# Patient Record
Sex: Male | Born: 1950 | Race: White | Hispanic: No | Smoking: Current every day smoker
Health system: Southern US, Community
[De-identification: ages and names within clinical notes are randomized; demographics above are authoritative.]

## PROBLEM LIST (undated history)

## (undated) DIAGNOSIS — Z87440 Personal history of urinary (tract) infections: Secondary | ICD-10-CM

## (undated) DIAGNOSIS — I10 Essential (primary) hypertension: Secondary | ICD-10-CM

## (undated) DIAGNOSIS — R011 Cardiac murmur, unspecified: Secondary | ICD-10-CM

## (undated) DIAGNOSIS — J189 Pneumonia, unspecified organism: Secondary | ICD-10-CM

## (undated) DIAGNOSIS — G43909 Migraine, unspecified, not intractable, without status migrainosus: Secondary | ICD-10-CM

## (undated) DIAGNOSIS — R351 Nocturia: Secondary | ICD-10-CM

## (undated) DIAGNOSIS — F1010 Alcohol abuse, uncomplicated: Secondary | ICD-10-CM

## (undated) DIAGNOSIS — J449 Chronic obstructive pulmonary disease, unspecified: Secondary | ICD-10-CM

## (undated) DIAGNOSIS — K219 Gastro-esophageal reflux disease without esophagitis: Secondary | ICD-10-CM

## (undated) DIAGNOSIS — K760 Fatty (change of) liver, not elsewhere classified: Secondary | ICD-10-CM

## (undated) DIAGNOSIS — K5792 Diverticulitis of intestine, part unspecified, without perforation or abscess without bleeding: Secondary | ICD-10-CM

## (undated) DIAGNOSIS — I4891 Unspecified atrial fibrillation: Secondary | ICD-10-CM

## (undated) DIAGNOSIS — K921 Melena: Secondary | ICD-10-CM

## (undated) DIAGNOSIS — K589 Irritable bowel syndrome without diarrhea: Secondary | ICD-10-CM

## (undated) DIAGNOSIS — Z720 Tobacco use: Secondary | ICD-10-CM

## (undated) DIAGNOSIS — I509 Heart failure, unspecified: Secondary | ICD-10-CM

## (undated) DIAGNOSIS — J302 Other seasonal allergic rhinitis: Secondary | ICD-10-CM

## (undated) DIAGNOSIS — E222 Syndrome of inappropriate secretion of antidiuretic hormone: Secondary | ICD-10-CM

## (undated) HISTORY — DX: Irritable bowel syndrome, unspecified: K58.9

## (undated) HISTORY — PX: TONSILLECTOMY: SUR1361

## (undated) HISTORY — DX: Melena: K92.1

## (undated) HISTORY — DX: Diverticulitis of intestine, part unspecified, without perforation or abscess without bleeding: K57.92

## (undated) HISTORY — DX: Essential (primary) hypertension: I10

## (undated) HISTORY — PX: COLONOSCOPY: SHX174

---

## 1996-10-28 LAB — HM COLONOSCOPY: HM Colonoscopy: NORMAL

## 2013-09-17 ENCOUNTER — Encounter: Payer: Self-pay | Admitting: Family Medicine

## 2013-09-17 ENCOUNTER — Ambulatory Visit (INDEPENDENT_AMBULATORY_CARE_PROVIDER_SITE_OTHER): Payer: 59 | Admitting: Family Medicine

## 2013-09-17 VITALS — BP 146/80 | HR 107 | Temp 98.2°F | Resp 16 | Ht 69.0 in | Wt 191.0 lb

## 2013-09-17 DIAGNOSIS — K625 Hemorrhage of anus and rectum: Secondary | ICD-10-CM

## 2013-09-17 DIAGNOSIS — R1032 Left lower quadrant pain: Secondary | ICD-10-CM

## 2013-09-17 LAB — BASIC METABOLIC PANEL
BUN: 9 mg/dL (ref 6–23)
CO2: 22 mEq/L (ref 19–32)
Calcium: 9 mg/dL (ref 8.4–10.5)
Chloride: 99 mEq/L (ref 96–112)
Creatinine, Ser: 0.8 mg/dL (ref 0.4–1.5)
Glucose, Bld: 80 mg/dL (ref 70–99)
Potassium: 3.8 mEq/L (ref 3.5–5.1)

## 2013-09-17 LAB — HEPATIC FUNCTION PANEL
ALT: 83 U/L — ABNORMAL HIGH (ref 0–53)
AST: 65 U/L — ABNORMAL HIGH (ref 0–37)
Albumin: 4.5 g/dL (ref 3.5–5.2)
Alkaline Phosphatase: 54 U/L (ref 39–117)
Bilirubin, Direct: 0 mg/dL (ref 0.0–0.3)
Total Bilirubin: 0.9 mg/dL (ref 0.3–1.2)
Total Protein: 7.8 g/dL (ref 6.0–8.3)

## 2013-09-17 LAB — CBC WITH DIFFERENTIAL/PLATELET
Basophils Absolute: 0 10*3/uL (ref 0.0–0.1)
Basophils Relative: 0.4 % (ref 0.0–3.0)
Eosinophils Absolute: 0.2 10*3/uL (ref 0.0–0.7)
HCT: 47.9 % (ref 39.0–52.0)
Hemoglobin: 16.6 g/dL (ref 13.0–17.0)
Lymphs Abs: 1.7 10*3/uL (ref 0.7–4.0)
MCHC: 34.6 g/dL (ref 30.0–36.0)
Monocytes Relative: 6.6 % (ref 3.0–12.0)
Neutro Abs: 9.6 10*3/uL — ABNORMAL HIGH (ref 1.4–7.7)
RBC: 5.13 Mil/uL (ref 4.22–5.81)
RDW: 13.6 % (ref 11.5–14.6)

## 2013-09-17 NOTE — Patient Instructions (Signed)
Schedule your complete physical in 3-6 months We'll notify you of your lab results and make any changes if needed We'll call you with your GI appt Make sure you are eating/drinking regularly Call with any questions or concerns Hang in there!!

## 2013-09-17 NOTE — Progress Notes (Signed)
  Subjective:    Patient ID: Alvin Benton, male    DOB: 11-01-1950, 62 y.o.   MRN: [id number]  HPI Pre visit review using our clinic review tool, if applicable. No additional management support is needed unless otherwise documented below in the visit note.  New to establish.  No previous MD.  IBS- pt last saw a doctor ~3 yrs ago when he had episode of IBS.  Early this week 'felt symptoms of a flare'.  Pt reports he started his 'routine'- laxative cleanse and liquid diet.  Pt noted trace blood in stool on Wednesday and yesterday BRB in bowl.  Had BM this morning w/ no blood.  No abdominal pain.  No hx of diverticulitis, Crohn's, UC.  Pt reports in 1995 he had 'bad episode but kept eating and developed massive abdominal infxn'.  No fevers.  No N/V.  + anorexia.     Review of Systems For ROS see HPI     Objective:   Physical Exam  Vitals reviewed. Constitutional: He appears well-developed and well-nourished. No distress.  HENT:  Head: Normocephalic and atraumatic.  Neck: Normal range of motion. Neck supple.  Cardiovascular: Normal rate, regular rhythm, normal heart sounds and intact distal pulses.   Pulmonary/Chest: Effort normal and breath sounds normal. No respiratory distress. He has no wheezes. He has no rales.  Abdominal: Soft. Bowel sounds are normal. He exhibits no distension. There is no tenderness. There is no rebound and no guarding.  Genitourinary: Rectal exam shows no external hemorrhoid, no internal hemorrhoid, no fissure, no mass and no tenderness. Guaiac positive stool.  Lymphadenopathy:    He has no cervical adenopathy.          Assessment & Plan:

## 2013-09-19 NOTE — Assessment & Plan Note (Signed)
New.  Heme + stool.  No evidence of hemorrhoid or fissure on PE.  Suspect diverticular bleed given pt's description of painless bleed that filled the bowl.  Refer to GI.  Reviewed supportive care and red flags that should prompt return.  Pt expressed understanding and is in agreement w/ plan.

## 2013-09-19 NOTE — Assessment & Plan Note (Signed)
New to provider, ongoing for pt.  No current sxs but did have pain earlier in the week.  Given pt's description of episodic pain, hx of requiring abx w/ improvement of pain- suspect diverticulitis.  Pt denies this but w/out records of previous GI work up.  Will get labs to assess for current infxn.  Refer to GI.  Reviewed supportive care and red flags that should prompt return.  Pt expressed understanding and is in agreement w/ plan.

## 2013-09-20 ENCOUNTER — Other Ambulatory Visit: Payer: Self-pay | Admitting: Family Medicine

## 2013-09-20 DIAGNOSIS — R945 Abnormal results of liver function studies: Secondary | ICD-10-CM

## 2013-09-28 ENCOUNTER — Encounter: Payer: Self-pay | Admitting: Internal Medicine

## 2013-09-28 ENCOUNTER — Ambulatory Visit (INDEPENDENT_AMBULATORY_CARE_PROVIDER_SITE_OTHER): Payer: 59 | Admitting: Internal Medicine

## 2013-09-28 VITALS — BP 160/100 | HR 92 | Ht 69.0 in | Wt 190.0 lb

## 2013-09-28 DIAGNOSIS — R1032 Left lower quadrant pain: Secondary | ICD-10-CM

## 2013-09-28 DIAGNOSIS — K625 Hemorrhage of anus and rectum: Secondary | ICD-10-CM

## 2013-09-28 MED ORDER — NA SULFATE-K SULFATE-MG SULF 17.5-3.13-1.6 GM/177ML PO SOLN: ORAL | Status: DC

## 2013-09-28 NOTE — Assessment & Plan Note (Addendum)
?   IBS, diverticular, abdominal migraine Unusual monthly episodes that spontaneously resolve  Await colonoscopy Chronicity speaks to benign nature though he says more frequent and with bleeding neoplasm possible Does have a history of migraines this is atypical for an abdominal migraine

## 2013-09-28 NOTE — Patient Instructions (Signed)

## 2013-09-28 NOTE — Assessment & Plan Note (Signed)
Needs colonoscopy evaluation The risks and benefits as well as alternatives of endoscopic procedure(s) have been discussed and reviewed. All questions answered. The patient agrees to proceed.

## 2013-09-28 NOTE — Progress Notes (Signed)
Referred by: Sheliah Hatch, MD   Subjective:    Patient ID: Alvin Benton, male    DOB: 01/13/1951, 62 y.o.   MRN: [id number]  HPI The patient is a very nice middle-aged white man with a long history of intermittent left lower quadrant pain. It starts is and she says he develops a feels like a blockage and he has pain and he goes on liquid diet and uses laxatives for a few days and then gets better. Sig had an episode but he passed bright red blood per rectum which is new. He says that he has tried to treat this with different laxatives etc. but is really never been successful for the most part he feels well except for these episodes. He had a colonoscopy 20 years ago in [place] as well as multiple other workups with imaging it sounds like that was unrevealing. He says her brother has a similar problem. He has been told with IBS. He's also been told it might be diverticulitis. He thinks his frequency of attacks may be increasing. He cannot identify obvious triggers. No Known Allergies No outpatient prescriptions prior to visit.   No facility-administered medications prior to visit.   Past Medical History  Diagnosis Date  . Blood in stool   . Hx of migraines   . IBS (irritable bowel syndrome)   . Diverticulitis    Past Surgical History  Procedure Laterality Date  . Colonoscopy     History   Social History  . Marital Status: Married    Spouse Name: N/A    Number of Children: 1  . Years of Education: N/A   Occupational History  . Inventory     Social History Main Topics  . Smoking status: Current Every Day Smoker -- 1.00 packs/day for 40 years    Types: Cigarettes  . Smokeless tobacco: Never Used  . Alcohol Use: No  . Drug Use: No  . Sexual Activity: No   Other Topics Concern  . None   Social History Narrative   Area and one daughter   Regulatory affairs officer at [employer]   One caffeinated beverage a day   Family History  Problem Relation Age of Onset    . Stroke Mother    Review of Systems As per history of present illness all other view of systems negative    Objective:   Physical Exam General:  Well-developed, well-nourished and in no acute distress Eyes:  anicteric. ENT:   Mouth and posterior pharynx free of lesions.  Neck:   supple w/o thyromegaly or mass.  Lungs: Clear to auscultation bilaterally. Heart:  S1S2, no rubs, murmurs, gallops. Abdomen:  soft, non-tender, no hepatosplenomegaly, hernia, or mass and BS+.  Rectal: deferred Lymph:  no cervical or supraclavicular adenopathy. Extremities:   no edema Skin   no rash. Neuro:  A&O x 3.  Psych:  appropriate mood and  Affect.   Data Reviewed: Primary care note- had heme positive stool, a 09/17/2013 Lab Results  Component Value Date   WBC 12.3* 09/17/2013   HGB 16.6 09/17/2013   HCT 47.9 09/17/2013   MCV 93.4 09/17/2013   PLT 426.0* 09/17/2013     Chemistry      Component Value Date/Time   NA 134* 09/17/2013 1334   K 3.8 09/17/2013 1334   CL 99 09/17/2013 1334   CO2 22 09/17/2013 1334   BUN 9 09/17/2013 1334   CREATININE 0.8 09/17/2013 1334      Component Value Date/Time  CALCIUM 9.0 09/17/2013 1334   ALKPHOS 54 09/17/2013 1334   AST 65* 09/17/2013 1334   ALT 83* 09/17/2013 1334   BILITOT 0.9 09/17/2013 1334        Assessment & Plan:   1. BRBPR (bright red blood per rectum)   2. LLQ pain-chronic, intermittent and episodic    I appreciate the opportunity to care for this patient. CC: Neena Rhymes, MD

## 2013-10-04 ENCOUNTER — Other Ambulatory Visit (INDEPENDENT_AMBULATORY_CARE_PROVIDER_SITE_OTHER): Payer: 59

## 2013-10-04 DIAGNOSIS — R7989 Other specified abnormal findings of blood chemistry: Secondary | ICD-10-CM

## 2013-10-04 DIAGNOSIS — R945 Abnormal results of liver function studies: Secondary | ICD-10-CM

## 2013-10-04 LAB — HEPATIC FUNCTION PANEL
Albumin: 4.2 g/dL (ref 3.5–5.2)
Alkaline Phosphatase: 43 U/L (ref 39–117)
Bilirubin, Direct: 0 mg/dL (ref 0.0–0.3)
Total Bilirubin: 0.3 mg/dL (ref 0.3–1.2)

## 2013-10-05 ENCOUNTER — Encounter: Payer: Self-pay | Admitting: *Deleted

## 2013-10-05 ENCOUNTER — Telehealth: Payer: Self-pay

## 2013-10-05 NOTE — Telephone Encounter (Signed)
Message copied by Wende Mott on Tue Oct 05, 2013  8:22 AM ------      Message from: Sheliah Hatch      Created: Mon Oct 04, 2013  4:48 PM       LFTs are improving- this is good news! ------

## 2013-10-05 NOTE — Telephone Encounter (Signed)
Opened in error

## 2013-11-09 ENCOUNTER — Ambulatory Visit: Payer: 59 | Admitting: Internal Medicine

## 2013-11-15 ENCOUNTER — Ambulatory Visit (AMBULATORY_SURGERY_CENTER): Payer: 59 | Admitting: Internal Medicine

## 2013-11-15 ENCOUNTER — Encounter: Payer: Self-pay | Admitting: Internal Medicine

## 2013-11-15 VITALS — BP 156/84 | HR 76 | Temp 98.8°F | Resp 18 | Ht 69.0 in | Wt 190.0 lb

## 2013-11-15 DIAGNOSIS — K625 Hemorrhage of anus and rectum: Secondary | ICD-10-CM

## 2013-11-15 DIAGNOSIS — K648 Other hemorrhoids: Secondary | ICD-10-CM

## 2013-11-15 DIAGNOSIS — R1032 Left lower quadrant pain: Secondary | ICD-10-CM

## 2013-11-15 DIAGNOSIS — K5730 Diverticulosis of large intestine without perforation or abscess without bleeding: Secondary | ICD-10-CM

## 2013-11-15 MED ORDER — HYOSCYAMINE SULFATE 0.125 MG SL SUBL: 0.1250 mg | SUBLINGUAL_TABLET | SUBLINGUAL | Status: DC | PRN

## 2013-11-15 MED ORDER — SODIUM CHLORIDE 0.9 % IV SOLN: 500.0000 mL | INTRAVENOUS | Status: DC

## 2013-11-15 NOTE — Progress Notes (Signed)
BP elevated.  Pt evaluated per Azucena KubaJ Nulty CRNA and ok for procedure

## 2013-11-15 NOTE — Progress Notes (Signed)
No complaints noted in the recovery room. Maw   

## 2013-11-15 NOTE — Patient Instructions (Addendum)
I found diverticulosis and small internal hemorrhoids. Otherwise ok.  Please try the hyoscyamine I have prescribed if/when you get any more pain attacks (relaxes colon spasms).  If you have new or persistent problems see me again.  Please follow-up with Dr. Beverely Low re: blood pressure.  Next routine colonoscopy in 10 years - 2025  I appreciate the opportunity to care for you. Iva Boop, MD, FACG     YOU HAD AN ENDOSCOPIC PROCEDURE TODAY AT THE [facility]: Refer to the procedure report that was given to you for any specific questions about what was found during the examination.  If the procedure report does not answer your questions, please call your gastroenterologist to clarify.  If you requested that your care partner not be given the details of your procedure findings, then the procedure report has been included in a sealed envelope for you to review at your convenience later.  YOU SHOULD EXPECT: Some feelings of bloating in the abdomen. Passage of more gas than usual.  Walking can help get rid of the air that was put into your GI tract during the procedure and reduce the bloating. If you had a lower endoscopy (such as a colonoscopy or flexible sigmoidoscopy) you may notice spotting of blood in your stool or on the toilet paper. If you underwent a bowel prep for your procedure, then you may not have a normal bowel movement for a few days.  DIET: Your first meal following the procedure should be a light meal and then it is ok to progress to your normal diet.  A half-sandwich or bowl of soup is an example of a good first meal.  Heavy or fried foods are harder to digest and may make you feel nauseous or bloated.  Likewise meals heavy in dairy and vegetables can cause extra gas to form and this can also increase the bloating.  Drink plenty of fluids but you should avoid alcoholic beverages for 24 hours.  ACTIVITY: Your care partner should take you home directly after the  procedure.  You should plan to take it easy, moving slowly for the rest of the day.  You can resume normal activity the day after the procedure however you should NOT DRIVE or use heavy machinery for 24 hours (because of the sedation medicines used during the test).    SYMPTOMS TO REPORT IMMEDIATELY: A gastroenterologist can be reached at any hour.  During normal business hours, 8:30 AM to 5:00 PM Monday through Friday, call [phone].  After hours and on weekends, please call the GI answering service at [phone] who will take a message and have the physician on call contact you.   Following lower endoscopy (colonoscopy or flexible sigmoidoscopy):  Excessive amounts of blood in the stool  Significant tenderness or worsening of abdominal pains  Swelling of the abdomen that is new, acute  Fever of 100F or higher    FOLLOW UP: If any biopsies were taken you will be contacted by phone or by letter within the next 1-3 weeks.  Call your gastroenterologist if you have not heard about the biopsies in 3 weeks.  Our staff will call the home number listed on your records the next business day following your procedure to check on you and address any questions or concerns that you may have at that time regarding the information given to you following your procedure. This is a courtesy call and so if there is no answer at the home number and  we have not heard from you through the emergency physician on call, we will assume that you have returned to your regular daily activities without incident.  SIGNATURES/CONFIDENTIALITY: You and/or your care partner have signed paperwork which will be entered into your electronic medical record.  These signatures attest to the fact that that the information above on your After Visit Summary has been reviewed and is understood.  Full responsibility of the confidentiality of this discharge information lies with you and/or your care-partner.    Handouts were  given to your care partner on diverticulosis and hemorrhoids. You may resume your current medications today. Please call if any questions or concerns.

## 2013-11-15 NOTE — Progress Notes (Signed)
A/ox3 pleased with MAC, report to Annette RN 

## 2013-11-15 NOTE — Progress Notes (Signed)
I gave the pt a printed list of vital signs to show his primary care md, Dr. Beverely Lowabori. maw

## 2013-11-15 NOTE — Op Note (Signed)
[facility] [address] [place], [id number]   COLONOSCOPY PROCEDURE REPORT  PATIENT: Alvin Benton, Mutasim  MR#: [id number] BIRTHDATE: 1951/09/02 , 62  yrs. old GENDER: Male ENDOSCOPIST: Iva Booparl E Gessner, MD, [facility] REFERRED XB:JYNWGNFAOBY:Katherine Beverely Lowabori, M.D. PROCEDURE DATE:  11/15/2013 PROCEDURE:   Colonoscopy, diagnostic First Screening Colonoscopy - Avg.  risk and is 50 yrs.  old or older - No.  Prior Negative Screening - Now for repeat screening. N/A  History of Adenoma - Now for follow-up colonoscopy & has been > or = to 3 yrs.  N/A  Polyps Removed Today? No.  Recommend repeat exam, <10 yrs? No. ASA CLASS:   Class II INDICATIONS:Rectal Bleeding. MEDICATIONS: Propofol (Diprivan) 370 mg IV, MAC sedation, administered by CRNA, and These medications were titrated to patient response per physician's verbal order  DESCRIPTION OF PROCEDURE:   After the risks benefits and alternatives of the procedure were thoroughly explained, informed consent was obtained.  A digital rectal exam revealed no abnormalities of the rectum.   The LB [id number]  endoscope was introduced through the anus and advanced to the terminal ileum which was intubated for a short distance. No adverse events experienced.   The quality of the prep was Suprep adequate  The instrument was then slowly withdrawn as the colon was fully examined.  COLON FINDINGS: Moderate diverticulosis was noted in the sigmoid colon.   The colon mucosa was otherwise normal.   The mucosa appeared normal in the terminal ileum.  Retroflexed views revealed internal hemorrhoids. The time to cecum=1 minutes 59 seconds. Withdrawal time=9 minutes 35 seconds.  The scope was withdrawn and the procedure completed. COMPLICATIONS: There were no complications.  ENDOSCOPIC IMPRESSION: 1.   Moderate diverticulosis was noted in the sigmoid colon 2.   The colon mucosa was otherwise normal 3.   Normal mucosa in the terminal ileum 4.   Small  internal hemorrhoids - suspected source of bleeding RECOMMENDATIONS: 1.  Repeat colonoscopy 10 years. 2.   hyoscyamine 0.125 ng as needed for LLQ pain Rx sent =- see Dr. Raul DelGessnera agin if recurrent/persistent bleedin/pain problems 3.   f/u PCP re: elevated BP today (? pre-procedure anxiety vs. underlyig  hypertension)   eSigned:  Iva Booparl E Gessner, MD, [facility] 11/15/2013 1:54 PM  cc: Neena Rhymesabori, Katherine MD and The Patient

## 2013-11-16 ENCOUNTER — Telehealth: Payer: Self-pay | Admitting: *Deleted

## 2013-11-16 NOTE — Telephone Encounter (Signed)
  Follow up Call-  Call back number 11/15/2013 11/15/2013  Post procedure Call Back phone  # [phone]  Permission to leave phone message Yes Yes     Patient questions:  Do you have a fever, pain , or abdominal swelling? no Pain Score  0 *  Have you tolerated food without any problems? yes  Have you been able to return to your normal activities? yes  Do you have any questions about your discharge instructions: Diet   no Medications  no Follow up visit  no  Do you have questions or concerns about your Care? no  Actions: * If pain score is 4 or above: No action needed, pain <4.

## 2013-11-25 ENCOUNTER — Ambulatory Visit: Payer: Self-pay | Admitting: Family Medicine

## 2014-01-11 ENCOUNTER — Telehealth: Payer: Self-pay

## 2014-01-11 NOTE — Telephone Encounter (Signed)
Medication List and allergies:  Updated and Reviewed  90 day supply/mail order: n/a Local prescriptions:  [id number] [place], [place] - [address]  Immunization due:  Influenza-declined, Td-DUE, Zoster-DUE  A/P: No changes to personal, family or PSH Flu-declined Tdap- greater than 10 years ago Shingles- DUE [facility]CS- 11/15/13-diverticulosis, internal hemorrhoids; repeat in 10 years 10/2023 PSA- not on file   To discuss with provider: Zoster vaccine

## 2014-01-12 ENCOUNTER — Ambulatory Visit (INDEPENDENT_AMBULATORY_CARE_PROVIDER_SITE_OTHER): Payer: 59 | Admitting: Family Medicine

## 2014-01-12 ENCOUNTER — Encounter: Payer: Self-pay | Admitting: Family Medicine

## 2014-01-12 VITALS — BP 152/100 | HR 77 | Temp 98.2°F | Resp 16 | Ht 69.0 in | Wt 197.0 lb

## 2014-01-12 DIAGNOSIS — M79642 Pain in left hand: Secondary | ICD-10-CM | POA: Insufficient documentation

## 2014-01-12 DIAGNOSIS — I10 Essential (primary) hypertension: Secondary | ICD-10-CM

## 2014-01-12 DIAGNOSIS — M79609 Pain in unspecified limb: Secondary | ICD-10-CM

## 2014-01-12 MED ORDER — VALSARTAN 80 MG PO TABS: 80.0000 mg | ORAL_TABLET | Freq: Every day | ORAL | Status: DC

## 2014-01-12 MED ORDER — MELOXICAM 15 MG PO TABS: 15.0000 mg | ORAL_TABLET | Freq: Every day | ORAL | Status: DC

## 2014-01-12 NOTE — Patient Instructions (Signed)
Follow up in 3-5 weeks to recheck BP Start the Valsartan daily for blood pressure We'll call you with your hand specialist appt We'll notify you of your lab results and make any changes if needed Start the Mobic once daily for the pain- don't add any additional ibuprofen (you can take tylenol as needed) Call with any questions or concerns Hang in there!

## 2014-01-12 NOTE — Assessment & Plan Note (Signed)
New.  Concern that pt tore his L 4th finger flexor tendon.  Refer to hand specialist.  Start Mobic daily.  Will follow.

## 2014-01-12 NOTE — Progress Notes (Signed)
   Subjective:    Patient ID: Alvin Benton, male    DOB: 04-19-1951, 63 y.o.   MRN: [id number]  HPI HTN-  Pt's BP was elevated at 1st OV and again at colonoscopy.  Pt reports he was previously on Toprol and was unable to afford med b/c it was not generic at the time.  They almost refused the colonoscopy due to elevated BP.  + family hx of HTN (mom).  + tobacco use.  Pt has limited his beer intake.  L hand pain- [place] airport [place] and broke fall w/ L hand and now has 4th finger pain and index MCP joint pain.  Unable to fully flex 4th finger and having constant aching in the knuckle of index finger.  Taking ibuprofen and tylenol w/o relief.  Icing w/ temporary improvement.   Review of Systems For ROS see HPI     Objective:   Physical Exam  Vitals reviewed. Constitutional: He is oriented to person, place, and time. He appears well-developed and well-nourished. No distress.  HENT:  Head: Normocephalic and atraumatic.  Eyes: Conjunctivae and EOM are normal. Pupils are equal, round, and reactive to light.  Neck: Normal range of motion. Neck supple. No thyromegaly present.  Cardiovascular: Normal rate, regular rhythm, normal heart sounds and intact distal pulses.   No murmur heard. Pulmonary/Chest: Effort normal and breath sounds normal. No respiratory distress.  Abdominal: Soft. Bowel sounds are normal. He exhibits no distension.  Musculoskeletal: He exhibits no edema.  Decreased flexion of L 4th finger w/ palpable soft tissue mass just inferior to PIP joint  Lymphadenopathy:    He has no cervical adenopathy.  Neurological: He is alert and oriented to person, place, and time. No cranial nerve deficit.  Tremor of L hand  Skin: Skin is warm and dry.  Psychiatric: He has a normal mood and affect. His behavior is normal.          Assessment & Plan:

## 2014-01-12 NOTE — Progress Notes (Signed)
Pre visit review using our clinic review tool, if applicable. No additional management support is needed unless otherwise documented below in the visit note. 

## 2014-01-12 NOTE — Assessment & Plan Note (Signed)
New.  Pt's BP has been elevated on multiple occasions and discovered that he has previously been on medication.  Start ARB.  Get baseline labs and follow closely.  Reviewed the importance of smoking cessation, limiting alcohol, and regular exercise.

## 2014-01-13 ENCOUNTER — Encounter: Payer: Self-pay | Admitting: General Practice

## 2014-01-13 ENCOUNTER — Telehealth: Payer: Self-pay | Admitting: Family Medicine

## 2014-01-13 LAB — BASIC METABOLIC PANEL
BUN: 10 mg/dL (ref 6–23)
CALCIUM: 9.4 mg/dL (ref 8.4–10.5)
CO2: 25 mEq/L (ref 19–32)
Chloride: 101 mEq/L (ref 96–112)
Creatinine, Ser: 1 mg/dL (ref 0.4–1.5)
GFR: 84.66 mL/min (ref >60.00)
Glucose, Bld: 73 mg/dL (ref 70–99)
Potassium: 4.2 mEq/L (ref 3.5–5.1)
Sodium: 138 mEq/L (ref 135–145)

## 2014-01-13 LAB — CBC WITH DIFFERENTIAL/PLATELET
BASOS ABS: 0 10*3/uL (ref 0.0–0.1)
Basophils Relative: 0.3 % (ref 0.0–3.0)
EOS PCT: 2.1 % (ref 0.0–5.0)
Eosinophils Absolute: 0.2 10*3/uL (ref 0.0–0.7)
HCT: 49.2 % (ref 39.0–52.0)
HEMOGLOBIN: 16.3 g/dL (ref 13.0–17.0)
LYMPHS PCT: 19.1 % (ref 12.0–46.0)
Lymphs Abs: 1.7 10*3/uL (ref 0.7–4.0)
MCHC: 33.1 g/dL (ref 30.0–36.0)
MCV: 95.9 fl (ref 78.0–100.0)
Monocytes Absolute: 0.5 10*3/uL (ref 0.1–1.0)
Monocytes Relative: 5.5 % (ref 3.0–12.0)
Neutro Abs: 6.3 10*3/uL (ref 1.4–7.7)
Neutrophils Relative %: 73 % (ref 43.0–77.0)
Platelets: 391 10*3/uL (ref 150.0–400.0)
RBC: 5.13 Mil/uL (ref 4.22–5.81)
RDW: 13.5 % (ref 11.5–14.6)
WBC: 8.7 10*3/uL (ref 4.5–10.5)

## 2014-01-13 LAB — HEPATIC FUNCTION PANEL
ALBUMIN: 4.3 g/dL (ref 3.5–5.2)
ALK PHOS: 41 U/L (ref 39–117)
ALT: 47 U/L (ref 0–53)
AST: 38 U/L — ABNORMAL HIGH (ref 0–37)
Bilirubin, Direct: 0 mg/dL (ref 0.0–0.3)
TOTAL PROTEIN: 7.1 g/dL (ref 6.0–8.3)
Total Bilirubin: 0.5 mg/dL (ref 0.3–1.2)

## 2014-01-13 LAB — TSH: TSH: 2.09 u[IU]/mL (ref 0.35–5.50)

## 2014-01-13 NOTE — Telephone Encounter (Signed)
Relevant patient education assigned to patient using Emmi. ° °

## 2014-02-09 ENCOUNTER — Ambulatory Visit (INDEPENDENT_AMBULATORY_CARE_PROVIDER_SITE_OTHER): Payer: 59 | Admitting: Family Medicine

## 2014-02-09 ENCOUNTER — Encounter: Payer: Self-pay | Admitting: Family Medicine

## 2014-02-09 VITALS — BP 160/90 | HR 71 | Temp 98.2°F | Resp 16 | Wt 199.0 lb

## 2014-02-09 DIAGNOSIS — I10 Essential (primary) hypertension: Secondary | ICD-10-CM

## 2014-02-09 LAB — BASIC METABOLIC PANEL
BUN: 10 mg/dL (ref 6–23)
CHLORIDE: 100 meq/L (ref 96–112)
CO2: 27 meq/L (ref 19–32)
CREATININE: 0.9 mg/dL (ref 0.4–1.5)
Calcium: 9.5 mg/dL (ref 8.4–10.5)
GFR: 86.72 mL/min (ref >60.00)
Glucose, Bld: 69 mg/dL — ABNORMAL LOW (ref 70–99)
Potassium: 3.6 mEq/L (ref 3.5–5.1)
Sodium: 136 mEq/L (ref 135–145)

## 2014-02-09 MED ORDER — VALSARTAN 160 MG PO TABS: 160.0000 mg | ORAL_TABLET | Freq: Every day | ORAL | Status: DC

## 2014-02-09 NOTE — Progress Notes (Signed)
   Subjective:    Patient ID: Roselee NovaScott Marcantel, male    DOB: [birth year], 63 y.o.   MRN: [id number]  HPI HTN- chronic problem.  Started Diovan at last visit.  DBP has improved but SBP is higher than previous.  Denies increased stress prior to visit.  No CP, SOB, HAs, visual changes, edema.   Review of Systems For ROS see HPI     Objective:   Physical Exam  Constitutional: He is oriented to person, place, and time. He appears well-developed and well-nourished. No distress.  HENT:  Head: Normocephalic and atraumatic.  Eyes: Conjunctivae and EOM are normal. Pupils are equal, round, and reactive to light.  Neck: Normal range of motion. Neck supple. No thyromegaly present.  Cardiovascular: Normal rate, regular rhythm and intact distal pulses.   Murmur (I/VI SEM heard best at RUSB) heard. Pulmonary/Chest: Effort normal and breath sounds normal. No respiratory distress.  Abdominal: Soft. Bowel sounds are normal. He exhibits no distension.  Musculoskeletal: He exhibits no edema.  Lymphadenopathy:    He has no cervical adenopathy.  Neurological: He is alert and oriented to person, place, and time. No cranial nerve deficit.  Skin: Skin is warm and dry.  Psychiatric: He has a normal mood and affect. His behavior is normal.          Assessment & Plan:

## 2014-02-09 NOTE — Assessment & Plan Note (Signed)
Unchanged.  Increase dose of Diovan. Check BMP.  Reviewed supportive care and red flags that should prompt return.  Pt expressed understanding and is in agreement w/ plan.

## 2014-02-09 NOTE — Progress Notes (Signed)
Pre visit review using our clinic review tool, if applicable. No additional management support is needed unless otherwise documented below in the visit note. 

## 2014-02-09 NOTE — Patient Instructions (Signed)
Recheck BP in 6 weeks We'll notify you of your lab results Increase to 160mg  daily- 2 of what you have at home and 1 of the new prescription Call with any questions or concerns Happy Early Birthday!!

## 2014-02-10 ENCOUNTER — Encounter: Payer: Self-pay | Admitting: General Practice

## 2014-03-23 ENCOUNTER — Encounter: Payer: Self-pay | Admitting: Family Medicine

## 2014-03-23 ENCOUNTER — Ambulatory Visit (INDEPENDENT_AMBULATORY_CARE_PROVIDER_SITE_OTHER): Payer: 59 | Admitting: Family Medicine

## 2014-03-23 ENCOUNTER — Encounter: Payer: Self-pay | Admitting: General Practice

## 2014-03-23 VITALS — BP 140/90 | HR 78 | Temp 98.0°F | Resp 16 | Wt 197.4 lb

## 2014-03-23 DIAGNOSIS — I10 Essential (primary) hypertension: Secondary | ICD-10-CM

## 2014-03-23 LAB — BASIC METABOLIC PANEL
BUN: 12 mg/dL (ref 6–23)
CO2: 25 mEq/L (ref 19–32)
Calcium: 9.6 mg/dL (ref 8.4–10.5)
Chloride: 103 mEq/L (ref 96–112)
Creatinine, Ser: 1 mg/dL (ref 0.4–1.5)
GFR: 82.62 mL/min (ref >60.00)
GLUCOSE: 112 mg/dL — AB (ref 70–99)
Potassium: 4.4 mEq/L (ref 3.5–5.1)
Sodium: 136 mEq/L (ref 135–145)

## 2014-03-23 NOTE — Assessment & Plan Note (Signed)
Improved since last visit w/ increased dose of Diovan.  Continue current dose w/ close monitoring.  Again encouraged pt to stop smoking.  Check BMP.  Will follow.

## 2014-03-23 NOTE — Progress Notes (Signed)
Pre visit review using our clinic review tool, if applicable. No additional management support is needed unless otherwise documented below in the visit note. 

## 2014-03-23 NOTE — Progress Notes (Signed)
   Subjective:    Patient ID: Alvin Benton, male    DOB: Sep 18, 1951, 63 y.o.   MRN: [id number]  HPI HTN- chronic problem, now on 160mg  of Diovan.  No CP, SOB, HAs, visual changes, edema.   Review of Systems For ROS see HPI     Objective:   Physical Exam  Constitutional: He is oriented to person, place, and time. He appears well-developed and well-nourished. No distress.  HENT:  Head: Normocephalic and atraumatic.  Eyes: Conjunctivae and EOM are normal. Pupils are equal, round, and reactive to light.  Neck: Normal range of motion. Neck supple. No thyromegaly present.  Cardiovascular: Normal rate, regular rhythm, normal heart sounds and intact distal pulses.   No murmur heard. Pulmonary/Chest: Effort normal and breath sounds normal. No respiratory distress.  Abdominal: Soft. Bowel sounds are normal. He exhibits no distension.  Musculoskeletal: He exhibits no edema.  Lymphadenopathy:    He has no cervical adenopathy.  Neurological: He is alert and oriented to person, place, and time. No cranial nerve deficit.  Skin: Skin is warm and dry.  Psychiatric: He has a normal mood and affect. His behavior is normal.          Assessment & Plan:

## 2014-03-23 NOTE — Patient Instructions (Signed)
Please schedule your complete physical in 6 months Continue the Diovan 160mg  daily We'll notify you of your lab results and make any changes if needed Call with any questions or concerns Have a great summer!!!

## 2014-06-16 ENCOUNTER — Other Ambulatory Visit: Payer: Self-pay | Admitting: Family Medicine

## 2014-06-16 NOTE — Telephone Encounter (Signed)
Med filled.  

## 2014-06-21 ENCOUNTER — Other Ambulatory Visit: Payer: Self-pay | Admitting: Family Medicine

## 2014-06-21 NOTE — Telephone Encounter (Signed)
Med filled CPE in November.  

## 2014-07-27 ENCOUNTER — Other Ambulatory Visit: Payer: Self-pay | Admitting: General Practice

## 2014-07-27 MED ORDER — VALSARTAN 160 MG PO TABS: ORAL_TABLET | ORAL | Status: DC

## 2014-08-01 ENCOUNTER — Other Ambulatory Visit: Payer: Self-pay | Admitting: General Practice

## 2014-08-01 MED ORDER — VALSARTAN 160 MG PO TABS: ORAL_TABLET | ORAL | Status: DC

## 2014-09-26 ENCOUNTER — Encounter: Payer: 59 | Admitting: Family Medicine

## 2014-11-04 ENCOUNTER — Telehealth: Payer: Self-pay | Admitting: Family Medicine

## 2014-11-04 MED ORDER — VALSARTAN 160 MG PO TABS: ORAL_TABLET | ORAL | Status: DC

## 2014-11-04 NOTE — Telephone Encounter (Signed)
Caller name: Roselee NovaSarver, Tayshaun Relation to pt: self  Call back number: [phone] [facility]  [id number] [place] - [address] [phone] (Phone)   Reason for call:   Pt is requesting a refill for valsartan (DIOVAN) 160 MG tablet. Insurance has changed [employer] Member ID # [id number]  Group# [id number]

## 2014-11-04 NOTE — Telephone Encounter (Signed)
Med filled. Letter mailed to pt to schedule a CPE.  

## 2014-11-17 ENCOUNTER — Other Ambulatory Visit: Payer: Self-pay | Admitting: General Practice

## 2014-11-17 MED ORDER — VALSARTAN 160 MG PO TABS: ORAL_TABLET | ORAL | Status: DC

## 2015-02-02 ENCOUNTER — Telehealth: Payer: Self-pay | Admitting: Family Medicine

## 2015-02-02 NOTE — Telephone Encounter (Signed)
Pre Visit letter sent  °

## 2015-02-22 ENCOUNTER — Telehealth: Payer: Self-pay

## 2015-02-22 NOTE — Telephone Encounter (Signed)
Attempted pre visit call. Patient had to cancel due to being [place] of [place] on business

## 2015-02-23 ENCOUNTER — Encounter: Payer: Self-pay | Admitting: Family Medicine

## 2015-04-11 ENCOUNTER — Other Ambulatory Visit: Payer: Self-pay | Admitting: Family Medicine

## 2015-04-11 NOTE — Telephone Encounter (Signed)
Med filled for only #30. Pt needs a BP follow up.  

## 2015-05-09 ENCOUNTER — Other Ambulatory Visit: Payer: Self-pay | Admitting: Family Medicine

## 2015-05-09 NOTE — Telephone Encounter (Signed)
Last OV 03-13-14 Med denied, pt needs BP follow up appt.

## 2015-05-16 ENCOUNTER — Other Ambulatory Visit: Payer: Self-pay | Admitting: Family Medicine

## 2015-05-16 NOTE — Telephone Encounter (Signed)
Med denied, pt needs a BP follow up.

## 2015-05-20 ENCOUNTER — Other Ambulatory Visit: Payer: Self-pay | Admitting: Family Medicine

## 2015-05-22 NOTE — Telephone Encounter (Signed)
30 day supply of valsartan e-scribed to [facility]. Pt needs OV for any future refills. JG//CMA

## 2015-06-15 ENCOUNTER — Other Ambulatory Visit: Payer: Self-pay | Admitting: Family Medicine

## 2015-06-15 NOTE — Telephone Encounter (Signed)
Last OV 03/23/14 BP meds last filled 05/22/15 #30 with 0  Note left on prescription to make BP follow up, and letter mailed on 6/24/6 to make BP appt. NO UPCOMING APPTS   Please advise?

## 2015-06-19 ENCOUNTER — Other Ambulatory Visit: Payer: Self-pay | Admitting: Family Medicine

## 2015-06-20 NOTE — Telephone Encounter (Signed)
Med denied, pt needs a BP follow up appt before refills can be given.

## 2015-07-06 ENCOUNTER — Telehealth: Payer: Self-pay | Admitting: Family Medicine

## 2015-07-06 MED ORDER — VALSARTAN 160 MG PO TABS: 160.0000 mg | ORAL_TABLET | Freq: Every day | ORAL | Status: DC

## 2015-07-06 NOTE — Telephone Encounter (Signed)
Medication filled to pharmacy as requested.   

## 2015-07-06 NOTE — Telephone Encounter (Signed)
[facility] [address]  Pt has been out of valsartan for 3 days. He has appt scheduled 07/17/15. Please call in for him til appt.

## 2015-07-17 ENCOUNTER — Encounter: Payer: Self-pay | Admitting: Family Medicine

## 2015-07-17 ENCOUNTER — Ambulatory Visit (INDEPENDENT_AMBULATORY_CARE_PROVIDER_SITE_OTHER): Payer: BLUE CROSS/BLUE SHIELD | Admitting: Family Medicine

## 2015-07-17 VITALS — BP 138/88 | HR 98 | Temp 98.4°F | Resp 17 | Ht 69.0 in | Wt 195.0 lb

## 2015-07-17 DIAGNOSIS — I10 Essential (primary) hypertension: Secondary | ICD-10-CM | POA: Diagnosis not present

## 2015-07-17 DIAGNOSIS — J302 Other seasonal allergic rhinitis: Secondary | ICD-10-CM

## 2015-07-17 DIAGNOSIS — E663 Overweight: Secondary | ICD-10-CM

## 2015-07-17 DIAGNOSIS — Z720 Tobacco use: Secondary | ICD-10-CM | POA: Diagnosis not present

## 2015-07-17 DIAGNOSIS — J4520 Mild intermittent asthma, uncomplicated: Secondary | ICD-10-CM | POA: Diagnosis not present

## 2015-07-17 DIAGNOSIS — J45909 Unspecified asthma, uncomplicated: Secondary | ICD-10-CM | POA: Insufficient documentation

## 2015-07-17 DIAGNOSIS — F17200 Nicotine dependence, unspecified, uncomplicated: Secondary | ICD-10-CM | POA: Insufficient documentation

## 2015-07-17 DIAGNOSIS — J309 Allergic rhinitis, unspecified: Secondary | ICD-10-CM | POA: Insufficient documentation

## 2015-07-17 LAB — CBC WITH DIFFERENTIAL/PLATELET
BASOS PCT: 0.5 % (ref 0.0–3.0)
Basophils Absolute: 0.1 10*3/uL (ref 0.0–0.1)
EOS ABS: 0.1 10*3/uL (ref 0.0–0.7)
Eosinophils Relative: 1 % (ref 0.0–5.0)
HCT: 47.4 % (ref 39.0–52.0)
Hemoglobin: 16.1 g/dL (ref 13.0–17.0)
Lymphocytes Relative: 13.8 % (ref 12.0–46.0)
Lymphs Abs: 1.4 10*3/uL (ref 0.7–4.0)
MCHC: 33.9 g/dL (ref 30.0–36.0)
MCV: 98.6 fl (ref 78.0–100.0)
MONO ABS: 1 10*3/uL (ref 0.1–1.0)
Monocytes Relative: 9.7 % (ref 3.0–12.0)
NEUTROS ABS: 7.8 10*3/uL — AB (ref 1.4–7.7)
Neutrophils Relative %: 75 % (ref 43.0–77.0)
PLATELETS: 337 10*3/uL (ref 150.0–400.0)
RBC: 4.81 Mil/uL (ref 4.22–5.81)
RDW: 14.3 % (ref 11.5–15.5)
WBC: 10.4 10*3/uL (ref 4.0–10.5)

## 2015-07-17 LAB — HEPATIC FUNCTION PANEL
ALK PHOS: 46 U/L (ref 39–117)
ALT: 65 U/L — AB (ref 0–53)
AST: 54 U/L — ABNORMAL HIGH (ref 0–37)
Albumin: 4.4 g/dL (ref 3.5–5.2)
BILIRUBIN DIRECT: 0.1 mg/dL (ref 0.0–0.3)
Total Bilirubin: 0.4 mg/dL (ref 0.2–1.2)
Total Protein: 7.8 g/dL (ref 6.0–8.3)

## 2015-07-17 LAB — LIPID PANEL
Cholesterol: 213 mg/dL — ABNORMAL HIGH (ref 0–200)
HDL: 90.4 mg/dL (ref >39.00)
LDL Cholesterol: 105 mg/dL — ABNORMAL HIGH (ref 0–99)
NONHDL: 122.53
Total CHOL/HDL Ratio: 2
Triglycerides: 88 mg/dL (ref 0.0–149.0)
VLDL: 17.6 mg/dL (ref 0.0–40.0)

## 2015-07-17 LAB — BASIC METABOLIC PANEL
BUN: 11 mg/dL (ref 6–23)
CHLORIDE: 101 meq/L (ref 96–112)
CO2: 25 mEq/L (ref 19–32)
Calcium: 9.3 mg/dL (ref 8.4–10.5)
Creatinine, Ser: 0.79 mg/dL (ref 0.40–1.50)
GFR: 105.49 mL/min (ref >60.00)
Glucose, Bld: 87 mg/dL (ref 70–99)
Potassium: 4 mEq/L (ref 3.5–5.1)
SODIUM: 139 meq/L (ref 135–145)

## 2015-07-17 LAB — TSH: TSH: 1.59 u[IU]/mL (ref 0.35–4.50)

## 2015-07-17 MED ORDER — MOMETASONE FUROATE 50 MCG/ACT NA SUSP: 2.0000 | Freq: Every day | NASAL | Status: DC

## 2015-07-17 MED ORDER — ALBUTEROL SULFATE HFA 108 (90 BASE) MCG/ACT IN AERS: 2.0000 | INHALATION_SPRAY | Freq: Four times a day (QID) | RESPIRATORY_TRACT | Status: DC | PRN

## 2015-07-17 NOTE — Assessment & Plan Note (Signed)
New.  Suspect this is a combination of tobacco abuse and untreated seasonal allergies.  Encouraged use of daily allergy meds.  Pt to use albuterol as needed for shortness of breath and/or wheezing but if he finds himself using the inhaler more than once daily, he is to call so we can adjust his tx plan.  Will follow as pt may require COPD work up.

## 2015-07-17 NOTE — Progress Notes (Signed)
Pre visit review using our clinic review tool, if applicable. No additional management support is needed unless otherwise documented below in the visit note. 

## 2015-07-17 NOTE — Assessment & Plan Note (Signed)
Chronic problem.  Adequate but not good control on current meds.  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  Asymptomatic.  Check labs.  No anticipated med changes.  Will follow.

## 2015-07-17 NOTE — Assessment & Plan Note (Signed)
New.  Pt continues to slowly gain weight.  Not exercising or following particular diet.  Stressed need for regular activity and low carb diet.  Check labs to risk stratify.  Will follow.

## 2015-07-17 NOTE — Patient Instructions (Signed)
Schedule your complete physical in 3-4 months We'll notify you of your lab results and make any changes if needed Start daily Zyrtec to decrease your congestion and post-nasal drip Use the nasal spray- 2 sprays each nostril daily Drink plenty of fluids STOP SMOKING! Use the albuterol inhaler- 2 puffs every 6 hrs as needed for shortness of breath or wheezing.  If using more than once daily, please call so we can adjust your treatment plan Continue the Diovan daily Call with any questions or concerns If you want to join [place] at the new [place] office, any scheduled appointments will automatically transfer and we will see you at [address] [place] [address] [place], [place], [place] [address]  Hang in there!!!

## 2015-07-17 NOTE — Progress Notes (Signed)
   Subjective:    Patient ID: Alvin Benton, male    DOB: 06/04/1951, 64 y.o.   MRN: [id number]  HPI HTN- chronic problem.  On Diovan daily.  Has not followed up as directed.  'i always freak out when I come to the doctor'.  Pt reports he can feel elevated BP in times of increased stress.  No CP, SOB, HAs, visual changes, edema.  Overweight- pt's BMI is approaching 29.  Not exercising regularly.    Tobacco abuse- pt continues to smoke.  No interest in quitting at this time.  Nasal congestion/ear fullness- sxs started in Feb in association w/ abscessed tooth.  Has since noted near constant nasal congestion, bilateral ear fullness w/ decreased hearing.  No improvement w/ OTC decongestants.  + wheezing.  Review of Systems For ROS see HPI     Objective:   Physical Exam  Constitutional: He is oriented to person, place, and time. He appears well-developed and well-nourished. No distress.  HENT:  Head: Normocephalic and atraumatic.  No TTP over sinuses + turbinate edema + PND TMs normal bilaterally  Eyes: Conjunctivae and EOM are normal. Pupils are equal, round, and reactive to light.  Neck: Normal range of motion. Neck supple.  Cardiovascular: Normal rate, regular rhythm, normal heart sounds and intact distal pulses.   Pulmonary/Chest: Effort normal. No respiratory distress. He has wheezes (diffuse bilateral wheezing).  Abdominal: Soft. Bowel sounds are normal. He exhibits no distension. There is no tenderness. There is no rebound.  Musculoskeletal: He exhibits no edema.  Lymphadenopathy:    He has no cervical adenopathy.  Neurological: He is alert and oriented to person, place, and time.  Skin: Skin is warm and dry.  Vitals reviewed.         Assessment & Plan:

## 2015-07-17 NOTE — Assessment & Plan Note (Signed)
New to provider, chronic problem for pt.  He used to get allergy shots prior to establishing care here.  Not currently on meds- start daily Zyrtec and nasal steroid.  Suspect that this will improve cough, ear fullness, and nasal drainage.  Reviewed supportive care and red flags that should prompt return.  Pt expressed understanding and is in agreement w/ plan.

## 2015-07-17 NOTE — Assessment & Plan Note (Signed)
Chronic problem for pt.  Again encouraged to quit smoking. He's not interested at this time. Will follow.

## 2015-07-20 ENCOUNTER — Other Ambulatory Visit: Payer: Self-pay | Admitting: Family Medicine

## 2015-07-20 DIAGNOSIS — R7989 Other specified abnormal findings of blood chemistry: Secondary | ICD-10-CM

## 2015-07-20 DIAGNOSIS — R945 Abnormal results of liver function studies: Secondary | ICD-10-CM

## 2015-07-24 ENCOUNTER — Telehealth: Payer: Self-pay | Admitting: Family Medicine

## 2015-07-24 NOTE — Telephone Encounter (Signed)
Medication filled to pharmacy as requested.   

## 2015-07-27 MED ORDER — VALSARTAN 160 MG PO TABS: ORAL_TABLET | ORAL | Status: DC

## 2015-07-27 NOTE — Telephone Encounter (Signed)
[facility] and [facility] deny having [facility] for valsartan. Pt will be out. Please resend to [facility]

## 2015-07-27 NOTE — Addendum Note (Signed)
Addended by: Geannie Risen on: 07/27/2015 04:57 PM   Modules accepted: Orders

## 2015-07-27 NOTE — Telephone Encounter (Signed)
Med refilled.

## 2015-08-07 ENCOUNTER — Other Ambulatory Visit (INDEPENDENT_AMBULATORY_CARE_PROVIDER_SITE_OTHER): Payer: BLUE CROSS/BLUE SHIELD

## 2015-08-07 ENCOUNTER — Encounter: Payer: Self-pay | Admitting: *Deleted

## 2015-08-07 DIAGNOSIS — R7989 Other specified abnormal findings of blood chemistry: Secondary | ICD-10-CM

## 2015-08-07 DIAGNOSIS — R945 Abnormal results of liver function studies: Secondary | ICD-10-CM

## 2015-08-07 LAB — HEPATIC FUNCTION PANEL
ALBUMIN: 4.2 g/dL (ref 3.5–5.2)
ALT: 56 U/L — ABNORMAL HIGH (ref 0–53)
AST: 34 U/L (ref 0–37)
Alkaline Phosphatase: 54 U/L (ref 39–117)
Bilirubin, Direct: 0.2 mg/dL (ref 0.0–0.3)
Total Bilirubin: 0.7 mg/dL (ref 0.2–1.2)
Total Protein: 8 g/dL (ref 6.0–8.3)

## 2015-10-27 ENCOUNTER — Other Ambulatory Visit: Payer: Self-pay | Admitting: General Practice

## 2015-10-27 MED ORDER — VALSARTAN 160 MG PO TABS: ORAL_TABLET | ORAL | Status: DC

## 2016-01-31 DIAGNOSIS — K5792 Diverticulitis of intestine, part unspecified, without perforation or abscess without bleeding: Secondary | ICD-10-CM | POA: Insufficient documentation

## 2016-02-15 ENCOUNTER — Ambulatory Visit (INDEPENDENT_AMBULATORY_CARE_PROVIDER_SITE_OTHER): Payer: Managed Care, Other (non HMO) | Admitting: Family Medicine

## 2016-02-15 ENCOUNTER — Encounter: Payer: Self-pay | Admitting: Family Medicine

## 2016-02-15 VITALS — BP 142/84 | HR 71 | Temp 98.1°F | Resp 16 | Ht 69.0 in | Wt 173.0 lb

## 2016-02-15 DIAGNOSIS — F1010 Alcohol abuse, uncomplicated: Secondary | ICD-10-CM | POA: Diagnosis not present

## 2016-02-15 DIAGNOSIS — K5780 Diverticulitis of intestine, part unspecified, with perforation and abscess without bleeding: Secondary | ICD-10-CM | POA: Diagnosis not present

## 2016-02-15 DIAGNOSIS — I10 Essential (primary) hypertension: Secondary | ICD-10-CM

## 2016-02-15 NOTE — Progress Notes (Signed)
   Subjective:    Patient ID: Alvin Benton, male    DOB: [birth year], 65 y.o.   MRN: [id number]  HPI Hospital f/u- [facility] for diverticulitis w/ small perforation as seen on CT scan.  Pt had LLQ 'twinge' the 3rd week of March and sxs progressively worsened. He was d/c'd on Cipro/Flagyl to complete a 14 day course of medication.  D/c summary indicated he was to have outpt f/u w/ surgery to discuss additional tx for his diverticulitis.  Also had E Coli UTI at time of admission that is being tx'd w/ Cipro.  He was d/c'd on a full liquid diet and is to advance as tolerated- is tolerating steamed veggies and rice.  Pt reports diverticulitis pain is 'gone' but will periodically feel 'like someone is beating my insides w/ a baseball bat'.  Episodes are 1-2 hrs and he feels they are related to gas.  Pt has f/u w/ surgery scheduled for this week but he is going to move that to next week- plan is for surgery but pt is not sure what type.  No longer having nausea or vomiting.  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  No pain w/ drinking.  Pt admits to struggling with intermittent depression- 'i'm 65 today, my cat died, and all this happened to me' which is one of the reasons why he says he is drinking.  Not interested in meds at this time.  Denies CP, SOB, N/V, dizziness, blacking out   Review of Systems For ROS see HPI     Objective:   Physical Exam  Constitutional: He is oriented to person, place, and time. He appears well-developed and well-nourished. No distress.  Smells of alcohol  HENT:  Head: Normocephalic and atraumatic.  Eyes: Conjunctivae and EOM are normal. Pupils are equal, round, and reactive to light.  Neck: Normal range of motion. Neck supple. No thyromegaly present.  Cardiovascular: Normal rate, regular rhythm, normal heart sounds and intact distal pulses.   No murmur heard. Pulmonary/Chest: Effort normal and breath  sounds normal. No respiratory distress.  Abdominal: Soft. Bowel sounds are normal. He exhibits no distension. There is no tenderness. There is no rebound and no guarding.  Musculoskeletal: He exhibits no edema.  Lymphadenopathy:    He has no cervical adenopathy.  Neurological: He is alert and oriented to person, place, and time. No cranial nerve deficit.  Skin: Skin is warm and dry.  Psychiatric: He has a normal mood and affect. His behavior is normal.  Vitals reviewed.         Assessment & Plan:

## 2016-02-15 NOTE — Progress Notes (Signed)
Pre visit review using our clinic review tool, if applicable. No additional management support is needed unless otherwise documented below in the visit note. 

## 2016-02-15 NOTE — Assessment & Plan Note (Signed)
Chronic problem.  Mildly elevated today but better than when he was d/c'd from hospital.  Discussed need to abstain from ETOH.  Will have him return in 1 month to recheck BP and increase Valsartan if needed.  Pt expressed understanding and is in agreement w/ plan.

## 2016-02-15 NOTE — Patient Instructions (Signed)
Follow up in 4-6 weeks to recheck mood and diverticulitis Continue to eat as you are able Make sure you [facility] Finish the Cipro/Flagyl as directed Try and avoid alcohol while your gut is healing We'll continue to monitor your blood pressure and adjust meds as needed Call with any questions or concerns Hang in there!!! Happy Birthday!!!

## 2016-02-15 NOTE — Assessment & Plan Note (Signed)
New.  Pt reports he has not been able to abstain from drinking- which is concerning since he has a colonic perforation and was advised to stop drinking.  Today he smells of ETOH.  Stressed need for him to abstain and discussed him getting help if needed- he is not interested.  He admits to depression- which is one of the reasons why he drinks.  He is not interested in depression meds at this time.  Will have him return in 4-6 weeks to recheck mood.  Pt expressed understanding and is in agreement w/ plan.

## 2016-02-15 NOTE — Assessment & Plan Note (Signed)
Pt had perforation of colon w/ abscess and 4 day hospitalization.  Finishing course of cipro/flagyl.  Pt is to have surgery f/u next week- encouraged him to schedule this.  No longer having pain.  Now tolerating veggies and rice.  Again encouraged him to abstain from ETOH.  Will follow.

## 2016-03-07 ENCOUNTER — Telehealth: Payer: Self-pay | Admitting: Family Medicine

## 2016-03-07 NOTE — Telephone Encounter (Signed)
Pt asking if Dr. Beverely Lowabori could call in a Rx for linzess, [facility] [address]

## 2016-03-07 NOTE — Telephone Encounter (Signed)
Pt was seen 4/20 and told to schedule a f/u in 4-6 weeks to recheck mood.  This has not been scheduled.  I would be happy to discuss starting Linzess at an upcoming visit but this is not a medication we start over the phone.

## 2016-03-07 NOTE — Telephone Encounter (Signed)
Pt was scheduled for appt tomorrow morning.

## 2016-03-07 NOTE — Telephone Encounter (Signed)
Please advise, i do not see this on his active medication list, and it is not on the historical list either.

## 2016-03-07 NOTE — Telephone Encounter (Signed)
Patient notified of PCP recommendations and is agreement and expresses an understanding.  

## 2016-03-08 ENCOUNTER — Encounter: Payer: Self-pay | Admitting: Family Medicine

## 2016-03-08 ENCOUNTER — Other Ambulatory Visit: Payer: Self-pay | Admitting: General Practice

## 2016-03-08 ENCOUNTER — Ambulatory Visit (INDEPENDENT_AMBULATORY_CARE_PROVIDER_SITE_OTHER): Payer: Managed Care, Other (non HMO) | Admitting: Family Medicine

## 2016-03-08 VITALS — BP 136/84 | HR 66 | Temp 98.1°F | Resp 16 | Ht 69.0 in | Wt 169.1 lb

## 2016-03-08 DIAGNOSIS — K5900 Constipation, unspecified: Secondary | ICD-10-CM

## 2016-03-08 DIAGNOSIS — I10 Essential (primary) hypertension: Secondary | ICD-10-CM | POA: Diagnosis not present

## 2016-03-08 DIAGNOSIS — K5720 Diverticulitis of large intestine with perforation and abscess without bleeding: Secondary | ICD-10-CM

## 2016-03-08 DIAGNOSIS — K5909 Other constipation: Secondary | ICD-10-CM

## 2016-03-08 MED ORDER — FOLIC ACID 1 MG PO TABS: 1.0000 mg | ORAL_TABLET | Freq: Every day | ORAL | Status: DC

## 2016-03-08 MED ORDER — LINACLOTIDE 145 MCG PO CAPS: 145.0000 ug | ORAL_CAPSULE | Freq: Every day | ORAL | Status: DC

## 2016-03-08 NOTE — Assessment & Plan Note (Signed)
New to provider, ongoing for pt.  He reports he has tried multiple OTC meds w/o relief.  Asking to start Linzess.  Prescription and coupon provided.  Will follow.

## 2016-03-08 NOTE — Progress Notes (Signed)
   Subjective:    Patient ID: Alvin Benton, male    DOB: [birth year], 65 y.o.   MRN: [id number]  HPI HTN- chronic problem, improved today.  Denies CP, SOB, HAs, visual changes, edema.  Constipation- ongoing issue for pt, 'comes out of nowhere, throws me in a whammy'.  Pt will take '1/2 a bottle of MoM and a gallon of water' and only has minimal relief.  Gas pain is 'atrocious'.  Pt saw commercial for Linzess and is interested in starting.  Pt hasn't eaten in 2 days due to fear of constipation.  Had some BM this AM but this requires Metamucil and MoM.  Just started Miralax.  Pt reports constipation has been a struggle for 'years and years'.    Diverticulitis- pt was recently hospitalized for diverticulitis w/ perf.  He was [facility] to [facility]/ [facility] but he cancelled this appt.   Review of Systems For ROS see HPI     Objective:   Physical Exam  Constitutional: He is oriented to person, place, and time. He appears well-developed and well-nourished. No distress.  HENT:  Head: Normocephalic and atraumatic.  Eyes: Conjunctivae and EOM are normal. Pupils are equal, round, and reactive to light.  Cardiovascular: Normal rate, regular rhythm and normal heart sounds.   Pulmonary/Chest: Effort normal and breath sounds normal. No respiratory distress. He has no wheezes. He has no rales.  Abdominal: Soft. Bowel sounds are normal. He exhibits no distension. There is no tenderness. There is no rebound and no guarding.  Musculoskeletal: He exhibits no edema.  Neurological: He is alert and oriented to person, place, and time.  Skin: Skin is warm and dry.  Psychiatric: He has a normal mood and affect. His behavior is normal. Thought content normal.  Vitals reviewed.         Assessment & Plan:

## 2016-03-08 NOTE — Assessment & Plan Note (Signed)
Chronic problem.  Pt never went to his surgical f/u [facility] as directed for discussion on colectomy.  Will refer locally as this is pt's preference.

## 2016-03-08 NOTE — Assessment & Plan Note (Signed)
Chronic problem.  Better BP control.  Asymptomatic.  No med changes at this time.  Will follow.

## 2016-03-08 NOTE — Patient Instructions (Signed)
Schedule your complete physical in 3-4 months Start the Linzess daily and monitor for improvement- please let me know Drink plenty of fluids We'll call you with your surgery appt Call with any questions or concerns Hang in there!!!

## 2016-03-08 NOTE — Progress Notes (Signed)
Pre visit review using our clinic review tool, if applicable. No additional management support is needed unless otherwise documented below in the visit note. 

## 2016-03-14 ENCOUNTER — Emergency Department (HOSPITAL_COMMUNITY): Payer: Managed Care, Other (non HMO)

## 2016-03-14 ENCOUNTER — Encounter (HOSPITAL_COMMUNITY): Payer: Self-pay

## 2016-03-14 ENCOUNTER — Inpatient Hospital Stay (HOSPITAL_COMMUNITY)
Admission: EM | Admit: 2016-03-14 | Discharge: 2016-03-18 | DRG: 392 | Disposition: A | Discharge status: Home Health | Payer: Managed Care, Other (non HMO) | Attending: Surgery | Admitting: Surgery

## 2016-03-14 DIAGNOSIS — K5720 Diverticulitis of large intestine with perforation and abscess without bleeding: Principal | ICD-10-CM | POA: Diagnosis present

## 2016-03-14 DIAGNOSIS — G43909 Migraine, unspecified, not intractable, without status migrainosus: Secondary | ICD-10-CM | POA: Diagnosis present

## 2016-03-14 DIAGNOSIS — Z823 Family history of stroke: Secondary | ICD-10-CM

## 2016-03-14 DIAGNOSIS — N321 Vesicointestinal fistula: Secondary | ICD-10-CM | POA: Diagnosis present

## 2016-03-14 DIAGNOSIS — R300 Dysuria: Secondary | ICD-10-CM | POA: Diagnosis present

## 2016-03-14 DIAGNOSIS — I10 Essential (primary) hypertension: Secondary | ICD-10-CM | POA: Diagnosis present

## 2016-03-14 DIAGNOSIS — F17210 Nicotine dependence, cigarettes, uncomplicated: Secondary | ICD-10-CM | POA: Diagnosis present

## 2016-03-14 DIAGNOSIS — R109 Unspecified abdominal pain: Secondary | ICD-10-CM | POA: Diagnosis present

## 2016-03-14 DIAGNOSIS — K589 Irritable bowel syndrome without diarrhea: Secondary | ICD-10-CM | POA: Diagnosis present

## 2016-03-14 DIAGNOSIS — K5701 Diverticulitis of small intestine with perforation and abscess with bleeding: Secondary | ICD-10-CM

## 2016-03-14 DIAGNOSIS — Z79899 Other long term (current) drug therapy: Secondary | ICD-10-CM

## 2016-03-14 HISTORY — DX: Migraine, unspecified, not intractable, without status migrainosus: G43.909

## 2016-03-14 LAB — COMPREHENSIVE METABOLIC PANEL
ALK PHOS: 78 U/L (ref 38–126)
ALT: 18 U/L (ref 17–63)
AST: 28 U/L (ref 15–41)
Albumin: 2.5 g/dL — ABNORMAL LOW (ref 3.5–5.0)
Anion gap: 18 — ABNORMAL HIGH (ref 5–15)
BILIRUBIN TOTAL: 0.5 mg/dL (ref 0.3–1.2)
BUN: 6 mg/dL (ref 6–20)
CALCIUM: 8.4 mg/dL — AB (ref 8.9–10.3)
CHLORIDE: 100 mmol/L — AB (ref 101–111)
CO2: 20 mmol/L — ABNORMAL LOW (ref 22–32)
CREATININE: 0.63 mg/dL (ref 0.61–1.24)
GFR calc Af Amer: >60 mL/min (ref >60)
Glucose, Bld: 92 mg/dL (ref 65–99)
Potassium: 3.5 mmol/L (ref 3.5–5.1)
Sodium: 138 mmol/L (ref 135–145)
TOTAL PROTEIN: 7.3 g/dL (ref 6.5–8.1)

## 2016-03-14 LAB — CBC
HCT: 38.8 % — ABNORMAL LOW (ref 39.0–52.0)
HEMOGLOBIN: 13.2 g/dL (ref 13.0–17.0)
MCH: 32.2 pg (ref 26.0–34.0)
MCHC: 34 g/dL (ref 30.0–36.0)
MCV: 94.6 fL (ref 78.0–100.0)
PLATELETS: 409 10*3/uL — AB (ref 150–400)
RBC: 4.1 MIL/uL — AB (ref 4.22–5.81)
RDW: 14.6 % (ref 11.5–15.5)
WBC: 10.8 10*3/uL — ABNORMAL HIGH (ref 4.0–10.5)

## 2016-03-14 LAB — URINE MICROSCOPIC-ADD ON

## 2016-03-14 LAB — URINALYSIS, ROUTINE W REFLEX MICROSCOPIC
BILIRUBIN URINE: NEGATIVE
Glucose, UA: NEGATIVE mg/dL
Ketones, ur: 15 mg/dL — AB
NITRITE: NEGATIVE
PROTEIN: 100 mg/dL — AB
SPECIFIC GRAVITY, URINE: 1.021 (ref 1.005–1.030)
pH: 6 (ref 5.0–8.0)

## 2016-03-14 LAB — LIPASE, BLOOD: Lipase: 18 U/L (ref 11–51)

## 2016-03-14 MED ORDER — KCL IN DEXTROSE-NACL 20-5-0.45 MEQ/L-%-% IV SOLN
INTRAVENOUS | Status: DC
Start: 2016-03-14 — End: 2016-03-18
  Administered 2016-03-14 – 2016-03-17 (×7): via INTRAVENOUS
  Administered 2016-03-17: 125 mL/h via INTRAVENOUS
  Administered 2016-03-18: 1000 mL via INTRAVENOUS
  Filled 2016-03-14 (×9): qty 1000

## 2016-03-14 MED ORDER — ALBUTEROL SULFATE HFA 108 (90 BASE) MCG/ACT IN AERS: 2.0000 | INHALATION_SPRAY | Freq: Four times a day (QID) | RESPIRATORY_TRACT | Status: DC | PRN

## 2016-03-14 MED ORDER — HYDRALAZINE HCL 20 MG/ML IJ SOLN: 10.0000 mg | INTRAMUSCULAR | Status: DC | PRN

## 2016-03-14 MED ORDER — ONDANSETRON HCL 4 MG/2ML IJ SOLN: 4.0000 mg | Freq: Four times a day (QID) | INTRAMUSCULAR | Status: DC | PRN

## 2016-03-14 MED ORDER — IOPAMIDOL (ISOVUE-300) INJECTION 61%
INTRAVENOUS | Status: AC
  Administered 2016-03-14: 100 mL
  Filled 2016-03-14: qty 100

## 2016-03-14 MED ORDER — ONDANSETRON 4 MG PO TBDP: 4.0000 mg | ORAL_TABLET | Freq: Four times a day (QID) | ORAL | Status: DC | PRN

## 2016-03-14 MED ORDER — PANTOPRAZOLE SODIUM 40 MG IV SOLR
40.0000 mg | Freq: Every day | INTRAVENOUS | Status: DC
  Administered 2016-03-14 – 2016-03-16 (×3): 40 mg via INTRAVENOUS
  Filled 2016-03-14 (×3): qty 40

## 2016-03-14 MED ORDER — IRBESARTAN 150 MG PO TABS
150.0000 mg | ORAL_TABLET | Freq: Every day | ORAL | Status: DC
  Administered 2016-03-14 – 2016-03-18 (×5): 150 mg via ORAL
  Filled 2016-03-14 (×5): qty 1

## 2016-03-14 MED ORDER — PIPERACILLIN-TAZOBACTAM 3.375 G IVPB
3.3750 g | Freq: Three times a day (TID) | INTRAVENOUS | Status: DC
  Administered 2016-03-14 – 2016-03-17 (×8): 3.375 g via INTRAVENOUS
  Filled 2016-03-14 (×10): qty 50

## 2016-03-14 MED ORDER — NICOTINE 14 MG/24HR TD PT24
14.0000 mg | MEDICATED_PATCH | Freq: Every day | TRANSDERMAL | Status: DC
  Administered 2016-03-14 – 2016-03-17 (×5): 14 mg via TRANSDERMAL
  Filled 2016-03-14 (×5): qty 1

## 2016-03-14 MED ORDER — HYDROMORPHONE HCL 1 MG/ML IJ SOLN
0.5000 mg | INTRAMUSCULAR | Status: DC | PRN
  Administered 2016-03-14 – 2016-03-18 (×19): 1 mg via INTRAVENOUS
  Filled 2016-03-14 (×20): qty 1

## 2016-03-14 MED ORDER — PIPERACILLIN-TAZOBACTAM 3.375 G IVPB 30 MIN
3.3750 g | Freq: Once | INTRAVENOUS | Status: AC
  Administered 2016-03-14: 3.375 g via INTRAVENOUS
  Filled 2016-03-14: qty 50

## 2016-03-14 NOTE — ED Notes (Signed)
Attempted Report 

## 2016-03-14 NOTE — H&P (Signed)
Alvin Benton is an 65 y.o. male.   Chief Complaint: Lower abdominal pressure with recent hospitalization for diverticulitis HPI: Alvin Benton has a history of diverticulitis over the years since the 1990s. He had a severe episode last month and was admitted to [facility]. He improved on bowel rest and IV antibiotics. He was discharge and was supposed to follow-up with Dr. Phineas Douglas, a surgeon who saw him there. He began to feel more lower abdominal pressure. He has not had the severe pain that he was experiencing when he was admitted to [facility]. He has, however, developed pneumaturia so he came to the emergency department at [facility]. 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 We are asked to see him for admission and management.  Past Medical History  Diagnosis Date  . Blood in stool   . Hx of migraines   . IBS (irritable bowel syndrome)   . Diverticulitis   . Hypertension     Past Surgical History  Procedure Laterality Date  . Colonoscopy      Family History  Problem Relation Age of Onset  . Stroke Mother   . Colon cancer Neg Hx   . Esophageal cancer Neg Hx   . Rectal cancer Neg Hx   . Stomach cancer Neg Hx    Social History:  reports that he has been smoking Cigarettes.  He has a 40 pack-year smoking history. He has never used smokeless tobacco. He reports that he drinks alcohol. He reports that he does not use illicit drugs.  Allergies: No Known Allergies   (Not in a hospital admission)  Results for orders placed or performed during the hospital encounter of 03/14/16 (from the past 48 hour(s))  Lipase, blood     Status: None   Collection Time: 03/14/16 11:05 AM  Result Value Ref Range   Lipase 18 11 - 51 U/L  Comprehensive metabolic panel     Status: Abnormal   Collection Time: 03/14/16 11:05 AM  Result Value Ref Range   Sodium 138 135 - 145 mmol/L   Potassium 3.5 3.5 - 5.1 mmol/L    Chloride 100 (L) 101 - 111 mmol/L   CO2 20 (L) 22 - 32 mmol/L   Glucose, Bld 92 65 - 99 mg/dL   BUN 6 6 - 20 mg/dL   Creatinine, Ser 0.63 0.61 - 1.24 mg/dL   Calcium 8.4 (L) 8.9 - 10.3 mg/dL   Total Protein 7.3 6.5 - 8.1 g/dL   Albumin 2.5 (L) 3.5 - 5.0 g/dL   AST 28 15 - 41 U/L   ALT 18 17 - 63 U/L   Alkaline Phosphatase 78 38 - 126 U/L   Total Bilirubin 0.5 0.3 - 1.2 mg/dL   GFR calc non Af Amer >60 >60 mL/min   GFR calc Af Amer >60 >60 mL/min    Comment: (NOTE) The eGFR has been calculated using the CKD EPI equation. This calculation has not been validated in all clinical situations. eGFR's persistently <60 mL/min signify possible Chronic Kidney Disease.    Anion gap 18 (H) 5 - 15  CBC     Status: Abnormal   Collection Time: 03/14/16 11:05 AM  Result Value Ref Range   WBC 10.8 (H) 4.0 - 10.5 K/uL   RBC 4.10 (L) 4.22 - 5.81 MIL/uL   Hemoglobin 13.2 13.0 - 17.0 g/dL   HCT 38.8 (L) 39.0 - 52.0 %   MCV 94.6 78.0 - 100.0 fL  MCH 32.2 26.0 - 34.0 pg   MCHC 34.0 30.0 - 36.0 g/dL   RDW 14.6 11.5 - 15.5 %   Platelets 409 (H) 150 - 400 K/uL  Urinalysis, Routine w reflex microscopic     Status: Abnormal   Collection Time: 03/14/16  3:35 PM  Result Value Ref Range   Color, Urine AMBER (A) YELLOW    Comment: BIOCHEMICALS MAY BE AFFECTED BY COLOR   APPearance TURBID (A) CLEAR   Specific Gravity, Urine 1.021 1.005 - 1.030   pH 6.0 5.0 - 8.0   Glucose, UA NEGATIVE NEGATIVE mg/dL   Hgb urine dipstick LARGE (A) NEGATIVE   Bilirubin Urine NEGATIVE NEGATIVE   Ketones, ur 15 (A) NEGATIVE mg/dL   Protein, ur 100 (A) NEGATIVE mg/dL   Nitrite NEGATIVE NEGATIVE   Leukocytes, UA LARGE (A) NEGATIVE  Urine microscopic-add on     Status: Abnormal   Collection Time: 03/14/16  3:35 PM  Result Value Ref Range   Squamous Epithelial / LPF 0-5 (A) NONE SEEN   WBC, UA TOO NUMEROUS TO COUNT 0 - 5 WBC/hpf   RBC / HPF 6-30 0 - 5 RBC/hpf   Bacteria, UA MANY (A) NONE SEEN   Ct Abdomen Pelvis W  Contrast  03/14/2016  CLINICAL DATA:  Lower abdominal pain with constipation for 3 days. Recent history of diverticulitis. EXAM: CT ABDOMEN AND PELVIS WITH CONTRAST TECHNIQUE: Multidetector CT imaging of the abdomen and pelvis was performed using the standard protocol following bolus administration of intravenous contrast. CONTRAST:  165m ISOVUE-300 IOPAMIDOL (ISOVUE-300) INJECTION 61% COMPARISON:  None. FINDINGS: Lower chest:  No acute findings. Hepatobiliary: Liver is diffusely low in density indicating fatty infiltration. Gallbladder is unremarkable. No bile duct dilatation. Pancreas: No mass, inflammatory changes, or other significant abnormality. Spleen: Within normal limits in size and appearance. Adrenals/Urinary Tract: Adrenal glands appear normal. Kidneys are unremarkable without stone or hydronephrosis. No ureteral or bladder calculi identified. Stomach/Bowel: There is marked thickening of the walls of the mid/ upper sigmoid colon, with patulous configuration, most likely sequela of an acute diverticulitis as there is fairly prominent diverticulosis within the adjacent upper sigmoid colon. There is a complex collection of fluid and air posterior to the sigmoid colon, with associated air-fluid levels, measuring 3.9 x 2.3 x 3.7 cm, consistent with abscess. I suspect additional smaller abscess collections above the sigmoid colon. I also suspect small fistulous connections above the sigmoid colon, between the sigmoid colon and surrounding mesentery and/or adjacent small bowel. There is certainly evidence of a fistula between the sigmoid colon and the underlying bladder (prominent bladder wall thickening and air within the bladder). No evidence of bowel obstruction. No free intraperitoneal air within the upper abdomen. Vascular/Lymphatic: Scattered atherosclerotic changes of the normal- caliber abdominal aorta. No acute - appearing vascular abnormality seen. Reproductive: Prostate gland is mildly enlarged  causing slight mass effect on the bladder base. Otherwise unremarkable. Other: None. Musculoskeletal: Degenerative changes within the thoracolumbar spine, the fairly mild in degree. No acute or suspicious osseous lesion identified. IMPRESSION: 1. Marked thickening of the walls of the mid/upper sigmoid colon, involving approximately 14 cm segment of the sigmoid colon, most likely related to an acute diverticulitis as there is diverticulosis seen within the adjacent upper sigmoid colon. 2. Abscess collection posterior to the sigmoid colon measuring 3.9 x 2.3 x 3.7 cm. 3. Probable additional smaller abscess collections above the sigmoid colon. 4. Findings highly suspicious for a fistula between the sigmoid colon and the underlying bladder. There may  be additional fistula formation within the soft tissues above the sigmoid colon (cannot exclude developing fistulous connections to overlying small bowel). Recommend immediate surgical consultation. These results were called by telephone at the time of interpretation on 03/14/2016 at 4:55 pm to Dr. Pearlie Oyster , who verbally acknowledged these results. Electronically Signed   By: Franki Cabot M.D.   On: 03/14/2016 16:59    Review of Systems  Constitutional: Positive for malaise/fatigue.  HENT: Negative.   Eyes: Negative for blurred vision and double vision.  Respiratory: Negative for cough.   Cardiovascular: Negative for chest pain.  Gastrointestinal: Positive for abdominal pain. Negative for heartburn, nausea and vomiting.  Genitourinary:       Pneumaturia  Musculoskeletal: Negative.   Skin: Negative.   Neurological: Negative.   Endo/Heme/Allergies: Negative.   Psychiatric/Behavioral: Negative.     Blood pressure 153/81, pulse 87, temperature 98.2 F (36.8 C), temperature source Oral, resp. rate 16, height _0  (1.753 m), weight 76.658 kg (169 lb), SpO2 96 %. Physical Exam  Constitutional: He is oriented to person, place, and time. He appears  well-developed and well-nourished. No distress.  HENT:  Head: Normocephalic and atraumatic.  Right Ear: External ear normal.  Left Ear: External ear normal.  Nose: Nose normal.  Mouth/Throat: Oropharynx is clear and moist.  Eyes: EOM are normal. Pupils are equal, round, and reactive to light. Right eye exhibits no discharge. Left eye exhibits no discharge.  Neck: Normal range of motion. Neck supple. No tracheal deviation present.  Cardiovascular: Normal rate.   2/6 holosystolic murmur  Respiratory: Effort normal. No stridor. No respiratory distress. He has wheezes. He has no rales. He exhibits no tenderness.  GI: Soft. He exhibits no distension. There is tenderness. There is no rebound and no guarding.  Mild tenderness and a fullness palpable in the left lower quadrant and suprapubic region, no guarding, no peritoneal signs, I'll sounds are present  Musculoskeletal: Normal range of motion. He exhibits no edema.  Neurological: He is alert and oriented to person, place, and time. He exhibits normal muscle tone.  Skin: Skin is warm.  Psychiatric: He has a normal mood and affect.     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 We'll check urine culture. We will try and cool this down with intravenous antibiotics and bowel rest. If he worsens, I did advise him he would need a colectomy with temporary colostomy. He expresses understanding and I answered his questions.  Zenovia Jarred, MD 03/14/2016, 6:45 PM

## 2016-03-14 NOTE — ED Notes (Signed)
Patient here with [facility]. Now for the past 4 days has been experiencing abdominal pain with constipation. Reports decreased appetite and no intake x 3 days.

## 2016-03-14 NOTE — ED Provider Notes (Signed)
CSN: [id number]     Arrival date & time 03/14/16  1049 History   First MD Initiated Contact with Patient 03/14/16 1431     Chief Complaint  Patient presents with  . Abdominal Pain    (Consider location/radiation/quality/duration/timing/severity/associated sxs/prior Treatment) Patient is a 65 y.o. male presenting with abdominal pain. The history is provided by the patient and medical records. No language interpreter was used.  Abdominal Pain Associated symptoms: constipation, dysuria and nausea   Associated symptoms: no chills, no cough, no fever, no shortness of breath and no vomiting    Alvin Benton is a 65 y.o. male  with a PMH of HTN, IBS-C on linzess, and diverticulitis on 01/31/16 who presents to the Emergency Department complaining of lower abdominal pain x 4 days. Patient was seen at outside facility on April 5 and diagnosed with diverticulitis. He was admitted and started on antibiotics. Surgery was consulted, but no surgery performed. He was supposed to have an outpatient follow-up appointment which he canceled and did not attend. Patient states his primary physician is working on setting up a follow-up appointment with a different surgeon at his request. Completed home cipro/flagyl regimen as directed. Over the last four days, he has been experiencing this lower abdominal pain which feels different from his previous pain and is not focal. He describes it as a pressure and "gas pain". Pain is present at all times, but worse at night and will intermittently become worse throughout the day. Admits to associated constipation. He states this is a chronic issue, however he has not had a bowel movement and 4 days despite his usual flushing techniques. He has taken "quarts of milk of magnesia" along with linzess with no relief. + nausea. + dysuria. No vomiting, fever, or back pain. No scrotal/penile pain or swelling.   Past Medical History  Diagnosis Date  . Blood in stool   . Hx of migraines     . IBS (irritable bowel syndrome)   . Diverticulitis   . Hypertension    Past Surgical History  Procedure Laterality Date  . Colonoscopy     Family History  Problem Relation Age of Onset  . Stroke Mother   . Colon cancer Neg Hx   . Esophageal cancer Neg Hx   . Rectal cancer Neg Hx   . Stomach cancer Neg Hx    Social History  Substance Use Topics  . Smoking status: Current Every Day Smoker -- 1.00 packs/day for 40 years    Types: Cigarettes  . Smokeless tobacco: Never Used  . Alcohol Use: Yes     Comment: occasional use    Review of Systems  Constitutional: Negative for fever and chills.  HENT: Negative for congestion.   Eyes: Negative for visual disturbance.  Respiratory: Negative for cough and shortness of breath.   Cardiovascular: Negative.   Gastrointestinal: Positive for nausea, abdominal pain and constipation. Negative for vomiting.  Genitourinary: Positive for dysuria. Negative for discharge, penile swelling, scrotal swelling, penile pain and testicular pain.  Musculoskeletal: Negative for myalgias.  Skin: Negative for rash.  Neurological: Negative for dizziness and headaches.      Allergies  Review of patient's allergies indicates no known allergies.  Home Medications   Prior to Admission medications   Medication Sig Start Date End Date Taking? Authorizing Provider  folic acid (FOLVITE) 1 MG tablet Take 1 tablet (1 mg total) by mouth daily. 03/08/16 03/08/17 Yes Sheliah Hatch, MD  linaclotide Karlene Einstein) 145 MCG CAPS capsule Take 1  capsule (145 mcg total) by mouth daily before breakfast. 03/08/16  Yes Sheliah HatchKatherine E Tabori, MD  valsartan (DIOVAN) 160 MG tablet TAKE 1 TABLET (160 MG TOTAL) BY MOUTH DAILY. 10/27/15  Yes Sheliah HatchKatherine E Tabori, MD  albuterol (PROVENTIL HFA;VENTOLIN HFA) 108 (90 BASE) MCG/ACT inhaler Inhale 2 puffs into the lungs every 6 (six) hours as needed for wheezing or shortness of breath. 07/17/15   Sheliah HatchKatherine E Tabori, MD  meloxicam (MOBIC) 15 MG  tablet Take 1 tablet (15 mg total) by mouth daily. Patient not taking: Reported on 03/14/2016 01/12/14   Sheliah HatchKatherine E Tabori, MD  mometasone (NASONEX) 50 MCG/ACT nasal spray Place 2 sprays into the nose daily. Patient taking differently: Place 2 sprays into the nose daily as needed. For allergies 07/17/15   Sheliah HatchKatherine E Tabori, MD  thiamine 100 MG tablet Take 100 mg by mouth. 02/04/16 02/03/17  Historical Provider, MD   BP 153/81 mmHg  Pulse 87  Temp(Src) 98.2 F (36.8 C) (Oral)  Resp 16  Ht 5\' 9"  (1.753 m)  Wt 76.658 kg  BMI 24.95 kg/m2  SpO2 96% Physical Exam  Constitutional: He is oriented to person, place, and time. He appears well-developed and well-nourished.  Alert and in no acute distress  HENT:  Head: Normocephalic and atraumatic.  Cardiovascular: Normal rate, regular rhythm, normal heart sounds and intact distal pulses.  Exam reveals no gallop and no friction rub.   No murmur heard. Pulmonary/Chest: Effort normal and breath sounds normal. No respiratory distress. He has no wheezes. He has no rales. He exhibits no tenderness.  Abdominal: Soft. He exhibits no mass. There is tenderness. There is no rebound and no guarding.    BS hyperactive. Mild suprapubic distention.   Genitourinary:  Chaperone present for exam. Rectal exam with no impaction or tenderness. No signs of lesion or erythema on the penis or testicles. The penis and testicles are nontender. No testicular masses or swelling. No signs of any inguinal hernias. No signs of any discharge from the penis. Cremaster reflex present bilaterally.   Musculoskeletal: He exhibits no edema.  Neurological: He is alert and oriented to person, place, and time.  Skin: Skin is warm and dry.  Nursing note and vitals reviewed.   ED Course  Procedures (including critical care time) Labs Review Labs Reviewed  COMPREHENSIVE METABOLIC PANEL - Abnormal; Notable for the following:    Chloride 100 (*)    CO2 20 (*)    Calcium 8.4 (*)     Albumin 2.5 (*)    Anion gap 18 (*)    All other components within normal limits  CBC - Abnormal; Notable for the following:    WBC 10.8 (*)    RBC 4.10 (*)    HCT 38.8 (*)    Platelets 409 (*)    All other components within normal limits  URINALYSIS, ROUTINE W REFLEX MICROSCOPIC (NOT AT [facility]) - Abnormal; Notable for the following:    Color, Urine AMBER (*)    APPearance TURBID (*)    Hgb urine dipstick LARGE (*)    Ketones, ur 15 (*)    Protein, ur 100 (*)    Leukocytes, UA LARGE (*)    All other components within normal limits  URINE MICROSCOPIC-ADD ON - Abnormal; Notable for the following:    Squamous Epithelial / LPF 0-5 (*)    Bacteria, UA MANY (*)    All other components within normal limits  URINE CULTURE  LIPASE, BLOOD    Imaging Review Ct Abdomen  Pelvis W Contrast  03/14/2016  CLINICAL DATA:  Lower abdominal pain with constipation for 3 days. Recent history of diverticulitis. EXAM: CT ABDOMEN AND PELVIS WITH CONTRAST TECHNIQUE: Multidetector CT imaging of the abdomen and pelvis was performed using the standard protocol following bolus administration of intravenous contrast. CONTRAST:  ISOVUE-300 IOPAMIDOL (ISOVUE-300) INJECTION 61% COMPARISON:  None. FINDINGS: Lower chest:  No acute findings. Hepatobiliary: Liver is diffusely low in density indicating fatty infiltration. Gallbladder is unremarkable. No bile duct dilatation. Pancreas: No mass, inflammatory changes, or other significant abnormality. Spleen: Within normal limits in size and appearance. Adrenals/Urinary Tract: Adrenal glands appear normal. Kidneys are unremarkable without stone or hydronephrosis. No ureteral or bladder calculi identified. Stomach/Bowel: There is marked thickening of the walls of the mid/ upper sigmoid colon, with patulous configuration, most likely sequela of an acute diverticulitis as there is fairly prominent diverticulosis within the adjacent upper sigmoid colon. There is a complex collection  of fluid and air posterior to the sigmoid colon, with associated air-fluid levels, measuring 3.9 x 2.3 x 3.7 cm, consistent with abscess. I suspect additional smaller abscess collections above the sigmoid colon. I also suspect small fistulous connections above the sigmoid colon, between the sigmoid colon and surrounding mesentery and/or adjacent small bowel. There is certainly evidence of a fistula between the sigmoid colon and the underlying bladder (prominent bladder wall thickening and air within the bladder). No evidence of bowel obstruction. No free intraperitoneal air within the upper abdomen. Vascular/Lymphatic: Scattered atherosclerotic changes of the normal- caliber abdominal aorta. No acute - appearing vascular abnormality seen. Reproductive: Prostate gland is mildly enlarged causing slight mass effect on the bladder base. Otherwise unremarkable. Other: None. Musculoskeletal: Degenerative changes within the thoracolumbar spine, the fairly mild in degree. No acute or suspicious osseous lesion identified. IMPRESSION: 1. Marked thickening of the walls of the mid/upper sigmoid colon, involving approximately 14 cm segment of the sigmoid colon, most likely related to an acute diverticulitis as there is diverticulosis seen within the adjacent upper sigmoid colon. 2. Abscess collection posterior to the sigmoid colon measuring 3.9 x 2.3 x 3.7 cm. 3. Probable additional smaller abscess collections above the sigmoid colon. 4. Findings highly suspicious for a fistula between the sigmoid colon and the underlying bladder. There may be additional fistula formation within the soft tissues above the sigmoid colon (cannot exclude developing fistulous connections to overlying small bowel). Recommend immediate surgical consultation. These results were called by telephone at the time of interpretation on 03/14/2016 at 4:55 pm to Dr. Elizabeth Sauer , who verbally acknowledged these results. Electronically Signed   By: Bary Richard  M.D.   On: 03/14/2016 16:59   I have personally reviewed and evaluated these images and lab results as part of my medical decision-making.   EKG Interpretation None      MDM   Final diagnoses:  Abdominal pain   Alvin Benton presents with lower abdominal pain and dysuria x 4 days. Hx of diverticulitis one month ago, but states this pain feels different.   Labs: CBC with white count of 10.8; UA with large amount of leuks, TNTC WB's, and many WBC's. CMP with cl 100, albumin 2.5, co2 20, anion gap 18. Lipase wdl.  Imaging: CT abdomen with marked thickening of sigmoid colon most likely 2/2 acute diverticulitis. Abscess collection posterior to the sigmoid colon and probable additional smaller abscess above sigmoid colon. Highly suspicious for a fistula between sigmoid and bladder which does correlate with history and UA findings.   IV  zosyn started.   Consults: Surgery, Dr. Janee Morn will admit.   Patient discussed with Dr. Radford Pax who agrees with treatment plan.   [facility] Kena Limon, PA-C 03/14/16 1959  Nelva Nay, MD 03/16/16 [id number]

## 2016-03-15 LAB — BASIC METABOLIC PANEL
Anion gap: 12 (ref 5–15)
BUN: 9 mg/dL (ref 6–20)
CALCIUM: 8.5 mg/dL — AB (ref 8.9–10.3)
CO2: 28 mmol/L (ref 22–32)
CREATININE: 0.67 mg/dL (ref 0.61–1.24)
Chloride: 97 mmol/L — ABNORMAL LOW (ref 101–111)
GFR calc Af Amer: >60 mL/min (ref >60)
GLUCOSE: 107 mg/dL — AB (ref 65–99)
Potassium: 3.8 mmol/L (ref 3.5–5.1)
SODIUM: 137 mmol/L (ref 135–145)

## 2016-03-15 LAB — URINE CULTURE: CULTURE: NO GROWTH

## 2016-03-15 LAB — CBC
HCT: 37.9 % — ABNORMAL LOW (ref 39.0–52.0)
HEMOGLOBIN: 12.4 g/dL — AB (ref 13.0–17.0)
MCH: 31.1 pg (ref 26.0–34.0)
MCHC: 32.7 g/dL (ref 30.0–36.0)
MCV: 95 fL (ref 78.0–100.0)
PLATELETS: 405 10*3/uL — AB (ref 150–400)
RBC: 3.99 MIL/uL — AB (ref 4.22–5.81)
RDW: 14.4 % (ref 11.5–15.5)
WBC: 9.1 10*3/uL (ref 4.0–10.5)

## 2016-03-15 LAB — PROTIME-INR
INR: 1.18 (ref 0.00–1.49)
PROTHROMBIN TIME: 15.1 s (ref 11.6–15.2)

## 2016-03-15 MED ORDER — VITAMIN B-1 100 MG PO TABS
100.0000 mg | ORAL_TABLET | Freq: Every day | ORAL | Status: DC
  Administered 2016-03-15 – 2016-03-18 (×4): 100 mg via ORAL
  Filled 2016-03-15 (×4): qty 1

## 2016-03-15 MED ORDER — THIAMINE HCL 100 MG/ML IJ SOLN
100.0000 mg | Freq: Every day | INTRAMUSCULAR | Status: DC
  Filled 2016-03-15: qty 2

## 2016-03-15 MED ORDER — LORAZEPAM 2 MG/ML IJ SOLN
1.0000 mg | Freq: Four times a day (QID) | INTRAMUSCULAR | Status: AC | PRN
  Administered 2016-03-16 – 2016-03-17 (×2): 1 mg via INTRAVENOUS
  Filled 2016-03-15 (×2): qty 1

## 2016-03-15 MED ORDER — BOOST / RESOURCE BREEZE PO LIQD
1.0000 | Freq: Three times a day (TID) | ORAL | Status: DC
  Administered 2016-03-15 – 2016-03-17 (×6): 1 via ORAL

## 2016-03-15 MED ORDER — FOLIC ACID 1 MG PO TABS
1.0000 mg | ORAL_TABLET | Freq: Every day | ORAL | Status: DC
  Administered 2016-03-15 – 2016-03-18 (×4): 1 mg via ORAL
  Filled 2016-03-15 (×4): qty 1

## 2016-03-15 MED ORDER — ADULT MULTIVITAMIN W/MINERALS CH
1.0000 | ORAL_TABLET | Freq: Every day | ORAL | Status: DC
  Administered 2016-03-15 – 2016-03-18 (×4): 1 via ORAL
  Filled 2016-03-15 (×4): qty 1

## 2016-03-15 MED ORDER — LORAZEPAM 1 MG PO TABS
1.0000 mg | ORAL_TABLET | Freq: Four times a day (QID) | ORAL | Status: AC | PRN
  Administered 2016-03-16 – 2016-03-18 (×5): 1 mg via ORAL
  Filled 2016-03-15 (×6): qty 1

## 2016-03-15 MED ORDER — CETYLPYRIDINIUM CHLORIDE 0.05 % MT LIQD
7.0000 mL | Freq: Two times a day (BID) | OROMUCOSAL | Status: DC
  Administered 2016-03-15 – 2016-03-18 (×5): 7 mL via OROMUCOSAL

## 2016-03-15 NOTE — Progress Notes (Signed)
Initial Nutrition Assessment  DOCUMENTATION CODES:   Severe malnutrition in context of chronic illness  INTERVENTION:   Boost Breeze po TID, each supplement provides 250 kcal and 9 grams of protein  NUTRITION DIAGNOSIS:   Malnutrition related to chronic illness as evidenced by energy intake < or equal to 75% for > or equal to 1 month, 13 percent weight loss x 3 months.  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   Pt reports that he has been dealing with this since February. Since this time pt has only been able to tolerate soup, cheerios, and small amounts of rice and steamed veggies. Pt was weighing 195 lb prior to this.  Pt on IV abx and clear liquids. Unable to place drain. Pt tolerated some clear liquids for lunch.   Medications reviewed and include: folic acid, MVI, thiamine   Diet Order:  Diet clear liquid Room service appropriate?: Yes; Fluid consistency:: Thin  Skin:  Reviewed, no issues  Last BM:  5/18  Height:   Ht Readings from Last 1 Encounters:  03/14/16 5\' 9"  (1.753 m)    Weight:   Wt Readings from Last 1 Encounters:  03/14/16 169 lb (76.658 kg)    Ideal Body Weight:  72.7 kg  BMI:  Body mass index is 24.95 kg/(m^2).  Estimated Nutritional Needs:   Kcal:  2000-2200  Protein:  105-120 grams  Fluid:  > 2 L/day  EDUCATION NEEDS:   No education needs identified at this time  Kendell BaneHeather Alfrieda Tarry RD, LDN, CNSC [phone] Pager [phone] After Hours Pager

## 2016-03-15 NOTE — Care Management Note (Signed)
Case Management Note  Patient Details  Name: Alvin Benton MRN: [name] Date of Birth: 25-Sep-1951  Subjective/Objective:                    Action/Plan:  Possible colectomy with temporary colostomy. Will follow for discharge needs  Expected Discharge Date:                  Expected Discharge Plan:  Home w Home Health Services  In-House Referral:     Discharge planning Services     Post Acute Care Choice:    Choice offered to:     DME Arranged:    DME Agency:     HH Arranged:    HH Agency:     Status of Service:  In process, will continue to follow  Medicare Important Message Given:    Date Medicare IM Given:    Medicare IM give by:    Date Additional Medicare IM Given:    Additional Medicare Important Message give by:     If discussed at Long Length of Stay Meetings, dates discussed:    Additional Comments:  Alvin Benton, Alvin Diebold Marie, RN 03/15/2016, 10:11 AM

## 2016-03-15 NOTE — Progress Notes (Signed)
Patient ID: Alvin Benton, male   DOB: 06-23-1951, 65 y.o.   MRN: [id number]     [facility]      [address] [place], [place] [address]    Phone: [phone] FAX: [phone]     Subjective: Reports air when urinating.  Afebrile.  WBC normalized.  Pain improved, characterizes as dull.   Objective:  Vital signs:  Filed Vitals:   03/14/16 2010 03/14/16 2013 03/14/16 2157 03/15/16 0506  BP: 180/89  139/68 177/75  Pulse: 88  93 81  Temp: 98.5 F (36.9 C)  98.4 F (36.9 C) 97.7 F (36.5 C)  TempSrc: Oral  Oral Oral  Resp: _0 Height:  5' 9" (1.753 m)    Weight:  76.658 kg (169 lb)    SpO2: 100%  96% 98%    Last BM Date: 03/14/16  Intake/Output   Yesterday:  05/18 0701 - 05/19 0700 In: 1091.7 [I.V.:991.7; IV Piggyback:100] Out: 200 [Urine:200] This shift: I/O last 3 completed shifts: In: 1091.7 [I.V.:991.7; IV Piggyback:100] Out: 200 [Urine:200]    Physical Exam: General: Pt awake/alert/oriented x4 in no acute distress  Abdomen: Soft.  Nondistended.  Non tender.  No evidence of peritonitis.  No incarcerated hernias.    Problem List:   Active Problems:   Diverticulitis of large intestine with perforation and abscess    Results:   Labs: Results for orders placed or performed during the hospital encounter of 03/14/16 (from the past 48 hour(s))  Lipase, blood     Status: None   Collection Time: 03/14/16 11:05 AM  Result Value Ref Range   Lipase 18 11 - 51 U/L  Comprehensive metabolic panel     Status: Abnormal   Collection Time: 03/14/16 11:05 AM  Result Value Ref Range   Sodium 138 135 - 145 mmol/L   Potassium 3.5 3.5 - 5.1 mmol/L   Chloride 100 (L) 101 - 111 mmol/L   CO2 20 (L) 22 - 32 mmol/L   Glucose, Bld 92 65 - 99 mg/dL   BUN 6 6 - 20 mg/dL   Creatinine, Ser 0.63 0.61 - 1.24 mg/dL   Calcium 8.4 (L) 8.9 - 10.3 mg/dL   Total Protein 7.3 6.5 - 8.1 g/dL   Albumin 2.5 (L) 3.5 - 5.0 g/dL   AST  28 15 - 41 U/L   ALT 18 17 - 63 U/L   Alkaline Phosphatase 78 38 - 126 U/L   Total Bilirubin 0.5 0.3 - 1.2 mg/dL   GFR calc non Af Amer >60 >60 mL/min   GFR calc Af Amer >60 >60 mL/min    Comment: (NOTE) The eGFR has been calculated using the CKD EPI equation. This calculation has not been validated in all clinical situations. eGFR's persistently <60 mL/min signify possible Chronic Kidney Disease.    Anion gap 18 (H) 5 - 15  CBC     Status: Abnormal   Collection Time: 03/14/16 11:05 AM  Result Value Ref Range   WBC 10.8 (H) 4.0 - 10.5 K/uL   RBC 4.10 (L) 4.22 - 5.81 MIL/uL   Hemoglobin 13.2 13.0 - 17.0 g/dL   HCT 38.8 (L) 39.0 - 52.0 %   MCV 94.6 78.0 - 100.0 fL   MCH 32.2 26.0 - 34.0 pg   MCHC 34.0 30.0 - 36.0 g/dL   RDW 14.6 11.5 - 15.5 %   Platelets 409 (H) 150 - 400 K/uL  Urinalysis, Routine w reflex microscopic  Status: Abnormal   Collection Time: 03/14/16  3:35 PM  Result Value Ref Range   Color, Urine AMBER (A) YELLOW    Comment: BIOCHEMICALS MAY BE AFFECTED BY COLOR   APPearance TURBID (A) CLEAR   Specific Gravity, Urine 1.021 1.005 - 1.030   pH 6.0 5.0 - 8.0   Glucose, UA NEGATIVE NEGATIVE mg/dL   Hgb urine dipstick LARGE (A) NEGATIVE   Bilirubin Urine NEGATIVE NEGATIVE   Ketones, ur 15 (A) NEGATIVE mg/dL   Protein, ur 100 (A) NEGATIVE mg/dL   Nitrite NEGATIVE NEGATIVE   Leukocytes, UA LARGE (A) NEGATIVE  Urine microscopic-add on     Status: Abnormal   Collection Time: 03/14/16  3:35 PM  Result Value Ref Range   Squamous Epithelial / LPF 0-5 (A) NONE SEEN   WBC, UA TOO NUMEROUS TO COUNT 0 - 5 WBC/hpf   RBC / HPF 6-30 0 - 5 RBC/hpf   Bacteria, UA MANY (A) NONE SEEN  CBC     Status: Abnormal   Collection Time: 03/15/16  3:58 AM  Result Value Ref Range   WBC 9.1 4.0 - 10.5 K/uL   RBC 3.99 (L) 4.22 - 5.81 MIL/uL   Hemoglobin 12.4 (L) 13.0 - 17.0 g/dL   HCT 37.9 (L) 39.0 - 52.0 %   MCV 95.0 78.0 - 100.0 fL   MCH 31.1 26.0 - 34.0 pg   MCHC 32.7 30.0 -  36.0 g/dL   RDW 14.4 11.5 - 15.5 %   Platelets 405 (H) 150 - 400 K/uL  Basic metabolic panel     Status: Abnormal   Collection Time: 03/15/16  3:58 AM  Result Value Ref Range   Sodium 137 135 - 145 mmol/L   Potassium 3.8 3.5 - 5.1 mmol/L   Chloride 97 (L) 101 - 111 mmol/L   CO2 28 22 - 32 mmol/L   Glucose, Bld 107 (H) 65 - 99 mg/dL   BUN 9 6 - 20 mg/dL   Creatinine, Ser 0.67 0.61 - 1.24 mg/dL   Calcium 8.5 (L) 8.9 - 10.3 mg/dL   GFR calc non Af Amer >60 >60 mL/min   GFR calc Af Amer >60 >60 mL/min    Comment: (NOTE) The eGFR has been calculated using the CKD EPI equation. This calculation has not been validated in all clinical situations. eGFR's persistently <60 mL/min signify possible Chronic Kidney Disease.    Anion gap 12 5 - 15  Protime-INR     Status: None   Collection Time: 03/15/16  8:34 AM  Result Value Ref Range   Prothrombin Time 15.1 11.6 - 15.2 seconds   INR 1.18 0.00 - 1.49    Imaging / Studies: Ct Abdomen Pelvis W Contrast  03/14/2016  CLINICAL DATA:  Lower abdominal pain with constipation for 3 days. Recent history of diverticulitis. EXAM: CT ABDOMEN AND PELVIS WITH CONTRAST TECHNIQUE: Multidetector CT imaging of the abdomen and pelvis was performed using the standard protocol following bolus administration of intravenous contrast. CONTRAST:  166m ISOVUE-300 IOPAMIDOL (ISOVUE-300) INJECTION 61% COMPARISON:  None. FINDINGS: Lower chest:  No acute findings. Hepatobiliary: Liver is diffusely low in density indicating fatty infiltration. Gallbladder is unremarkable. No bile duct dilatation. Pancreas: No mass, inflammatory changes, or other significant abnormality. Spleen: Within normal limits in size and appearance. Adrenals/Urinary Tract: Adrenal glands appear normal. Kidneys are unremarkable without stone or hydronephrosis. No ureteral or bladder calculi identified. Stomach/Bowel: There is marked thickening of the walls of the mid/ upper sigmoid colon, with patulous  configuration, most likely sequela of an acute diverticulitis as there is fairly prominent diverticulosis within the adjacent upper sigmoid colon. There is a complex collection of fluid and air posterior to the sigmoid colon, with associated air-fluid levels, measuring 3.9 x 2.3 x 3.7 cm, consistent with abscess. I suspect additional smaller abscess collections above the sigmoid colon. I also suspect small fistulous connections above the sigmoid colon, between the sigmoid colon and surrounding mesentery and/or adjacent small bowel. There is certainly evidence of a fistula between the sigmoid colon and the underlying bladder (prominent bladder wall thickening and air within the bladder). No evidence of bowel obstruction. No free intraperitoneal air within the upper abdomen. Vascular/Lymphatic: Scattered atherosclerotic changes of the normal- caliber abdominal aorta. No acute - appearing vascular abnormality seen. Reproductive: Prostate gland is mildly enlarged causing slight mass effect on the bladder base. Otherwise unremarkable. Other: None. Musculoskeletal: Degenerative changes within the thoracolumbar spine, the fairly mild in degree. No acute or suspicious osseous lesion identified. IMPRESSION: 1. Marked thickening of the walls of the mid/upper sigmoid colon, involving approximately 14 cm segment of the sigmoid colon, most likely related to an acute diverticulitis as there is diverticulosis seen within the adjacent upper sigmoid colon. 2. Abscess collection posterior to the sigmoid colon measuring 3.9 x 2.3 x 3.7 cm. 3. Probable additional smaller abscess collections above the sigmoid colon. 4. Findings highly suspicious for a fistula between the sigmoid colon and the underlying bladder. There may be additional fistula formation within the soft tissues above the sigmoid colon (cannot exclude developing fistulous connections to overlying small bowel). Recommend immediate surgical consultation. These results were  called by telephone at the time of interpretation on 03/14/2016 at 4:55 pm to Dr. Pearlie Oyster , who verbally acknowledged these results. Electronically Signed   By: Franki Cabot M.D.   On: 03/14/2016 16:59    Medications / Allergies:  Scheduled Meds: . antiseptic oral rinse  7 mL Mouth Rinse BID  . folic acid  1 mg Oral Daily  . irbesartan  150 mg Oral Daily  . multivitamin with minerals  1 tablet Oral Daily  . nicotine  14 mg Transdermal QHS  . pantoprazole (PROTONIX) IV  40 mg Intravenous QHS  . piperacillin-tazobactam (ZOSYN)  IV  3.375 g Intravenous Q8H  . thiamine  100 mg Oral Daily   Or  . thiamine  100 mg Intravenous Daily   Continuous Infusions: . dextrose 5 % and 0.45 % NaCl with KCl 20 mEq/L 125 mL/hr at 03/15/16 0442   PRN Meds:.hydrALAZINE, HYDROmorphone (DILAUDID) injection, LORazepam **OR** LORazepam, ondansetron **OR** ondansetron (ZOFRAN) IV  Antibiotics: Anti-infectives    Start     Dose/Rate Route Frequency Ordered Stop   03/14/16 2030  piperacillin-tazobactam (ZOSYN) IVPB 3.375 g     3.375 g 12.5 mL/hr over 240 Minutes Intravenous Every 8 hours 03/14/16 2018     03/14/16 1715  piperacillin-tazobactam (ZOSYN) IVPB 3.375 g     3.375 g 100 mL/hr over 30 Minutes Intravenous  Once 03/14/16 1709 03/14/16 1804        Assessment/Plan 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  Will continue with antibiotics. He has improved, WBC normalized and remains afebrile. Colonoscopy 1/15 FEN-clears, IVF VTE prophylaxis-scd, add lovenox [id number]  Erby Pian, ANP-BC [facility] Pager [phone]) For consults and floor pages call [phone]([id number])  03/15/2016 10:25 AM

## 2016-03-16 NOTE — Progress Notes (Signed)
Subjective: No complaints. Feels better. Passing flatus  Objective: Vital signs in last 24 hours: Temp:  [97.9 F (36.6 C)-98.3 F (36.8 C)] 98.2 F (36.8 C) (05/20 0531) Pulse Rate:  [81-86] 82 (05/20 0531) Resp:  [16-18] 16 (05/20 0531) BP: (129-140)/(71-76) 129/71 mmHg (05/20 0531) SpO2:  [99 %-100 %] 99 % (05/20 0531) Last BM Date: 03/15/16  Intake/Output from previous day: 05/19 0701 - 05/20 0700 In: 3054.2 [P.O.:800; I.V.:2204.2; IV Piggyback:50] Out: 1800 [Urine:1800] Intake/Output this shift: Total I/O In: 416 [P.O.:416] Out: 400 [Urine:400]  Resp: clear to auscultation bilaterally Cardio: regular rate and rhythm GI: soft, only mild lower abdominal tenderness  Lab Results:   Recent Labs  03/14/16 1105 03/15/16 0358  WBC 10.8* 9.1  HGB 13.2 12.4*  HCT 38.8* 37.9*  PLT 409* 405*   BMET  Recent Labs  03/14/16 1105 03/15/16 0358  NA 138 137  K 3.5 3.8  CL 100* 97*  CO2 20* 28  GLUCOSE 92 107*  BUN 6 9  CREATININE 0.63 0.67  CALCIUM 8.4* 8.5*   PT/INR  Recent Labs  03/15/16 0834  LABPROT 15.1  INR 1.18   ABG No results for input(s): PHART, HCO3 in the last 72 hours.  Invalid input(s): PCO2, PO2  Studies/Results: Ct Abdomen Pelvis W Contrast  03/14/2016  CLINICAL DATA:  Lower abdominal pain with constipation for 3 days. Recent history of diverticulitis. EXAM: CT ABDOMEN AND PELVIS WITH CONTRAST TECHNIQUE: Multidetector CT imaging of the abdomen and pelvis was performed using the standard protocol following bolus administration of intravenous contrast. CONTRAST:  100mL ISOVUE-300 IOPAMIDOL (ISOVUE-300) INJECTION 61% COMPARISON:  None. FINDINGS: Lower chest:  No acute findings. Hepatobiliary: Liver is diffusely low in density indicating fatty infiltration. Gallbladder is unremarkable. No bile duct dilatation. Pancreas: No mass, inflammatory changes, or other significant abnormality. Spleen: Within normal limits in size and appearance.  Adrenals/Urinary Tract: Adrenal glands appear normal. Kidneys are unremarkable without stone or hydronephrosis. No ureteral or bladder calculi identified. Stomach/Bowel: There is marked thickening of the walls of the mid/ upper sigmoid colon, with patulous configuration, most likely sequela of an acute diverticulitis as there is fairly prominent diverticulosis within the adjacent upper sigmoid colon. There is a complex collection of fluid and air posterior to the sigmoid colon, with associated air-fluid levels, measuring 3.9 x 2.3 x 3.7 cm, consistent with abscess. I suspect additional smaller abscess collections above the sigmoid colon. I also suspect small fistulous connections above the sigmoid colon, between the sigmoid colon and surrounding mesentery and/or adjacent small bowel. There is certainly evidence of a fistula between the sigmoid colon and the underlying bladder (prominent bladder wall thickening and air within the bladder). No evidence of bowel obstruction. No free intraperitoneal air within the upper abdomen. Vascular/Lymphatic: Scattered atherosclerotic changes of the normal- caliber abdominal aorta. No acute - appearing vascular abnormality seen. Reproductive: Prostate gland is mildly enlarged causing slight mass effect on the bladder base. Otherwise unremarkable. Other: None. Musculoskeletal: Degenerative changes within the thoracolumbar spine, the fairly mild in degree. No acute or suspicious osseous lesion identified. IMPRESSION: 1. Marked thickening of the walls of the mid/upper sigmoid colon, involving approximately 14 cm segment of the sigmoid colon, most likely related to an acute diverticulitis as there is diverticulosis seen within the adjacent upper sigmoid colon. 2. Abscess collection posterior to the sigmoid colon measuring 3.9 x 2.3 x 3.7 cm. 3. Probable additional smaller abscess collections above the sigmoid colon. 4. Findings highly suspicious for a fistula between the sigmoid  colon  and the underlying bladder. There may be additional fistula formation within the soft tissues above the sigmoid colon (cannot exclude developing fistulous connections to overlying small bowel). Recommend immediate surgical consultation. These results were called by telephone at the time of interpretation on 03/14/2016 at 4:55 pm to Dr. Elizabeth Sauer , who verbally acknowledged these results. Electronically Signed   By: Bary Richard M.D.   On: 03/14/2016 16:59    Anti-infectives: Anti-infectives    Start     Dose/Rate Route Frequency Ordered Stop   03/14/16 2030  piperacillin-tazobactam (ZOSYN) IVPB 3.375 g     3.375 g 12.5 mL/hr over 240 Minutes Intravenous Every 8 hours 03/14/16 2018     03/14/16 1715  piperacillin-tazobactam (ZOSYN) IVPB 3.375 g     3.375 g 100 mL/hr over 30 Minutes Intravenous  Once 03/14/16 1709 03/14/16 1804      Assessment/Plan: s/p * No surgery found * would stay with clears another day  Continue IV zosyn ambulate  LOS: 2 days    TOTH III,Clover Feehan S 03/16/2016

## 2016-03-17 MED ORDER — PANTOPRAZOLE SODIUM 40 MG PO TBEC
40.0000 mg | DELAYED_RELEASE_TABLET | Freq: Every day | ORAL | Status: DC
  Administered 2016-03-17 – 2016-03-18 (×2): 40 mg via ORAL
  Filled 2016-03-17 (×2): qty 1

## 2016-03-17 MED ORDER — AMOXICILLIN-POT CLAVULANATE 875-125 MG PO TABS
1.0000 | ORAL_TABLET | Freq: Two times a day (BID) | ORAL | Status: DC
  Administered 2016-03-17 – 2016-03-18 (×3): 1 via ORAL
  Filled 2016-03-17 (×3): qty 1

## 2016-03-17 NOTE — Progress Notes (Signed)
  Subjective: Feeling better. Denies abdominal pain  Objective: Vital signs in last 24 hours: Temp:  [98 F (36.7 C)-98.2 F (36.8 C)] 98.1 F (36.7 C) (05/21 0521) Pulse Rate:  [70-89] 79 (05/21 0521) Resp:  [18-21] 19 (05/21 0521) BP: (126-164)/(69-90) 146/90 mmHg (05/21 0521) SpO2:  [98 %-100 %] 99 % (05/21 0521) Last BM Date: 03/15/16  Intake/Output from previous day: 05/20 0701 - 05/21 0700 In: 5014.8 [P.O.:896; I.V.:3918.8; IV Piggyback:200] Out: 2500 [Urine:2500] Intake/Output this shift: Total I/O In: -  Out: 610 [Urine:610]  Resp: clear to auscultation bilaterally Cardio: regular rate and rhythm GI: soft, nontender. there is some fullness in suprapubic area palpated  Lab Results:   Recent Labs  03/14/16 1105 03/15/16 0358  WBC 10.8* 9.1  HGB 13.2 12.4*  HCT 38.8* 37.9*  PLT 409* 405*   BMET  Recent Labs  03/14/16 1105 03/15/16 0358  NA 138 137  K 3.5 3.8  CL 100* 97*  CO2 20* 28  GLUCOSE 92 107*  BUN 6 9  CREATININE 0.63 0.67  CALCIUM 8.4* 8.5*   PT/INR  Recent Labs  03/15/16 0834  LABPROT 15.1  INR 1.18   ABG No results for input(s): PHART, HCO3 in the last 72 hours.  Invalid input(s): PCO2, PO2  Studies/Results: No results found.  Anti-infectives: Anti-infectives    Start     Dose/Rate Route Frequency Ordered Stop   03/17/16 1000  amoxicillin-clavulanate (AUGMENTIN) 875-125 MG per tablet 1 tablet     1 tablet Oral Every 12 hours 03/17/16 0935     03/14/16 2030  piperacillin-tazobactam (ZOSYN) IVPB 3.375 g  Status:  Discontinued     3.375 g 12.5 mL/hr over 240 Minutes Intravenous Every 8 hours 03/14/16 2018 03/17/16 0935   03/14/16 1715  piperacillin-tazobactam (ZOSYN) IVPB 3.375 g     3.375 g 100 mL/hr over 30 Minutes Intravenous  Once 03/14/16 1709 03/14/16 1804      Assessment/Plan: s/p * No surgery found * Advance diet. Start fulls today Start augmentin and stop IV zosyn ambulate  LOS: 3 days    TOTH III,Ediel Unangst  S 03/17/2016

## 2016-03-18 MED ORDER — SACCHAROMYCES BOULARDII 250 MG PO CAPS: 250.0000 mg | ORAL_CAPSULE | Freq: Two times a day (BID) | ORAL | Status: DC

## 2016-03-18 MED ORDER — HYDROCODONE-ACETAMINOPHEN 5-325 MG PO TABS: 1.0000 | ORAL_TABLET | Freq: Three times a day (TID) | ORAL | Status: DC | PRN

## 2016-03-18 MED ORDER — AMOXICILLIN-POT CLAVULANATE 875-125 MG PO TABS: 1.0000 | ORAL_TABLET | Freq: Two times a day (BID) | ORAL | Status: DC

## 2016-03-18 NOTE — Progress Notes (Signed)
Pt discharged home in stable condition. Discharge instructions provided pre discharge. Pt going home in a taxi

## 2016-03-18 NOTE — Discharge Summary (Signed)
Physician Discharge Summary  Alvin NovaScott Meddaugh UEA:[id number] DOB: [birth year] DOA: 03/14/2016  PCP: Neena RhymesKatherine Tabori, MD  Consultation: none  Admit date: 03/14/2016 Discharge date: 03/18/2016  Recommendations for Outpatient Follow-up:   Follow-up Information    Follow up with Liz MaladyHOMPSON,BURKE E, MD On 04/03/2016.   Specialty:  General Surgery   Why:  arrive by 10:30AM for a 10:50AM check up with the surgeon for your diverticulitis    Contact information:   [address] [place] [address] [phone]      Discharge Diagnoses:  1. Sigmoid diverticulitis with abscess x2 2. Colovesical fistula   Surgical Procedure: none  Discharge Condition: stable Disposition: home  Diet recommendation: fulls, advance to bland diet  Filed Weights   03/14/16 1057 03/14/16 2013  Weight: 76.658 kg (169 lb) 76.658 kg (169 lb)    Filed Vitals:   03/17/16 2042 03/18/16 0438  BP: 170/76 153/70  Pulse: 70 76  Temp: 97.6 F (36.4 C) 98.2 F (36.8 C)  Resp: 18 19      Hospital Course:  Alvin Benton is a 65 year old male with a history of diverticulitis since the 1990s.  He recently had several bouts of diverticulitis and was [facility] at [facility] and subsequently discharged.  He presented with worsening lower abdominal pain and pneumaturia.  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 He was admitted for IV antibiotics.  IR did not feel the abscesses were amendable to drainage.  He was placed on bowel rest and Zosyn.  Symptoms improved, diet was advanced and he was transitioned to Augmentin.  I placed him on Florastor.  On HD#4 he was felt stable for discharge home.  Follow up was arranged with Dr. Janee Mornhompson and the patient will have a CT scan done prior to follow up.  We discussed warning signs that warrant further evaluation.    Discharge Instructions     Medication List    STOP taking these  medications        linaclotide 145 MCG Caps capsule  Commonly known as:  LINZESS      TAKE these medications        albuterol 108 (90 Base) MCG/ACT inhaler  Commonly known as:  PROVENTIL HFA;VENTOLIN HFA  Inhale 2 puffs into the lungs every 6 (six) hours as needed for wheezing or shortness of breath.     amoxicillin-clavulanate 875-125 MG tablet  Commonly known as:  AUGMENTIN  Take 1 tablet by mouth every 12 (twelve) hours.     folic acid 1 MG tablet  Commonly known as:  FOLVITE  Take 1 tablet (1 mg total) by mouth daily.     HYDROcodone-acetaminophen 5-325 MG tablet  Commonly known as:  NORCO  Take 1 tablet by mouth 3 (three) times daily as needed for moderate pain or severe pain.     meloxicam 15 MG tablet  Commonly known as:  MOBIC  Take 1 tablet (15 mg total) by mouth daily.     mometasone 50 MCG/ACT nasal spray  Commonly known as:  NASONEX  Place 2 sprays into the nose daily.     saccharomyces boulardii 250 MG capsule  Commonly known as:  FLORASTOR  Take 1 capsule (250 mg total) by mouth 2 (two) times daily.     thiamine 100 MG tablet  Take 100 mg by mouth.     valsartan 160 MG tablet  Commonly known as:  DIOVAN  TAKE 1 TABLET (160  MG TOTAL) BY MOUTH DAILY.           Follow-up Information    Follow up with Liz Malady, MD On 04/03/2016.   Specialty:  General Surgery   Why:  arrive by 10:30AM for a 10:50AM check up with the surgeon for your diverticulitis    Contact information:   [address] [place] [address] [phone]        The results of significant diagnostics from this hospitalization (including imaging, microbiology, ancillary and laboratory) are listed below for reference.    Significant Diagnostic Studies: Ct Abdomen Pelvis W Contrast  03/14/2016  CLINICAL DATA:  Lower abdominal pain with constipation for 3 days. Recent history of diverticulitis. EXAM: CT ABDOMEN AND PELVIS WITH CONTRAST TECHNIQUE: Multidetector CT  imaging of the abdomen and pelvis was performed using the standard protocol following bolus administration of intravenous contrast. CONTRAST:  ISOVUE-300 IOPAMIDOL (ISOVUE-300) INJECTION 61% COMPARISON:  None. FINDINGS: Lower chest:  No acute findings. Hepatobiliary: Liver is diffusely low in density indicating fatty infiltration. Gallbladder is unremarkable. No bile duct dilatation. Pancreas: No mass, inflammatory changes, or other significant abnormality. Spleen: Within normal limits in size and appearance. Adrenals/Urinary Tract: Adrenal glands appear normal. Kidneys are unremarkable without stone or hydronephrosis. No ureteral or bladder calculi identified. Stomach/Bowel: There is marked thickening of the walls of the mid/ upper sigmoid colon, with patulous configuration, most likely sequela of an acute diverticulitis as there is fairly prominent diverticulosis within the adjacent upper sigmoid colon. There is a complex collection of fluid and air posterior to the sigmoid colon, with associated air-fluid levels, measuring 3.9 x 2.3 x 3.7 cm, consistent with abscess. I suspect additional smaller abscess collections above the sigmoid colon. I also suspect small fistulous connections above the sigmoid colon, between the sigmoid colon and surrounding mesentery and/or adjacent small bowel. There is certainly evidence of a fistula between the sigmoid colon and the underlying bladder (prominent bladder wall thickening and air within the bladder). No evidence of bowel obstruction. No free intraperitoneal air within the upper abdomen. Vascular/Lymphatic: Scattered atherosclerotic changes of the normal- caliber abdominal aorta. No acute - appearing vascular abnormality seen. Reproductive: Prostate gland is mildly enlarged causing slight mass effect on the bladder base. Otherwise unremarkable. Other: None. Musculoskeletal: Degenerative changes within the thoracolumbar spine, the fairly mild in degree. No acute or  suspicious osseous lesion identified. IMPRESSION: 1. Marked thickening of the walls of the mid/upper sigmoid colon, involving approximately 14 cm segment of the sigmoid colon, most likely related to an acute diverticulitis as there is diverticulosis seen within the adjacent upper sigmoid colon. 2. Abscess collection posterior to the sigmoid colon measuring 3.9 x 2.3 x 3.7 cm. 3. Probable additional smaller abscess collections above the sigmoid colon. 4. Findings highly suspicious for a fistula between the sigmoid colon and the underlying bladder. There may be additional fistula formation within the soft tissues above the sigmoid colon (cannot exclude developing fistulous connections to overlying small bowel). Recommend immediate surgical consultation. These results were called by telephone at the time of interpretation on 03/14/2016 at 4:55 pm to Dr. Elizabeth Sauer , who verbally acknowledged these results. Electronically Signed   By: Bary Richard M.D.   On: 03/14/2016 16:59    Microbiology: Recent Results (from the past 240 hour(s))  Urine culture     Status: None   Collection Time: 03/14/16  5:20 PM  Result Value Ref Range Status   Specimen Description URINE, RANDOM  Final  Special Requests NONE  Final   Culture NO GROWTH  Final   Report Status 03/15/2016 FINAL  Final     Labs: Basic Metabolic Panel:  Recent Labs Lab 03/14/16 1105 03/15/16 0358  NA 138 137  K 3.5 3.8  CL 100* 97*  CO2 20* 28  GLUCOSE 92 107*  BUN 6 9  CREATININE 0.63 0.67  CALCIUM 8.4* 8.5*   Liver Function Tests:  Recent Labs Lab 03/14/16 1105  AST 28  ALT 18  ALKPHOS 78  BILITOT 0.5  PROT 7.3  ALBUMIN 2.5*    Recent Labs Lab 03/14/16 1105  LIPASE 18   No results for input(s): AMMONIA in the last 168 hours. CBC:  Recent Labs Lab 03/14/16 1105 03/15/16 0358  WBC 10.8* 9.1  HGB 13.2 12.4*  HCT 38.8* 37.9*  MCV 94.6 95.0  PLT 409* 405*   Cardiac Enzymes: No results for input(s): CKTOTAL,  CKMB, CKMBINDEX, TROPONINI in the last 168 hours. BNP: BNP (last 3 results) No results for input(s): BNP in the last 8760 hours.  ProBNP (last 3 results) No results for input(s): PROBNP in the last 8760 hours.  CBG: No results for input(s): GLUCAP in the last 168 hours.  Active Problems:   Diverticulitis of large intestine with perforation and abscess   Time coordinating discharge: <30 mins   Signed:  Raymona Benton, ANP-BC

## 2016-03-19 ENCOUNTER — Telehealth: Payer: Self-pay | Admitting: *Deleted

## 2016-03-19 ENCOUNTER — Other Ambulatory Visit: Payer: Self-pay | Admitting: General Surgery

## 2016-03-19 DIAGNOSIS — K5792 Diverticulitis of intestine, part unspecified, without perforation or abscess without bleeding: Secondary | ICD-10-CM

## 2016-03-19 NOTE — Telephone Encounter (Signed)
Transition Care Management Follow-up Telephone Call Alvin Benton [place]ubo Alvin Benton DOB: May 10, 1951 DOA: 03/14/2016  PCP: Alvin RhymesKatherine Tabori, Alvin Benton  Consultation: none  Admit date: 03/14/2016 Discharge date: 03/18/2016  Recommendations for Outpatient Follow-up:   Follow-up Information    Follow up with Alvin Benton,Alvin Benton, Alvin Benton On 04/03/2016.   Specialty: General Surgery   Why: arrive by 10:30AM for a 10:50AM check up with the surgeon for your diverticulitis    Contact information:   [address] [place] [address] [phone]      Discharge Diagnoses:  1. Sigmoid diverticulitis with abscess x2 2. Colovesical fistula   Surgical Procedure: none  Discharge Condition: stable Disposition: home  Diet recommendation: fulls, advance to bland diet         How have you been since you were released from the hospital? "A little tired, a little run down. A little bit of pain, but t hey gave me some hydrocodone so I'm working with that."   Do you understand why you were in the hospital? yes   Do you understand the discharge instructions? yes   Where were you discharged to? Home   Items Reviewed:  Medications reviewed: yes  Allergies reviewed: yes  Dietary changes reviewed: yes, still on full liquid diet, cannot tolerate advancing diet  Referrals reviewed: yes, general surgery (Dr. Violeta Gelinashompson Alvin)   Functional Questionnaire:   Activities of Daily Living (ADLs):   He states they are independent in the following: ambulation, bathing and hygiene, feeding, continence, grooming, toileting and dressing States they require assistance with the following: none   Any transportation issues/concerns?: no   Any patient concerns? Yes, pt did not receive rx for Florastor on discharge. [facility] if appropriate.   Confirmed importance and date/time of follow-up visits scheduled yes  Provider Appointment booked with Dr. Neena RhymesKatherine  Benton 03/26/16 @ 11:00am  Confirmed with patient if condition begins to worsen call PCP or go to the ER.  Patient was given the office number and encouraged to call back with question or concerns.  : yes

## 2016-03-26 ENCOUNTER — Ambulatory Visit: Payer: Managed Care, Other (non HMO) | Admitting: Family Medicine

## 2016-04-01 ENCOUNTER — Ambulatory Visit
Admission: RE | Admit: 2016-04-01 | Discharge: 2016-04-01 | Disposition: A | Discharge status: Home / Self Care | Payer: Managed Care, Other (non HMO) | Source: Ambulatory Visit | Attending: General Surgery | Admitting: General Surgery

## 2016-04-01 DIAGNOSIS — K5792 Diverticulitis of intestine, part unspecified, without perforation or abscess without bleeding: Secondary | ICD-10-CM

## 2016-04-01 MED ORDER — IOPAMIDOL (ISOVUE-300) INJECTION 61%
100.0000 mL | Freq: Once | INTRAVENOUS | Status: AC | PRN
  Administered 2016-04-01: 100 mL via INTRAVENOUS

## 2016-04-04 ENCOUNTER — Emergency Department (HOSPITAL_COMMUNITY): Payer: Managed Care, Other (non HMO)

## 2016-04-04 ENCOUNTER — Inpatient Hospital Stay (HOSPITAL_COMMUNITY)
Admission: EM | Admit: 2016-04-04 | Discharge: 2016-04-11 | DRG: 391 | Disposition: A | Discharge status: Home / Self Care | Payer: Managed Care, Other (non HMO) | Attending: Internal Medicine | Admitting: Internal Medicine

## 2016-04-04 ENCOUNTER — Encounter (HOSPITAL_COMMUNITY): Payer: Self-pay | Admitting: Family Medicine

## 2016-04-04 DIAGNOSIS — F1010 Alcohol abuse, uncomplicated: Secondary | ICD-10-CM | POA: Diagnosis not present

## 2016-04-04 DIAGNOSIS — I4891 Unspecified atrial fibrillation: Secondary | ICD-10-CM | POA: Diagnosis not present

## 2016-04-04 DIAGNOSIS — F10239 Alcohol dependence with withdrawal, unspecified: Secondary | ICD-10-CM | POA: Diagnosis present

## 2016-04-04 DIAGNOSIS — N321 Vesicointestinal fistula: Secondary | ICD-10-CM | POA: Diagnosis present

## 2016-04-04 DIAGNOSIS — I10 Essential (primary) hypertension: Secondary | ICD-10-CM | POA: Diagnosis present

## 2016-04-04 DIAGNOSIS — K589 Irritable bowel syndrome without diarrhea: Secondary | ICD-10-CM | POA: Diagnosis present

## 2016-04-04 DIAGNOSIS — R109 Unspecified abdominal pain: Secondary | ICD-10-CM | POA: Diagnosis not present

## 2016-04-04 DIAGNOSIS — E860 Dehydration: Secondary | ICD-10-CM | POA: Diagnosis present

## 2016-04-04 DIAGNOSIS — J9601 Acute respiratory failure with hypoxia: Secondary | ICD-10-CM | POA: Diagnosis present

## 2016-04-04 DIAGNOSIS — K5792 Diverticulitis of intestine, part unspecified, without perforation or abscess without bleeding: Principal | ICD-10-CM | POA: Diagnosis present

## 2016-04-04 DIAGNOSIS — R0902 Hypoxemia: Secondary | ICD-10-CM | POA: Diagnosis present

## 2016-04-04 DIAGNOSIS — F419 Anxiety disorder, unspecified: Secondary | ICD-10-CM | POA: Diagnosis present

## 2016-04-04 DIAGNOSIS — K5720 Diverticulitis of large intestine with perforation and abscess without bleeding: Secondary | ICD-10-CM | POA: Diagnosis not present

## 2016-04-04 DIAGNOSIS — R0682 Tachypnea, not elsewhere classified: Secondary | ICD-10-CM | POA: Diagnosis present

## 2016-04-04 DIAGNOSIS — F17210 Nicotine dependence, cigarettes, uncomplicated: Secondary | ICD-10-CM | POA: Diagnosis present

## 2016-04-04 DIAGNOSIS — N390 Urinary tract infection, site not specified: Secondary | ICD-10-CM | POA: Diagnosis present

## 2016-04-04 LAB — COMPREHENSIVE METABOLIC PANEL
ALBUMIN: 3.2 g/dL — AB (ref 3.5–5.0)
ALT: 36 U/L (ref 17–63)
ANION GAP: 18 — AB (ref 5–15)
AST: 63 U/L — AB (ref 15–41)
Alkaline Phosphatase: 95 U/L (ref 38–126)
BILIRUBIN TOTAL: 1.5 mg/dL — AB (ref 0.3–1.2)
CO2: 19 mmol/L — AB (ref 22–32)
Calcium: 9.2 mg/dL (ref 8.9–10.3)
Chloride: 103 mmol/L (ref 101–111)
Creatinine, Ser: 1.15 mg/dL (ref 0.61–1.24)
GFR calc Af Amer: >60 mL/min (ref >60)
GFR calc non Af Amer: >60 mL/min (ref >60)
GLUCOSE: 142 mg/dL — AB (ref 65–99)
POTASSIUM: 4.4 mmol/L (ref 3.5–5.1)
SODIUM: 140 mmol/L (ref 135–145)
TOTAL PROTEIN: 7.6 g/dL (ref 6.5–8.1)

## 2016-04-04 LAB — I-STAT CG4 LACTIC ACID, ED
Lactic Acid, Venous: 3.37 mmol/L (ref 0.5–2.0)
Lactic Acid, Venous: 1.08 mmol/L (ref 0.5–2.0)

## 2016-04-04 LAB — URINE MICROSCOPIC-ADD ON

## 2016-04-04 LAB — URINALYSIS, ROUTINE W REFLEX MICROSCOPIC
GLUCOSE, UA: NEGATIVE mg/dL
NITRITE: NEGATIVE
PH: 7 (ref 5.0–8.0)
Protein, ur: 100 mg/dL — AB
SPECIFIC GRAVITY, URINE: 1.024 (ref 1.005–1.030)

## 2016-04-04 LAB — CBC
HEMATOCRIT: 43.6 % (ref 39.0–52.0)
HEMOGLOBIN: 14.2 g/dL (ref 13.0–17.0)
MCH: 32.2 pg (ref 26.0–34.0)
MCHC: 32.6 g/dL (ref 30.0–36.0)
MCV: 98.9 fL (ref 78.0–100.0)
Platelets: 323 10*3/uL (ref 150–400)
RBC: 4.41 MIL/uL (ref 4.22–5.81)
RDW: 16.2 % — AB (ref 11.5–15.5)
WBC: 15 10*3/uL — ABNORMAL HIGH (ref 4.0–10.5)

## 2016-04-04 LAB — LIPASE, BLOOD: Lipase: 41 U/L (ref 11–51)

## 2016-04-04 MED ORDER — DILTIAZEM HCL 25 MG/5ML IV SOLN
10.0000 mg | Freq: Once | INTRAVENOUS | Status: AC
  Administered 2016-04-04: 10 mg via INTRAVENOUS
  Filled 2016-04-04: qty 5

## 2016-04-04 MED ORDER — SODIUM CHLORIDE 0.9 % IV SOLN
INTRAVENOUS | Status: DC
  Administered 2016-04-04 – 2016-04-05 (×3): via INTRAVENOUS
  Administered 2016-04-06: 1000 mL via INTRAVENOUS
  Administered 2016-04-06: 04:00:00 via INTRAVENOUS

## 2016-04-04 MED ORDER — ONDANSETRON HCL 4 MG/2ML IJ SOLN: 4.0000 mg | Freq: Once | INTRAMUSCULAR | Status: DC | PRN

## 2016-04-04 MED ORDER — IOPAMIDOL (ISOVUE-300) INJECTION 61%
INTRAVENOUS | Status: AC
  Administered 2016-04-04: 100 mL
  Filled 2016-04-04: qty 100

## 2016-04-04 MED ORDER — SODIUM CHLORIDE 0.9 % IV BOLUS (SEPSIS)
1000.0000 mL | Freq: Once | INTRAVENOUS | Status: AC
  Administered 2016-04-04: 1000 mL via INTRAVENOUS

## 2016-04-04 MED ORDER — PIPERACILLIN-TAZOBACTAM 3.375 G IVPB 30 MIN
3.3750 g | Freq: Once | INTRAVENOUS | Status: AC
  Administered 2016-04-04: 3.375 g via INTRAVENOUS
  Filled 2016-04-04: qty 50

## 2016-04-04 MED ORDER — HYDROMORPHONE HCL 1 MG/ML IJ SOLN
1.0000 mg | Freq: Once | INTRAMUSCULAR | Status: AC
  Administered 2016-04-04: 1 mg via INTRAVENOUS
  Filled 2016-04-04: qty 1

## 2016-04-04 MED ORDER — ONDANSETRON HCL 4 MG/2ML IJ SOLN
4.0000 mg | Freq: Once | INTRAMUSCULAR | Status: AC
  Administered 2016-04-04: 4 mg via INTRAVENOUS
  Filled 2016-04-04: qty 2

## 2016-04-04 MED ORDER — LORAZEPAM 2 MG/ML IJ SOLN
0.5000 mg | Freq: Once | INTRAMUSCULAR | Status: AC
  Administered 2016-04-04: 0.5 mg via INTRAVENOUS
  Filled 2016-04-04: qty 1

## 2016-04-04 MED ORDER — DIATRIZOATE MEGLUMINE & SODIUM 66-10 % PO SOLN
ORAL | Status: AC
  Filled 2016-04-04: qty 30

## 2016-04-04 MED ORDER — SODIUM CHLORIDE 0.9 % IV BOLUS (SEPSIS)
2000.0000 mL | Freq: Once | INTRAVENOUS | Status: AC
  Administered 2016-04-04: 2000 mL via INTRAVENOUS

## 2016-04-04 NOTE — H&P (Addendum)
History and Physical    Alvin Benton ZOX:[id number] DOB: April 03, 1951 DOA: 04/04/2016  Referring MD/NP/PA: Dr. Freida Busman PCP: Neena Rhymes, MD  Patient coming from: Home  Chief Complaint: Abdominal pain, nausea, and vomiting   HPI: Alvin Benton is a 65 y.o. male with medical history significant of IBS, diverticulitis, HTN; who presents with complaints of abdominal pain, nausea, and vomiting. Patient states that he had been having some lower abdominal pain progressively worsened up until today. Pain was described as sharp and he was unable to keep any food or liquids down. He reports vomiting anywhere from 10-15 times of just gastric contents. Denies having any hematemesis, fever, shortness of breath, or blood in stools.. Associated symptoms include chills, dizziness, and constipation. He reports being chronically constipated for which he takes Metamucil and has only really been having like 1-2 bowel movements per day. He expresses that he has been dealing with intermittent bouts of diverticulitis for 20 years. Notes that with the recent episode he's had very hospitalization since 11/2015. 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 Patient reports that since he had been discharged she had been taking Augmentin as advised. He admits to smoking 1 pack of cigarettes per day on average as well as alcohol use. Last drink was possibly a few days ago. He states that he said been more anxious and tremulous over the last few months.  ED Course: Upon admission to the emergency department patient was seen to be afebrile, heart rates up to 153, respirations up to 30, blood pressures maintained, oxygen saturation as low as 84%. Lab work revealed WBC 15, CO2 19, BUN <5, and a lactic acid 3.37. Chest x-ray was unremarkable. CT of the abdomen revealed continued diverticulitis with suspicion for colovesical fistula. Sepsis  protocol was initiated initially started on antibiotics of Zosyn. Patient significantly tremulous and somewhat agitated in the ED which gave concern for possible withdrawal from possibly alcohol as UDS was positive only for opiates.  Review of Systems: As per HPI otherwise 10 point review of systems negative.   Past Medical History  Diagnosis Date  . Blood in stool   . IBS (irritable bowel syndrome)   . Diverticulitis   . Hypertension   . Migraine     "none in a long time" (03/14/2016)    Past Surgical History  Procedure Laterality Date  . Colonoscopy    . Tonsillectomy       reports that he has been smoking Cigarettes.  He has a 40 pack-year smoking history. He has never used smokeless tobacco. He reports that he drinks about 4.8 oz of alcohol per week. He reports that he does not use illicit drugs.  No Known Allergies  Family History  Problem Relation Age of Onset  . Stroke Mother   . Colon cancer Neg Hx   . Esophageal cancer Neg Hx   . Rectal cancer Neg Hx   . Stomach cancer Neg Hx     Prior to Admission medications   Medication Sig Start Date End Date Taking? Authorizing Provider  albuterol (PROVENTIL HFA;VENTOLIN HFA) 108 (90 BASE) MCG/ACT inhaler Inhale 2 puffs into the lungs every 6 (six) hours as needed for wheezing or shortness of breath. 07/17/15  Yes Sheliah Hatch, MD  amoxicillin-clavulanate (AUGMENTIN) 875-125 MG tablet Take 1 tablet by mouth every 12 (twelve) hours. 03/18/16  Yes Emina Riebock, NP  folic acid (FOLVITE) 1 MG tablet Take 1  tablet (1 mg total) by mouth daily. 03/08/16 03/08/17 Yes Sheliah Hatch, MD  HYDROcodone-acetaminophen (NORCO) 5-325 MG tablet Take 1 tablet by mouth 3 (three) times daily as needed for moderate pain or severe pain. 03/18/16  Yes Emina Riebock, NP  mometasone (NASONEX) 50 MCG/ACT nasal spray Place 2 sprays into the nose daily. Patient taking differently: Place 2 sprays into the nose daily as needed. For allergies 07/17/15  Yes  Sheliah Hatch, MD  Multiple Vitamin (MULTIVITAMIN WITH MINERALS) TABS tablet Take 1 tablet by mouth once a week.   Yes Historical Provider, MD  valsartan (DIOVAN) 160 MG tablet TAKE 1 TABLET (160 MG TOTAL) BY MOUTH DAILY. 10/27/15  Yes Sheliah Hatch, MD    Physical Exam:  Constitutional: Older male appears to be an art distress unable to get comfortable  Filed Vitals:   04/04/16 2300 04/04/16 2315 04/04/16 2340 04/04/16 2342  BP: 122/88 140/88 118/84   Pulse: 128 132 134 114  Temp:      TempSrc:      Resp: 25 23 23 27   SpO2: 93% 92% 90% 95%   Eyes: PERRL, lids and conjunctivae normal ENMT: Mucous membranes are dry. Posterior pharynx clear of any exudate or lesions.Normal dentition.  Neck: normal, supple, no masses, no thyromegaly Respiratory: clear to auscultation bilaterally, no wheezing, no crackles. Normal respiratory effort. No accessory muscle use.  Cardiovascular: Irregularly irregular.  Trace lower extremity edema. 2+ pedal pulses. No carotid bruits.  Abdomen:Moderate tenderness to palpation of the abdomen most notably in the lower quadrants. no masses palpated. No hepatosplenomegaly. Bowel sounds positive.  Musculoskeletal: no clubbing / cyanosis. No joint deformity upper and lower extremities. Good ROM, no contractures. Normal muscle tone.  Skin: no rashes, lesions, ulcers. No induration Neurologic: CN 2-12 grossly intact. Sensation intact, DTR normal. Strength 5/5 in all 4. Patient Tremulous unable to sit still.  Psychiatric: Normal judgment and insight. Alert and oriented x 3. Anxious mood.     Labs on Admission: I have personally reviewed following labs and imaging studies  CBC:  Recent Labs Lab 04/04/16 1809  WBC 15.0*  HGB 14.2  HCT 43.6  MCV 98.9  PLT 323   Basic Metabolic Panel:  Recent Labs Lab 04/04/16 1809  NA 140  K 4.4  CL 103  CO2 19*  GLUCOSE 142*  BUN <5*  CREATININE 1.15  CALCIUM 9.2   GFR: CrCl cannot be calculated (Unknown  ideal weight.). Liver Function Tests:  Recent Labs Lab 04/04/16 1809  AST 63*  ALT 36  ALKPHOS 95  BILITOT 1.5*  PROT 7.6  ALBUMIN 3.2*    Recent Labs Lab 04/04/16 1809  LIPASE 41   No results for input(s): AMMONIA in the last 168 hours. Coagulation Profile: No results for input(s): INR, PROTIME in the last 168 hours. Cardiac Enzymes: No results for input(s): CKTOTAL, CKMB, CKMBINDEX, TROPONINI in the last 168 hours. BNP (last 3 results) No results for input(s): PROBNP in the last 8760 hours. HbA1C: No results for input(s): HGBA1C in the last 72 hours. CBG: No results for input(s): GLUCAP in the last 168 hours. Lipid Profile: No results for input(s): CHOL, HDL, LDLCALC, TRIG, CHOLHDL, LDLDIRECT in the last 72 hours. Thyroid Function Tests: No results for input(s): TSH, T4TOTAL, FREET4, T3FREE, THYROIDAB in the last 72 hours. Anemia Panel: No results for input(s): VITAMINB12, FOLATE, FERRITIN, TIBC, IRON, RETICCTPCT in the last 72 hours. Urine analysis:    Component Value Date/Time   COLORURINE AMBER* 04/04/2016 2129  APPEARANCEUR TURBID* 04/04/2016 2129   LABSPEC 1.024 04/04/2016 2129   PHURINE 7.0 04/04/2016 2129   GLUCOSEU NEGATIVE 04/04/2016 2129   HGBUR MODERATE* 04/04/2016 2129   BILIRUBINUR SMALL* 04/04/2016 2129   KETONESUR >80* 04/04/2016 2129   PROTEINUR 100* 04/04/2016 2129   NITRITE NEGATIVE 04/04/2016 2129   LEUKOCYTESUR LARGE* 04/04/2016 2129   Sepsis Labs: No results found for this or any previous visit (from the past 240 hour(s)).   Radiological Exams on Admission: Ct Abdomen Pelvis W Contrast  04/04/2016  CLINICAL DATA:  Diverticulitis with persistent nausea and vomiting EXAM: CT ABDOMEN AND PELVIS WITH CONTRAST TECHNIQUE: Multidetector CT imaging of the abdomen and pelvis was performed using the standard protocol following bolus administration of intravenous contrast. CONTRAST:  ISOVUE-300 IOPAMIDOL (ISOVUE-300) INJECTION 61% COMPARISON:   04/01/2016 FINDINGS: The lung bases are free of acute infiltrate or sizable effusion. The liver demonstrates fatty infiltration. The spleen, adrenal glands, pancreas and gallbladder are all normal in their CT appearance. Kidneys again demonstrate a normal enhancement pattern. Stomach is well distended with contrast material. The visualized small bowel is within normal limits. The appendix is within normal limits. No inflammatory changes are seen. There remain diverticular change of the colon as well as significant length of sigmoid colon with circumferential wall thickening. No obstructive changes are seen. A tiny air-fluid collection is again identified adjacent to colon best seen on image number 78 of series 2. This measures 16 mm in greatest dimension and is again decreased from the previous exam. The bladder is partially distended and again demonstrates some air within. The possibility of a colovesical fistula would deserve consideration. Inflammatory changes are also noted extending towards adjacent small bowel loops similar to that seen on the prior exam. No free pelvic fluid is noted.  No acute bony abnormality is seen. IMPRESSION: Persistent changes of diverticulitis/ colitis in the sigmoid colon. Interval slight decrease in the air-fluid collection adjacent to colon is noted. Additionally some inflammatory change is again identified extending towards adjacent loops of small bowel similar to that noted on the prior exam. There is some thickening of the bladder wall with air within suspicious of colovesical fistula. Electronically Signed   By: Alcide Clever M.D.   On: 04/04/2016 22:57   Dg Chest Port 1 View  04/04/2016  CLINICAL DATA:  Shortness of breath and hypoxia EXAM: PORTABLE CHEST 1 VIEW COMPARISON:  None. FINDINGS: Cardiac shadow is within normal limits. The lungs are clear bilaterally. No acute bony abnormality is seen. IMPRESSION: No acute abnormality noted. Electronically Signed   By: Alcide Clever  M.D.   On: 04/04/2016 23:23    EKG: Independently reviewed. 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 Patient with complaints of abdominal pain acutely worsened with nausea and vomiting. CT scan continues to show diverticulitis with continued colovesical fistula. - Admit to stepdown - NPO to allow for bowel rest - Follow-up blood and urine cultures - Continue antibiotics of Zosyn -  Normal saline IV fluids at 125  - Will need to consult general surgery in am.  Atrial fibrillation: Acute. Suspect secondary to underlying infection. Diltiazem 10 mg given in ED from an apartment to less than 110. chadvasc score 1 - Check TSH - Diltiazem prn HR 110< - Echocardiogram  Hypoxia. Patient denies feeling short of breath O2 sats 84% on room air. Etiology unclear at this time. Patient continues to deny any shortness of breath. Will try and  have nursing staff change out the pulse oximetry. - Continuous pulse oximetry with nasal cannula oxygen to keep O2 sats greater than 92%. - Check repeat chest x-ray in a.m.   Urinary tract infection secondary to colovesical fistula: UA positive on admission for rare bacteria with large leukocytes, 6-30 RBCs, 0-5 squamous cells, and too numerous to count WBCs. - Follow-up urine culture  Essential hypertension - continue to  monitor  Nausea and vomiting - Zofran prn  Alcohol abuse: Question the patient was actively undergoing acute alcohol withdrawals as he has not been able to drink much lately due to abdominal pain -  CWIAA  Tobacco abuse - Nicotine patch offered   DVT prophylaxis: SCDs Code Status: Full Family Communication: none Disposition Plan:  Consults called: None  Admission status: Inpatient Stepdown  Clydie Braunondell A Aiyana Stegmann MD [facility] Pager [phone]- [phone]  If 7PM-7AM, please contact night-coverage [email or web address] Password [email or web address]  04/04/2016, 11:58 PM

## 2016-04-04 NOTE — ED Notes (Signed)
Rounded on patient. Pt alert and oriented states he still feels dizzy. Charge RN aware. Pt will have next available room.

## 2016-04-04 NOTE — ED Provider Notes (Signed)
CSN: [id number]     Arrival date & time 04/04/16  1758 History   First MD Initiated Contact with Patient 04/04/16 1939     Chief Complaint  Patient presents with  . Abdominal Pain  . Nausea  . Vomiting     (Consider location/radiation/quality/duration/timing/severity/associated sxs/prior Treatment) HPI Comments: Patient here worsening left lower quadrant abdominal pain times several days. Has a history of sigmoid diverticulitis with abscess formation and was discharged from the hospital several weeks ago for this. Endorses nonbilious emesis without black or bloody stools. Abdominal pain is persistent and worse with movement. Denies any urinary symptoms at this time. Has had subjective fever and chills at home.  Patient is a 65 y.o. male presenting with abdominal pain. The history is provided by the patient.  Abdominal Pain   Past Medical History  Diagnosis Date  . Blood in stool   . IBS (irritable bowel syndrome)   . Diverticulitis   . Hypertension   . Migraine     "none in a long time" (03/14/2016)   Past Surgical History  Procedure Laterality Date  . Colonoscopy    . Tonsillectomy     Family History  Problem Relation Age of Onset  . Stroke Mother   . Colon cancer Neg Hx   . Esophageal cancer Neg Hx   . Rectal cancer Neg Hx   . Stomach cancer Neg Hx    Social History  Substance Use Topics  . Smoking status: Current Every Day Smoker -- 1.00 packs/day for 40 years    Types: Cigarettes  . Smokeless tobacco: Never Used  . Alcohol Use: 4.8 oz/week    8 Cans of beer per week    Review of Systems  Gastrointestinal: Positive for abdominal pain.  All other systems reviewed and are negative.     Allergies  Review of patient's allergies indicates no known allergies.  Home Medications   Prior to Admission medications   Medication Sig Start Date End Date Taking? Authorizing Provider  albuterol (PROVENTIL HFA;VENTOLIN HFA) 108 (90 BASE) MCG/ACT inhaler Inhale 2 puffs  into the lungs every 6 (six) hours as needed for wheezing or shortness of breath. 07/17/15   Sheliah HatchKatherine E Tabori, MD  amoxicillin-clavulanate (AUGMENTIN) 875-125 MG tablet Take 1 tablet by mouth every 12 (twelve) hours. 03/18/16   Ashok NorrisEmina Riebock, NP  folic acid (FOLVITE) 1 MG tablet Take 1 tablet (1 mg total) by mouth daily. 03/08/16 03/08/17  Sheliah HatchKatherine E Tabori, MD  HYDROcodone-acetaminophen (NORCO) 5-325 MG tablet Take 1 tablet by mouth 3 (three) times daily as needed for moderate pain or severe pain. 03/18/16   Emina Riebock, NP  mometasone (NASONEX) 50 MCG/ACT nasal spray Place 2 sprays into the nose daily. Patient taking differently: Place 2 sprays into the nose daily as needed. For allergies 07/17/15   Sheliah HatchKatherine E Tabori, MD  saccharomyces boulardii (FLORASTOR) 250 MG capsule Take 1 capsule (250 mg total) by mouth 2 (two) times daily. 03/18/16   Emina Riebock, NP  thiamine 100 MG tablet Take 100 mg by mouth. 02/04/16 02/03/17  Historical Provider, MD  valsartan (DIOVAN) 160 MG tablet TAKE 1 TABLET (160 MG TOTAL) BY MOUTH DAILY. 10/27/15   Sheliah HatchKatherine E Tabori, MD   BP 103/28 mmHg  Pulse 55  Temp(Src) 97.9 F (36.6 C) (Oral)  Resp 28  SpO2 100% Physical Exam  Constitutional: He is oriented to person, place, and time. He appears well-developed and well-nourished.  Non-toxic appearance. No distress.  HENT:  Head: Normocephalic and atraumatic.  Eyes: Conjunctivae, EOM and lids are normal. Pupils are equal, round, and reactive to light.  Neck: Normal range of motion. Neck supple. No tracheal deviation present. No thyroid mass present.  Cardiovascular: Normal heart sounds.  An irregularly irregular rhythm present. Tachycardia present.  Exam reveals no gallop.   No murmur heard. Pulmonary/Chest: Effort normal and breath sounds normal. No stridor. No respiratory distress. He has no decreased breath sounds. He has no wheezes. He has no rhonchi. He has no rales.  Abdominal: Soft. Normal appearance and bowel  sounds are normal. He exhibits no distension. There is tenderness in the left lower quadrant. There is guarding. There is no rigidity, no rebound and no CVA tenderness.    Musculoskeletal: Normal range of motion. He exhibits no edema or tenderness.  Neurological: He is alert and oriented to person, place, and time. He displays tremor. No cranial nerve deficit or sensory deficit. GCS eye subscore is 4. GCS verbal subscore is 5. GCS motor subscore is 6.  Skin: Skin is warm and dry. No abrasion and no rash noted.  Psychiatric: He has a normal mood and affect. His speech is normal and behavior is normal.  Nursing note and vitals reviewed.   ED Course  Procedures (including critical care time) Labs Review Labs Reviewed  COMPREHENSIVE METABOLIC PANEL - Abnormal; Notable for the following:    CO2 19 (*)    Glucose, Bld 142 (*)    BUN <5 (*)    Albumin 3.2 (*)    AST 63 (*)    Total Bilirubin 1.5 (*)    Anion gap 18 (*)    All other components within normal limits  CBC - Abnormal; Notable for the following:    WBC 15.0 (*)    RDW 16.2 (*)    All other components within normal limits  CULTURE, BLOOD (ROUTINE X 2)  CULTURE, BLOOD (ROUTINE X 2)  LIPASE, BLOOD  URINALYSIS, ROUTINE W REFLEX MICROSCOPIC (NOT AT [facility])  I-STAT CG4 LACTIC ACID, ED    Imaging Review No results found. I have personally reviewed and evaluated these images and lab results as part of my medical decision-making.   EKG Interpretation   Date/Time:  Thursday April 04 2016 19:48:08 EDT Ventricular Rate:  157 PR Interval:    QRS Duration: 85 QT Interval:  304 QTC Calculation: 491 R Axis:   38 Text Interpretation:  Atrial fibrillation Borderline prolonged QT interval  Confirmed by Freida Busman  MD, Terrel Manalo ([id number]) on 04/04/2016 8:51:16 PM      MDM   Final diagnoses:  None    Patient given IV fluids as well as antibiotics for suspected diverticulitis. Blood cultures and lactate were ordered and the lactic acid  3.37. EKG shows new onset age of fibrillation. There is rapid ventricular response. This was treated with Cardizem 10 mg IV push. Patient denies being short of breath at this time. He has had no cough or congestion. Patient does have pyuria but he also has a history of a colovesicular fistula. Repeat lactate acid has been ordered. Patient's abdominal CT does not show any increase in size of his intra-abdominal abscess. Urinalysis also does show dehydration. Will further bolused with fluid. Will admit to medicine  CRITICAL CARE Performed by: Toy Baker Total critical care time: 50 minutes Critical care time was exclusive of separately billable procedures and treating other patients. Critical care was necessary to treat or prevent imminent or life-threatening deterioration. Critical care was time spent personally by me on the following  activities: development of treatment plan with patient and/or surrogate as well as nursing, discussions with consultants, evaluation of patient's response to treatment, examination of patient, obtaining history from patient or surrogate, ordering and performing treatments and interventions, ordering and review of laboratory studies, ordering and review of radiographic studies, pulse oximetry and re-evaluation of patient's condition.     Lorre Nick, MD 04/04/16 [phone]

## 2016-04-04 NOTE — ED Notes (Signed)
Pt here for abd pain, N,V,D since yesterday and worsening. sts diverticulitis flare up.

## 2016-04-04 NOTE — ED Notes (Signed)
Pt was given H2O.

## 2016-04-04 NOTE — ED Notes (Signed)
O2 sats still 88%, O2 inc to 5L [place], pt in room sleeping

## 2016-04-04 NOTE — ED Notes (Signed)
Pt placed on 3L [place], O2 sats 84-86% RA while pt was sleeping

## 2016-04-04 NOTE — ED Notes (Signed)
EDP at bedside  

## 2016-04-05 ENCOUNTER — Inpatient Hospital Stay (HOSPITAL_COMMUNITY): Payer: Managed Care, Other (non HMO)

## 2016-04-05 ENCOUNTER — Encounter (HOSPITAL_COMMUNITY): Payer: Self-pay | Admitting: General Practice

## 2016-04-05 DIAGNOSIS — R109 Unspecified abdominal pain: Secondary | ICD-10-CM | POA: Diagnosis present

## 2016-04-05 DIAGNOSIS — F1010 Alcohol abuse, uncomplicated: Secondary | ICD-10-CM | POA: Diagnosis not present

## 2016-04-05 DIAGNOSIS — I4891 Unspecified atrial fibrillation: Secondary | ICD-10-CM | POA: Diagnosis present

## 2016-04-05 DIAGNOSIS — I480 Paroxysmal atrial fibrillation: Secondary | ICD-10-CM | POA: Diagnosis not present

## 2016-04-05 DIAGNOSIS — K5700 Diverticulitis of small intestine with perforation and abscess without bleeding: Secondary | ICD-10-CM | POA: Diagnosis not present

## 2016-04-05 DIAGNOSIS — K5720 Diverticulitis of large intestine with perforation and abscess without bleeding: Secondary | ICD-10-CM | POA: Diagnosis not present

## 2016-04-05 DIAGNOSIS — F17210 Nicotine dependence, cigarettes, uncomplicated: Secondary | ICD-10-CM | POA: Diagnosis present

## 2016-04-05 DIAGNOSIS — F419 Anxiety disorder, unspecified: Secondary | ICD-10-CM | POA: Diagnosis present

## 2016-04-05 DIAGNOSIS — K589 Irritable bowel syndrome without diarrhea: Secondary | ICD-10-CM | POA: Diagnosis present

## 2016-04-05 DIAGNOSIS — R0682 Tachypnea, not elsewhere classified: Secondary | ICD-10-CM | POA: Diagnosis present

## 2016-04-05 DIAGNOSIS — J9601 Acute respiratory failure with hypoxia: Secondary | ICD-10-CM | POA: Diagnosis present

## 2016-04-05 DIAGNOSIS — N321 Vesicointestinal fistula: Secondary | ICD-10-CM | POA: Diagnosis present

## 2016-04-05 DIAGNOSIS — K5780 Diverticulitis of intestine, part unspecified, with perforation and abscess without bleeding: Secondary | ICD-10-CM | POA: Diagnosis not present

## 2016-04-05 DIAGNOSIS — K5792 Diverticulitis of intestine, part unspecified, without perforation or abscess without bleeding: Secondary | ICD-10-CM | POA: Diagnosis present

## 2016-04-05 DIAGNOSIS — N390 Urinary tract infection, site not specified: Secondary | ICD-10-CM | POA: Diagnosis present

## 2016-04-05 DIAGNOSIS — F10239 Alcohol dependence with withdrawal, unspecified: Secondary | ICD-10-CM | POA: Diagnosis present

## 2016-04-05 DIAGNOSIS — R0902 Hypoxemia: Secondary | ICD-10-CM | POA: Diagnosis present

## 2016-04-05 DIAGNOSIS — I10 Essential (primary) hypertension: Secondary | ICD-10-CM | POA: Diagnosis present

## 2016-04-05 DIAGNOSIS — E860 Dehydration: Secondary | ICD-10-CM | POA: Diagnosis present

## 2016-04-05 DIAGNOSIS — K5732 Diverticulitis of large intestine without perforation or abscess without bleeding: Secondary | ICD-10-CM | POA: Diagnosis not present

## 2016-04-05 LAB — COMPREHENSIVE METABOLIC PANEL
ALBUMIN: 2.5 g/dL — AB (ref 3.5–5.0)
ALK PHOS: 73 U/L (ref 38–126)
ALT: 24 U/L (ref 17–63)
ANION GAP: 9 (ref 5–15)
AST: 39 U/L (ref 15–41)
BUN: 6 mg/dL (ref 6–20)
CHLORIDE: 105 mmol/L (ref 101–111)
CO2: 20 mmol/L — AB (ref 22–32)
Calcium: 8.1 mg/dL — ABNORMAL LOW (ref 8.9–10.3)
Creatinine, Ser: 0.82 mg/dL (ref 0.61–1.24)
GFR calc Af Amer: >60 mL/min (ref >60)
GFR calc non Af Amer: >60 mL/min (ref >60)
GLUCOSE: 85 mg/dL (ref 65–99)
POTASSIUM: 3.7 mmol/L (ref 3.5–5.1)
SODIUM: 134 mmol/L — AB (ref 135–145)
Total Bilirubin: 1 mg/dL (ref 0.3–1.2)
Total Protein: 5.8 g/dL — ABNORMAL LOW (ref 6.5–8.1)

## 2016-04-05 LAB — RAPID URINE DRUG SCREEN, HOSP PERFORMED
AMPHETAMINES: NOT DETECTED
BARBITURATES: NOT DETECTED
BENZODIAZEPINES: NOT DETECTED
Cocaine: NOT DETECTED
Opiates: POSITIVE — AB
TETRAHYDROCANNABINOL: NOT DETECTED

## 2016-04-05 LAB — CBC
HCT: 38.3 % — ABNORMAL LOW (ref 39.0–52.0)
HEMOGLOBIN: 12.1 g/dL — AB (ref 13.0–17.0)
MCH: 31.1 pg (ref 26.0–34.0)
MCHC: 31.6 g/dL (ref 30.0–36.0)
MCV: 98.5 fL (ref 78.0–100.0)
PLATELETS: 246 10*3/uL (ref 150–400)
RBC: 3.89 MIL/uL — ABNORMAL LOW (ref 4.22–5.81)
RDW: 16.7 % — ABNORMAL HIGH (ref 11.5–15.5)
WBC: 13 10*3/uL — ABNORMAL HIGH (ref 4.0–10.5)

## 2016-04-05 LAB — I-STAT CG4 LACTIC ACID, ED: Lactic Acid, Venous: 1.09 mmol/L (ref 0.5–2.0)

## 2016-04-05 LAB — PREALBUMIN: Prealbumin: 22.1 mg/dL (ref 18–38)

## 2016-04-05 LAB — APTT: APTT: 31 s (ref 24–37)

## 2016-04-05 LAB — PROTIME-INR
INR: 1.17 (ref 0.00–1.49)
Prothrombin Time: 15.1 seconds (ref 11.6–15.2)

## 2016-04-05 MED ORDER — HYDROMORPHONE HCL 1 MG/ML IJ SOLN
1.0000 mg | INTRAMUSCULAR | Status: DC | PRN
  Administered 2016-04-05 – 2016-04-07 (×11): 1 mg via INTRAVENOUS
  Filled 2016-04-05 (×11): qty 1

## 2016-04-05 MED ORDER — FOLIC ACID 1 MG PO TABS
1.0000 mg | ORAL_TABLET | Freq: Every day | ORAL | Status: DC
  Administered 2016-04-05 – 2016-04-11 (×7): 1 mg via ORAL
  Filled 2016-04-05 (×7): qty 1

## 2016-04-05 MED ORDER — HEPARIN SODIUM (PORCINE) 5000 UNIT/ML IJ SOLN: 5000.0000 [IU] | Freq: Three times a day (TID) | INTRAMUSCULAR | Status: DC

## 2016-04-05 MED ORDER — LORAZEPAM 2 MG/ML IJ SOLN
0.0000 mg | Freq: Four times a day (QID) | INTRAMUSCULAR | Status: DC
  Administered 2016-04-05: 1 mg via INTRAVENOUS
  Administered 2016-04-05: 2 mg via INTRAVENOUS
  Administered 2016-04-06: 1 mg via INTRAVENOUS
  Filled 2016-04-05 (×2): qty 1

## 2016-04-05 MED ORDER — PIPERACILLIN-TAZOBACTAM 3.375 G IVPB
3.3750 g | Freq: Three times a day (TID) | INTRAVENOUS | Status: DC
  Administered 2016-04-05 – 2016-04-10 (×15): 3.375 g via INTRAVENOUS
  Filled 2016-04-05 (×18): qty 50

## 2016-04-05 MED ORDER — ADULT MULTIVITAMIN W/MINERALS CH
1.0000 | ORAL_TABLET | Freq: Every day | ORAL | Status: DC
  Administered 2016-04-05 – 2016-04-11 (×7): 1 via ORAL
  Filled 2016-04-05 (×7): qty 1

## 2016-04-05 MED ORDER — ONDANSETRON HCL 4 MG PO TABS
4.0000 mg | ORAL_TABLET | Freq: Four times a day (QID) | ORAL | Status: DC | PRN
  Administered 2016-04-08: 4 mg via ORAL
  Filled 2016-04-05: qty 1

## 2016-04-05 MED ORDER — LORAZEPAM 2 MG/ML IJ SOLN: 0.5000 mg | INTRAMUSCULAR | Status: DC | PRN

## 2016-04-05 MED ORDER — VITAMIN B-1 100 MG PO TABS
100.0000 mg | ORAL_TABLET | Freq: Every day | ORAL | Status: DC
  Administered 2016-04-05 – 2016-04-11 (×7): 100 mg via ORAL
  Filled 2016-04-05 (×7): qty 1

## 2016-04-05 MED ORDER — ALBUTEROL SULFATE (2.5 MG/3ML) 0.083% IN NEBU: 2.5000 mg | INHALATION_SOLUTION | RESPIRATORY_TRACT | Status: DC | PRN

## 2016-04-05 MED ORDER — LORAZEPAM 2 MG/ML IJ SOLN: 1.0000 mg | INTRAMUSCULAR | Status: DC | PRN

## 2016-04-05 MED ORDER — CLONIDINE HCL 0.1 MG PO TABS
0.1000 mg | ORAL_TABLET | Freq: Two times a day (BID) | ORAL | Status: DC
  Administered 2016-04-05 – 2016-04-06 (×3): 0.1 mg via ORAL
  Filled 2016-04-05 (×3): qty 1

## 2016-04-05 MED ORDER — LORAZEPAM 1 MG PO TABS
1.0000 mg | ORAL_TABLET | Freq: Four times a day (QID) | ORAL | Status: DC | PRN
  Administered 2016-04-05 – 2016-04-06 (×3): 1 mg via ORAL
  Filled 2016-04-05 (×3): qty 1

## 2016-04-05 MED ORDER — THIAMINE HCL 100 MG/ML IJ SOLN: 100.0000 mg | Freq: Every day | INTRAMUSCULAR | Status: DC

## 2016-04-05 MED ORDER — ONDANSETRON HCL 4 MG/2ML IJ SOLN
4.0000 mg | Freq: Four times a day (QID) | INTRAMUSCULAR | Status: DC | PRN
  Administered 2016-04-06: 4 mg via INTRAVENOUS
  Filled 2016-04-05: qty 2

## 2016-04-05 MED ORDER — ENOXAPARIN SODIUM 40 MG/0.4ML ~~LOC~~ SOLN
40.0000 mg | Freq: Every day | SUBCUTANEOUS | Status: DC
  Administered 2016-04-08 – 2016-04-11 (×4): 40 mg via SUBCUTANEOUS
  Filled 2016-04-05 (×5): qty 0.4

## 2016-04-05 MED ORDER — IPRATROPIUM BROMIDE 0.02 % IN SOLN: 0.5000 mg | RESPIRATORY_TRACT | Status: DC | PRN

## 2016-04-05 MED ORDER — DILTIAZEM HCL 25 MG/5ML IV SOLN
10.0000 mg | INTRAVENOUS | Status: DC | PRN
  Filled 2016-04-05: qty 5

## 2016-04-05 MED ORDER — BOOST / RESOURCE BREEZE PO LIQD
1.0000 | Freq: Three times a day (TID) | ORAL | Status: DC
  Administered 2016-04-05 – 2016-04-08 (×5): 1 via ORAL

## 2016-04-05 MED ORDER — FLUTICASONE PROPIONATE 50 MCG/ACT NA SUSP: 1.0000 | Freq: Every day | NASAL | Status: DC | PRN

## 2016-04-05 MED ORDER — LORAZEPAM 2 MG/ML IJ SOLN: 0.0000 mg | Freq: Two times a day (BID) | INTRAMUSCULAR | Status: DC

## 2016-04-05 MED ORDER — NICOTINE 21 MG/24HR TD PT24
21.0000 mg | MEDICATED_PATCH | Freq: Every day | TRANSDERMAL | Status: DC
  Administered 2016-04-05 – 2016-04-11 (×6): 21 mg via TRANSDERMAL
  Filled 2016-04-05 (×7): qty 1

## 2016-04-05 MED ORDER — IRBESARTAN 300 MG PO TABS
150.0000 mg | ORAL_TABLET | Freq: Every day | ORAL | Status: DC
  Administered 2016-04-05 – 2016-04-07 (×3): 150 mg via ORAL
  Filled 2016-04-05 (×3): qty 1

## 2016-04-05 MED ORDER — LORAZEPAM 2 MG/ML IJ SOLN
1.0000 mg | Freq: Four times a day (QID) | INTRAMUSCULAR | Status: DC | PRN
  Administered 2016-04-05: 1 mg via INTRAVENOUS
  Filled 2016-04-05: qty 1

## 2016-04-05 NOTE — Progress Notes (Signed)
Pharmacy Antibiotic Note  Alvin Benton is a 65 y.o. male admitted on 04/04/2016 with abdominal pain.  Pharmacy has been consulted for Zosyn dosing. WBC is elevated, renal function ok. CT with diverticulitis/colitis.   Plan: -Zosyn 3.375G IV q8h to be infused over 4 hours -Trend WBC, temp, renal function  -F/U infectious work-up  Temp (24hrs), Avg:97.7 F (36.5 C), Min:97.4 F (36.3 C), Max:97.9 F (36.6 C)   Recent Labs Lab 04/04/16 1809 04/04/16 2018 04/04/16 2310  WBC 15.0*  --   --   CREATININE 1.15  --   --   LATICACIDVEN  --  3.37* 1.08    CrCl cannot be calculated (Unknown ideal weight.).    No Known Allergies   Alvin DukeLedford, Laysa Kimmey 04/05/2016 12:39 AM

## 2016-04-05 NOTE — ED Notes (Signed)
Admitting MD J. McClung paged to discuss changing patients level of care.

## 2016-04-05 NOTE — ED Notes (Addendum)
MD at bedside. 

## 2016-04-05 NOTE — Progress Notes (Signed)
CSW consult acknowledged re: "Patient is complaints of hospital bills and finding ways to pay for multiple hospitalizations". Patient has [employer] private insurance and will need to contact his insurance company re: assistance for hospital coverage. Please contact if new need(s) arise.      Lance MussAshley Gardner,MSW, LCSW [facility] ED/18M Clinical Social Worker [phone]

## 2016-04-05 NOTE — ED Notes (Addendum)
Pt ambulated to the bathroom w/ standby assist.  He had an occurrence of diarrhea and needed to be cleaned up. Pt was brought back to the room, appeared to be panicky.  Last mg of ativan given.  Pt calmed down, breathing and O2 level's returned to 92% on 3L.  Dr. Katrinka BlazingSmith notified.

## 2016-04-05 NOTE — ED Notes (Signed)
Patient states IV came out while trying to get back in bed he rolled over on it.

## 2016-04-05 NOTE — ED Notes (Signed)
Notified Dr. Katrinka BlazingSmith that CIWA was a 12, requested Ativan - CIWA dosage.  MD ordered and requested that RN start Ativan now. Also notified MD of O2 levels.

## 2016-04-05 NOTE — ED Notes (Signed)
Water given  

## 2016-04-05 NOTE — Progress Notes (Signed)
Alvin Benton is a 65 y.o. male patient admitted from ED awake, alert - oriented  X 4 - no acute distress noted.  VSS - Blood pressure 178/102, pulse 90, temperature 98.9 F [name], temperature source Oral, resp. rate 19, height 5\' 9"  (1.753 m), weight 80.287 kg (177 lb), SpO2 98 %.    IV in place, occlusive dsg intact without redness.  MD paged about patient's BP and that he states he normally takes valsartan daily.   Orientation to room, and floor completed with information packet given to patient/family.  Patient declined safety video at this time.  Admission INP armband ID verified with patient/family, and in place.   SR up x 2, fall assessment complete, with patient and family able to verbalize understanding of risk associated with falls, and verbalized understanding to call nsg before up out of bed.  Call light within reach, patient able to voice, and demonstrate understanding.  Skin, clean-dry- intact without evidence of bruising, or skin tears.   No evidence of skin break down noted on exam.     Will cont to eval and treat per MD orders.  L'ESPERANCE, Alvin Casebeer C, RN 04/05/2016 1:14 PM

## 2016-04-05 NOTE — ED Notes (Signed)
Attempted to give report states patient will not be going to unit.

## 2016-04-05 NOTE — ED Notes (Signed)
Pt tells staff that he has to go to batroom to have BM.  Pt is very anxious and fidgety and has "shakes"  Pt helped to the bathroom where he had diarrhea soiling his pants (pt threw them out) and came out of the bathroom with only his hospital gown on and his belt over the gown, stool soiling his legs and socks.  Pt has increased "shakes" at this time and is visibly sweaty.  Pt helped into bed, changed bed and gown and cleaned pt up.  Pt was then given 1mg  IV ativan based on CIWA of 19.  Pt placed on 2L [place] for spo2 87%.  Pt appears to calm after the ativan, no further sweating, his HR slows and he is calm and cooperative.  Repeat CIWA after 1mg  IV ativan was 6.  Will continue to monitor

## 2016-04-05 NOTE — Consult Note (Signed)
[facility] Surgery Consult Note  Alvin Benton 1951-10-09  [id number].    Requesting MD: Lonia Blood, MD  Chief Complaint/Reason for Consult: diverticulitis  HPI:  65 y/o male with PMH of IBS, diverticulitis, and HTN who presented to [facility] with abdominal pain, nausea, and vomiting. 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 He was discharged home on Augmentin and followed-up with Dr. Janee Morn who arranged for him to see Dr. Maisie Fus later this month to discuss surgical correction of the colovesical fistula. Pt states that his pain never truly went away after discharge, however it got significantly worse 3 days ago when he woke up with sharp,non-radiating LLQ pain and nausea/vomiting. Hydrocodone helped the pain temporarily but then the pain "came back with a vengeance". He has been passing gas, urine, and loose stools per rectum. Denies hematemesis or hematochezia. Reports that he was drinking alcohol to deal with his pain but has decreased his drinking since he has been taking the hydrocodone. Pt has had decreased appetite for the past two weeks and is "afraid to eat normally". States he has not had a real meal since Tuesday, and he usually eats tomato soup.  ROS: Denies fever/chills, vision changes, weakness/dizziness/syncope, SOB, hematuria    Found to have a.fib in ED, now controlled after IVF, pt admitted by medicine.  Lactic acid - 3.37 Leukocytosis -15  Pt abuses alcohol and tobacco Not on any blood thinning medications NKDA ROS: All systems reviewed and otherwise negative except for as above  Family History  Problem Relation Age of Onset  . Stroke Mother   . Colon cancer Neg Hx   . Esophageal cancer Neg Hx   . Rectal cancer Neg Hx   . Stomach cancer Neg Hx     Past Medical History  Diagnosis Date  . Blood in stool   . IBS (irritable bowel syndrome)   . Diverticulitis   . Hypertension   . Migraine    "none in a long time" (03/14/2016)    Past Surgical History  Procedure Laterality Date  . Colonoscopy    . Tonsillectomy      Social History:  reports that he has been smoking Cigarettes.  He has a 40 pack-year smoking history. He has never used smokeless tobacco. He reports that he drinks about 4.8 oz of alcohol per week. He reports that he does not use illicit drugs.  Allergies: No Known Allergies   (Not in a hospital admission)  Blood pressure 165/82, pulse 85, temperature 98.2 F (36.8 C), temperature source Oral, resp. rate 21, SpO2 93 %.  Physical Exam: General: pleasant, overweight white male sitting up in bed, mild shakiness/tremors likely secondary to alcohol withdrawal HEENT: head is normocephalic, atraumatic.  Sclera are noninjected.  Mouth is pink and moist Heart: regular, rate, and rhythm.  No obvious murmurs, gallops, or rubs noted.  Palpable pedal pulses bilaterally Lungs: CTAB, no wheezes, rhonchi, or rales noted.  Respiratory effort nonlabored Abd: soft, TTP of LLW, ND, hyperactive BS; no peritoneal signs  MS: all 4 extremities are symmetrical with no cyanosis, clubbing, or edema Skin: warm and dry with no masses, lesions, or rashes Psych: A&Ox3 with an appropriate affect. Neuro: normal speech  Results for orders placed or performed during the hospital encounter of 04/04/16 (from the past 48 hour(s))  Lipase, blood     Status: None   Collection Time: 04/04/16  6:09 PM  Result Value Ref Range   Lipase 41 11 - 51  U/L  Comprehensive metabolic panel     Status: Abnormal   Collection Time: 04/04/16  6:09 PM  Result Value Ref Range   Sodium 140 135 - 145 mmol/L   Potassium 4.4 3.5 - 5.1 mmol/L   Chloride 103 101 - 111 mmol/L   CO2 19 (L) 22 - 32 mmol/L   Glucose, Bld 142 (H) 65 - 99 mg/dL   BUN <5 (L) 6 - 20 mg/dL   Creatinine, Ser 1.15 0.61 - 1.24 mg/dL   Calcium 9.2 8.9 - 10.3 mg/dL   Total Protein 7.6 6.5 - 8.1 g/dL   Albumin 3.2 (L) 3.5 - 5.0 g/dL   AST  63 (H) 15 - 41 U/L   ALT 36 17 - 63 U/L    Comment: RESULTS CONFIRMED BY MANUAL DILUTION   Alkaline Phosphatase 95 38 - 126 U/L   Total Bilirubin 1.5 (H) 0.3 - 1.2 mg/dL   GFR calc non Af Amer >60 >60 mL/min   GFR calc Af Amer >60 >60 mL/min    Comment: (NOTE) The eGFR has been calculated using the CKD EPI equation. This calculation has not been validated in all clinical situations. eGFR's persistently <60 mL/min signify possible Chronic Kidney Disease.    Anion gap 18 (H) 5 - 15  CBC     Status: Abnormal   Collection Time: 04/04/16  6:09 PM  Result Value Ref Range   WBC 15.0 (H) 4.0 - 10.5 K/uL   RBC 4.41 4.22 - 5.81 MIL/uL   Hemoglobin 14.2 13.0 - 17.0 g/dL   HCT 43.6 39.0 - 52.0 %   MCV 98.9 78.0 - 100.0 fL   MCH 32.2 26.0 - 34.0 pg   MCHC 32.6 30.0 - 36.0 g/dL   RDW 16.2 (H) 11.5 - 15.5 %   Platelets 323 150 - 400 K/uL  I-Stat CG4 Lactic Acid, ED     Status: Abnormal   Collection Time: 04/04/16  8:18 PM  Result Value Ref Range   Lactic Acid, Venous 3.37 (HH) 0.5 - 2.0 mmol/L   Comment NOTIFIED PHYSICIAN   Urinalysis, Routine w reflex microscopic     Status: Abnormal   Collection Time: 04/04/16  9:29 PM  Result Value Ref Range   Color, Urine AMBER (A) YELLOW    Comment: BIOCHEMICALS MAY BE AFFECTED BY COLOR   APPearance TURBID (A) CLEAR   Specific Gravity, Urine 1.024 1.005 - 1.030   pH 7.0 5.0 - 8.0   Glucose, UA NEGATIVE NEGATIVE mg/dL   Hgb urine dipstick MODERATE (A) NEGATIVE   Bilirubin Urine SMALL (A) NEGATIVE   Ketones, ur >80 (A) NEGATIVE mg/dL   Protein, ur 100 (A) NEGATIVE mg/dL   Nitrite NEGATIVE NEGATIVE   Leukocytes, UA LARGE (A) NEGATIVE  Urine microscopic-add on     Status: Abnormal   Collection Time: 04/04/16  9:29 PM  Result Value Ref Range   Squamous Epithelial / LPF 0-5 (A) NONE SEEN   WBC, UA TOO NUMEROUS TO COUNT 0 - 5 WBC/hpf   RBC / HPF 6-30 0 - 5 RBC/hpf   Bacteria, UA RARE (A) NONE SEEN   Casts HYALINE CASTS (A) NEGATIVE  I-Stat CG4  Lactic Acid, ED     Status: None   Collection Time: 04/04/16 11:10 PM  Result Value Ref Range   Lactic Acid, Venous 1.08 0.5 - 2.0 mmol/L  Protime-INR     Status: None   Collection Time: 04/05/16  1:35 AM  Result Value Ref Range   Prothrombin  Time 15.1 11.6 - 15.2 seconds   INR 1.17 0.00 - 1.49  APTT     Status: None   Collection Time: 04/05/16  1:35 AM  Result Value Ref Range   aPTT 31 24 - 37 seconds  I-Stat CG4 Lactic Acid, ED     Status: None   Collection Time: 04/05/16  1:43 AM  Result Value Ref Range   Lactic Acid, Venous 1.09 0.5 - 2.0 mmol/L  Urine rapid drug screen (hosp performed)     Status: Abnormal   Collection Time: 04/05/16  3:13 AM  Result Value Ref Range   Opiates POSITIVE (A) NONE DETECTED   Cocaine NONE DETECTED NONE DETECTED   Benzodiazepines NONE DETECTED NONE DETECTED   Amphetamines NONE DETECTED NONE DETECTED   Tetrahydrocannabinol NONE DETECTED NONE DETECTED   Barbiturates NONE DETECTED NONE DETECTED    Comment:        DRUG SCREEN FOR MEDICAL PURPOSES ONLY.  IF CONFIRMATION IS NEEDED FOR ANY PURPOSE, NOTIFY LAB WITHIN 5 DAYS.        LOWEST DETECTABLE LIMITS FOR URINE DRUG SCREEN Drug Class       Cutoff (ng/mL) Amphetamine      1000 Barbiturate      200 Benzodiazepine   989 Tricyclics       211 Opiates          300 Cocaine          300 THC              50   CBC     Status: Abnormal   Collection Time: 04/05/16  5:33 AM  Result Value Ref Range   WBC 13.0 (H) 4.0 - 10.5 K/uL   RBC 3.89 (L) 4.22 - 5.81 MIL/uL   Hemoglobin 12.1 (L) 13.0 - 17.0 g/dL   HCT 38.3 (L) 39.0 - 52.0 %   MCV 98.5 78.0 - 100.0 fL   MCH 31.1 26.0 - 34.0 pg   MCHC 31.6 30.0 - 36.0 g/dL   RDW 16.7 (H) 11.5 - 15.5 %   Platelets 246 150 - 400 K/uL  Comprehensive metabolic panel     Status: Abnormal   Collection Time: 04/05/16  5:33 AM  Result Value Ref Range   Sodium 134 (L) 135 - 145 mmol/L   Potassium 3.7 3.5 - 5.1 mmol/L   Chloride 105 101 - 111 mmol/L   CO2 20 (L)  22 - 32 mmol/L   Glucose, Bld 85 65 - 99 mg/dL   BUN 6 6 - 20 mg/dL   Creatinine, Ser 0.82 0.61 - 1.24 mg/dL   Calcium 8.1 (L) 8.9 - 10.3 mg/dL   Total Protein 5.8 (L) 6.5 - 8.1 g/dL   Albumin 2.5 (L) 3.5 - 5.0 g/dL   AST 39 15 - 41 U/L   ALT 24 17 - 63 U/L   Alkaline Phosphatase 73 38 - 126 U/L   Total Bilirubin 1.0 0.3 - 1.2 mg/dL   GFR calc non Af Amer >60 >60 mL/min   GFR calc Af Amer >60 >60 mL/min    Comment: (NOTE) The eGFR has been calculated using the CKD EPI equation. This calculation has not been validated in all clinical situations. eGFR's persistently <60 mL/min signify possible Chronic Kidney Disease.    Anion gap 9 5 - 15   Dg Chest 2 View  04/05/2016  CLINICAL DATA:  Cough and tachycardia EXAM: CHEST  2 VIEW COMPARISON:  April 04, 2016 FINDINGS: There is interstitial pulmonary edema  with borderline cardiomegaly and pulmonary venous hypertension. There is mild airspace opacity in the medial right base region. There is no demonstrable adenopathy. No bone lesions. IMPRESSION: Evidence of a degree of congestive heart failure. Probable alveolar edema in the medial right base, although a small focus of pneumonia cannot be excluded radiographically in this region. Electronically Signed   By: Lowella Grip III M.D.   On: 04/05/2016 07:30   Ct Abdomen Pelvis W Contrast  04/04/2016  CLINICAL DATA:  Diverticulitis with persistent nausea and vomiting EXAM: CT ABDOMEN AND PELVIS WITH CONTRAST TECHNIQUE: Multidetector CT imaging of the abdomen and pelvis was performed using the standard protocol following bolus administration of intravenous contrast. CONTRAST:  117m ISOVUE-300 IOPAMIDOL (ISOVUE-300) INJECTION 61% COMPARISON:  04/01/2016 FINDINGS: The lung bases are free of acute infiltrate or sizable effusion. The liver demonstrates fatty infiltration. The spleen, adrenal glands, pancreas and gallbladder are all normal in their CT appearance. Kidneys again demonstrate a normal enhancement  pattern. Stomach is well distended with contrast material. The visualized small bowel is within normal limits. The appendix is within normal limits. No inflammatory changes are seen. There remain diverticular change of the colon as well as significant length of sigmoid colon with circumferential wall thickening. No obstructive changes are seen. A tiny air-fluid collection is again identified adjacent to colon best seen on image number 78 of series 2. This measures 16 mm in greatest dimension and is again decreased from the previous exam. The bladder is partially distended and again demonstrates some air within. The possibility of a colovesical fistula would deserve consideration. Inflammatory changes are also noted extending towards adjacent small bowel loops similar to that seen on the prior exam. No free pelvic fluid is noted.  No acute bony abnormality is seen. IMPRESSION: Persistent changes of diverticulitis/ colitis in the sigmoid colon. Interval slight decrease in the air-fluid collection adjacent to colon is noted. Additionally some inflammatory change is again identified extending towards adjacent loops of small bowel similar to that noted on the prior exam. There is some thickening of the bladder wall with air within suspicious of colovesical fistula. Electronically Signed   By: MInez CatalinaM.D.   On: 04/04/2016 22:57   Dg Chest Port 1 View  04/04/2016  CLINICAL DATA:  Shortness of breath and hypoxia EXAM: PORTABLE CHEST 1 VIEW COMPARISON:  None. FINDINGS: Cardiac shadow is within normal limits. The lungs are clear bilaterally. No acute bony abnormality is seen. IMPRESSION: No acute abnormality noted. Electronically Signed   By: MInez CatalinaM.D.   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 CBrantley Stage- Admit on IV abx, bowel rest, IVF, analgesic, antiemetics - concern for possible malnutrition/need for TPN in  near future, prealbumin ordered. - likely colectomy/colostomy surgery this weekend/next week after a few days of IV abx and bowel rest.  ID: Zosyn Day #1 FEN: clear liquids  DVT: Lovenox, SCD's Dispo: Floor, ambulate as tolerated, IS   Atrial fibrillation: Acute CHA2DS2 VASc score is 1 afib has rapidly resolved w/ volume - follow on tele  No indication for long-term anticoagulation at this time - TSH ordered  Acute Hypoxic Respiratory failure  sats in med 80s on RA Urinary tract infection secondary to colovesical fistula Essential hypertension  Alcohol abuse w/ acute withdrawal - thiamine, folic acid, ativan Tobacco abuse  EJill Alexanders PChildren[facility] 04/05/2016, 9:51 AM Pager: [phone]-Fri 7:00 am-4:30 pm  Sat-Sun 7:00 am-11:30 am

## 2016-04-05 NOTE — ED Notes (Addendum)
RN text paged MD advised: Patient order for NPO expect ice chips should RN hold PO medication.

## 2016-04-05 NOTE — Progress Notes (Addendum)
[facility] TEAM 1 - Stepdown/ICU TEAM  [name] Sura  ZOX:[id number] DOB: 12/26/1950 DOA: 04/04/2016 PCP: Neena RhymesKatherine Tabori, MD    Brief Narrative:  65 y.o. male with history of IBS, diverticulitis, and HTN who presented with complaints of abdominal pain, nausea, and vomiting. He [facility] - 5/22 with sigmoid diverticulitis with abscesses and a colovesicular fistula. Since that discharge he had been taking Augmentin.  In the ED he had heart rates up to 153, respirations up to 30.  Lab work revealed WBC 15 and a lactic acid of 3.37. Chest x-ray was unremarkable. CT abdomen revealed continued diverticulitis with suspicion for colovesical fistula.   Subjective: The pt is resting comfortably in bed.  He states he feels better in general s/p volume resuscitation.  He denies cp, sob, vomiting.  He c/o moderate abdom pain, and some nausea.    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 Cont empiric abx coverage - Gen Surg consulted to see in f/u   Atrial fibrillation: Acute CHA2DS2 VASc score is 1 - afib has rapidly resolved w/ volume - follow on tele - unless recurs there will be no indication for long term anticoag - check TSH - TTE not necessary presently   Acute Hypoxic Respiratory failure  sats in med 80s on RA at presentation - ?splinting - now 98% or RA - no evidence of clear lung pathology - follow   Abnormal UA secondary to colovesical fistula Is on empiric abx   Essential hypertension BP trending up s/p volume resuscitation - resume home ARB and follow  Alcohol abuse w/ acute withdrawal  Appears to be reasonably controlled on low dose ativan CIWA - follow   Tobacco abuse Counseled on need to stop smoking entirely  DVT prophylaxis: lovenox Code Status: FULL CODE Family Communication: no family present at time of exam  Disposition Plan: tele bed - empiric abx - bowel rest - await surgical opinion    Consultants:  none  Procedures: none  Antimicrobials:  Zosyn 6/8 >   Objective: Blood pressure 158/78, pulse 97, temperature 98.2 F (36.8 C), temperature source Oral, resp. rate 23, SpO2 94 %.  Intake/Output Summary (Last 24 hours) at 04/05/16 [id number] Last data filed at 04/04/16 2121  Gross per 24 hour  Intake   2000 ml  Output      0 ml  Net   2000 ml   Examination: General: No acute respiratory distress Lungs: Clear to auscultation bilaterally without wheezes or crackles Cardiovascular: Regular rate and rhythm without murmur gallop or rub normal S1 and S2 Abdomen: Mildly tender to deep palpation in L abdom, nondistended, soft, bowel sounds positive, no rebound, no ascites, no appreciable mass Extremities: No significant cyanosis, clubbing, or edema bilateral lower extremities  CBC:  Recent Labs Lab 04/04/16 1809 04/05/16 0533  WBC 15.0* 13.0*  HGB 14.2 12.1*  HCT 43.6 38.3*  MCV 98.9 98.5  PLT 323 246   Basic Metabolic Panel:  Recent Labs Lab 04/04/16 1809 04/05/16 0533  NA 140 134*  K 4.4 3.7  CL 103 105  CO2 19* 20*  GLUCOSE 142* 85  BUN <5* 6  CREATININE 1.15 0.82  CALCIUM 9.2 8.1*   GFR: CrCl cannot be calculated (Unknown ideal weight.).  Liver Function Tests:  Recent Labs Lab 04/04/16 1809 04/05/16 0533  AST 63* 39  ALT 36 24  ALKPHOS 95 73  BILITOT 1.5* 1.0  PROT 7.6 5.8*  ALBUMIN 3.2*  2.5*    Recent Labs Lab 04/04/16 1809  LIPASE 41    Coagulation Profile:  Recent Labs Lab 04/05/16 0135  INR 1.17    Scheduled Meds: . folic acid  1 mg Oral Daily  . LORazepam  0-4 mg Intravenous Q6H   Followed by  . [START ON 04/07/2016] LORazepam  0-4 mg Intravenous Q12H  . multivitamin with minerals  1 tablet Oral Daily  . nicotine  21 mg Transdermal Daily  . thiamine  100 mg Oral Daily   Or  . thiamine  100 mg Intravenous Daily   Continuous Infusions: . sodium chloride Stopped (04/05/16 0258)  . piperacillin-tazobactam  (ZOSYN)  IV Stopped (04/05/16 0745)     LOS: 0 days    Lonia Blood, MD [facility] Office  [phone] Pager - Text Page per Amion as per below:  On-Call/Text Page:      [email or web address]      password [email or web address]  If 7PM-7AM, please contact night-coverage [email or web address] Password [email or web address] 04/05/2016, 8:22 AM

## 2016-04-05 NOTE — ED Notes (Signed)
Placed pt on 15 L non-rebreather due to O2 level remaining in the mid-80's.  O2 level returned to 96-99%.

## 2016-04-05 NOTE — Progress Notes (Signed)
Report received from Courtney,RN for admission to [id number]

## 2016-04-06 DIAGNOSIS — F10232 Alcohol dependence with withdrawal with perceptual disturbance: Secondary | ICD-10-CM

## 2016-04-06 LAB — COMPREHENSIVE METABOLIC PANEL
ALBUMIN: 2.3 g/dL — AB (ref 3.5–5.0)
ALK PHOS: 66 U/L (ref 38–126)
ALT: 22 U/L (ref 17–63)
AST: 34 U/L (ref 15–41)
Anion gap: 7 (ref 5–15)
BILIRUBIN TOTAL: 0.9 mg/dL (ref 0.3–1.2)
CO2: 23 mmol/L (ref 22–32)
Calcium: 8.2 mg/dL — ABNORMAL LOW (ref 8.9–10.3)
Chloride: 103 mmol/L (ref 101–111)
Creatinine, Ser: 0.73 mg/dL (ref 0.61–1.24)
GFR calc Af Amer: >60 mL/min (ref >60)
GFR calc non Af Amer: >60 mL/min (ref >60)
GLUCOSE: 104 mg/dL — AB (ref 65–99)
POTASSIUM: 3.7 mmol/L (ref 3.5–5.1)
SODIUM: 133 mmol/L — AB (ref 135–145)
TOTAL PROTEIN: 5.7 g/dL — AB (ref 6.5–8.1)

## 2016-04-06 LAB — CBC
HEMATOCRIT: 35.2 % — AB (ref 39.0–52.0)
HEMOGLOBIN: 11.4 g/dL — AB (ref 13.0–17.0)
MCH: 31.6 pg (ref 26.0–34.0)
MCHC: 32.4 g/dL (ref 30.0–36.0)
MCV: 97.5 fL (ref 78.0–100.0)
Platelets: 215 10*3/uL (ref 150–400)
RBC: 3.61 MIL/uL — ABNORMAL LOW (ref 4.22–5.81)
RDW: 15.9 % — AB (ref 11.5–15.5)
WBC: 9.1 10*3/uL (ref 4.0–10.5)

## 2016-04-06 LAB — URINE CULTURE: Culture: <10000 — AB

## 2016-04-06 LAB — C DIFFICILE QUICK SCREEN W PCR REFLEX
C DIFFICILE (CDIFF) INTERP: NEGATIVE
C Diff antigen: NEGATIVE
C Diff toxin: NEGATIVE

## 2016-04-06 MED ORDER — ACETAMINOPHEN 325 MG PO TABS
650.0000 mg | ORAL_TABLET | ORAL | Status: DC | PRN
  Administered 2016-04-06 – 2016-04-09 (×2): 650 mg via ORAL
  Filled 2016-04-06 (×2): qty 2

## 2016-04-06 MED ORDER — LOPERAMIDE HCL 2 MG PO CAPS
2.0000 mg | ORAL_CAPSULE | Freq: Once | ORAL | Status: AC
  Administered 2016-04-06: 2 mg via ORAL
  Filled 2016-04-06: qty 1

## 2016-04-06 MED ORDER — CLONIDINE HCL 0.1 MG PO TABS
0.1000 mg | ORAL_TABLET | Freq: Three times a day (TID) | ORAL | Status: DC
  Administered 2016-04-06 – 2016-04-07 (×3): 0.1 mg via ORAL
  Filled 2016-04-06 (×3): qty 1

## 2016-04-06 MED ORDER — LORAZEPAM 2 MG/ML IJ SOLN
0.0000 mg | INTRAMUSCULAR | Status: DC | PRN
  Administered 2016-04-06: 1 mg via INTRAVENOUS
  Administered 2016-04-06: 2 mg via INTRAVENOUS
  Administered 2016-04-07 (×2): 1 mg via INTRAVENOUS
  Filled 2016-04-06 (×4): qty 1

## 2016-04-06 NOTE — Progress Notes (Signed)
Initial Nutrition Assessment  DOCUMENTATION CODES:  Not applicable  INTERVENTION:  Boost Breeze po TID, each supplement provides 250 kcal and 9 grams of protein  Meal preferences  NUTRITION DIAGNOSIS:  Inadequate oral intake related to altered GI function (colovesical fistula) as evidenced by a reported energy intake that met < or equal to 75% of estimated needs for > or equal to 3 months, as well as currently being restricted to CL   GOAL:  Patient will meet greater than or equal to 90% of their needs  MONITOR:  Labs, Diet advancement, I & O's, Supplement acceptance  REASON FOR ASSESSMENT:  Malnutrition Screening Tool    ASSESSMENT:  65 y/o male PMHx IBS, HTN, diverticulitis, chronic constipation who presented with abdominal pain, nausea, vomiting. He reports being unable to keep foods/liquids down w/ vomiting 10-15x. Worked up for colovesical fistula and related sepsis.   Pt reports that he "has not been able to eat normally for 3 months" . He has had severe n/v. He recently started having diarrhea when usually he is chronically constipated. He has more or less been on a full liquid diet. He says he ate a lot of soup (tomato) and sometimes steamed vegetables as his main source of intake. He says he mainly drinks metamucil and if that doesn't help his constipation he will take MOM. He also drank ~1-2 boost/ensure type supplements daily. At baseline, he didn't follow any type of diet. He did take a general mvi for men > 50.   Pt says his weight 3 months ago was 196, there is no documentation to confirm this. EMR shows he is heavier than he was 2 months ago. He disputes his current weight measurement and believes he is actually <170 lbs.   He reports currently having early satiety as he says he has been getting full from minimal amounts of clear liquids. RD took meal preferences. Continue Boost breeze TID as this will be his main source of protein while on the CL diet. He did not sound  to be interested in trying the prostat.   Per current MD notes, current plans are for Colostomy after a few days abx tx/bowel rest  NFPE: Appears WDL. He does not think he has lost any muscle. Feels he looks about the same, but also said his clothes are looser.  Labs reviewed: Albumin: 2.3,  Prealbumin: WDL.    Recent Labs Lab 04/04/16 1809 04/05/16 0533 04/06/16 0543  NA 140 134* 133*  K 4.4 3.7 3.7  CL 103 105 103  CO2 19* 20* 23  BUN <5* 6 <5*  CREATININE 1.15 0.82 0.73  CALCIUM 9.2 8.1* 8.2*  GLUCOSE 142* 85 104*   Diet Order:  Diet clear liquid Room service appropriate?: Yes; Fluid consistency:: Thin  Skin:  Reviewed, no issues  Last BM:  6/10  Height:  Ht Readings from Last 1 Encounters:  04/05/16 '5\' 9"'$  (1.753 m)   Weight:  Wt Readings from Last 1 Encounters:  04/05/16 177 lb (80.287 kg)   Wt Readings from Last 10 Encounters:  04/05/16 177 lb (80.287 kg)  03/14/16 169 lb (76.658 kg)  03/08/16 169 lb 2 oz (76.715 kg)  02/15/16 173 lb (78.472 kg)  07/17/15 195 lb (88.451 kg)  03/23/14 197 lb 6 oz (89.529 kg)  02/09/14 199 lb (90.266 kg)  01/12/14 197 lb (89.359 kg)  11/15/13 190 lb (86.183 kg)  09/28/13 190 lb (86.183 kg)   Ideal Body Weight:  72.73 kg  BMI:  Body  mass index is 26.13 kg/(m^2).  Estimated Nutritional Needs:  Kcal:  2000-2200 (25-27 kcal/kg bw) Protein:  95-109 g pro (1.3-1.5 g/kg ibw) Fluid:  >2 liters  EDUCATION NEEDS:  No education needs identified at this time  Burtis Junes RD, LDN, [facility] Pager: [phone] 04/06/2016 2:35 PM

## 2016-04-06 NOTE — Progress Notes (Signed)
[place] TEAM 1 - Stepdown/ICU TEAM  Alvin Benton  ZOX:[id number] DOB: 16-Jul-1951 DOA: 04/04/2016 PCP: Alvin Rhymes, MD    Brief Narrative:  66 y.o. male with history of IBS, diverticulitis, and HTN who presented with complaints of abdominal pain, nausea, and vomiting. He was last hospitalized at [facility] from 5/18 - 5/22 with sigmoid diverticulitis with abscesses and a colovesicular fistula. Since that discharge he had been taking Augmentin.  In the ED he had heart rates up to 153, respirations up to 30.  Lab work revealed WBC 15 and a lactic acid of 3.37. Chest x-ray was unremarkable. CT abdomen revealed continued diverticulitis with suspicion for colovesical fistula.   Subjective: The pt is tremulous this morning and "anxious."  His EtOH withdrawal appears to be somewhat worse.  He has developed diarrhea, which is not surprising given his active diverticulitis.  He c/o ongoing nausea and abdom discomfort, but no vomiting.  No cp or sob.  He is alert but mildly confused.    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 Cont empiric abx coverage - Gen Surg following   Alcohol abuse w/ acute withdrawal  Increase frequency of CIWA as sx not presently fully controlled    Atrial fibrillation: Acute CHA2DS2 VASc score is 1 - afib rapidly resolved w/ volume - follow on tele - unless recurs there will be no indication for long term anticoag - check TSH - TTE not necessary presently - remains in NSR this morning   Acute Hypoxic Respiratory failure - resolved sats in med 80s on RA at presentation - ?splinting - now 98% or RA - no evidence of clear lung pathology - follow   Abnormal UA secondary to colovesical fistula Is on empiric abx   Essential hypertension BP erratic - cont temporary clonidine in setting of withdrawal - follow   Tobacco abuse Counseled on need to stop smoking entirely  DVT prophylaxis: lovenox Code Status: FULL CODE Family  Communication: no family present at time of exam  Disposition Plan: tele bed - empiric abx - bowel rest    Consultants:  Gen Surgery   Procedures: none  Antimicrobials:  Zosyn 6/8 >   Objective: Blood pressure 162/80, pulse 81, temperature 99 F (37.2 C), temperature source Oral, resp. rate 17, height  (1.753 m), weight 80.287 kg (177 lb), SpO2 95 %.  Intake/Output Summary (Last 24 hours) at 04/06/16 0902 Last data filed at 04/05/16 2315  Gross per 24 hour  Intake 1266.67 ml  Output    300 ml  Net 966.67 ml   Examination: General: No acute respiratory distress - alert but mildly confused Lungs: Clear to auscultation bilaterally without wheeze Cardiovascular: Regular rate and rhythm without murmur gallop or rub  Abdomen: Mildly tender to deep palpation in L upper abdom, nondistended, soft, bowel sounds positive, no rebound Extremities: No significant cyanosis, clubbing, or edema bilateral lower extremities  CBC:  Recent Labs Lab 04/04/16 1809 04/05/16 0533 04/06/16 0543  WBC 15.0* 13.0* 9.1  HGB 14.2 12.1* 11.4*  HCT 43.6 38.3* 35.2*  MCV 98.9 98.5 97.5  PLT 323 246 215   Basic Metabolic Panel:  Recent Labs Lab 04/04/16 1809 04/05/16 0533 04/06/16 0543  NA 140 134* 133*  K 4.4 3.7 3.7  CL 103 105 103  CO2 19* 20* 23  GLUCOSE 142* 85 104*  BUN <5* 6 <5*  CREATININE 1.15 0.82 0.73  CALCIUM 9.2 8.1* 8.2*   GFR: Estimated Creatinine Clearance: 94.5  mL/min (by C-G formula based on Cr of 0.73).  Liver Function Tests:  Recent Labs Lab 04/04/16 1809 04/05/16 0533 04/06/16 0543  AST 63* 39 34  ALT 36 24 22  ALKPHOS 95 73 66  BILITOT 1.5* 1.0 0.9  PROT 7.6 5.8* 5.7*  ALBUMIN 3.2* 2.5* 2.3*    Recent Labs Lab 04/04/16 1809  LIPASE 41    Coagulation Profile:  Recent Labs Lab 04/05/16 0135  INR 1.17    Scheduled Meds: . cloNIDine  0.1 mg Oral BID  . enoxaparin (LOVENOX) injection  40 mg Subcutaneous Daily  . feeding supplement  1  Container Oral TID BM  . folic acid  1 mg Oral Daily  . irbesartan  150 mg Oral Daily  . LORazepam  0-4 mg Intravenous Q6H   Followed by  . [START ON 04/07/2016] LORazepam  0-4 mg Intravenous Q12H  . multivitamin with minerals  1 tablet Oral Daily  . nicotine  21 mg Transdermal Daily  . piperacillin-tazobactam (ZOSYN)  IV  3.375 g Intravenous Q8H  . thiamine  100 mg Oral Daily   Continuous Infusions: . sodium chloride 50 mL/hr at 04/06/16 0412     LOS: 1 day    [name], MD [facility] Office  [phone] Pager - Text Page per Loretha StaplerAmion as per below:  On-Call/Text Page:      [email or web address]      password [email or web address]  If 7PM-7AM, please contact night-coverage [email or web address] Password [email or web address] 04/06/2016, 9:02 AM

## 2016-04-06 NOTE — Progress Notes (Signed)
Subjective: Stool and air out penis, no real pain  Objective: Vital signs in last 24 hours: Temp:  [98.6 F (37 C)-99 F (37.2 C)] 99 F (37.2 C) (06/10 0545) Pulse Rate:  [75-94] 81 (06/10 0545) Resp:  [16-30] 17 (06/10 0545) BP: (132-178)/(72-102) 162/80 mmHg (06/10 0545) SpO2:  [89 %-98 %] 95 % (06/10 0545) Weight:  [80.287 kg (177 lb)] 80.287 kg (177 lb) (06/09 1255) Last BM Date: 04/05/16  Intake/Output from previous day: 06/09 0701 - 06/10 0700 In: 1266.7 [P.O.:820; I.V.:396.7; IV Piggyback:50] Out: 300 [Urine:300] Intake/Output this shift: Total I/O In: 480 [P.O.:480] Out: -   GI: soft nt/nd  Lab Results:   Recent Labs  04/05/16 0533 04/06/16 0543  WBC 13.0* 9.1  HGB 12.1* 11.4*  HCT 38.3* 35.2*  PLT 246 215   BMET  Recent Labs  04/05/16 0533 04/06/16 0543  NA 134* 133*  K 3.7 3.7  CL 105 103  CO2 20* 23  GLUCOSE 85 104*  BUN 6 <5*  CREATININE 0.82 0.73  CALCIUM 8.1* 8.2*   PT/INR  Recent Labs  04/05/16 0135  LABPROT 15.1  INR 1.17   ABG No results for input(s): PHART, HCO3 in the last 72 hours.  Invalid input(s): PCO2, PO2  Studies/Results: Dg Chest 2 View  04/05/2016  CLINICAL DATA:  Cough and tachycardia EXAM: CHEST  2 VIEW COMPARISON:  April 04, 2016 FINDINGS: There is interstitial pulmonary edema with borderline cardiomegaly and pulmonary venous hypertension. There is mild airspace opacity in the medial right base region. There is no demonstrable adenopathy. No bone lesions. IMPRESSION: Evidence of a degree of congestive heart failure. Probable alveolar edema in the medial right base, although a small focus of pneumonia cannot be excluded radiographically in this region. Electronically Signed   By: Bretta BangWilliam  Woodruff III M.D.   On: 04/05/2016 07:30   Ct Abdomen Pelvis W Contrast  04/04/2016  CLINICAL DATA:  Diverticulitis with persistent nausea and vomiting EXAM: CT ABDOMEN AND PELVIS WITH CONTRAST TECHNIQUE: Multidetector CT imaging  of the abdomen and pelvis was performed using the standard protocol following bolus administration of intravenous contrast. CONTRAST:  100mL ISOVUE-300 IOPAMIDOL (ISOVUE-300) INJECTION 61% COMPARISON:  04/01/2016 FINDINGS: The lung bases are free of acute infiltrate or sizable effusion. The liver demonstrates fatty infiltration. The spleen, adrenal glands, pancreas and gallbladder are all normal in their CT appearance. Kidneys again demonstrate a normal enhancement pattern. Stomach is well distended with contrast material. The visualized small bowel is within normal limits. The appendix is within normal limits. No inflammatory changes are seen. There remain diverticular change of the colon as well as significant length of sigmoid colon with circumferential wall thickening. No obstructive changes are seen. A tiny air-fluid collection is again identified adjacent to colon best seen on image number 78 of series 2. This measures 16 mm in greatest dimension and is again decreased from the previous exam. The bladder is partially distended and again demonstrates some air within. The possibility of a colovesical fistula would deserve consideration. Inflammatory changes are also noted extending towards adjacent small bowel loops similar to that seen on the prior exam. No free pelvic fluid is noted.  No acute bony abnormality is seen. IMPRESSION: Persistent changes of diverticulitis/ colitis in the sigmoid colon. Interval slight decrease in the air-fluid collection adjacent to colon is noted. Additionally some inflammatory change is again identified extending towards adjacent loops of small bowel similar to that noted on the prior exam. There is some thickening of the  bladder wall with air within suspicious of colovesical fistula. Electronically Signed   By: Alcide Clever M.D.   On: 04/04/2016 22:57   Dg Chest Port 1 View  04/04/2016  CLINICAL DATA:  Shortness of breath and hypoxia EXAM: PORTABLE CHEST 1 VIEW COMPARISON:   None. FINDINGS: Cardiac shadow is within normal limits. The lungs are clear bilaterally. No acute bony abnormality is seen. IMPRESSION: No acute abnormality noted. Electronically Signed   By: Alcide Clever M.D.   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 -consider discussing foley with urology as this might help with drainage ID: Zosyn Day #2 FEN: clear liquids  DVT: Lovenox, SCD's   [facility] 04/06/2016

## 2016-04-07 DIAGNOSIS — F1010 Alcohol abuse, uncomplicated: Secondary | ICD-10-CM

## 2016-04-07 DIAGNOSIS — K5732 Diverticulitis of large intestine without perforation or abscess without bleeding: Secondary | ICD-10-CM

## 2016-04-07 DIAGNOSIS — N321 Vesicointestinal fistula: Secondary | ICD-10-CM

## 2016-04-07 LAB — CBC
HCT: 35 % — ABNORMAL LOW (ref 39.0–52.0)
HEMOGLOBIN: 11.3 g/dL — AB (ref 13.0–17.0)
MCH: 31.9 pg (ref 26.0–34.0)
MCHC: 32.3 g/dL (ref 30.0–36.0)
MCV: 98.9 fL (ref 78.0–100.0)
Platelets: 219 10*3/uL (ref 150–400)
RBC: 3.54 MIL/uL — AB (ref 4.22–5.81)
RDW: 15.7 % — ABNORMAL HIGH (ref 11.5–15.5)
WBC: 6.9 10*3/uL (ref 4.0–10.5)

## 2016-04-07 LAB — COMPREHENSIVE METABOLIC PANEL
ALBUMIN: 2.2 g/dL — AB (ref 3.5–5.0)
ALK PHOS: 66 U/L (ref 38–126)
ALT: 24 U/L (ref 17–63)
ANION GAP: 8 (ref 5–15)
AST: 38 U/L (ref 15–41)
BUN: <5 mg/dL — ABNORMAL LOW (ref 6–20)
CALCIUM: 8.4 mg/dL — AB (ref 8.9–10.3)
CHLORIDE: 105 mmol/L (ref 101–111)
CO2: 23 mmol/L (ref 22–32)
CREATININE: 0.76 mg/dL (ref 0.61–1.24)
GFR calc Af Amer: >60 mL/min (ref >60)
GFR calc non Af Amer: >60 mL/min (ref >60)
GLUCOSE: 94 mg/dL (ref 65–99)
Potassium: 3.3 mmol/L — ABNORMAL LOW (ref 3.5–5.1)
SODIUM: 136 mmol/L (ref 135–145)
Total Bilirubin: 0.8 mg/dL (ref 0.3–1.2)
Total Protein: 5.6 g/dL — ABNORMAL LOW (ref 6.5–8.1)

## 2016-04-07 LAB — PHOSPHORUS: Phosphorus: 2.7 mg/dL (ref 2.5–4.6)

## 2016-04-07 LAB — TSH: TSH: 3.525 u[IU]/mL (ref 0.350–4.500)

## 2016-04-07 LAB — MAGNESIUM: MAGNESIUM: 1.7 mg/dL (ref 1.7–2.4)

## 2016-04-07 MED ORDER — LORAZEPAM 1 MG PO TABS
1.0000 mg | ORAL_TABLET | Freq: Four times a day (QID) | ORAL | Status: DC | PRN
  Administered 2016-04-07 – 2016-04-09 (×6): 1 mg via ORAL
  Filled 2016-04-07 (×6): qty 1

## 2016-04-07 MED ORDER — HYDRALAZINE HCL 20 MG/ML IJ SOLN
10.0000 mg | INTRAMUSCULAR | Status: DC | PRN
  Administered 2016-04-07 – 2016-04-09 (×3): 10 mg via INTRAVENOUS
  Filled 2016-04-07 (×3): qty 1

## 2016-04-07 MED ORDER — HYDRALAZINE HCL 20 MG/ML IJ SOLN
5.0000 mg | Freq: Once | INTRAMUSCULAR | Status: AC
  Administered 2016-04-07: 5 mg via INTRAVENOUS
  Filled 2016-04-07: qty 1

## 2016-04-07 MED ORDER — LORAZEPAM 2 MG/ML IJ SOLN: 1.0000 mg | Freq: Four times a day (QID) | INTRAMUSCULAR | Status: DC | PRN

## 2016-04-07 MED ORDER — MORPHINE SULFATE (PF) 2 MG/ML IV SOLN
1.0000 mg | INTRAVENOUS | Status: DC | PRN
  Administered 2016-04-07 – 2016-04-10 (×14): 1 mg via INTRAVENOUS
  Filled 2016-04-07 (×14): qty 1

## 2016-04-07 MED ORDER — KCL IN DEXTROSE-NACL 20-5-0.45 MEQ/L-%-% IV SOLN
INTRAVENOUS | Status: DC
  Administered 2016-04-07: 1000 mL via INTRAVENOUS
  Administered 2016-04-08 – 2016-04-09 (×4): via INTRAVENOUS
  Administered 2016-04-10: 75 mL/h via INTRAVENOUS
  Filled 2016-04-07 (×7): qty 1000

## 2016-04-07 NOTE — Progress Notes (Signed)
Patient ID: Alvin Benton, male   DOB: [birth year], 65 y.o.   MRN: [id number]     [place]      [address] [place], [place] [address]    Phone: [phone] FAX: [phone]     Subjective: No n/v. No pain today.  Urine with feculent output.  WBC normal.  Afebrile.   Objective:  Vital signs:  Filed Vitals:   04/06/16 1432 04/06/16 1600 04/06/16 2133 04/07/16 0534  BP: 186/85 161/80 174/83 185/90  Pulse: 73 79 70 114  Temp: 98.3 F (36.8 C)  97.7 F (36.5 C) 97.9 F (36.6 C)  TempSrc: Oral  Tympanic   Resp: '18  18 17  '$ Height:      Weight:      SpO2: 98%  93% 86%    Last BM Date: 04/06/16  Intake/Output   Yesterday:  06/10 0701 - 06/11 0700 In: 1523.7 [P.O.:1110; I.V.:413.7] Out: 1730 [Urine:1730] This shift:  Total I/O In: -  Out: 100 [Urine:100] n/a  Physical Exam: General: Pt awake/alert/oriented x4 in no acute distress Abdomen: Soft.  Nondistended.  Non tender.  No evidence of peritonitis.  No incarcerated hernias.   Problem List:   Principal Problem:   Diverticulitis Active Problems:   HTN (hypertension)   Alcohol abuse   Colovesical fistula   Hypoxia   Atrial fibrillation (HCC)    Results:   Labs: Results for orders placed or performed during the hospital encounter of 04/04/16 (from the past 48 hour(s))  Prealbumin     Status: None   Collection Time: 04/05/16 11:29 AM  Result Value Ref Range   Prealbumin 22.1 18 - 38 mg/dL  CBC     Status: Abnormal   Collection Time: 04/06/16  5:43 AM  Result Value Ref Range   WBC 9.1 4.0 - 10.5 K/uL   RBC 3.61 (L) 4.22 - 5.81 MIL/uL   Hemoglobin 11.4 (L) 13.0 - 17.0 g/dL   HCT 35.2 (L) 39.0 - 52.0 %   MCV 97.5 78.0 - 100.0 fL   MCH 31.6 26.0 - 34.0 pg   MCHC 32.4 30.0 - 36.0 g/dL   RDW 15.9 (H) 11.5 - 15.5 %   Platelets 215 150 - 400 K/uL  Comprehensive metabolic panel     Status: Abnormal   Collection Time: 04/06/16  5:43 AM  Result Value Ref  Range   Sodium 133 (L) 135 - 145 mmol/L   Potassium 3.7 3.5 - 5.1 mmol/L   Chloride 103 101 - 111 mmol/L   CO2 23 22 - 32 mmol/L   Glucose, Bld 104 (H) 65 - 99 mg/dL   BUN <5 (L) 6 - 20 mg/dL   Creatinine, Ser 0.73 0.61 - 1.24 mg/dL   Calcium 8.2 (L) 8.9 - 10.3 mg/dL   Total Protein 5.7 (L) 6.5 - 8.1 g/dL   Albumin 2.3 (L) 3.5 - 5.0 g/dL   AST 34 15 - 41 U/L   ALT 22 17 - 63 U/L   Alkaline Phosphatase 66 38 - 126 U/L   Total Bilirubin 0.9 0.3 - 1.2 mg/dL   GFR calc non Af Amer >60 >60 mL/min   GFR calc Af Amer >60 >60 mL/min    Comment: (NOTE) The eGFR has been calculated using the CKD EPI equation. This calculation has not been validated in all clinical situations. eGFR's persistently <60 mL/min signify possible Chronic Kidney Disease.    Anion gap 7 5 - 15  C difficile  quick scan w PCR reflex     Status: None   Collection Time: 04/06/16  9:53 AM  Result Value Ref Range   C Diff antigen NEGATIVE NEGATIVE   C Diff toxin NEGATIVE NEGATIVE   C Diff interpretation Negative for toxigenic C. difficile   TSH     Status: None   Collection Time: 04/07/16  5:42 AM  Result Value Ref Range   TSH 3.525 0.350 - 4.500 uIU/mL  Comprehensive metabolic panel     Status: Abnormal   Collection Time: 04/07/16  5:42 AM  Result Value Ref Range   Sodium 136 135 - 145 mmol/L   Potassium 3.3 (L) 3.5 - 5.1 mmol/L   Chloride 105 101 - 111 mmol/L   CO2 23 22 - 32 mmol/L   Glucose, Bld 94 65 - 99 mg/dL   BUN <5 (L) 6 - 20 mg/dL   Creatinine, Ser 0.76 0.61 - 1.24 mg/dL   Calcium 8.4 (L) 8.9 - 10.3 mg/dL   Total Protein 5.6 (L) 6.5 - 8.1 g/dL   Albumin 2.2 (L) 3.5 - 5.0 g/dL   AST 38 15 - 41 U/L   ALT 24 17 - 63 U/L   Alkaline Phosphatase 66 38 - 126 U/L   Total Bilirubin 0.8 0.3 - 1.2 mg/dL   GFR calc non Af Amer >60 >60 mL/min   GFR calc Af Amer >60 >60 mL/min    Comment: (NOTE) The eGFR has been calculated using the CKD EPI equation. This calculation has not been validated in all clinical  situations. eGFR's persistently <60 mL/min signify possible Chronic Kidney Disease.    Anion gap 8 5 - 15  CBC     Status: Abnormal   Collection Time: 04/07/16  5:42 AM  Result Value Ref Range   WBC 6.9 4.0 - 10.5 K/uL   RBC 3.54 (L) 4.22 - 5.81 MIL/uL   Hemoglobin 11.3 (L) 13.0 - 17.0 g/dL   HCT 35.0 (L) 39.0 - 52.0 %   MCV 98.9 78.0 - 100.0 fL   MCH 31.9 26.0 - 34.0 pg   MCHC 32.3 30.0 - 36.0 g/dL   RDW 15.7 (H) 11.5 - 15.5 %   Platelets 219 150 - 400 K/uL  Magnesium     Status: None   Collection Time: 04/07/16  5:42 AM  Result Value Ref Range   Magnesium 1.7 1.7 - 2.4 mg/dL  Phosphorus     Status: None   Collection Time: 04/07/16  5:42 AM  Result Value Ref Range   Phosphorus 2.7 2.5 - 4.6 mg/dL    Imaging / Studies: No results found.  Medications / Allergies:  Scheduled Meds: . cloNIDine  0.1 mg Oral TID  . enoxaparin (LOVENOX) injection  40 mg Subcutaneous Daily  . feeding supplement  1 Container Oral TID BM  . folic acid  1 mg Oral Daily  . irbesartan  150 mg Oral Daily  . multivitamin with minerals  1 tablet Oral Daily  . nicotine  21 mg Transdermal Daily  . piperacillin-tazobactam (ZOSYN)  IV  3.375 g Intravenous Q8H  . thiamine  100 mg Oral Daily   Continuous Infusions: . sodium chloride 1,000 mL (04/06/16 1841)   PRN Meds:.acetaminophen, albuterol, fluticasone, HYDROmorphone (DILAUDID) injection, LORazepam **FOLLOWED BY** [DISCONTINUED] LORazepam, ondansetron **OR** ondansetron (ZOFRAN) IV  Antibiotics: Anti-infectives    Start     Dose/Rate Route Frequency Ordered Stop   04/05/16 0400  piperacillin-tazobactam (ZOSYN) IVPB 3.375 g     3.375 g  12.5 mL/hr over 240 Minutes Intravenous Every 8 hours 04/05/16 0041     04/04/16 2000  piperacillin-tazobactam (ZOSYN) IVPB 3.375 g     3.375 g 100 mL/hr over 30 Minutes Intravenous  Once 04/04/16 1951 04/04/16 2037         Assessment/Plan: 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 - likely colectomy/colostomy surgery after a few days of IV ID: Zosyn Day #3 FEN: full liquids  DVT: Lovenox, SCD's Dispo-not surgically stable.  OP follow up with Dr. Leighton Ruff   Erby Pian, ANP-BC [facility]   04/07/2016 9:13 AM

## 2016-04-07 NOTE — Progress Notes (Signed)
PROGRESS NOTE    Alvin Benton  ZOX:[id number] DOB: 05-Jan-1951 DOA: 04/04/2016 PCP: Neena Rhymes, MD (Confirm with patient/family/NH records and if not entered, this HAS to be entered at [facility] point of entry. "No PCP" if truly none.)   Brief Narrative: (Start on day 1 of progress note - keep it brief and live) Diverticulitis with colovesical fistula, 65 yo male with IBS, etho abuse, atrial fibrillation. Started on supportive care and IV antibiotics, follow on surgery recommendations.   Assessment & Plan:   Principal Problem:   Diverticulitis Active Problems:   HTN (hypertension)   Alcohol abuse   Colovesical fistula   Hypoxia   Atrial fibrillation (HCC)   1. Cardiovascular. Will continue hydration with isotonic saline, will continue on zosyn for antibiotic therapy, cell count at 6.9,. Blood pressure 158 systolic with heart rate at 62, holding valsartan to avoid hypotension.  2. Pulmonary. No signs of volume overload or aspiration, will continue to monitor oxymetry and give supplemental 0-2 per [place] to target 02 sat above 92%.  3. Nephrology. Renal function with cr at 0.76 with Na at 136 and K at 3,3 will change fluids to d51/2 and 20 kcl due to gi losses and low po intake. Follow renal panel in am, avoid hypotension and nephrotoxic medications,.  4, GI. Will continue supportive care with IV fluids and antibiotics,. Bowel rest and follow on surgery recommendations.   5. Neurology. Hx of etho abuse will continue with benzodiazepines as needed per protocol. Neuro checks per protocol.  6. dvt px  Patient is at moderate risk for worsening colitis.  DVT prophylaxis: (Lovenox/Heparin/SCD's/anticoagulated/None (if comfort care) Code Status: (Full/Partial - specify details) Family Communication: (Specify name, relationship & date discussed. NO "discussed with patient") Disposition Plan: (specify when and where you expect patient to be discharged). Include barriers to DC in this  tab.   Consultants:   surgery  Procedures: (Don't include imaging studies which can be auto populated. Include things that cannot be auto populated i.e. Echo, Carotid and venous dopplers, Foley, Bipap, HD, tubes/drains, wound vac, central lines etc)    Antimicrobials: (specify start and planned stop date. Auto populated tables are space occupying and do not give end dates)  zosyn    Subjective: Patient with abdominal pain after insertion of foley catheter, no nausea or vomiting. No fever or chills. Positive for anxiety, moderate in intensity and persistent, no improving or worsening factors and no associated symptoms.   Objective: Filed Vitals:   04/06/16 1432 04/06/16 1600 04/06/16 2133 04/07/16 0534  BP: 186/85 161/80 174/83 185/90  Pulse: 73 79 70 114  Temp: 98.3 F (36.8 C)  97.7 F (36.5 C) 97.9 F (36.6 C)  TempSrc: Oral  Tympanic   Resp: Height:      Weight:      SpO2: 98%  93% 86%    Intake/Output Summary (Last 24 hours) at 04/07/16 1135 Last data filed at 04/07/16 1124  Gross per 24 hour  Intake 1523.67 ml  Output   2030 ml  Net -506.33 ml   Filed Weights   04/05/16 1255  Weight: 80.287 kg (177 lb)    Examination:  General exam: deconditioned and anxious E ENT: oral mucosa moist, no conjunctival pallor. Respiratory system: Clear to auscultation. Respiratory effort normal. Mild decreased breath sounds at bases, no wheezing, rales or rhonchi. Cardiovascular system: S1 & S2 heard, RRR. No JVD, murmurs, rubs, gallops or clicks. No pedal edema. Gastrointestinal system: Abdomen is  Mild  distended, soft. Mild tender to deep palpation at the lower quadrants, no rebound or peritoneal signs. No organomegaly or masses felt. Normal bowel sounds heard. Central nervous system: Alert and oriented. No focal neurological deficits. Extremities: Symmetric 5 x 5 power. Skin: No rashes, lesions or ulcers     Data Reviewed: I have personally reviewed  following labs and imaging studies  CBC:  Recent Labs Lab 04/04/16 1809 04/05/16 0533 04/06/16 0543 04/07/16 0542  WBC 15.0* 13.0* 9.1 6.9  HGB 14.2 12.1* 11.4* 11.3*  HCT 43.6 38.3* 35.2* 35.0*  MCV 98.9 98.5 97.5 98.9  PLT 323 246 215 219   Basic Metabolic Panel:  Recent Labs Lab 04/04/16 1809 04/05/16 0533 04/06/16 0543 04/07/16 0542  NA 140 134* 133* 136  K 4.4 3.7 3.7 3.3*  CL 103 105 103 105  CO2 19* 20* 23 23  GLUCOSE 142* 85 104* 94  BUN <5* 6 <5* <5*  CREATININE 1.15 0.82 0.73 0.76  CALCIUM 9.2 8.1* 8.2* 8.4*  MG  --   --   --  1.7  PHOS  --   --   --  2.7   GFR: Estimated Creatinine Clearance: 94.5 mL/min (by C-G formula based on Cr of 0.76). Liver Function Tests:  Recent Labs Lab 04/04/16 1809 04/05/16 0533 04/06/16 0543 04/07/16 0542  AST 63* 39 34 38  ALT 36 24 22 24   ALKPHOS 95 73 66 66  BILITOT 1.5* 1.0 0.9 0.8  PROT 7.6 5.8* 5.7* 5.6*  ALBUMIN 3.2* 2.5* 2.3* 2.2*    Recent Labs Lab 04/04/16 1809  LIPASE 41   No results for input(s): AMMONIA in the last 168 hours. Coagulation Profile:  Recent Labs Lab 04/05/16 0135  INR 1.17   Cardiac Enzymes: No results for input(s): CKTOTAL, CKMB, CKMBINDEX, TROPONINI in the last 168 hours. BNP (last 3 results) No results for input(s): PROBNP in the last 8760 hours. HbA1C: No results for input(s): HGBA1C in the last 72 hours. CBG: No results for input(s): GLUCAP in the last 168 hours. Lipid Profile: No results for input(s): CHOL, HDL, LDLCALC, TRIG, CHOLHDL, LDLDIRECT in the last 72 hours. Thyroid Function Tests:  Recent Labs  04/07/16 0542  TSH 3.525   Anemia Panel: No results for input(s): VITAMINB12, FOLATE, FERRITIN, TIBC, IRON, RETICCTPCT in the last 72 hours. Sepsis Labs:  Recent Labs Lab 04/04/16 2018 04/04/16 2310 04/05/16 0143  LATICACIDVEN 3.37* 1.08 1.09    Recent Results (from the past 240 hour(s))  Culture, blood (Routine X 2) w Reflex to ID Panel     Status:  None (Preliminary result)   Collection Time: 04/04/16  8:02 PM  Result Value Ref Range Status   Specimen Description BLOOD LEFT HAND  Final   Special Requests BOTTLES DRAWN AEROBIC AND ANAEROBIC 5CC  Final   Culture NO GROWTH 3 DAYS  Final   Report Status PENDING  Incomplete  Culture, blood (Routine X 2) w Reflex to ID Panel     Status: None (Preliminary result)   Collection Time: 04/04/16  8:03 PM  Result Value Ref Range Status   Specimen Description BLOOD RIGHT HAND  Final   Special Requests BOTTLES DRAWN AEROBIC ONLY 4CC  Final   Culture NO GROWTH 3 DAYS  Final   Report Status PENDING  Incomplete  Urine culture     Status: Abnormal   Collection Time: 04/04/16  9:29 PM  Result Value Ref Range Status   Specimen Description URINE, RANDOM  Final   Special Requests  NONE  Final   Culture <10,000 COLONIES/mL INSIGNIFICANT GROWTH (A)  Final   Report Status 04/06/2016 FINAL  Final  C difficile quick scan w PCR reflex     Status: None   Collection Time: 04/06/16  9:53 AM  Result Value Ref Range Status   C Diff antigen NEGATIVE NEGATIVE Final   C Diff toxin NEGATIVE NEGATIVE Final   C Diff interpretation Negative for toxigenic C. difficile  Final         Radiology Studies: No results found.      Scheduled Meds: . cloNIDine  0.1 mg Oral TID  . enoxaparin (LOVENOX) injection  40 mg Subcutaneous Daily  . feeding supplement  1 Container Oral TID BM  . folic acid  1 mg Oral Daily  . irbesartan  150 mg Oral Daily  . multivitamin with minerals  1 tablet Oral Daily  . nicotine  21 mg Transdermal Daily  . piperacillin-tazobactam (ZOSYN)  IV  3.375 g Intravenous Q8H  . thiamine  100 mg Oral Daily   Continuous Infusions: . sodium chloride 1,000 mL (04/06/16 1841)     LOS: 2 days        Laquinton Bihm Annett Gulaaniel Carolann Brazell, MD [facility] Pager [phone]  If 7PM-7AM, please contact night-coverage [email or web address] Password [email or web address] 04/07/2016, 11:35 AM

## 2016-04-08 DIAGNOSIS — K5720 Diverticulitis of large intestine with perforation and abscess without bleeding: Secondary | ICD-10-CM

## 2016-04-08 LAB — CBC WITH DIFFERENTIAL/PLATELET
BASOS PCT: 0 %
Basophils Absolute: 0 10*3/uL (ref 0.0–0.1)
EOS ABS: 0.2 10*3/uL (ref 0.0–0.7)
EOS PCT: 2 %
HCT: 37.6 % — ABNORMAL LOW (ref 39.0–52.0)
Hemoglobin: 12.3 g/dL — ABNORMAL LOW (ref 13.0–17.0)
LYMPHS ABS: 1 10*3/uL (ref 0.7–4.0)
Lymphocytes Relative: 11 %
MCH: 32.3 pg (ref 26.0–34.0)
MCHC: 32.7 g/dL (ref 30.0–36.0)
MCV: 98.7 fL (ref 78.0–100.0)
MONOS PCT: 12 %
Monocytes Absolute: 1 10*3/uL (ref 0.1–1.0)
NEUTROS PCT: 75 %
Neutro Abs: 6.3 10*3/uL (ref 1.7–7.7)
Platelets: 221 10*3/uL (ref 150–400)
RBC: 3.81 MIL/uL — ABNORMAL LOW (ref 4.22–5.81)
RDW: 16.1 % — AB (ref 11.5–15.5)
WBC: 8.5 10*3/uL (ref 4.0–10.5)

## 2016-04-08 LAB — BASIC METABOLIC PANEL
Anion gap: 8 (ref 5–15)
CHLORIDE: 107 mmol/L (ref 101–111)
CO2: 21 mmol/L — ABNORMAL LOW (ref 22–32)
CREATININE: 0.56 mg/dL — AB (ref 0.61–1.24)
Calcium: 8.3 mg/dL — ABNORMAL LOW (ref 8.9–10.3)
GFR calc non Af Amer: >60 mL/min (ref >60)
Glucose, Bld: 113 mg/dL — ABNORMAL HIGH (ref 65–99)
Potassium: 3.5 mmol/L (ref 3.5–5.1)
SODIUM: 136 mmol/L (ref 135–145)

## 2016-04-08 NOTE — Progress Notes (Signed)
PROGRESS NOTE    Alvin Benton  ZOX:[id number] DOB: 03/27/1951 DOA: 04/04/2016 PCP: Neena Rhymes, MD (Confirm with patient/family/NH records and if not entered, this HAS to be entered at [facility] point of entry. "No PCP" if truly none.)   Brief Narrative: (Start on day 1 of progress note - keep it brief and live)  Diverticulitis with colovesical fistula, 66 yo male with IBS, etho abuse, atrial fibrillation. Started on supportive care and IV antibiotics, follow on surgery recommendations  Assessment & Plan:   Principal Problem:   Diverticulitis Active Problems:   HTN (hypertension)   Alcohol abuse   Colovesical fistula   Hypoxia   Atrial fibrillation (HCC)   1. Cardiovascular.  Blood pressure systolic 170's, will continue to hold on antihypertensive agents for now. Patient euvolemic, will continue IV fluids with dextrose at 75 cc/hr.  2. Pulmonary. No signs of volume overload or aspiration, will continue to monitor oxymetry and give supplemental 0-2 per [place] to target 02 sat above 92%.  3. Nephrology. Renal function with cr at 0.56 with Na at 136 and K at 3,5  will continue  fluids with d51/2 and 20 kcl. Follow renal panel in am, avoid hypotension and nephrotoxic medications,.  4, GI. Will continue supportive care with IV fluids and antibiotics,. Bowel rest and follow on surgery recommendations. Continue pain control. No signs of worsening.   5. Neurology. Hx of etho abuse will continue with benzodiazepines as needed per protocol. Neuro checks per protocol. Noted anxiety.   6. dvt px  Patient is at moderate risk for worsening colitis   DVT prophylaxis: (Lovenox/Heparin/SCD's/anticoagulated/None (if comfort care) Code Status: (Full/Partial - specify details) Family Communication: (Specify name, relationship & date discussed. NO "discussed with patient") Disposition Plan: (specify when and where you expect patient to be discharged). Include barriers to DC in this  tab.   Consultants:   surgery  Procedures: (Don't include imaging studies which can be auto populated. Include things that cannot be auto populated i.e. Echo, Carotid and venous dopplers, Foley, Bipap, HD, tubes/drains, wound vac, central lines etc)    Antimicrobials: (specify start and planned stop date. Auto populated tables are space occupying and do not give end dates)  Zosyn #4   Subjective: Patient with abdominal pain, hypogastrium, no nausea or vomiting associated, pain is colic in nature, moderate in intensity, no radiation, improved with pain medications, no worsening factors.  Objective: Filed Vitals:   04/07/16 1423 04/07/16 2105 04/08/16 0218 04/08/16 0522  BP: 158/61 161/87 171/82 175/84  Pulse:  73 77 71  Temp:  98.1 F (36.7 C) 99.1 F (37.3 C) 99 F (37.2 C)  TempSrc:   Oral Oral  Resp:  18 18 18   Height:      Weight:      SpO2:  97% 95% 94%    Intake/Output Summary (Last 24 hours) at 04/08/16 0916 Last data filed at 04/08/16 0737  Gross per 24 hour  Intake 2516.5 ml  Output   3850 ml  Net -1333.5 ml   Filed Weights   04/05/16 1255  Weight: 80.287 kg (177 lb)    Examination:  General exam: deconditioned and anxious. E ENT: mild conjunctival pallor, oral mucosa moist. Respiratory system: Clear to auscultation. Respiratory effort normal. No wheezing, rales or rhonchi. Bilateral vesicular breath sounds.  Cardiovascular system: S1 & S2 heard, RRR. No JVD, murmurs, rubs, gallops or clicks. No pedal edema. Gastrointestinal system: Abdomen is mild distended, soft but tender at the lower quadrantgs. No organomegaly or  masses felt. Normal bowel sounds heard. Central nervous system: Alert and oriented. No focal neurological deficits. Extremities: Symmetric 5 x 5 power. Skin: No rashes, lesions or ulcers Psychiatry: Judgement and insight appear normal. Mood & affect appropriate. Positive for anxiety.    Data Reviewed: I have personally reviewed  following labs and imaging studies  CBC:  Recent Labs Lab 04/04/16 1809 04/05/16 0533 04/06/16 0543 04/07/16 0542 04/08/16 0546  WBC 15.0* 13.0* 9.1 6.9 8.5  NEUTROABS  --   --   --   --  6.3  HGB 14.2 12.1* 11.4* 11.3* 12.3*  HCT 43.6 38.3* 35.2* 35.0* 37.6*  MCV 98.9 98.5 97.5 98.9 98.7  PLT 323 246 215 219 221   Basic Metabolic Panel:  Recent Labs Lab 04/04/16 1809 04/05/16 0533 04/06/16 0543 04/07/16 0542 04/08/16 0546  NA 140 134* 133* 136 136  K 4.4 3.7 3.7 3.3* 3.5  CL 103 105 103 105 107  CO2 19* 20* 23 23 21*  GLUCOSE 142* 85 104* 94 113*  BUN <5* 6 <5* <5* <5*  CREATININE 1.15 0.82 0.73 0.76 0.56*  CALCIUM 9.2 8.1* 8.2* 8.4* 8.3*  MG  --   --   --  1.7  --   PHOS  --   --   --  2.7  --    GFR: Estimated Creatinine Clearance: 94.5 mL/min (by C-G formula based on Cr of 0.56). Liver Function Tests:  Recent Labs Lab 04/04/16 1809 04/05/16 0533 04/06/16 0543 04/07/16 0542  AST 63* 39 34 38  ALT 36 ALKPHOS 95 73 66 66  BILITOT 1.5* 1.0 0.9 0.8  PROT 7.6 5.8* 5.7* 5.6*  ALBUMIN 3.2* 2.5* 2.3* 2.2*    Recent Labs Lab 04/04/16 1809  LIPASE 41   No results for input(s): AMMONIA in the last 168 hours. Coagulation Profile:  Recent Labs Lab 04/05/16 0135  INR 1.17   Cardiac Enzymes: No results for input(s): CKTOTAL, CKMB, CKMBINDEX, TROPONINI in the last 168 hours. BNP (last 3 results) No results for input(s): PROBNP in the last 8760 hours. HbA1C: No results for input(s): HGBA1C in the last 72 hours. CBG: No results for input(s): GLUCAP in the last 168 hours. Lipid Profile: No results for input(s): CHOL, HDL, LDLCALC, TRIG, CHOLHDL, LDLDIRECT in the last 72 hours. Thyroid Function Tests:  Recent Labs  04/07/16 0542  TSH 3.525   Anemia Panel: No results for input(s): VITAMINB12, FOLATE, FERRITIN, TIBC, IRON, RETICCTPCT in the last 72 hours. Sepsis Labs:  Recent Labs Lab 04/04/16 2018 04/04/16 2310 04/05/16 0143   LATICACIDVEN 3.37* 1.08 1.09    Recent Results (from the past 240 hour(s))  Culture, blood (Routine X 2) w Reflex to ID Panel     Status: None (Preliminary result)   Collection Time: 04/04/16  8:02 PM  Result Value Ref Range Status   Specimen Description BLOOD LEFT HAND  Final   Special Requests BOTTLES DRAWN AEROBIC AND ANAEROBIC 5CC  Final   Culture NO GROWTH 3 DAYS  Final   Report Status PENDING  Incomplete  Culture, blood (Routine X 2) w Reflex to ID Panel     Status: None (Preliminary result)   Collection Time: 04/04/16  8:03 PM  Result Value Ref Range Status   Specimen Description BLOOD RIGHT HAND  Final   Special Requests BOTTLES DRAWN AEROBIC ONLY 4CC  Final   Culture NO GROWTH 3 DAYS  Final   Report Status PENDING  Incomplete  Urine culture  Status: Abnormal   Collection Time: 04/04/16  9:29 PM  Result Value Ref Range Status   Specimen Description URINE, RANDOM  Final   Special Requests NONE  Final   Culture <10,000 COLONIES/mL INSIGNIFICANT GROWTH (A)  Final   Report Status 04/06/2016 FINAL  Final  C difficile quick scan w PCR reflex     Status: None   Collection Time: 04/06/16  9:53 AM  Result Value Ref Range Status   C Diff antigen NEGATIVE NEGATIVE Final   C Diff toxin NEGATIVE NEGATIVE Final   C Diff interpretation Negative for toxigenic C. difficile  Final         Radiology Studies: No results found.      Scheduled Meds: . enoxaparin (LOVENOX) injection  40 mg Subcutaneous Daily  . feeding supplement  1 Container Oral TID BM  . folic acid  1 mg Oral Daily  . multivitamin with minerals  1 tablet Oral Daily  . nicotine  21 mg Transdermal Daily  . piperacillin-tazobactam (ZOSYN)  IV  3.375 g Intravenous Q8H  . thiamine  100 mg Oral Daily   Continuous Infusions: . dextrose 5 % and 0.45 % NaCl with KCl 20 mEq/L 75 mL/hr at 04/08/16 0509     LOS: 3 days       Alvin Benton Annett Gulaaniel Helayne Metsker, MD [facility] Pager [phone]  If 7PM-7AM,  please contact night-coverage [email or web address] Password [email or web address] 04/08/2016, 9:16 AM

## 2016-04-08 NOTE — Progress Notes (Signed)
Patient ID: Alvin Benton, male   DOB: 26-Aug-1951, 65 y.o.   MRN: [id number]     [facility]      [address] [place], [place] [address]    Phone: [phone] FAX: [phone]     Subjective: Does not feel as good today. Wbc normal. Afebrile. Tolerating fulls.   Objective:  Vital signs:  Filed Vitals:   04/07/16 2105 04/08/16 0218 04/08/16 0522 04/08/16 1148  BP: 161/87 171/82 175/84 177/82  Pulse: 73 77 71 72  Temp: 98.1 F (36.7 C) 99.1 F (37.3 C) 99 F (37.2 C) 98.8 F (37.1 C)  TempSrc:  Oral Oral   Resp: '18 18 18 18  '$ Height:      Weight:      SpO2: 97% 95% 94% 96%    Last BM Date: 04/06/16  Intake/Output   Yesterday:  06/11 0701 - 06/12 0700 In: 2516.5 [P.O.:990; I.V.:1476.5; IV Piggyback:50] Out: 2050 [Urine:2050] This shift:  Total I/O In: 1113 [P.O.:1113] Out: 1900 [Urine:1900]    Physical Exam: General: Pt awake/alert/oriented x4 in no acute distress Abdomen: Soft. Nondistended. ttp llq.  No evidence of peritonitis. No incarcerated hernias.    Problem List:   Principal Problem:   Diverticulitis Active Problems:   HTN (hypertension)   Alcohol abuse   Colovesical fistula   Hypoxia   Atrial fibrillation (HCC)    Results:   Labs: Results for orders placed or performed during the hospital encounter of 04/04/16 (from the past 48 hour(s))  TSH     Status: None   Collection Time: 04/07/16  5:42 AM  Result Value Ref Range   TSH 3.525 0.350 - 4.500 uIU/mL  Comprehensive metabolic panel     Status: Abnormal   Collection Time: 04/07/16  5:42 AM  Result Value Ref Range   Sodium 136 135 - 145 mmol/L   Potassium 3.3 (L) 3.5 - 5.1 mmol/L   Chloride 105 101 - 111 mmol/L   CO2 23 22 - 32 mmol/L   Glucose, Bld 94 65 - 99 mg/dL   BUN <5 (L) 6 - 20 mg/dL   Creatinine, Ser 0.76 0.61 - 1.24 mg/dL   Calcium 8.4 (L) 8.9 - 10.3 mg/dL   Total Protein 5.6 (L) 6.5 - 8.1 g/dL   Albumin 2.2 (L) 3.5 -  5.0 g/dL   AST 38 15 - 41 U/L   ALT 24 17 - 63 U/L   Alkaline Phosphatase 66 38 - 126 U/L   Total Bilirubin 0.8 0.3 - 1.2 mg/dL   GFR calc non Af Amer >60 >60 mL/min   GFR calc Af Amer >60 >60 mL/min    Comment: (NOTE) The eGFR has been calculated using the CKD EPI equation. This calculation has not been validated in all clinical situations. eGFR's persistently <60 mL/min signify possible Chronic Kidney Disease.    Anion gap 8 5 - 15  CBC     Status: Abnormal   Collection Time: 04/07/16  5:42 AM  Result Value Ref Range   WBC 6.9 4.0 - 10.5 K/uL   RBC 3.54 (L) 4.22 - 5.81 MIL/uL   Hemoglobin 11.3 (L) 13.0 - 17.0 g/dL   HCT 35.0 (L) 39.0 - 52.0 %   MCV 98.9 78.0 - 100.0 fL   MCH 31.9 26.0 - 34.0 pg   MCHC 32.3 30.0 - 36.0 g/dL   RDW 15.7 (H) 11.5 - 15.5 %   Platelets 219 150 - 400 K/uL  Magnesium  Status: None   Collection Time: 04/07/16  5:42 AM  Result Value Ref Range   Magnesium 1.7 1.7 - 2.4 mg/dL  Phosphorus     Status: None   Collection Time: 04/07/16  5:42 AM  Result Value Ref Range   Phosphorus 2.7 2.5 - 4.6 mg/dL  CBC with Differential/Platelet     Status: Abnormal   Collection Time: 04/08/16  5:46 AM  Result Value Ref Range   WBC 8.5 4.0 - 10.5 K/uL   RBC 3.81 (L) 4.22 - 5.81 MIL/uL   Hemoglobin 12.3 (L) 13.0 - 17.0 g/dL   HCT 37.6 (L) 39.0 - 52.0 %   MCV 98.7 78.0 - 100.0 fL   MCH 32.3 26.0 - 34.0 pg   MCHC 32.7 30.0 - 36.0 g/dL   RDW 16.1 (H) 11.5 - 15.5 %   Platelets 221 150 - 400 K/uL   Neutrophils Relative % 75 %   Neutro Abs 6.3 1.7 - 7.7 K/uL   Lymphocytes Relative 11 %   Lymphs Abs 1.0 0.7 - 4.0 K/uL   Monocytes Relative 12 %   Monocytes Absolute 1.0 0.1 - 1.0 K/uL   Eosinophils Relative 2 %   Eosinophils Absolute 0.2 0.0 - 0.7 K/uL   Basophils Relative 0 %   Basophils Absolute 0.0 0.0 - 0.1 K/uL  Basic metabolic panel     Status: Abnormal   Collection Time: 04/08/16  5:46 AM  Result Value Ref Range   Sodium 136 135 - 145 mmol/L    Potassium 3.5 3.5 - 5.1 mmol/L   Chloride 107 101 - 111 mmol/L   CO2 21 (L) 22 - 32 mmol/L   Glucose, Bld 113 (H) 65 - 99 mg/dL   BUN <5 (L) 6 - 20 mg/dL   Creatinine, Ser 0.56 (L) 0.61 - 1.24 mg/dL   Calcium 8.3 (L) 8.9 - 10.3 mg/dL   GFR calc non Af Amer >60 >60 mL/min   GFR calc Af Amer >60 >60 mL/min    Comment: (NOTE) The eGFR has been calculated using the CKD EPI equation. This calculation has not been validated in all clinical situations. eGFR's persistently <60 mL/min signify possible Chronic Kidney Disease.    Anion gap 8 5 - 15    Imaging / Studies: No results found.  Medications / Allergies:  Scheduled Meds: . enoxaparin (LOVENOX) injection  40 mg Subcutaneous Daily  . feeding supplement  1 Container Oral TID BM  . folic acid  1 mg Oral Daily  . multivitamin with minerals  1 tablet Oral Daily  . nicotine  21 mg Transdermal Daily  . piperacillin-tazobactam (ZOSYN)  IV  3.375 g Intravenous Q8H  . thiamine  100 mg Oral Daily   Continuous Infusions: . dextrose 5 % and 0.45 % NaCl with KCl 20 mEq/L 75 mL/hr at 04/08/16 0509   PRN Meds:.acetaminophen, albuterol, fluticasone, hydrALAZINE, LORazepam **OR** [DISCONTINUED] LORazepam, morphine injection, ondansetron **OR** ondansetron (ZOFRAN) IV  Antibiotics: Anti-infectives    Start     Dose/Rate Route Frequency Ordered Stop   04/05/16 0400  piperacillin-tazobactam (ZOSYN) IVPB 3.375 g     3.375 g 12.5 mL/hr over 240 Minutes Intravenous Every 8 hours 04/05/16 0041     04/04/16 2000  piperacillin-tazobactam (ZOSYN) IVPB 3.375 g     3.375 g 100 mL/hr over 30 Minutes Intravenous  Once 04/04/16 1951 04/04/16 2037       Assessment/Plan: Diverticulitis with colovesical fistula - acute on chronic - continue IV antibiotics, continue foley - likely  colectomy/colostomy surgery after a few days of IV, then transitioning to PO antibiotics.  ID: Zosyn Day #4 FEN: full liquids  DVT: Lovenox, SCD's Dispo-not surgically  stable. OP follow up with Dr. Leighton Ruff   Acute stress response/anxiety-wife moved back to [facility], cat died, little enjoyment in work/life.  Very anxious and having tremors.  He plans on seeing a counselor outpatient.  May need some meds short term, leave up to primary team   Erby Pian, [facility] Pager [phone]) For consults and floor pages call [phone]([id number])  04/08/2016 12:26 PM

## 2016-04-09 LAB — CBC WITH DIFFERENTIAL/PLATELET
Basophils Absolute: 0 10*3/uL (ref 0.0–0.1)
Basophils Relative: 0 %
EOS PCT: 4 %
Eosinophils Absolute: 0.4 10*3/uL (ref 0.0–0.7)
HCT: 38 % — ABNORMAL LOW (ref 39.0–52.0)
Hemoglobin: 12.3 g/dL — ABNORMAL LOW (ref 13.0–17.0)
LYMPHS ABS: 1.3 10*3/uL (ref 0.7–4.0)
LYMPHS PCT: 14 %
MCH: 31.7 pg (ref 26.0–34.0)
MCHC: 32.4 g/dL (ref 30.0–36.0)
MCV: 97.9 fL (ref 78.0–100.0)
MONO ABS: 1.4 10*3/uL — AB (ref 0.1–1.0)
MONOS PCT: 14 %
Neutro Abs: 6.7 10*3/uL (ref 1.7–7.7)
Neutrophils Relative %: 68 %
PLATELETS: 254 10*3/uL (ref 150–400)
RBC: 3.88 MIL/uL — AB (ref 4.22–5.81)
RDW: 16.1 % — ABNORMAL HIGH (ref 11.5–15.5)
WBC: 9.8 10*3/uL (ref 4.0–10.5)

## 2016-04-09 LAB — CULTURE, BLOOD (ROUTINE X 2)
CULTURE: NO GROWTH
CULTURE: NO GROWTH

## 2016-04-09 LAB — BASIC METABOLIC PANEL
Anion gap: 5 (ref 5–15)
CO2: 22 mmol/L (ref 22–32)
Calcium: 8.6 mg/dL — ABNORMAL LOW (ref 8.9–10.3)
Chloride: 111 mmol/L (ref 101–111)
Creatinine, Ser: 0.6 mg/dL — ABNORMAL LOW (ref 0.61–1.24)
GFR calc Af Amer: >60 mL/min (ref >60)
GLUCOSE: 101 mg/dL — AB (ref 65–99)
POTASSIUM: 3.8 mmol/L (ref 3.5–5.1)
Sodium: 138 mmol/L (ref 135–145)

## 2016-04-09 MED ORDER — LORAZEPAM 1 MG PO TABS
1.0000 mg | ORAL_TABLET | ORAL | Status: DC | PRN
  Administered 2016-04-09 – 2016-04-11 (×6): 1 mg via ORAL
  Filled 2016-04-09 (×6): qty 1

## 2016-04-09 MED ORDER — CLONAZEPAM 1 MG PO TABS
1.0000 mg | ORAL_TABLET | Freq: Two times a day (BID) | ORAL | Status: DC
  Administered 2016-04-09 – 2016-04-11 (×5): 1 mg via ORAL
  Filled 2016-04-09 (×5): qty 1

## 2016-04-09 MED ORDER — ENSURE ENLIVE PO LIQD
237.0000 mL | Freq: Two times a day (BID) | ORAL | Status: DC
  Administered 2016-04-09 – 2016-04-11 (×4): 237 mL via ORAL

## 2016-04-09 NOTE — Progress Notes (Signed)
PROGRESS NOTE    Alvin Benton  ZOX:[id number] DOB: 02-25-1951 DOA: 04/04/2016 PCP: Neena Rhymes, MD (Confirm with patient/family/NH records and if not entered, this HAS to be entered at [facility] point of entry. "No PCP" if truly none.)   Brief Narrative: (Start on day 1 of progress note - keep it brief and live) Diverticulitis with colovesical fistula, 65 yo male with IBS, etho abuse, atrial fibrillation. Started on supportive care and IV antibiotics, follow on surgery recommendations  Assessment & Plan:   Principal Problem:   Diverticulitis Active Problems:   HTN (hypertension)   Alcohol abuse   Colovesical fistula   Hypoxia   Atrial fibrillation (HCC)  1. Cardiovascular.Continue hydration with dextrose, patient hemodynamically stable.  2. Pulmonary. No signs of volume overload or aspiration, will continue to monitor oxymetry and give supplemental 0-2 per [place] to target 02 sat above 92%.  3. Nephrology. Renal function with cr at 0.60 with Na at 138 and K at 3,8 will continue fluids with d51/2 and 20 kcl. Follow renal panel in am, avoid hypotension and nephrotoxic medications,.  4, GI. Will continue supportive care with IV fluids and antibiotics. Improved abdominal pain, no nausea or vomiting, tolerating well full liquid diet. Possible transition to po antibiotics in the next 24 to 48 hours, continue to follow surgery recommendations.   5. Neurology. No recent alcohol abuse, will change lorazepam to q4 hours as needed and will add scheduled clonazepam bid, will continue neuro checks per unit protocol.   6. dvt px  Patient is at moderate risk for worsening colitis    DVT prophylaxis: (Lovenox/Heparin/SCD's/anticoagulated/None (if comfort care) Code Status: (Full/Partial - specify details) Family Communication: (Specify name, relationship & date discussed. NO "discussed with patient") Disposition Plan: (specify when and where you expect patient to be discharged). Include barriers  to DC in this tab.   Consultants:   surgery  Procedures: (Don't include imaging studies which can be auto populated. Include things that cannot be auto populated i.e. Echo, Carotid and venous dopplers, Foley, Bipap, HD, tubes/drains, wound vac, central lines etc)    Antimicrobials: (specify start and planned stop date. Auto populated tables are space occupying and do not give end dates)  Zosyn   Subjective: Abdominal pain has been improving, patient tolerating po well, no nausea or vomiting, pain predominant on the lower abdomen, worse to touch, improved with pain medications. Positive anxiety, moderate to severe, associated with tremors, improved with benzodiazepines.   Objective: Filed Vitals:   04/08/16 1840 04/09/16 0021 04/09/16 0203 04/09/16 0610  BP: 160/72 192/86 195/85 191/84  Pulse: 76 71 64 94  Temp: 98.4 F (36.9 C) 97.7 F (36.5 C)  98.1 F (36.7 C)  TempSrc: Oral Oral  Oral  Resp: 18 18 20 20   Height:      Weight:      SpO2: 96% 96% 97% 97%    Intake/Output Summary (Last 24 hours) at 04/09/16 1303 Last data filed at 04/09/16 0900  Gross per 24 hour  Intake 2340.75 ml  Output   5600 ml  Net -3259.25 ml   Filed Weights   04/05/16 1255  Weight: 80.287 kg (177 lb)    Examination:  General exam: not in pain, positive anxiety.  E ENT: oral mucosa moist, no conjunctival pallor. Respiratory system: Clear to auscultation. Respiratory effort normal. Mild decreased breath sounds at bases. Cardiovascular system: S1 & S2 heard, RRR. No JVD, murmurs, rubs, gallops or clicks. No pedal edema. Gastrointestinal system: Abdomen is nondistended, soft, mild  tender to deep palpation on the lower quadrants. No organomegaly or masses felt. Normal bowel sounds heard. Central nervous system: Alert and oriented. No focal neurological deficits. Positive  Tremors. Extremities: Symmetric 5 x 5 power. Skin: No rashes, lesions or ulcers Psychiatry: anxiety   Data Reviewed: I  have personally reviewed following labs and imaging studies  CBC:  Recent Labs Lab 04/05/16 0533 04/06/16 0543 04/07/16 0542 04/08/16 0546 04/09/16 0527  WBC 13.0* 9.1 6.9 8.5 9.8  NEUTROABS  --   --   --  6.3 6.7  HGB 12.1* 11.4* 11.3* 12.3* 12.3*  HCT 38.3* 35.2* 35.0* 37.6* 38.0*  MCV 98.5 97.5 98.9 98.7 97.9  PLT 246 215 219 221 254   Basic Metabolic Panel:  Recent Labs Lab 04/05/16 0533 04/06/16 0543 04/07/16 0542 04/08/16 0546 04/09/16 0527  NA 134* 133* 136 136 138  K 3.7 3.7 3.3* 3.5 3.8  CL 105 103 105 107 111  CO2 20* 23 23 21* 22  GLUCOSE 85 104* 94 113* 101*  BUN 6 <5* <5* <5* <5*  CREATININE 0.82 0.73 0.76 0.56* 0.60*  CALCIUM 8.1* 8.2* 8.4* 8.3* 8.6*  MG  --   --  1.7  --   --   PHOS  --   --  2.7  --   --    GFR: Estimated Creatinine Clearance: 94.5 mL/min (by C-G formula based on Cr of 0.6). Liver Function Tests:  Recent Labs Lab 04/04/16 1809 04/05/16 0533 04/06/16 0543 04/07/16 0542  AST 63* 39 34 38  ALT 36 24 22 24   ALKPHOS 95 73 66 66  BILITOT 1.5* 1.0 0.9 0.8  PROT 7.6 5.8* 5.7* 5.6*  ALBUMIN 3.2* 2.5* 2.3* 2.2*    Recent Labs Lab 04/04/16 1809  LIPASE 41   No results for input(s): AMMONIA in the last 168 hours. Coagulation Profile:  Recent Labs Lab 04/05/16 0135  INR 1.17   Cardiac Enzymes: No results for input(s): CKTOTAL, CKMB, CKMBINDEX, TROPONINI in the last 168 hours. BNP (last 3 results) No results for input(s): PROBNP in the last 8760 hours. HbA1C: No results for input(s): HGBA1C in the last 72 hours. CBG: No results for input(s): GLUCAP in the last 168 hours. Lipid Profile: No results for input(s): CHOL, HDL, LDLCALC, TRIG, CHOLHDL, LDLDIRECT in the last 72 hours. Thyroid Function Tests:  Recent Labs  04/07/16 0542  TSH 3.525   Anemia Panel: No results for input(s): VITAMINB12, FOLATE, FERRITIN, TIBC, IRON, RETICCTPCT in the last 72 hours. Sepsis Labs:  Recent Labs Lab 04/04/16 2018  04/04/16 2310 04/05/16 0143  LATICACIDVEN 3.37* 1.08 1.09    Recent Results (from the past 240 hour(s))  Culture, blood (Routine X 2) w Reflex to ID Panel     Status: None (Preliminary result)   Collection Time: 04/04/16  8:02 PM  Result Value Ref Range Status   Specimen Description BLOOD LEFT HAND  Final   Special Requests BOTTLES DRAWN AEROBIC AND ANAEROBIC 5CC  Final   Culture NO GROWTH 4 DAYS  Final   Report Status PENDING  Incomplete  Culture, blood (Routine X 2) w Reflex to ID Panel     Status: None (Preliminary result)   Collection Time: 04/04/16  8:03 PM  Result Value Ref Range Status   Specimen Description BLOOD RIGHT HAND  Final   Special Requests BOTTLES DRAWN AEROBIC ONLY 4CC  Final   Culture NO GROWTH 4 DAYS  Final   Report Status PENDING  Incomplete  Urine culture  Status: Abnormal   Collection Time: 04/04/16  9:29 PM  Result Value Ref Range Status   Specimen Description URINE, RANDOM  Final   Special Requests NONE  Final   Culture <10,000 COLONIES/mL INSIGNIFICANT GROWTH (A)  Final   Report Status 04/06/2016 FINAL  Final  C difficile quick scan w PCR reflex     Status: None   Collection Time: 04/06/16  9:53 AM  Result Value Ref Range Status   C Diff antigen NEGATIVE NEGATIVE Final   C Diff toxin NEGATIVE NEGATIVE Final   C Diff interpretation Negative for toxigenic C. difficile  Final         Radiology Studies: No results found.      Scheduled Meds: . enoxaparin (LOVENOX) injection  40 mg Subcutaneous Daily  . feeding supplement (ENSURE ENLIVE)  237 mL Oral BID BM  . folic acid  1 mg Oral Daily  . multivitamin with minerals  1 tablet Oral Daily  . nicotine  21 mg Transdermal Daily  . piperacillin-tazobactam (ZOSYN)  IV  3.375 g Intravenous Q8H  . thiamine  100 mg Oral Daily   Continuous Infusions: . dextrose 5 % and 0.45 % NaCl with KCl 20 mEq/L 75 mL/hr at 04/09/16 0739     LOS: 4 days        Harlon Kutner Annett Gula, MD [facility] Pager [phone]  If 7PM-7AM, please contact night-coverage [email or web address] Password [email or web address] 04/09/2016, 1:03 PM

## 2016-04-09 NOTE — Progress Notes (Signed)
Patient ID: Alvin Benton, male   DOB: 03-06-1951, 65 y.o.   MRN: [id number]     [facility]      [address] [place], [place] [address]    Phone: [phone] FAX: [phone]     Subjective: Anxious.  No n/v. Having BMs. Foley in place with good uop. Appetite good. Pain is better. Sore in the rlq this am.  Afebrile. Wbc normal.  Objective:  Vital signs:  Filed Vitals:   04/08/16 1840 04/09/16 0021 04/09/16 0203 04/09/16 0610  BP: 160/72 192/86 195/85 191/84  Pulse: 76 71 64 94  Temp: 98.4 F (36.9 C) 97.7 F (36.5 C)  98.1 F (36.7 C)  TempSrc: Oral Oral  Oral  Resp: '18 18 20 20  '$ Height:      Weight:      SpO2: 96% 96% 97% 97%    Last BM Date: 04/08/16  Intake/Output   Yesterday:  06/12 0701 - 06/13 0700 In: 2951.8 [P.O.:1333; I.V.:1518.8; IV Piggyback:100] Out: 6010 [Urine:6725] This shift:  Total I/O In: 722 [P.O.:722] Out: 1600 [Urine:1600]  Physical Exam: General: Pt awake/alert/oriented x4 in no acute distress Abdomen: Soft. Nondistended. ttp rlq.  No evidence of peritonitis. No incarcerated hernias.   Problem List:   Principal Problem:   Diverticulitis Active Problems:   HTN (hypertension)   Alcohol abuse   Colovesical fistula   Hypoxia   Atrial fibrillation (HCC)    Results:   Labs: Results for orders placed or performed during the hospital encounter of 04/04/16 (from the past 48 hour(s))  CBC with Differential/Platelet     Status: Abnormal   Collection Time: 04/08/16  5:46 AM  Result Value Ref Range   WBC 8.5 4.0 - 10.5 K/uL   RBC 3.81 (L) 4.22 - 5.81 MIL/uL   Hemoglobin 12.3 (L) 13.0 - 17.0 g/dL   HCT 37.6 (L) 39.0 - 52.0 %   MCV 98.7 78.0 - 100.0 fL   MCH 32.3 26.0 - 34.0 pg   MCHC 32.7 30.0 - 36.0 g/dL   RDW 16.1 (H) 11.5 - 15.5 %   Platelets 221 150 - 400 K/uL   Neutrophils Relative % 75 %   Neutro Abs 6.3 1.7 - 7.7 K/uL   Lymphocytes Relative 11 %   Lymphs Abs 1.0 0.7  - 4.0 K/uL   Monocytes Relative 12 %   Monocytes Absolute 1.0 0.1 - 1.0 K/uL   Eosinophils Relative 2 %   Eosinophils Absolute 0.2 0.0 - 0.7 K/uL   Basophils Relative 0 %   Basophils Absolute 0.0 0.0 - 0.1 K/uL  Basic metabolic panel     Status: Abnormal   Collection Time: 04/08/16  5:46 AM  Result Value Ref Range   Sodium 136 135 - 145 mmol/L   Potassium 3.5 3.5 - 5.1 mmol/L   Chloride 107 101 - 111 mmol/L   CO2 21 (L) 22 - 32 mmol/L   Glucose, Bld 113 (H) 65 - 99 mg/dL   BUN <5 (L) 6 - 20 mg/dL   Creatinine, Ser 0.56 (L) 0.61 - 1.24 mg/dL   Calcium 8.3 (L) 8.9 - 10.3 mg/dL   GFR calc non Af Amer >60 >60 mL/min   GFR calc Af Amer >60 >60 mL/min    Comment: (NOTE) The eGFR has been calculated using the CKD EPI equation. This calculation has not been validated in all clinical situations. eGFR's persistently <60 mL/min signify possible Chronic Kidney Disease.    Anion gap  8 5 - 15  CBC with Differential/Platelet     Status: Abnormal   Collection Time: 04/09/16  5:27 AM  Result Value Ref Range   WBC 9.8 4.0 - 10.5 K/uL   RBC 3.88 (L) 4.22 - 5.81 MIL/uL   Hemoglobin 12.3 (L) 13.0 - 17.0 g/dL   HCT 77.5 (L) 13.8 - 87.5 %   MCV 97.9 78.0 - 100.0 fL   MCH 31.7 26.0 - 34.0 pg   MCHC 32.4 30.0 - 36.0 g/dL   RDW 28.6 (H) 50.8 - 00.4 %   Platelets 254 150 - 400 K/uL   Neutrophils Relative % 68 %   Neutro Abs 6.7 1.7 - 7.7 K/uL   Lymphocytes Relative 14 %   Lymphs Abs 1.3 0.7 - 4.0 K/uL   Monocytes Relative 14 %   Monocytes Absolute 1.4 (H) 0.1 - 1.0 K/uL   Eosinophils Relative 4 %   Eosinophils Absolute 0.4 0.0 - 0.7 K/uL   Basophils Relative 0 %   Basophils Absolute 0.0 0.0 - 0.1 K/uL  Basic metabolic panel     Status: Abnormal   Collection Time: 04/09/16  5:27 AM  Result Value Ref Range   Sodium 138 135 - 145 mmol/L   Potassium 3.8 3.5 - 5.1 mmol/L   Chloride 111 101 - 111 mmol/L   CO2 22 22 - 32 mmol/L   Glucose, Bld 101 (H) 65 - 99 mg/dL   BUN <5 (L) 6 - 20 mg/dL    Creatinine, Ser 3.82 (L) 0.61 - 1.24 mg/dL   Calcium 8.6 (L) 8.9 - 10.3 mg/dL   GFR calc non Af Amer >60 >60 mL/min   GFR calc Af Amer >60 >60 mL/min    Comment: (NOTE) The eGFR has been calculated using the CKD EPI equation. This calculation has not been validated in all clinical situations. eGFR's persistently <60 mL/min signify possible Chronic Kidney Disease.    Anion gap 5 5 - 15    Imaging / Studies: No results found.  Medications / Allergies:  Scheduled Meds: . enoxaparin (LOVENOX) injection  40 mg Subcutaneous Daily  . feeding supplement  1 Container Oral TID BM  . folic acid  1 mg Oral Daily  . multivitamin with minerals  1 tablet Oral Daily  . nicotine  21 mg Transdermal Daily  . piperacillin-tazobactam (ZOSYN)  IV  3.375 g Intravenous Q8H  . thiamine  100 mg Oral Daily   Continuous Infusions: . dextrose 5 % and 0.45 % NaCl with KCl 20 mEq/L 75 mL/hr at 04/09/16 0739   PRN Meds:.acetaminophen, albuterol, fluticasone, hydrALAZINE, LORazepam **OR** [DISCONTINUED] LORazepam, morphine injection, ondansetron **OR** ondansetron (ZOFRAN) IV  Antibiotics: Anti-infectives    Start     Dose/Rate Route Frequency Ordered Stop   04/05/16 0400  piperacillin-tazobactam (ZOSYN) IVPB 3.375 g     3.375 g 12.5 mL/hr over 240 Minutes Intravenous Every 8 hours 04/05/16 0041     04/04/16 2000  piperacillin-tazobactam (ZOSYN) IVPB 3.375 g     3.375 g 100 mL/hr over 30 Minutes Intravenous  Once 04/04/16 1951 04/04/16 2037       Assessment/Plan: 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  - likely colectomy/colostomy surgery after a few days of IV, then transitioning to PO antibiotics.  ID: Zosyn Day #5 FEN: full liquids  DVT: Lovenox, SCD's Dispo-not surgically stable. OP follow up with Dr. Romie Levee   Acute  stress response/anxiety-wife moved back to [place],  cat died, little enjoyment in work/life. Very anxious and having tremors. He plans on seeing a counselor outpatient. May need some meds short term, leave up to primary team    Erby Pian, [facility] Pager [phone]) For consults and floor pages call [phone](7A-4:30P)  04/09/2016 10:28 AM

## 2016-04-09 NOTE — Progress Notes (Signed)
Nutrition Follow-up  DOCUMENTATION CODES:   Not applicable  INTERVENTION:   -D/c Boost Breeze po TID, each supplement provides 250 kcal and 9 grams of protein -Ensure Enlive po BID, each supplement provides 350 kcal and 20 grams of protein  NUTRITION DIAGNOSIS:   Inadequate oral intake related to altered GI function (colovesical fistula) as evidenced by energy intake < 75% for > or equal to 3 months (+ current restriction to CL ).  Progressing  GOAL:   Patient will meet greater than or equal to 90% of their needs  Progressing  MONITOR:   Labs, Diet advancement, I & O's, Supplement acceptance  REASON FOR ASSESSMENT:   Malnutrition Screening Tool    ASSESSMENT:   Diverticulitis with colovesical fistula, 65 yo male with IBS, etho abuse, atrial fibrillation. Started on supportive care and IV antibiotics, follow on surgery recommendations  Spoke with pt at bedside, who was in good spirits at time of visit. He was joking with this RD, requesting a cheeseburger. Pt reports that his appetite has improved greatly since admission; he is tolerating full liquids well, noting he consumed 100% of his breakfast tray this AM. He is eager to try solid foods.   Pt reports he does not like the Boost Breeze supplements as they are too sweet. Offered Ensure supplements due to diet advancement. Pt amenable, reporting he consumes these at home.   Surgical service following for potential surgery (colostomy).   Labs reviewed.   Diet Order:  Diet full liquid Room service appropriate?: Yes; Fluid consistency:: Thin  Skin:  Reviewed, no issues  Last BM:  04/08/16  Height:   Ht Readings from Last 1 Encounters:  04/05/16 5\' 9"  (1.753 m)    Weight:   Wt Readings from Last 1 Encounters:  04/05/16 177 lb (80.287 kg)    Ideal Body Weight:  72.73 kg  BMI:  Body mass index is 26.13 kg/(m^2).  Estimated Nutritional Needs:   Kcal:  2000-2200 (25-27 kcal/kg bw)  Protein:  95-109 g pro  (1.3-1.5 g/kg ibw)  Fluid:  >2 liters  EDUCATION NEEDS:   No education needs identified at this time  Taylan Marez A. Mayford KnifeWilliams, RD, LDN, CDE Pager: [phone] After hours Pager: [phone]

## 2016-04-10 DIAGNOSIS — K5780 Diverticulitis of intestine, part unspecified, with perforation and abscess without bleeding: Secondary | ICD-10-CM

## 2016-04-10 LAB — CBC WITH DIFFERENTIAL/PLATELET
BASOS ABS: 0 10*3/uL (ref 0.0–0.1)
Basophils Relative: 0 %
Eosinophils Absolute: 0.4 10*3/uL (ref 0.0–0.7)
Eosinophils Relative: 5 %
HEMATOCRIT: 38.4 % — AB (ref 39.0–52.0)
HEMOGLOBIN: 12.3 g/dL — AB (ref 13.0–17.0)
LYMPHS PCT: 14 %
Lymphs Abs: 1.2 10*3/uL (ref 0.7–4.0)
MCH: 31.7 pg (ref 26.0–34.0)
MCHC: 32 g/dL (ref 30.0–36.0)
MCV: 99 fL (ref 78.0–100.0)
Monocytes Absolute: 1.1 10*3/uL — ABNORMAL HIGH (ref 0.1–1.0)
Monocytes Relative: 13 %
NEUTROS ABS: 6 10*3/uL (ref 1.7–7.7)
Neutrophils Relative %: 68 %
Platelets: 267 10*3/uL (ref 150–400)
RBC: 3.88 MIL/uL — AB (ref 4.22–5.81)
RDW: 16.1 % — ABNORMAL HIGH (ref 11.5–15.5)
WBC: 8.7 10*3/uL (ref 4.0–10.5)

## 2016-04-10 MED ORDER — SODIUM CHLORIDE 0.9% FLUSH
3.0000 mL | Freq: Two times a day (BID) | INTRAVENOUS | Status: DC
  Administered 2016-04-10 – 2016-04-11 (×3): 3 mL via INTRAVENOUS

## 2016-04-10 MED ORDER — SODIUM CHLORIDE 0.9% FLUSH: 3.0000 mL | INTRAVENOUS | Status: DC | PRN

## 2016-04-10 MED ORDER — METRONIDAZOLE 500 MG PO TABS
500.0000 mg | ORAL_TABLET | Freq: Three times a day (TID) | ORAL | Status: DC
  Administered 2016-04-10 – 2016-04-11 (×4): 500 mg via ORAL
  Filled 2016-04-10 (×4): qty 1

## 2016-04-10 MED ORDER — CIPROFLOXACIN HCL 500 MG PO TABS
500.0000 mg | ORAL_TABLET | Freq: Two times a day (BID) | ORAL | Status: DC
  Administered 2016-04-10 – 2016-04-11 (×3): 500 mg via ORAL
  Filled 2016-04-10 (×3): qty 1

## 2016-04-10 NOTE — Progress Notes (Signed)
PROGRESS NOTE    Dene Nazir  ZOX:[id number] DOB: [birth year] DOA: 04/04/2016 PCP: Neena Rhymes, MD (Confirm with patient/family/NH records and if not entered, this HAS to be entered at [facility] point of entry. "No PCP" if truly none.)   Brief Narrative: (Start on day 1 of progress note - keep it brief and live) Diverticulitis with colovesical fistula, 65 yo male with IBS, etho abuse, atrial fibrillation. Started on supportive care and IV antibiotics, diet advanced.   Assessment & Plan:   Principal Problem:   Diverticulitis Active Problems:   HTN (hypertension)   Alcohol abuse   Colovesical fistula   Hypoxia   Atrial fibrillation (HCC)  1. Cardiovascular.Patient tolerating po well, will hold on IV fluids, continue hemodyanamic stable, antibiotics change to po metronidazole and cipor per surgery.    2. Pulmonary. No signs of volume overload or aspiration, will continue to monitor oxymetry and give supplemental 0-2 per [place] to target 02 sat above 92%.  3. Nephrology. Patient tolerating po well, will follow on renal panel in am, will hold on IV fluids.   4, GI. Diet has been advanced and foley has been removed, will continue with oral antibiotics, and supportive care, follow on surgery recommendations.   5. Neurology.  Clinically patient more calm with benzodiazepines, tolerating well clonazepam and lorazepam.   6. dvt px    DVT prophylaxis: (Lovenox/Heparin/SCD's/anticoagulated/None (if comfort care) Code Status: (Full/Partial - specify details) Family Communication: (Specify name, relationship & date discussed. NO "discussed with patient") Disposition Plan: (specify when and where you expect patient to be discharged). Include barriers to DC in this tab.   Consultants:   surgery  Procedures: (Don't include imaging studies which can be auto populated. Include things that cannot be auto populated i.e. Echo, Carotid and venous dopplers, Foley, Bipap, HD, tubes/drains, wound vac,  central lines etc)    Antimicrobials: (specify start and planned stop date. Auto populated tables are space occupying and do not give end dates)  Metronidazole  ciprofloxacin   Subjective: Patient more calm today, improved abdominal pain after removing foley catheter, no nausea or vomiting, no fever or chills, diet has been advanced.   Objective: Filed Vitals:   04/09/16 1418 04/09/16 2135 04/10/16 0500 04/10/16 1321  BP: 168/84 179/79 162/85 160/76  Pulse: 79 73 79 78  Temp: 98.6 F (37 C) 98.3 F (36.8 C) 98.6 F (37 C) 98 F (36.7 C)  TempSrc: Oral     Resp: Height:      Weight:      SpO2: 97% 94% 96% 93%    Intake/Output Summary (Last 24 hours) at 04/10/16 1517 Last data filed at 04/10/16 1300  Gross per 24 hour  Intake 3011.25 ml  Output   5025 ml  Net -2013.75 ml   Filed Weights   04/05/16 1255  Weight: 80.287 kg (177 lb)    Examination:  General exam: not in pain or dyspnea E ENT: oral mucosa moist, conjunctiva with no pallor.  Respiratory system: Clear to auscultation. Respiratory effort normal. No rales, rhonchi or wheezing. Cardiovascular system: S1 & S2 heard, RRR. No JVD, murmurs, rubs, gallops or clicks. No pedal edema. Gastrointestinal system: Abdomen is nondistended, soft, mild tender to deep palpation at the lower quadrants. No organomegaly or masses felt. Normal bowel sounds heard. Central nervous system: Alert and oriented. No focal neurological deficits. Extremities: Symmetric 5 x 5 power. Skin: No rashes, lesions or ulcers Psychiatry: Judgement and insight appear normal. Mood & affect  appropriate.     Data Reviewed: I have personally reviewed following labs and imaging studies  CBC:  Recent Labs Lab 04/06/16 0543 04/07/16 0542 04/08/16 0546 04/09/16 0527 04/10/16 0756  WBC 9.1 6.9 8.5 9.8 8.7  NEUTROABS  --   --  6.3 6.7 6.0  HGB 11.4* 11.3* 12.3* 12.3* 12.3*  HCT 35.2* 35.0* 37.6* 38.0* 38.4*  MCV 97.5 98.9 98.7  97.9 99.0  PLT 215 219 221 254 267   Basic Metabolic Panel:  Recent Labs Lab 04/05/16 0533 04/06/16 0543 04/07/16 0542 04/08/16 0546 04/09/16 0527  NA 134* 133* 136 136 138  K 3.7 3.7 3.3* 3.5 3.8  CL 105 103 105 107 111  CO2 20* 23 23 21* 22  GLUCOSE 85 104* 94 113* 101*  BUN 6 <5* <5* <5* <5*  CREATININE 0.82 0.73 0.76 0.56* 0.60*  CALCIUM 8.1* 8.2* 8.4* 8.3* 8.6*  MG  --   --  1.7  --   --   PHOS  --   --  2.7  --   --    GFR: Estimated Creatinine Clearance: 94.5 mL/min (by C-G formula based on Cr of 0.6). Liver Function Tests:  Recent Labs Lab 04/04/16 1809 04/05/16 0533 04/06/16 0543 04/07/16 0542  AST 63* 39 34 38  ALT 36 24 22 24   ALKPHOS 95 73 66 66  BILITOT 1.5* 1.0 0.9 0.8  PROT 7.6 5.8* 5.7* 5.6*  ALBUMIN 3.2* 2.5* 2.3* 2.2*    Recent Labs Lab 04/04/16 1809  LIPASE 41   No results for input(s): AMMONIA in the last 168 hours. Coagulation Profile:  Recent Labs Lab 04/05/16 0135  INR 1.17   Cardiac Enzymes: No results for input(s): CKTOTAL, CKMB, CKMBINDEX, TROPONINI in the last 168 hours. BNP (last 3 results) No results for input(s): PROBNP in the last 8760 hours. HbA1C: No results for input(s): HGBA1C in the last 72 hours. CBG: No results for input(s): GLUCAP in the last 168 hours. Lipid Profile: No results for input(s): CHOL, HDL, LDLCALC, TRIG, CHOLHDL, LDLDIRECT in the last 72 hours. Thyroid Function Tests: No results for input(s): TSH, T4TOTAL, FREET4, T3FREE, THYROIDAB in the last 72 hours. Anemia Panel: No results for input(s): VITAMINB12, FOLATE, FERRITIN, TIBC, IRON, RETICCTPCT in the last 72 hours. Sepsis Labs:  Recent Labs Lab 04/04/16 2018 04/04/16 2310 04/05/16 0143  LATICACIDVEN 3.37* 1.08 1.09    Recent Results (from the past 240 hour(s))  Culture, blood (Routine X 2) w Reflex to ID Panel     Status: None   Collection Time: 04/04/16  8:02 PM  Result Value Ref Range Status   Specimen Description BLOOD LEFT HAND   Final   Special Requests BOTTLES DRAWN AEROBIC AND ANAEROBIC 5CC  Final   Culture NO GROWTH 5 DAYS  Final   Report Status 04/09/2016 FINAL  Final  Culture, blood (Routine X 2) w Reflex to ID Panel     Status: None   Collection Time: 04/04/16  8:03 PM  Result Value Ref Range Status   Specimen Description BLOOD RIGHT HAND  Final   Special Requests BOTTLES DRAWN AEROBIC ONLY 4CC  Final   Culture NO GROWTH 5 DAYS  Final   Report Status 04/09/2016 FINAL  Final  Urine culture     Status: Abnormal   Collection Time: 04/04/16  9:29 PM  Result Value Ref Range Status   Specimen Description URINE, RANDOM  Final   Special Requests NONE  Final   Culture <10,000 COLONIES/mL INSIGNIFICANT GROWTH (A)  Final   Report Status 04/06/2016 FINAL  Final  C difficile quick scan w PCR reflex     Status: None   Collection Time: 04/06/16  9:53 AM  Result Value Ref Range Status   C Diff antigen NEGATIVE NEGATIVE Final   C Diff toxin NEGATIVE NEGATIVE Final   C Diff interpretation Negative for toxigenic C. difficile  Final         Radiology Studies: No results found.      Scheduled Meds: . ciprofloxacin  500 mg Oral BID  . clonazePAM  1 mg Oral BID  . enoxaparin (LOVENOX) injection  40 mg Subcutaneous Daily  . feeding supplement (ENSURE ENLIVE)  237 mL Oral BID BM  . folic acid  1 mg Oral Daily  . metroNIDAZOLE  500 mg Oral TID  . multivitamin with minerals  1 tablet Oral Daily  . nicotine  21 mg Transdermal Daily  . sodium chloride flush  3 mL Intravenous Q12H  . thiamine  100 mg Oral Daily   Continuous Infusions:    LOS: 5 days       Seleena Reimers Annett Gulaaniel Amariah Kierstead, MD [facility] Pager [phone]  If 7PM-7AM, please contact night-coverage [email or web address] Password [email or web address] 04/10/2016, 3:17 PM

## 2016-04-10 NOTE — Progress Notes (Signed)
[facility] Progress Note    Subjective: Patient doing well with minimal complaints except he wants to have the Foley out.  Objective: Vital signs in last 24 hours: Temp:  [98.3 F (36.8 C)-98.6 F (37 C)] 98.6 F (37 C) (06/14 0500) Pulse Rate:  [73-79] 79 (06/14 0500) Resp:  [14-20] 14 (06/14 0500) BP: (162-179)/(79-85) 162/85 mmHg (06/14 0500) SpO2:  [94 %-97 %] 96 % (06/14 0500) Last BM Date: 04/09/16  Intake/Output from previous day: 06/13 0701 - 06/14 0700 In: 1202 [P.O.:1202] Out: 5700 [Urine:5700] Intake/Output this shift:    General: No distress.  Lungs: Clear   Abd: Soft, excellent bowel sounds.  Extremities: No changes  Neuro: A bit anxious.  Lab Results:  @LABLAST2 (wbc:2,hgb:2,hct:2,plt:2) BMET ) Recent Labs  04/08/16 0546 04/09/16 0527  NA 136 138  K 3.5 3.8  CL 107 111  CO2 21* 22  GLUCOSE 113* 101*  BUN <5* <5*  CREATININE 0.56* 0.60*  CALCIUM 8.3* 8.6*   PT/INR No results for input(s): LABPROT, INR in the last 72 hours. ABG No results for input(s): PHART, HCO3 in the last 72 hours.  Invalid input(s): PCO2, PO2  Studies/Results: No results found.  Anti-infectives: Anti-infectives    Start     Dose/Rate Route Frequency Ordered Stop   04/05/16 0400  piperacillin-tazobactam (ZOSYN) IVPB 3.375 g     3.375 g 12.5 mL/hr over 240 Minutes Intravenous Every 8 hours 04/05/16 0041     04/04/16 2000  piperacillin-tazobactam (ZOSYN) IVPB 3.375 g     3.375 g 100 mL/hr over 30 Minutes Intravenous  Once 04/04/16 1951 04/04/16 2037      Assessment/Plan: s/p  d/c foley Advance diet  LOS: 5 days   Marta LamasJames O. Gae BonWyatt, III, MD, FACS [phone]--pager [phone]--office [facility] 04/10/2016

## 2016-04-11 DIAGNOSIS — K5700 Diverticulitis of small intestine with perforation and abscess without bleeding: Secondary | ICD-10-CM

## 2016-04-11 DIAGNOSIS — I480 Paroxysmal atrial fibrillation: Secondary | ICD-10-CM

## 2016-04-11 LAB — CBC WITH DIFFERENTIAL/PLATELET
BASOS ABS: 0 10*3/uL (ref 0.0–0.1)
BASOS PCT: 0 %
Eosinophils Absolute: 0.3 10*3/uL (ref 0.0–0.7)
Eosinophils Relative: 4 %
HEMATOCRIT: 41.8 % (ref 39.0–52.0)
HEMOGLOBIN: 13.3 g/dL (ref 13.0–17.0)
LYMPHS ABS: 1.4 10*3/uL (ref 0.7–4.0)
LYMPHS PCT: 17 %
MCH: 31.4 pg (ref 26.0–34.0)
MCHC: 31.8 g/dL (ref 30.0–36.0)
MCV: 98.8 fL (ref 78.0–100.0)
MONO ABS: 0.9 10*3/uL (ref 0.1–1.0)
MONOS PCT: 11 %
Neutro Abs: 5.4 10*3/uL (ref 1.7–7.7)
Neutrophils Relative %: 68 %
Platelets: 343 10*3/uL (ref 150–400)
RBC: 4.23 MIL/uL (ref 4.22–5.81)
RDW: 15.8 % — AB (ref 11.5–15.5)
WBC: 8.1 10*3/uL (ref 4.0–10.5)

## 2016-04-11 LAB — BASIC METABOLIC PANEL
Anion gap: 11 (ref 5–15)
CHLORIDE: 107 mmol/L (ref 101–111)
CO2: 16 mmol/L — AB (ref 22–32)
CREATININE: 0.68 mg/dL (ref 0.61–1.24)
Calcium: 8.9 mg/dL (ref 8.9–10.3)
GFR calc Af Amer: >60 mL/min (ref >60)
GFR calc non Af Amer: >60 mL/min (ref >60)
GLUCOSE: 87 mg/dL (ref 65–99)
POTASSIUM: 5.6 mmol/L — AB (ref 3.5–5.1)
SODIUM: 134 mmol/L — AB (ref 135–145)

## 2016-04-11 LAB — POTASSIUM: Potassium: 4 mmol/L (ref 3.5–5.1)

## 2016-04-11 MED ORDER — METRONIDAZOLE 500 MG PO TABS: 500.0000 mg | ORAL_TABLET | Freq: Three times a day (TID) | ORAL | Status: DC

## 2016-04-11 MED ORDER — ASPIRIN EC 81 MG PO TBEC: 81.0000 mg | DELAYED_RELEASE_TABLET | Freq: Every day | ORAL | Status: DC

## 2016-04-11 MED ORDER — LORAZEPAM 1 MG PO TABS: 1.0000 mg | ORAL_TABLET | Freq: Four times a day (QID) | ORAL | Status: DC | PRN

## 2016-04-11 MED ORDER — CIPROFLOXACIN HCL 500 MG PO TABS: 500.0000 mg | ORAL_TABLET | Freq: Two times a day (BID) | ORAL | Status: DC

## 2016-04-11 NOTE — Discharge Summary (Signed)
Alvin Benton, is a 65 y.o. male  DOB 1951-07-01  MRN [id number].  Admission date:  04/04/2016  Admitting Physician  Clydie Braun, MD  Discharge Date:  04/11/2016   Primary MD  Neena Rhymes, MD  Recommendations for primary care physician for things to follow:   Patient is discharged home, he will need to continue 2 more weeks of antibiotic therapy with metronidazole and ciprofloxacin. Follow up as an outpatient with surgical team and primary care physician.   Admission Diagnosis  Tachypnea [R06.82] Diverticulitis of intestine without perforation or abscess without bleeding [K57.92]   Discharge Diagnosis  Tachypnea [R06.82] Diverticulitis of intestine without perforation or abscess without bleeding [K57.92]    Principal Problem:   Diverticulitis Active Problems:   HTN (hypertension)   Alcohol abuse   Colovesical fistula   Hypoxia   Atrial fibrillation [facility])      Past Medical History  Diagnosis Date  . Blood in stool   . IBS (irritable bowel syndrome)   . Diverticulitis   . Hypertension   . Migraine     "none in a long time" (03/14/2016)    Past Surgical History  Procedure Laterality Date  . Colonoscopy    . Tonsillectomy         HPI  from the history and physical done on the day of admission:   This is a 65 year old male who presents to the hospital with the chief complaint of abdominal pain, nausea and vomiting. Patient had progressive lower quadrant abdominal pain, colicky in nature, moderate to severe intensity, associated with chills and dizziness. In May 18 to May 22 he had sigmoid diverticulitis with abscess and colovesical fistula. On his initial physical examination he was afebrile, tachycardic at 153, tachypnea 30 breaths per minute, his oxygen saturation was 84% his oral mucosa was dry, his heart was irregularly irregular, trace lower extremity edema, his abdomen was  tender to palpation in lower abdomen, no rebound or any peritoneal sign. He was noted to be very anxious. His serum sodium was 140, potassium 4.4, chloride 103, creatinine 1.15, his white count was 15.0, hemoglobin 14.2. Abdominal pelvic CT from June 5,  showed findings consistent with acute colitis or diverticulitis with changes suggestive of probably a fistulous tract connecting to a pericolonic abscess and changes suggestive of fistulous communication between the sigmoid colon and urinary bladder. Follow CT abdomen pelvis from admission showed persistent changes with interval slight decrease in the air-fluid collection adjacent to the colon and persistent ear within the urinary bladder suggestive persistent colovesical fistula.  Patient was admitted to hospital with the working diagnosis of colitis/ diverticulitis with colovesical fistula acute on chronic complicated by sepsis.       Hospital Course:    1. Cardiovascular. On admission patient had atrial fibrillation, rate was controlled with diltiazem, patient converted to sinus rhythm. Patient has a low risk for thrombotic event, his Chads Vasc score is 1. Patient will be started on aspirin and will need close follow-up as an outpatient.  2. Pulmonary. Patient  tolerated well IV fluids, no signs of volume overload or pulmonary infection. Patient had oximetry monitor and supplemental oxygen as needed.  3. Nephrology. Kidney function remained stable at 0.5-0.6. His electrolytes were monitored closely. Patient was transitioned to oral hydration with no major complications.  4. Gastrointestinal. Patient was treated with supportive care, IV fluids, pain control and  IV antibiotic with Zosyn. Posteriorly he was transitioned to oral metronidazole and ciprofloxacin that he'll continue for 2 more weeks. His diet was advanced slowly and progressively to the point where he is having no more nausea or vomiting. He had a Foley catheter in place that has been  removed 24 hours before discharge. Surgery team was consulted and will follow-up as an outpatient  5. Neurology. Patient had significant anxiety. Patient was treated with benzodiazepines with good outcome.    Discharge Condition: Stable  Follow UP  Follow-up Information    Follow up with Neena Rhymes, MD In 1 week.   Specialty:  Family Medicine   Contact information:   [address] [place] [address] [phone]        Consults obtained - Surgery  Diet and Activity recommendation: See Discharge Instructions below  Discharge Instructions     Discharge Instructions    Diet - low sodium heart healthy    Complete by:  As directed      Discharge instructions    Complete by:  As directed   Please take antibiotics for the next 2 weeks     Increase activity slowly    Complete by:  As directed              Discharge Medications       Medication List    STOP taking these medications        amoxicillin-clavulanate 875-125 MG tablet  Commonly known as:  AUGMENTIN     HYDROcodone-acetaminophen 5-325 MG tablet  Commonly known as:  NORCO      TAKE these medications        albuterol 108 (90 Base) MCG/ACT inhaler  Commonly known as:  PROVENTIL HFA;VENTOLIN HFA  Inhale 2 puffs into the lungs every 6 (six) hours as needed for wheezing or shortness of breath.     ciprofloxacin 500 MG tablet  Commonly known as:  CIPRO  Take 1 tablet (500 mg total) by mouth 2 (two) times daily.     folic acid 1 MG tablet  Commonly known as:  FOLVITE  Take 1 tablet (1 mg total) by mouth daily.     metroNIDAZOLE 500 MG tablet  Commonly known as:  FLAGYL  Take 1 tablet (500 mg total) by mouth 3 (three) times daily.     mometasone 50 MCG/ACT nasal spray  Commonly known as:  NASONEX  Place 2 sprays into the nose daily.     multivitamin with minerals Tabs tablet  Take 1 tablet by mouth once a week.     valsartan 160 MG tablet  Commonly known as:  DIOVAN  TAKE 1  TABLET (160 MG TOTAL) BY MOUTH DAILY.        Major procedures and Radiology Reports - PLEASE review detailed and final reports for all details, in brief -      Dg Chest 2 View  04/05/2016  CLINICAL DATA:  Cough and tachycardia EXAM: CHEST  2 VIEW COMPARISON:  April 04, 2016 FINDINGS: There is interstitial pulmonary edema with borderline cardiomegaly and pulmonary venous hypertension. There is mild airspace opacity in the medial  right base region. There is no demonstrable adenopathy. No bone lesions. IMPRESSION: Evidence of a degree of congestive heart failure. Probable alveolar edema in the medial right base, although a small focus of pneumonia cannot be excluded radiographically in this region. Electronically Signed   By: Bretta Bang III M.D.   On: 04/05/2016 07:30   Ct Abdomen Pelvis W Contrast  04/04/2016  CLINICAL DATA:  Diverticulitis with persistent nausea and vomiting EXAM: CT ABDOMEN AND PELVIS WITH CONTRAST TECHNIQUE: Multidetector CT imaging of the abdomen and pelvis was performed using the standard protocol following bolus administration of intravenous contrast. CONTRAST:  ISOVUE-300 IOPAMIDOL (ISOVUE-300) INJECTION 61% COMPARISON:  04/01/2016 FINDINGS: The lung bases are free of acute infiltrate or sizable effusion. The liver demonstrates fatty infiltration. The spleen, adrenal glands, pancreas and gallbladder are all normal in their CT appearance. Kidneys again demonstrate a normal enhancement pattern. Stomach is well distended with contrast material. The visualized small bowel is within normal limits. The appendix is within normal limits. No inflammatory changes are seen. There remain diverticular change of the colon as well as significant length of sigmoid colon with circumferential wall thickening. No obstructive changes are seen. A tiny air-fluid collection is again identified adjacent to colon best seen on image number 78 of series 2. This measures 16 mm in greatest dimension  and is again decreased from the previous exam. The bladder is partially distended and again demonstrates some air within. The possibility of a colovesical fistula would deserve consideration. Inflammatory changes are also noted extending towards adjacent small bowel loops similar to that seen on the prior exam. No free pelvic fluid is noted.  No acute bony abnormality is seen. IMPRESSION: Persistent changes of diverticulitis/ colitis in the sigmoid colon. Interval slight decrease in the air-fluid collection adjacent to colon is noted. Additionally some inflammatory change is again identified extending towards adjacent loops of small bowel similar to that noted on the prior exam. There is some thickening of the bladder wall with air within suspicious of colovesical fistula. Electronically Signed   By: Alcide Clever M.D.   On: 04/04/2016 22:57   Ct Abdomen Pelvis W Contrast  04/01/2016  CLINICAL DATA:  Follow-up diverticulitis and abscess EXAM: CT ABDOMEN AND PELVIS WITH CONTRAST TECHNIQUE: Multidetector CT imaging of the abdomen and pelvis was performed using the standard protocol following bolus administration of intravenous contrast. CONTRAST:  ISOVUE-300 IOPAMIDOL (ISOVUE-300) INJECTION 61% COMPARISON:  03/14/2016 FINDINGS: Lower chest: Small hiatal hernia. Images of the lung bases are unremarkable. Hepatobiliary: There is fatty infiltration of the liver. No focal hepatic mass. No calcified gallstones are noted within gallbladder. Pancreas: No focal pancreatic mass.  No peripancreatic fluid. Spleen: Enhanced spleen is unremarkable. Adrenals/Urinary Tract: No adrenal gland mass. Enhanced kidneys are unremarkable. No hydronephrosis or hydroureter. Delayed renal images shows bilateral renal symmetrical excretion. Bilateral visualized proximal ureter is unremarkable. Stomach/Bowel: There is no gastric outlet obstruction. No small bowel obstruction. No pericecal inflammation. Normal appendix is noted in axial  image 58. Again noted segmental abnormal thickening of sigmoid colon wall axial image 66 about 13 cm. Findings consistent with acute colitis or diverticulitis. There is tiny amount of extraluminal air axial image 68 just inferior to sigmoid colon and probable a fistulous tract connecting with a pericolonic abscess. Measures 2.1 by 1.7 cm decreased in size from prior exam. Again noted air within the urinary bladder. There is thickening of upper wall of urinary bladder. Findings highly suspicious for a fistulous communication between sigmoid colon and  urinary bladder please see coronal image 47. There is a second tubular collection anterior left superior aspect above urinary bladder with enhancing wall. There is stranding of adjacent mesenteric fat Please see axial image 63. This is in close proximity with small bowel loops. I cannot exclude fistulous communication with adjacent small bowel. There is no evidence of extraluminal oral contrast material. Limited assessment of the colon which is not opacified with contrast material. No distal colonic obstruction. Vascular/Lymphatic: Atherosclerotic calcifications of abdominal aorta and iliac arteries. No aortic aneurysm. Reproductive: Mild enlarged prostate gland measures 5.1 by 4.5 cm. Seminal vesicles are unremarkable. Other: There is no evidence of free abdominal air. No abdominal ascites. Musculoskeletal: Sagittal images of the spine shows degenerative changes thoracolumbar spine. No destructive bony lesions are noted. IMPRESSION: 1. Again noted segmental abnormal thickening of sigmoid colon wall axial image 66 about 13 cm. Findings consistent with acute colitis or diverticulitis. There is tiny amount of extraluminal air axial image 68 just inferior to sigmoid colon and probable a fistulous tract connecting with a pericolonic abscess. Measures 2.1 by 1.7 cm decreased in size from prior exam. Again noted air within the urinary bladder. There is thickening of upper wall  of urinary bladder. Findings highly suspicious for a fistulous communication between sigmoid colon and urinary bladder please see coronal image 47. There is a second tubular collection anterior left superior aspect above urinary bladder with enhancing wall. There is stranding of adjacent mesenteric fat Please see axial image 63. This is in close proximity with small bowel loops. I cannot exclude fistulous communication with adjacent small bowel. 2. No hydronephrosis or hydroureter. 3. Fatty infiltration of the liver. 4. Mild enlarged prostate gland. 5. Degenerative changes thoracolumbar spine. Electronically Signed   By: Natasha MeadLiviu  Pop M.D.   On: 04/01/2016 09:16   Ct Abdomen Pelvis W Contrast  03/14/2016  CLINICAL DATA:  Lower abdominal pain with constipation for 3 days. Recent history of diverticulitis. EXAM: CT ABDOMEN AND PELVIS WITH CONTRAST TECHNIQUE: Multidetector CT imaging of the abdomen and pelvis was performed using the standard protocol following bolus administration of intravenous contrast. CONTRAST:  100mL ISOVUE-300 IOPAMIDOL (ISOVUE-300) INJECTION 61% COMPARISON:  None. FINDINGS: Lower chest:  No acute findings. Hepatobiliary: Liver is diffusely low in density indicating fatty infiltration. Gallbladder is unremarkable. No bile duct dilatation. Pancreas: No mass, inflammatory changes, or other significant abnormality. Spleen: Within normal limits in size and appearance. Adrenals/Urinary Tract: Adrenal glands appear normal. Kidneys are unremarkable without stone or hydronephrosis. No ureteral or bladder calculi identified. Stomach/Bowel: There is marked thickening of the walls of the mid/ upper sigmoid colon, with patulous configuration, most likely sequela of an acute diverticulitis as there is fairly prominent diverticulosis within the adjacent upper sigmoid colon. There is a complex collection of fluid and air posterior to the sigmoid colon, with associated air-fluid levels, measuring 3.9 x 2.3 x 3.7  cm, consistent with abscess. I suspect additional smaller abscess collections above the sigmoid colon. I also suspect small fistulous connections above the sigmoid colon, between the sigmoid colon and surrounding mesentery and/or adjacent small bowel. There is certainly evidence of a fistula between the sigmoid colon and the underlying bladder (prominent bladder wall thickening and air within the bladder). No evidence of bowel obstruction. No free intraperitoneal air within the upper abdomen. Vascular/Lymphatic: Scattered atherosclerotic changes of the normal- caliber abdominal aorta. No acute - appearing vascular abnormality seen. Reproductive: Prostate gland is mildly enlarged causing slight mass effect on the bladder base. Otherwise unremarkable. Other:  None. Musculoskeletal: Degenerative changes within the thoracolumbar spine, the fairly mild in degree. No acute or suspicious osseous lesion identified. IMPRESSION: 1. Marked thickening of the walls of the mid/upper sigmoid colon, involving approximately 14 cm segment of the sigmoid colon, most likely related to an acute diverticulitis as there is diverticulosis seen within the adjacent upper sigmoid colon. 2. Abscess collection posterior to the sigmoid colon measuring 3.9 x 2.3 x 3.7 cm. 3. Probable additional smaller abscess collections above the sigmoid colon. 4. Findings highly suspicious for a fistula between the sigmoid colon and the underlying bladder. There may be additional fistula formation within the soft tissues above the sigmoid colon (cannot exclude developing fistulous connections to overlying small bowel). Recommend immediate surgical consultation. These results were called by telephone at the time of interpretation on 03/14/2016 at 4:55 pm to Dr. Elizabeth Sauer , who verbally acknowledged these results. Electronically Signed   By: Bary Richard M.D.   On: 03/14/2016 16:59   Dg Chest Port 1 View  04/04/2016  CLINICAL DATA:  Shortness of breath and  hypoxia EXAM: PORTABLE CHEST 1 VIEW COMPARISON:  None. FINDINGS: Cardiac shadow is within normal limits. The lungs are clear bilaterally. No acute bony abnormality is seen. IMPRESSION: No acute abnormality noted. Electronically Signed   By: Alcide Clever M.D.   On: 04/04/2016 23:23    Micro Results    Recent Results (from the past 240 hour(s))  Culture, blood (Routine X 2) w Reflex to ID Panel     Status: None   Collection Time: 04/04/16  8:02 PM  Result Value Ref Range Status   Specimen Description BLOOD LEFT HAND  Final   Special Requests BOTTLES DRAWN AEROBIC AND ANAEROBIC 5CC  Final   Culture NO GROWTH 5 DAYS  Final   Report Status 04/09/2016 FINAL  Final  Culture, blood (Routine X 2) w Reflex to ID Panel     Status: None   Collection Time: 04/04/16  8:03 PM  Result Value Ref Range Status   Specimen Description BLOOD RIGHT HAND  Final   Special Requests BOTTLES DRAWN AEROBIC ONLY 4CC  Final   Culture NO GROWTH 5 DAYS  Final   Report Status 04/09/2016 FINAL  Final  Urine culture     Status: Abnormal   Collection Time: 04/04/16  9:29 PM  Result Value Ref Range Status   Specimen Description URINE, RANDOM  Final   Special Requests NONE  Final   Culture <10,000 COLONIES/mL INSIGNIFICANT GROWTH (A)  Final   Report Status 04/06/2016 FINAL  Final  C difficile quick scan w PCR reflex     Status: None   Collection Time: 04/06/16  9:53 AM  Result Value Ref Range Status   C Diff antigen NEGATIVE NEGATIVE Final   C Diff toxin NEGATIVE NEGATIVE Final   C Diff interpretation Negative for toxigenic C. difficile  Final       Today   Subjective    Alvin Benton. Patient is feeling back to his baseline, his abdominal pain has improved remarkably, he is able to tolerate aby mouth diet adequately. He has no problems voiding urine.   Objective   Blood pressure 159/73, pulse 62, temperature 98.3 F (36.8 C), temperature source Oral, resp. rate 18, height 5\' 9"  (1.753 m), weight 80.287 kg  (177 lb), SpO2 91 %.   Intake/Output Summary (Last 24 hours) at 04/11/16 1221 Last data filed at 04/11/16 1119  Gross per 24 hour  Intake    720 ml  Output    500 ml  Net    220 ml    Exam Gen. Awake and alert Oral mucosa moist Neck supple Lungs are clear to auscultation bilaterally, no wheezing, rales or rhonchi Heart S1-S2 present and rhythmic no gallops or rubs Abdomen soft nontender Lower extremities no edema Neurologically nonfocal   Data Review   CBC w Diff: Lab Results  Component Value Date   WBC 8.1 04/11/2016   HGB 13.3 04/11/2016   HCT 41.8 04/11/2016   PLT 343 04/11/2016   LYMPHOPCT 17 04/11/2016   MONOPCT 11 04/11/2016   EOSPCT 4 04/11/2016   BASOPCT 0 04/11/2016    CMP: Lab Results  Component Value Date   NA 134* 04/11/2016   K 4.0 04/11/2016   CL 107 04/11/2016   CO2 16* 04/11/2016   BUN <5* 04/11/2016   CREATININE 0.68 04/11/2016   PROT 5.6* 04/07/2016   ALBUMIN 2.2* 04/07/2016   BILITOT 0.8 04/07/2016   ALKPHOS 66 04/07/2016   AST 38 04/07/2016   ALT 24 04/07/2016  .   Total Time in preparing paper work, data evaluation and todays exam - 45 minutes  Coralie Keens M.D on 04/11/2016 at 12:21 PM  [facility]   Office  [phone]

## 2016-04-19 ENCOUNTER — Ambulatory Visit (INDEPENDENT_AMBULATORY_CARE_PROVIDER_SITE_OTHER): Payer: Managed Care, Other (non HMO) | Admitting: Family Medicine

## 2016-04-19 ENCOUNTER — Encounter: Payer: Self-pay | Admitting: Family Medicine

## 2016-04-19 VITALS — BP 140/86 | HR 96 | Temp 98.1°F | Resp 16 | Ht 69.0 in | Wt 169.5 lb

## 2016-04-19 DIAGNOSIS — K5720 Diverticulitis of large intestine with perforation and abscess without bleeding: Secondary | ICD-10-CM

## 2016-04-19 DIAGNOSIS — I480 Paroxysmal atrial fibrillation: Secondary | ICD-10-CM

## 2016-04-19 DIAGNOSIS — F411 Generalized anxiety disorder: Secondary | ICD-10-CM | POA: Diagnosis not present

## 2016-04-19 MED ORDER — LORAZEPAM 1 MG PO TABS: 1.0000 mg | ORAL_TABLET | Freq: Every day | ORAL | Status: DC

## 2016-04-19 NOTE — Patient Instructions (Signed)
Follow up in 1 month to recheck BP Finish the Cipro and Flagyl as directed Continue to take a once daily aspirin Use the Lorazepam once daily for anxiety If you develop palpitations, chest pain, or other concerns- please call or go to the ER now that you have an episode of Afib Call with any questions or concerns Hang in there!!!

## 2016-04-19 NOTE — Progress Notes (Signed)
Pre visit review using our clinic review tool, if applicable. No additional management support is needed unless otherwise documented below in the visit note. 

## 2016-04-19 NOTE — Assessment & Plan Note (Signed)
Chronic problem.  Pt was given Lorazepam while hospitalized and he reports 'it was a miracle drug' and dramatically lessened his anxiety.  Asking for a prescription to take daily-'just 1 Lorazepam daily'.  Pt swears he is no longer drinking- he was cautioned against the interaction between benzos and ETOH.  Script provided.  Will follow.

## 2016-04-19 NOTE — Progress Notes (Signed)
   Subjective:    Patient ID: Alvin Benton, male    DOB: 19-Mar-1951, 65 y.o.   MRN: [id number]  HPI Hospital f/u- pt was admitted 6/8-6/15 w/ recurrent diverticulitis, colovesical fistula.  He had Afib while hospitalized that converted to sinus on Dilt.  Pt was started on ASA 81mg  w/ ChadsVasc score of 1.  Denies palpitations today- no CP, SOB. Pt was instructed to complete 2 weeks of Cipro/Flagyl at the time of d/c.  Pt reports feeling better today.  States he is eating well.  abd pain is 'gone'.   [facility] but was d/c'd w/o f/u.  Pt reports that while he was hospitalized he was given 'a miracle drug- lorazepam'.  Pt reports 'this made all the difference in the world.  The shaking went away, the anxiety went away'.  Pt reports he is no longer drinking ETOH.  Pt is having anxiety attack in office today- shaking, twitching, difficulty focusing.     Review of Systems For ROS see HPI     Objective:   Physical Exam  Constitutional: He is oriented to person, place, and time. He appears well-developed and well-nourished. No distress.  HENT:  Head: Normocephalic and atraumatic.  Eyes: Conjunctivae and EOM are normal. Pupils are equal, round, and reactive to light.  Neck: Normal range of motion. Neck supple. No thyromegaly present.  Cardiovascular: Normal rate, regular rhythm, normal heart sounds and intact distal pulses.   No murmur heard. Pulmonary/Chest: Effort normal and breath sounds normal. No respiratory distress.  Abdominal: Soft. Bowel sounds are normal. He exhibits no distension.  Musculoskeletal: He exhibits no edema.  Lymphadenopathy:    He has no cervical adenopathy.  Neurological: He is alert and oriented to person, place, and time. No cranial nerve deficit.  Tremulous  Skin: Skin is warm and dry.  Psychiatric: He has a normal mood and affect. His behavior is normal.  Vitals reviewed.         Assessment & Plan:

## 2016-04-19 NOTE — Assessment & Plan Note (Signed)
New to provider.  Pt had isolated episode of Afib while hospitalized.  This converted to sinus w/ Dilt and pt is rate controlled today.  On ASA for ChadsVasc score of 1.  Remains in sinus today.  No changes.

## 2016-04-19 NOTE — Assessment & Plan Note (Signed)
Pt reports feeling much better since his recent hospitalization.  Denies abd pain, N/V, diarrhea.  Pt is supposed to have surgical f/u but he does not have an appt.  After calling [facility] while pt was in office today, he now has an appt w/ Dr Janee Mornhompson.  Will follow along.

## 2016-05-05 ENCOUNTER — Emergency Department (HOSPITAL_COMMUNITY): Payer: Managed Care, Other (non HMO)

## 2016-05-05 ENCOUNTER — Inpatient Hospital Stay (HOSPITAL_COMMUNITY)
Admission: EM | Admit: 2016-05-05 | Discharge: 2016-05-09 | DRG: 389 | Disposition: A | Discharge status: Home / Self Care | Payer: Managed Care, Other (non HMO) | Attending: Internal Medicine | Admitting: Internal Medicine

## 2016-05-05 ENCOUNTER — Encounter (HOSPITAL_COMMUNITY): Payer: Self-pay

## 2016-05-05 DIAGNOSIS — F411 Generalized anxiety disorder: Secondary | ICD-10-CM | POA: Diagnosis present

## 2016-05-05 DIAGNOSIS — Z7982 Long term (current) use of aspirin: Secondary | ICD-10-CM

## 2016-05-05 DIAGNOSIS — K5792 Diverticulitis of intestine, part unspecified, without perforation or abscess without bleeding: Secondary | ICD-10-CM

## 2016-05-05 DIAGNOSIS — I10 Essential (primary) hypertension: Secondary | ICD-10-CM | POA: Diagnosis present

## 2016-05-05 DIAGNOSIS — K567 Ileus, unspecified: Principal | ICD-10-CM

## 2016-05-05 DIAGNOSIS — F10231 Alcohol dependence with withdrawal delirium: Secondary | ICD-10-CM | POA: Diagnosis present

## 2016-05-05 DIAGNOSIS — R1032 Left lower quadrant pain: Secondary | ICD-10-CM | POA: Diagnosis not present

## 2016-05-05 DIAGNOSIS — F1010 Alcohol abuse, uncomplicated: Secondary | ICD-10-CM | POA: Diagnosis not present

## 2016-05-05 DIAGNOSIS — R011 Cardiac murmur, unspecified: Secondary | ICD-10-CM | POA: Diagnosis present

## 2016-05-05 DIAGNOSIS — F17200 Nicotine dependence, unspecified, uncomplicated: Secondary | ICD-10-CM | POA: Diagnosis present

## 2016-05-05 DIAGNOSIS — K5900 Constipation, unspecified: Secondary | ICD-10-CM

## 2016-05-05 DIAGNOSIS — K5909 Other constipation: Secondary | ICD-10-CM | POA: Diagnosis present

## 2016-05-05 DIAGNOSIS — N321 Vesicointestinal fistula: Secondary | ICD-10-CM | POA: Diagnosis present

## 2016-05-05 DIAGNOSIS — F17210 Nicotine dependence, cigarettes, uncomplicated: Secondary | ICD-10-CM | POA: Diagnosis present

## 2016-05-05 DIAGNOSIS — Z823 Family history of stroke: Secondary | ICD-10-CM

## 2016-05-05 LAB — URINE MICROSCOPIC-ADD ON: Squamous Epithelial / LPF: NONE SEEN

## 2016-05-05 LAB — URINALYSIS, ROUTINE W REFLEX MICROSCOPIC
Bilirubin Urine: NEGATIVE
Glucose, UA: NEGATIVE mg/dL
Hgb urine dipstick: NEGATIVE
Ketones, ur: 40 mg/dL — AB
NITRITE: NEGATIVE
PH: 6 (ref 5.0–8.0)
Protein, ur: 30 mg/dL — AB
SPECIFIC GRAVITY, URINE: 1.022 (ref 1.005–1.030)

## 2016-05-05 LAB — RAPID URINE DRUG SCREEN, HOSP PERFORMED
AMPHETAMINES: NOT DETECTED
BENZODIAZEPINES: NOT DETECTED
Barbiturates: NOT DETECTED
COCAINE: NOT DETECTED
OPIATES: POSITIVE — AB
TETRAHYDROCANNABINOL: NOT DETECTED

## 2016-05-05 LAB — COMPREHENSIVE METABOLIC PANEL
ALBUMIN: 3.8 g/dL (ref 3.5–5.0)
ALK PHOS: 75 U/L (ref 38–126)
ALT: 17 U/L (ref 17–63)
ANION GAP: 14 (ref 5–15)
AST: 44 U/L — ABNORMAL HIGH (ref 15–41)
BILIRUBIN TOTAL: 0.6 mg/dL (ref 0.3–1.2)
BUN: <5 mg/dL — ABNORMAL LOW (ref 6–20)
CALCIUM: 9.2 mg/dL (ref 8.9–10.3)
CO2: 21 mmol/L — AB (ref 22–32)
Chloride: 103 mmol/L (ref 101–111)
Creatinine, Ser: 0.71 mg/dL (ref 0.61–1.24)
GFR calc non Af Amer: >60 mL/min (ref >60)
GLUCOSE: 97 mg/dL (ref 65–99)
POTASSIUM: 4.3 mmol/L (ref 3.5–5.1)
SODIUM: 138 mmol/L (ref 135–145)
TOTAL PROTEIN: 8 g/dL (ref 6.5–8.1)

## 2016-05-05 LAB — CBC
HEMATOCRIT: 48.8 % (ref 39.0–52.0)
HEMOGLOBIN: 16.8 g/dL (ref 13.0–17.0)
MCH: 32.8 pg (ref 26.0–34.0)
MCHC: 34.4 g/dL (ref 30.0–36.0)
MCV: 95.3 fL (ref 78.0–100.0)
Platelets: 375 10*3/uL (ref 150–400)
RBC: 5.12 MIL/uL (ref 4.22–5.81)
RDW: 14.3 % (ref 11.5–15.5)
WBC: 9.5 10*3/uL (ref 4.0–10.5)

## 2016-05-05 LAB — PHOSPHORUS: PHOSPHORUS: 3.3 mg/dL (ref 2.5–4.6)

## 2016-05-05 LAB — LIPASE, BLOOD: Lipase: 50 U/L (ref 11–51)

## 2016-05-05 LAB — ETHANOL: ALCOHOL ETHYL (B): 326 mg/dL — AB (ref <5)

## 2016-05-05 LAB — MAGNESIUM: MAGNESIUM: 1.6 mg/dL — AB (ref 1.7–2.4)

## 2016-05-05 LAB — CBG MONITORING, ED: GLUCOSE-CAPILLARY: 92 mg/dL (ref 65–99)

## 2016-05-05 MED ORDER — PNEUMOCOCCAL VAC POLYVALENT 25 MCG/0.5ML IJ INJ
0.5000 mL | INJECTION | INTRAMUSCULAR | Status: AC
  Administered 2016-05-06: 0.5 mL via INTRAMUSCULAR
  Filled 2016-05-05: qty 0.5

## 2016-05-05 MED ORDER — ONDANSETRON HCL 4 MG/2ML IJ SOLN
4.0000 mg | Freq: Four times a day (QID) | INTRAMUSCULAR | Status: DC | PRN
Start: 2016-05-05 — End: 2016-05-09
  Administered 2016-05-05: 4 mg via INTRAVENOUS
  Filled 2016-05-05: qty 2

## 2016-05-05 MED ORDER — HYDROMORPHONE HCL 1 MG/ML IJ SOLN
1.0000 mg | Freq: Once | INTRAMUSCULAR | Status: AC
  Administered 2016-05-05: 1 mg via INTRAVENOUS
  Filled 2016-05-05: qty 1

## 2016-05-05 MED ORDER — MORPHINE SULFATE (PF) 2 MG/ML IV SOLN
2.0000 mg | INTRAVENOUS | Status: DC | PRN
  Administered 2016-05-06: 2 mg via INTRAVENOUS
  Filled 2016-05-05: qty 1

## 2016-05-05 MED ORDER — ENOXAPARIN SODIUM 40 MG/0.4ML ~~LOC~~ SOLN
40.0000 mg | SUBCUTANEOUS | Status: DC
  Administered 2016-05-05 – 2016-05-07 (×3): 40 mg via SUBCUTANEOUS
  Filled 2016-05-05 (×4): qty 0.4

## 2016-05-05 MED ORDER — ADULT MULTIVITAMIN W/MINERALS CH
1.0000 | ORAL_TABLET | Freq: Every day | ORAL | Status: DC
  Administered 2016-05-05 – 2016-05-09 (×5): 1 via ORAL
  Filled 2016-05-05 (×5): qty 1

## 2016-05-05 MED ORDER — KCL IN DEXTROSE-NACL 20-5-0.9 MEQ/L-%-% IV SOLN
INTRAVENOUS | Status: DC
  Administered 2016-05-05: 19:00:00 via INTRAVENOUS
  Filled 2016-05-05 (×4): qty 1000

## 2016-05-05 MED ORDER — LORAZEPAM 2 MG/ML IJ SOLN
1.0000 mg | Freq: Every evening | INTRAMUSCULAR | Status: DC | PRN
  Administered 2016-05-07 – 2016-05-09 (×3): 1 mg via INTRAVENOUS
  Filled 2016-05-05 (×2): qty 1

## 2016-05-05 MED ORDER — ONDANSETRON HCL 4 MG/2ML IJ SOLN
4.0000 mg | Freq: Once | INTRAMUSCULAR | Status: AC
  Administered 2016-05-05: 4 mg via INTRAVENOUS
  Filled 2016-05-05: qty 2

## 2016-05-05 MED ORDER — ACETAMINOPHEN 650 MG RE SUPP
650.0000 mg | Freq: Four times a day (QID) | RECTAL | Status: DC | PRN
Start: 2016-05-05 — End: 2016-05-09

## 2016-05-05 MED ORDER — ONDANSETRON HCL 4 MG PO TABS: 4.0000 mg | ORAL_TABLET | Freq: Four times a day (QID) | ORAL | Status: DC | PRN

## 2016-05-05 MED ORDER — PIPERACILLIN-TAZOBACTAM 3.375 G IVPB
3.3750 g | Freq: Three times a day (TID) | INTRAVENOUS | Status: DC
  Administered 2016-05-05 – 2016-05-06 (×4): 3.375 g via INTRAVENOUS
  Filled 2016-05-05 (×6): qty 50

## 2016-05-05 MED ORDER — VITAMIN B-1 100 MG PO TABS
100.0000 mg | ORAL_TABLET | Freq: Every day | ORAL | Status: DC
  Administered 2016-05-05 – 2016-05-09 (×5): 100 mg via ORAL
  Filled 2016-05-05 (×5): qty 1

## 2016-05-05 MED ORDER — FOLIC ACID 5 MG/ML IJ SOLN: 1.0000 mg | Freq: Every day | INTRAMUSCULAR | Status: DC

## 2016-05-05 MED ORDER — FAMOTIDINE IN NACL 20-0.9 MG/50ML-% IV SOLN
20.0000 mg | Freq: Two times a day (BID) | INTRAVENOUS | Status: DC
  Administered 2016-05-06 (×2): 20 mg via INTRAVENOUS
  Filled 2016-05-05 (×2): qty 50

## 2016-05-05 MED ORDER — LORAZEPAM 2 MG/ML IJ SOLN
0.0000 mg | Freq: Two times a day (BID) | INTRAMUSCULAR | Status: DC
Start: 2016-05-07 — End: 2016-05-06

## 2016-05-05 MED ORDER — THIAMINE HCL 100 MG/ML IJ SOLN: 100.0000 mg | Freq: Every day | INTRAMUSCULAR | Status: DC

## 2016-05-05 MED ORDER — SODIUM CHLORIDE 0.9% FLUSH
3.0000 mL | Freq: Two times a day (BID) | INTRAVENOUS | Status: DC
  Administered 2016-05-05 – 2016-05-09 (×5): 3 mL via INTRAVENOUS

## 2016-05-05 MED ORDER — ACETAMINOPHEN 325 MG PO TABS: 650.0000 mg | ORAL_TABLET | Freq: Four times a day (QID) | ORAL | Status: DC | PRN

## 2016-05-05 MED ORDER — FOLIC ACID 1 MG PO TABS
1.0000 mg | ORAL_TABLET | Freq: Every day | ORAL | Status: DC
  Administered 2016-05-05 – 2016-05-09 (×5): 1 mg via ORAL
  Filled 2016-05-05 (×5): qty 1

## 2016-05-05 MED ORDER — LORAZEPAM 2 MG/ML IJ SOLN
0.0000 mg | Freq: Four times a day (QID) | INTRAMUSCULAR | Status: DC
Start: 2016-05-05 — End: 2016-05-06
  Administered 2016-05-05 – 2016-05-06 (×2): 2 mg via INTRAVENOUS
  Filled 2016-05-05 (×2): qty 1
  Filled 2016-05-05: qty 2

## 2016-05-05 MED ORDER — ALBUTEROL SULFATE (2.5 MG/3ML) 0.083% IN NEBU: 3.0000 mL | INHALATION_SOLUTION | Freq: Four times a day (QID) | RESPIRATORY_TRACT | Status: DC | PRN

## 2016-05-05 MED ORDER — LORAZEPAM 1 MG PO TABS
1.0000 mg | ORAL_TABLET | Freq: Four times a day (QID) | ORAL | Status: AC | PRN
  Administered 2016-05-05 – 2016-05-08 (×4): 1 mg via ORAL
  Filled 2016-05-05 (×5): qty 1

## 2016-05-05 MED ORDER — KETOROLAC TROMETHAMINE 15 MG/ML IJ SOLN
15.0000 mg | Freq: Four times a day (QID) | INTRAMUSCULAR | Status: DC
  Administered 2016-05-05 – 2016-05-09 (×16): 15 mg via INTRAVENOUS
  Filled 2016-05-05 (×18): qty 1

## 2016-05-05 MED ORDER — LORAZEPAM 2 MG/ML IJ SOLN
1.0000 mg | Freq: Four times a day (QID) | INTRAMUSCULAR | Status: AC | PRN
  Administered 2016-05-06 – 2016-05-07 (×2): 1 mg via INTRAVENOUS
  Filled 2016-05-05 (×2): qty 1

## 2016-05-05 NOTE — Progress Notes (Signed)
Pharmacy Antibiotic Note  Roselee NovaScott Crute is a 65 y.o. male admitted on 05/05/2016 with intra-abdominal infection.  Pharmacy has been consulted for Zosyn dosing.  Plan: Zosyn 3.375g IV q8h (4 hour infusion).  Height: 5\' 9"  (175.3 cm) Weight: 162 lb (73.483 kg) IBW/kg (Calculated) : 70.7  Temp (24hrs), Avg:97.7 F (36.5 C), Min:97.7 F (36.5 C), Max:97.7 F (36.5 C)   Recent Labs Lab 05/05/16 1308  WBC 9.5  CREATININE 0.71    Estimated Creatinine Clearance: 94.5 mL/min (by C-G formula based on Cr of 0.71).    No Known Allergies  Antimicrobials this admission: 7/9 Zosyn >>      Dose adjustments this admission:  Microbiology results:  BCx:   UCx:    Sputum:    MRSA PCR:   Thank you for allowing pharmacy to be a part of this patient's care.  Link SnufferJessica Blanch Stang, PharmD, BCPS Clinical Pharmacist [phone] 05/05/2016 3:30 PM

## 2016-05-05 NOTE — H&P (Signed)
History and Physical    Alvin Benton ZOX:[id number] DOB: 02/04/1951 DOA: 05/05/2016   PCP: Neena Rhymes, MD   Patient coming from/Resides with: Private residence/lives alone  Chief Complaint: Left lower quadrant abdominal pain  HPI: Alvin Benton is a 65 y.o. male with medical history significant for ongoing alcoholism and tobacco abuse, acute diverticulitis initially hospitalized in May of this year with recent hospitalization early June for exacerbation of diverticulitis with possible colovesical fistula and a tiny microperforation, murmur since childhood, chronic anxiety and chronic constipation. Also during previous hospitalization patient had transient PAF which resolved independent of treatment. CHADVASC was 1. Patient was discharged on 10 days of Cipro and Flagyl which he has completed. He reports that this past Friday he began having increasing left lower quadrant pain, poor intake for 3 days, increasing flatus and anorexia. He reports subjective fevers and chills and inability to sleep. He also reports emesis and most recently dry heaves. He reports he has yet to follow up with the surgery team as an outpatient because "I keep having these flares and have to come to the hospital". Since discharge he has followed up with his primary care physician (on 6/23). At that time he told her about "a miracle drug-lorazepam" he insisted he was no longer drinking and requested a prescription for this medication from his primary care physician. His primary care physician also formally arranged surgery follow-up for the patient  ED Course:  Vital signs: PO temp 97.7-BP 155/108-pulse 117-respirations 22-tremors saturations 100%  AAS: Bowel gas pattern consistent with ileus versus enteritis, early bowel obstruction possible but felt less likely given significant amount of air in the colon. No free air. Chest x-ray unremarkable Lab data: Sodium 138, potassium 4.3, CO2 21, BUN less than 5, creatinine 0.71,  AST 44, white count 9500 differential not obtained, hemoglobin 16.8, platelets 375,000; urinalysis abnormal with few bacteria, amber color, 40 ketones, moderate leukocytes, 30 protein, yeast, too numerous to count the WBCs Medications and treatments: Dilaudid 1 mg IV 1, Zofran 4 mg IV 1, Zosyn 3.375 g IV 1  Review of Systems:  In addition to the HPI above,  No Headache, changes with Vision or hearing, new weakness, tingling, numbness in any extremity, No problems swallowing food or Liquids, indigestion/reflux No Chest pain, Cough or Shortness of Breath, palpitations, orthopnea or DOE No melena or hematochezia, no dark tarry stools No dysuria, hematuria or flank pain No new skin rashes, lesions, masses or bruises, No new joints pains-aches No recent weight gain or loss No polyuria, polydypsia or polyphagia,   Past Medical History  Diagnosis Date  . Blood in stool   . IBS (irritable bowel syndrome)   . Diverticulitis   . Hypertension   . Migraine     "none in a long time" (03/14/2016)    Past Surgical History  Procedure Laterality Date  . Colonoscopy    . Tonsillectomy      Social History   Social History  . Marital Status: Married    Spouse Name: N/A  . Number of Children: 1  . Years of Education: N/A   Occupational History  . Inventory     Social History Main Topics  . Smoking status: Current Every Day Smoker -- 1.00 packs/day for 40 years    Types: Cigarettes  . Smokeless tobacco: Never Used  . Alcohol Use: 4.8 oz/week    8 Cans of beer per week  . Drug Use: No  . Sexual Activity: Not Currently   Other Topics  Concern  . Not on file   Social History Narrative   Area and one daughter   Regulatory affairs officer at [employer]   One caffeinated beverage a day    Mobility: Without assistive devices Work history: Counsellor   No Known Allergies  Family History  Problem Relation Age of Onset  . Stroke Mother   . Colon cancer Neg Hx   .  Esophageal cancer Neg Hx   . Rectal cancer Neg Hx   . Stomach cancer Neg Hx      Prior to Admission medications   Medication Sig Start Date End Date Taking? Authorizing Provider  albuterol (PROVENTIL HFA;VENTOLIN HFA) 108 (90 BASE) MCG/ACT inhaler Inhale 2 puffs into the lungs every 6 (six) hours as needed for wheezing or shortness of breath. 07/17/15  Yes Sheliah Hatch, MD  aspirin EC 81 MG tablet Take 1 tablet (81 mg total) by mouth daily. 04/11/16  Yes Mauricio Annett Gula, MD  folic acid (FOLVITE) 1 MG tablet Take 1 tablet (1 mg total) by mouth daily. 03/08/16 03/08/17 Yes Sheliah Hatch, MD  LORazepam (ATIVAN) 1 MG tablet Take 1 tablet (1 mg total) by mouth daily. 04/19/16  Yes Sheliah Hatch, MD  Multiple Vitamin (MULTIVITAMIN WITH MINERALS) TABS tablet Take 1 tablet by mouth once a week.   Yes Historical Provider, MD  Simethicone (GAS-X PO) Take 1 tablet by mouth daily as needed (gas).   Yes Historical Provider, MD  thiamine (VITAMIN B-1) 100 MG tablet Take 100 mg by mouth daily.   Yes Historical Provider, MD  valsartan (DIOVAN) 160 MG tablet TAKE 1 TABLET (160 MG TOTAL) BY MOUTH DAILY. 10/27/15  Yes Sheliah Hatch, MD  ciprofloxacin (CIPRO) 500 MG tablet Take 1 tablet (500 mg total) by mouth 2 (two) times daily. Patient not taking: Reported on 05/05/2016 04/11/16   Coralie Keens, MD  metroNIDAZOLE (FLAGYL) 500 MG tablet Take 1 tablet (500 mg total) by mouth 3 (three) times daily. Patient not taking: Reported on 05/05/2016 04/11/16   Coralie Keens, MD  mometasone (NASONEX) 50 MCG/ACT nasal spray Place 2 sprays into the nose daily. Patient not taking: Reported on 05/05/2016 07/17/15   Sheliah Hatch, MD    Physical Exam: Filed Vitals:   05/05/16 1515 05/05/16 1545 05/05/16 1615 05/05/16 1630  BP: 143/85 126/82 132/80 121/71  Pulse: 102 104 106 100  Temp:      TempSrc:      Resp:  20 20 18   Height:      Weight:      SpO2: 96% 91% 94% 91%       Constitutional: Restless and flushed and somewhat tremulous Eyes: PERRL, lids and conjunctivae normal ENMT: Mucous membranes are dry. Posterior pharynx clear of any exudate or lesions.Normal dentition.  Neck: normal, supple, no masses, no thyromegaly Respiratory: clear to auscultation bilaterally, no wheezing, no crackles. Normal respiratory effort. No accessory muscle use.  Cardiovascular: Regular rate and rhythm, systolic murmur grade 3/6 left sternal border fifth intercostal space, no rub- S4 gallop right sternal border. No extremity edema. 2+ pedal pulses. No carotid bruits.  Abdomen: Exquisitely tender left abdomen especially left lower quadrant with guarding but no rebounding, no masses palpated. No hepatosplenomegaly. Bowel sounds positive. Positive flatus passed while I was at bedside Musculoskeletal: no clubbing / cyanosis. No joint deformity upper and lower extremities. Good ROM, no contractures. Normal muscle tone.  Skin: no rashes, lesions, ulcers. No induration Neurologic: CN 2-12 grossly intact. Sensation  intact, DTR normal. Strength 5/5 x all 4 extremities.  Psychiatric: Normal judgment and insight. Alert and oriented x 3. Anxious mood is tremulous.    Labs on Admission: I have personally reviewed following labs and imaging studies  CBC:  Recent Labs Lab 05/05/16 1308  WBC 9.5  HGB 16.8  HCT 48.8  MCV 95.3  PLT 375   Basic Metabolic Panel:  Recent Labs Lab 05/05/16 1308  NA 138  K 4.3  CL 103  CO2 21*  GLUCOSE 97  BUN <5*  CREATININE 0.71  CALCIUM 9.2   GFR: Estimated Creatinine Clearance: 94.5 mL/min (by C-G formula based on Cr of 0.71). Liver Function Tests:  Recent Labs Lab 05/05/16 1308  AST 44*  ALT 17  ALKPHOS 75  BILITOT 0.6  PROT 8.0  ALBUMIN 3.8    Recent Labs Lab 05/05/16 1308  LIPASE 50   No results for input(s): AMMONIA in the last 168 hours. Coagulation Profile: No results for input(s): INR, PROTIME in the last 168  hours. Cardiac Enzymes: No results for input(s): CKTOTAL, CKMB, CKMBINDEX, TROPONINI in the last 168 hours. BNP (last 3 results) No results for input(s): PROBNP in the last 8760 hours. HbA1C: No results for input(s): HGBA1C in the last 72 hours. CBG:  Recent Labs Lab 05/05/16 1258  GLUCAP 92   Lipid Profile: No results for input(s): CHOL, HDL, LDLCALC, TRIG, CHOLHDL, LDLDIRECT in the last 72 hours. Thyroid Function Tests: No results for input(s): TSH, T4TOTAL, FREET4, T3FREE, THYROIDAB in the last 72 hours. Anemia Panel: No results for input(s): VITAMINB12, FOLATE, FERRITIN, TIBC, IRON, RETICCTPCT in the last 72 hours. Urine analysis:    Component Value Date/Time   COLORURINE AMBER* 05/05/2016 1513   APPEARANCEUR CLOUDY* 05/05/2016 1513   LABSPEC 1.022 05/05/2016 1513   PHURINE 6.0 05/05/2016 1513   GLUCOSEU NEGATIVE 05/05/2016 1513   HGBUR NEGATIVE 05/05/2016 1513   BILIRUBINUR NEGATIVE 05/05/2016 1513   KETONESUR 40* 05/05/2016 1513   PROTEINUR 30* 05/05/2016 1513   NITRITE NEGATIVE 05/05/2016 1513   LEUKOCYTESUR MODERATE* 05/05/2016 1513   Sepsis Labs: (procalcitonin:4,lacticidven:4) )No results found for this or any previous visit (from the past 240 hour(s)).   Radiological Exams on Admission: Dg Abd Acute W/chest  05/05/2016  CLINICAL DATA:  Abdominal pain with history of diverticulitis EXAM: DG ABDOMEN ACUTE W/ 1V CHEST COMPARISON:  Chest radiograph April 05, 2016; CT abdomen and pelvis April 04, 2016 FINDINGS: PA chest: No edema or consolidation. Heart size and pulmonary vascularity are normal. No adenopathy. Supine and upright abdomen: There are several loops of mildly dilated bowel with scattered air-fluid levels. No free air. There is a small phlebolith in the lower pelvis. Note that there is present in the rectum. IMPRESSION: The bowel gas pattern raises question of ileus or enteritis. Early bowel obstruction is possible but felt to be somewhat less likely  given the degree of air in the colon. No free air evident. No lung edema or consolidation. Electronically Signed   By: Bretta Bang III M.D.   On: 05/05/2016 14:45     Assessment/Plan Principal Problem:   Ileus  -Presents with abdominal pain and subjective fevers, chills, nausea and vomiting in setting of recent hospitalization for acute diverticulitis with possible colovesical fistula seen on CT scan -Patient with bowel sounds and passing flatus so doubt obstructive pattern -Conservative management with bowel rest and IV fluids -Minimize narcotics and other sedative medications-patient made aware and was instructed we would not escalate narcotic pain medications -Scheduled  Toradol with Pepcid IV -IV fluid D5 normal saline with potassium at 125 mL per hour -If not improving may require follow-up CT scan this admission -Patient has follow-up appointment scheduled with Dr. Ann Makihompson/[facility] next week  Active Problems:   Diverticulitis/Colovesical fistula -Recently completed a prolonged course of Cipro and Flagyl -Does have recurrent left lower quadrant abdominal pain but no leukocytosis -As precaution continue Zosyn IV -Urinalysis abnormal and with concerns over possible colovesical fistula on prior CT will check urine culture    Alcohol abuse -Patient admits to continuing to drink alcoholic beverages- he reports 4-5 beverages per week despite telling his primary care physician he no longer drinks alcoholic beverages; this appears to have been done so he could obtain a prescription for Ativan -Serum alcohol level today was markedly elevated at 326 and AST was elevated at 44 -CIWA protocol -UDS    Anxiety state -As above    Systolic murmur -Patient reports has had murmur since childhood -Deferred to primary care physician whether echocardiogram indicated    Tobacco use disorder -Cessation counseling provided    Chronic constipation -Likely contributing factor to diverticulitis       DVT prophylaxis: Lovenox Code Status: Full code Family Communication: No family at bedside Disposition Plan: Anticipate discharge back to previous home environment once medically stable Consults called: None Admission status: Observation/medical floor    Tanvi Gatling L. ANP-BC [facility] Pager [phone]   If 7PM-7AM, please contact night-coverage [email or web address] Password [email or web address]  05/05/2016, 5:00 PM

## 2016-05-05 NOTE — ED Notes (Addendum)
Patient complains of abdominal pain that started again this past Friday. States he has diverticulitis and knows its his monthly flare-up, has not seen surgeon as instructed. Nausea, vomiting and diarrhea with same. Hyperventilating on arival

## 2016-05-05 NOTE — ED Notes (Signed)
Patient transported to X-ray 

## 2016-05-05 NOTE — ED Notes (Signed)
Pt returned from X-ray.  

## 2016-05-05 NOTE — ED Notes (Signed)
Pt given urinal to provide urine sample

## 2016-05-05 NOTE — ED Notes (Signed)
Pt anxious. Pt encouraged to take deep breaths and focus on calming himself down. Pt reassured and waiting to see PA.

## 2016-05-06 ENCOUNTER — Telehealth: Payer: Self-pay | Admitting: General Practice

## 2016-05-06 DIAGNOSIS — N321 Vesicointestinal fistula: Secondary | ICD-10-CM | POA: Diagnosis present

## 2016-05-06 DIAGNOSIS — R1032 Left lower quadrant pain: Secondary | ICD-10-CM | POA: Diagnosis present

## 2016-05-06 DIAGNOSIS — F10231 Alcohol dependence with withdrawal delirium: Secondary | ICD-10-CM | POA: Diagnosis present

## 2016-05-06 DIAGNOSIS — Z7982 Long term (current) use of aspirin: Secondary | ICD-10-CM | POA: Diagnosis not present

## 2016-05-06 DIAGNOSIS — K567 Ileus, unspecified: Secondary | ICD-10-CM | POA: Diagnosis not present

## 2016-05-06 DIAGNOSIS — F411 Generalized anxiety disorder: Secondary | ICD-10-CM | POA: Diagnosis present

## 2016-05-06 DIAGNOSIS — F10239 Alcohol dependence with withdrawal, unspecified: Secondary | ICD-10-CM | POA: Diagnosis not present

## 2016-05-06 DIAGNOSIS — F1010 Alcohol abuse, uncomplicated: Secondary | ICD-10-CM | POA: Diagnosis not present

## 2016-05-06 DIAGNOSIS — K5792 Diverticulitis of intestine, part unspecified, without perforation or abscess without bleeding: Secondary | ICD-10-CM | POA: Diagnosis present

## 2016-05-06 DIAGNOSIS — F17200 Nicotine dependence, unspecified, uncomplicated: Secondary | ICD-10-CM | POA: Diagnosis not present

## 2016-05-06 DIAGNOSIS — F17210 Nicotine dependence, cigarettes, uncomplicated: Secondary | ICD-10-CM | POA: Diagnosis present

## 2016-05-06 DIAGNOSIS — I10 Essential (primary) hypertension: Secondary | ICD-10-CM | POA: Diagnosis present

## 2016-05-06 DIAGNOSIS — Z823 Family history of stroke: Secondary | ICD-10-CM | POA: Diagnosis not present

## 2016-05-06 LAB — CBC
HCT: 40.6 % (ref 39.0–52.0)
Hemoglobin: 13.4 g/dL (ref 13.0–17.0)
MCH: 31.6 pg (ref 26.0–34.0)
MCHC: 33 g/dL (ref 30.0–36.0)
MCV: 95.8 fL (ref 78.0–100.0)
PLATELETS: 235 10*3/uL (ref 150–400)
RBC: 4.24 MIL/uL (ref 4.22–5.81)
RDW: 14.5 % (ref 11.5–15.5)
WBC: 6.9 10*3/uL (ref 4.0–10.5)

## 2016-05-06 LAB — URINE CULTURE

## 2016-05-06 LAB — COMPREHENSIVE METABOLIC PANEL
ALBUMIN: 2.8 g/dL — AB (ref 3.5–5.0)
ALK PHOS: 55 U/L (ref 38–126)
ALT: 11 U/L — ABNORMAL LOW (ref 17–63)
ANION GAP: 8 (ref 5–15)
AST: 33 U/L (ref 15–41)
BUN: 6 mg/dL (ref 6–20)
CALCIUM: 8.8 mg/dL — AB (ref 8.9–10.3)
CHLORIDE: 105 mmol/L (ref 101–111)
CO2: 23 mmol/L (ref 22–32)
CREATININE: 0.62 mg/dL (ref 0.61–1.24)
GFR calc non Af Amer: >60 mL/min (ref >60)
Glucose, Bld: 90 mg/dL (ref 65–99)
POTASSIUM: 4.1 mmol/L (ref 3.5–5.1)
SODIUM: 136 mmol/L (ref 135–145)
Total Bilirubin: 1 mg/dL (ref 0.3–1.2)
Total Protein: 6.2 g/dL — ABNORMAL LOW (ref 6.5–8.1)

## 2016-05-06 LAB — BASIC METABOLIC PANEL
Anion gap: 7 (ref 5–15)
BUN: 10 mg/dL (ref 6–20)
CALCIUM: 8.5 mg/dL — AB (ref 8.9–10.3)
CO2: 24 mmol/L (ref 22–32)
CREATININE: 0.67 mg/dL (ref 0.61–1.24)
Chloride: 102 mmol/L (ref 101–111)
GFR calc non Af Amer: >60 mL/min (ref >60)
GLUCOSE: 109 mg/dL — AB (ref 65–99)
Potassium: 3.9 mmol/L (ref 3.5–5.1)
Sodium: 133 mmol/L — ABNORMAL LOW (ref 135–145)

## 2016-05-06 LAB — APTT: aPTT: 29 seconds (ref 24–37)

## 2016-05-06 LAB — PROTIME-INR
INR: 1.07 (ref 0.00–1.49)
PROTHROMBIN TIME: 14.1 s (ref 11.6–15.2)

## 2016-05-06 LAB — MRSA PCR SCREENING: MRSA by PCR: NEGATIVE

## 2016-05-06 MED ORDER — BISACODYL 10 MG RE SUPP
10.0000 mg | Freq: Two times a day (BID) | RECTAL | Status: AC
  Administered 2016-05-06: 10 mg via RECTAL
  Filled 2016-05-06 (×2): qty 1

## 2016-05-06 MED ORDER — POLYETHYLENE GLYCOL 3350 17 G PO PACK
17.0000 g | PACK | Freq: Two times a day (BID) | ORAL | Status: DC
  Administered 2016-05-06: 17 g via ORAL
  Filled 2016-05-06 (×2): qty 1

## 2016-05-06 MED ORDER — KCL IN DEXTROSE-NACL 20-5-0.9 MEQ/L-%-% IV SOLN
INTRAVENOUS | Status: DC
  Administered 2016-05-06 – 2016-05-08 (×7): via INTRAVENOUS
  Filled 2016-05-06 (×8): qty 1000

## 2016-05-06 MED ORDER — VITAMIN B-1 100 MG PO TABS: 100.0000 mg | ORAL_TABLET | Freq: Every day | ORAL | Status: DC

## 2016-05-06 MED ORDER — LORAZEPAM 2 MG/ML IJ SOLN
2.0000 mg | INTRAMUSCULAR | Status: DC | PRN
  Administered 2016-05-08 (×2): 2 mg via INTRAVENOUS
  Filled 2016-05-06 (×5): qty 1

## 2016-05-06 MED ORDER — ADULT MULTIVITAMIN W/MINERALS CH: 1.0000 | ORAL_TABLET | Freq: Every day | ORAL | Status: DC

## 2016-05-06 MED ORDER — MORPHINE SULFATE (PF) 2 MG/ML IV SOLN
1.0000 mg | INTRAVENOUS | Status: DC | PRN
  Administered 2016-05-09: 1 mg via INTRAVENOUS
  Filled 2016-05-06: qty 1

## 2016-05-06 MED ORDER — FOLIC ACID 1 MG PO TABS: 1.0000 mg | ORAL_TABLET | Freq: Every day | ORAL | Status: DC

## 2016-05-06 MED ORDER — LORAZEPAM 2 MG/ML IJ SOLN
2.0000 mg | Freq: Once | INTRAMUSCULAR | Status: AC
  Administered 2016-05-06: 2 mg via INTRAVENOUS
  Filled 2016-05-06: qty 1

## 2016-05-06 MED ORDER — MORPHINE SULFATE (PF) 2 MG/ML IV SOLN: 1.0000 mg | INTRAVENOUS | Status: DC | PRN

## 2016-05-06 MED ORDER — THIAMINE HCL 100 MG/ML IJ SOLN
Freq: Once | INTRAVENOUS | Status: AC
  Administered 2016-05-06: 15:00:00 via INTRAVENOUS
  Filled 2016-05-06: qty 1000

## 2016-05-06 MED ORDER — CLONIDINE HCL 0.1 MG PO TABS
0.1000 mg | ORAL_TABLET | Freq: Three times a day (TID) | ORAL | Status: DC
  Administered 2016-05-06 – 2016-05-07 (×5): 0.1 mg via ORAL
  Filled 2016-05-06 (×6): qty 1

## 2016-05-06 NOTE — Progress Notes (Signed)
Initial Nutrition Assessment  DOCUMENTATION CODES:   Not applicable  INTERVENTION:  Once diet advances, recommend providing Ensure as appropriate.   Will continue to monitor.   NUTRITION DIAGNOSIS:   Inadequate oral intake related to altered GI function as evidenced by NPO status.  GOAL:   Patient will meet greater than or equal to 90% of their needs  MONITOR:   Weight trends, Labs, I & O's, Diet advancement  REASON FOR ASSESSMENT:   Malnutrition Screening Tool    ASSESSMENT:   65 y.o. male with medical history significant for ongoing alcoholism and tobacco abuse, acute diverticulitis initially hospitalized in May of this year with recent hospitalization early June for exacerbation of diverticulitis with possible colovesical fistula and a tiny microperforation. Pt reports that this past Friday he began having increasing left lower quadrant pain, poor intake for 3 days, increasing flatus and anorexia. He reports subjective fevers and chills and inability to sleep. He also reports emesis and most recently dry heaves.  Pt reports abdominal pains have improved. Pt reports a 33 lb weight loss over the past 5 months. Pt with a reported 16.9% weight loss in in 5 months. Pt attributes the weight loss from bring mainly on a liquid diet due to his diverticulitis. Pt does report consuming Ensure at home at least once daily. RD to order Ensure once diet advances if/when appropriate.   Pt with no observed significant fat or muscle mass loss.   Labs and medications reviewed.   Diet Order:  Diet NPO time specified  Skin:  Reviewed, no issues  Last BM:  7/10  Height:   Ht Readings from Last 1 Encounters:  05/05/16 5\' 9"  (1.753 m)    Weight:   Wt Readings from Last 1 Encounters:  05/05/16 162 lb (73.483 kg)    Ideal Body Weight:  72.7 kg  BMI:  Body mass index is 23.91 kg/(m^2).  Estimated Nutritional Needs:   Kcal:  1950-2150  Protein:  90-100 grams  Fluid:  1.9 -  2.1 L/day  EDUCATION NEEDS:   No education needs identified at this time  Roslyn SmilingStephanie Juniper Snyders, MS, RD, LDN Pager # [phone] After hours/ weekend pager # [phone]

## 2016-05-06 NOTE — Progress Notes (Signed)
   05/06/16 0915  Clinical Encounter Type  Visited With Patient  Visit Type Initial;Spiritual support  Referral From Nurse  Spiritual Encounters  Spiritual Needs Emotional  Stress Factors  Patient Stress Factors Health changes  Family Stress Factors Not reviewed  Chaplain responded to consult. Chaplain visited with patient.  Chaplain chatted with patient.  Patient shared he just wanted to be normal and he felt he was sad but, not depressed.  Chaplain made further support if needed. Patient might benefit from meeting with a male Chaplain.

## 2016-05-06 NOTE — Progress Notes (Signed)
05/06/2016 pneumococcal vaccine given in let deltoid at 1836. Lot #: [id number] and expire August 18, 2017. Lovie MacadamiaNadine Dahir Ayer RN

## 2016-05-06 NOTE — ED Provider Notes (Signed)
CSN: [id number]     Arrival date & time 05/05/16  1254 History   First MD Initiated Contact with Patient 05/05/16 1339     Chief Complaint  Patient presents with  . Abdominal Pain     (Consider location/radiation/quality/duration/timing/severity/associated sxs/prior Treatment) HPI Comments: Pt comes in with cc of abd pain. 65 y.o. male with medical history significant for ongoing alcoholism and tobacco abuse, acute diverticulitis initially hospitalized in May of this year with recent hospitalization early June for exacerbation of diverticulitis with possible colovesical fistula and a tiny microperforation. Pt reports that this past Friday he began having increasing left lower quadrant pain, poor intake for 3 days, increasing flatus and anorexia. He reports subjective fevers and chills and inability to sleep. He also reports emesis and most recently dry heaves. He reports he has yet to follow up with the surgery team as an outpatient because "I keep having these flares and have to come to the hospital".   ROS 10 Systems reviewed and are negative for acute change except as noted in the HPI.     Patient is a 65 y.o. male presenting with abdominal pain. The history is provided by the patient.  Abdominal Pain   Past Medical History  Diagnosis Date  . Blood in stool   . IBS (irritable bowel syndrome)   . Diverticulitis   . Hypertension   . Migraine     "none in a long time" (03/14/2016)   Past Surgical History  Procedure Laterality Date  . Colonoscopy    . Tonsillectomy     Family History  Problem Relation Age of Onset  . Stroke Mother   . Colon cancer Neg Hx   . Esophageal cancer Neg Hx   . Rectal cancer Neg Hx   . Stomach cancer Neg Hx    Social History  Substance Use Topics  . Smoking status: Current Every Day Smoker -- 1.00 packs/day for 40 years    Types: Cigarettes  . Smokeless tobacco: Never Used  . Alcohol Use: 4.8 oz/week    8 Cans of beer per week    Review of  Systems  Gastrointestinal: Positive for abdominal pain.      Allergies  Review of patient's allergies indicates no known allergies.  Home Medications   Prior to Admission medications   Medication Sig Start Date End Date Taking? Authorizing Provider  albuterol (PROVENTIL HFA;VENTOLIN HFA) 108 (90 BASE) MCG/ACT inhaler Inhale 2 puffs into the lungs every 6 (six) hours as needed for wheezing or shortness of breath. 07/17/15  Yes Sheliah Hatch, MD  aspirin EC 81 MG tablet Take 1 tablet (81 mg total) by mouth daily. 04/11/16  Yes Mauricio Annett Gula, MD  folic acid (FOLVITE) 1 MG tablet Take 1 tablet (1 mg total) by mouth daily. 03/08/16 03/08/17 Yes Sheliah Hatch, MD  LORazepam (ATIVAN) 1 MG tablet Take 1 tablet (1 mg total) by mouth daily. 04/19/16  Yes Sheliah Hatch, MD  Multiple Vitamin (MULTIVITAMIN WITH MINERALS) TABS tablet Take 1 tablet by mouth once a week.   Yes Historical Provider, MD  Simethicone (GAS-X PO) Take 1 tablet by mouth daily as needed (gas).   Yes Historical Provider, MD  thiamine (VITAMIN B-1) 100 MG tablet Take 100 mg by mouth daily.   Yes Historical Provider, MD  valsartan (DIOVAN) 160 MG tablet TAKE 1 TABLET (160 MG TOTAL) BY MOUTH DAILY. 10/27/15  Yes Sheliah Hatch, MD  ciprofloxacin (CIPRO) 500 MG tablet Take 1 tablet (  500 mg total) by mouth 2 (two) times daily. Patient not taking: Reported on 05/05/2016 04/11/16   Coralie Keens, MD  metroNIDAZOLE (FLAGYL) 500 MG tablet Take 1 tablet (500 mg total) by mouth 3 (three) times daily. Patient not taking: Reported on 05/05/2016 04/11/16   Coralie Keens, MD  mometasone (NASONEX) 50 MCG/ACT nasal spray Place 2 sprays into the nose daily. Patient not taking: Reported on 05/05/2016 07/17/15   Sheliah Hatch, MD   BP 162/92 mmHg  Pulse 97  Temp(Src) 98.3 F (36.8 C) (Oral)  Resp 18  Ht  (1.753 m)  Wt 162 lb (73.483 kg)  BMI 23.91 kg/m2  SpO2 97% Physical Exam  Constitutional: He  is oriented to person, place, and time. He appears well-developed.  HENT:  Head: Normocephalic and atraumatic.  Eyes: Conjunctivae and EOM are normal. Pupils are equal, round, and reactive to light.  Neck: Normal range of motion. Neck supple.  Cardiovascular: Normal rate, regular rhythm and normal heart sounds.   Pulmonary/Chest: Effort normal and breath sounds normal. No respiratory distress. He has no wheezes.  Abdominal: Soft. Bowel sounds are normal. He exhibits no distension. There is tenderness. There is guarding. There is no rebound.  Localized LLQ tenderness with guarding.  Neurological: He is alert and oriented to person, place, and time.  Skin: Skin is warm.    ED Course  Procedures (including critical care time) Labs Review Labs Reviewed  COMPREHENSIVE METABOLIC PANEL - Abnormal; Notable for the following:    CO2 21 (*)    BUN <5 (*)    AST 44 (*)    All other components within normal limits  URINALYSIS, ROUTINE W REFLEX MICROSCOPIC (NOT AT [facility]) - Abnormal; Notable for the following:    Color, Urine AMBER (*)    APPearance CLOUDY (*)    Ketones, ur 40 (*)    Protein, ur 30 (*)    Leukocytes, UA MODERATE (*)    All other components within normal limits  ETHANOL - Abnormal; Notable for the following:    Alcohol, Ethyl (B) 326 (*)    All other components within normal limits  URINE RAPID DRUG SCREEN, HOSP PERFORMED - Abnormal; Notable for the following:    Opiates POSITIVE (*)    All other components within normal limits  URINE MICROSCOPIC-ADD ON - Abnormal; Notable for the following:    Bacteria, UA FEW (*)    All other components within normal limits  MAGNESIUM - Abnormal; Notable for the following:    Magnesium 1.6 (*)    All other components within normal limits  COMPREHENSIVE METABOLIC PANEL - Abnormal; Notable for the following:    Calcium 8.8 (*)    Total Protein 6.2 (*)    Albumin 2.8 (*)    ALT 11 (*)    All other components within normal limits  URINE  CULTURE  LIPASE, BLOOD  CBC  PHOSPHORUS  CBC  PROTIME-INR  APTT  CBG MONITORING, ED    Imaging Review Dg Abd Acute W/chest  05/05/2016  CLINICAL DATA:  Abdominal pain with history of diverticulitis EXAM: DG ABDOMEN ACUTE W/ 1V CHEST COMPARISON:  Chest radiograph April 05, 2016; CT abdomen and pelvis April 04, 2016 FINDINGS: PA chest: No edema or consolidation. Heart size and pulmonary vascularity are normal. No adenopathy. Supine and upright abdomen: There are several loops of mildly dilated bowel with scattered air-fluid levels. No free air. There is a small phlebolith in the lower pelvis. Note that there is  present in the rectum. IMPRESSION: The bowel gas pattern raises question of ileus or enteritis. Early bowel obstruction is possible but felt to be somewhat less likely given the degree of air in the colon. No free air evident. No lung edema or consolidation. Electronically Signed   By: Bretta BangWilliam  Woodruff III M.D.   On: 05/05/2016 14:45   I have personally reviewed and evaluated these images and lab results as part of my medical decision-making.   EKG Interpretation None      MDM   Final diagnoses:  Acute diverticulitis    Pt comes in with cc of LLQ abd pain. Pt has localized tenderness with guarding. AAS ordered - free air r/o. Pt has possible enteritis per AAS. Clinical concerns for diverticulitis.  No diarrhea - no elevated WC - cdiff less likely. Zosyn started.   Derwood KaplanAnkit Sulayman Manning, MD 05/06/16 [phone]

## 2016-05-06 NOTE — Progress Notes (Signed)
PROGRESS NOTE                                                                                                                                                                                                             Patient Demographics:    Alvin Benton, is a 65 y.o. male, DOB - 1951-01-29, WUJ:[id number]  Admit date - 05/05/2016   Admitting Physician Joseph Art, DO  Outpatient Primary MD for the patient is Neena Rhymes, MD  LOS -   Chief Complaint  Patient presents with  . Abdominal Pain       Brief Narrative   65 y.o. male with  History of alcoholism, diverticulitis. He was recently discharged from the hospital., Presents with lower abdominal pain, x-ray significant for ileus, appears to be having DT this a.m..   Subjective:    Alvin Benton today has, No headache, No chest pain, Reports he is not feeling well, with significant tremors.  Assessment  & Plan :    Principal Problem:   Ileus (HCC) Active Problems:   Tobacco use disorder   Diverticulitis   Alcohol abuse   Chronic constipation   Colovesical fistula   Anxiety state   Systolic murmur  Ileus - Patient presents with abdominal pain, x-rays significant for possible ileus versus enteritis, afebrile, leukocytosis, benign abdominal exam, findings likely related to ileus, possibly from narcotics, will start on laxative regimen, continue nothing by mouth given his DTs, will repeat KUB in a.m.  Alcohol abuse with evidence of DTs - Patient with significant tremors, tachycardia this a.m. - On CIWA protocol, was changed to stepdown level CIWA protocol, will transfer to stepdown, more frequent monitoring and Ativan when necessary, will start on clonidine, will start on banana bag.  Tobacco use disorder - Counseled  History of Diverticulitis/Colovesical fistula -Recently completed a prolonged course of Cipro and Flagyl, afebrile, with no leukocytosis, will discontinue IV Zosyn  in 24 hours. -Urinalysis abnormal and with concerns over possible colovesical fistula on prior CT will check urine culture - Patient has surgical follow-up next week as an outpatient    Code Status : Full  Family Communication  : None at bedside  Disposition Plan  : Home once stable  Consults  :  None  Procedures  : None  DVT Prophylaxis  :  Lovenox   Lab Results  Component Value Date   PLT 235 05/06/2016    Antibiotics  :    Anti-infectives    Start     Dose/Rate Route Frequency Ordered Stop   05/05/16 1545  piperacillin-tazobactam (ZOSYN) IVPB 3.375 g     3.375 g 12.5 mL/hr over 240 Minutes Intravenous Every 8 hours 05/05/16 1532          Objective:   Filed Vitals:   05/05/16 1645 05/05/16 1715 05/05/16 2051 05/06/16 0702  BP: 122/79 133/75 138/126 162/92  Pulse: 97 103 97 97  Temp:  98.2 F (36.8 C) 98.4 F (36.9 C) 98.3 F (36.8 C)  TempSrc:  Oral Oral Oral  Resp:  18    Height:      Weight:      SpO2: 92% 95% 98% 97%    Wt Readings from Last 3 Encounters:  05/05/16 73.483 kg (162 lb)  04/19/16 76.885 kg (169 lb 8 oz)  04/05/16 80.287 kg (177 lb)    No intake or output data in the 24 hours ending 05/06/16 1119   Physical Exam  Awake Alert,Anxious, shaking . Supple Neck,No JVD,   Symmetrical Chest wall movement, Good air movement bilaterally, CTAB RRR,No Gallops,Rubs or new Murmurs, No Parasternal Heave +ve B.Sounds, Abd Soft, No tenderness,  No rebound - guarding or rigidity. No Cyanosis, Clubbing or edema, No new Rash or bruise  , significant tremors    Data Review:    CBC  Recent Labs Lab 05/05/16 1308 05/06/16 0421  WBC 9.5 6.9  HGB 16.8 13.4  HCT 48.8 40.6  PLT 375 235  MCV 95.3 95.8  MCH 32.8 31.6  MCHC 34.4 33.0  RDW 14.3 14.5    Chemistries   Recent Labs Lab 05/05/16 1308 05/05/16 1801 05/06/16 0421  NA 138  --  136  K 4.3  --  4.1  CL 103  --  105  CO2 21*  --  23  GLUCOSE 97  --  90  BUN <5*  --  6    CREATININE 0.71  --  0.62  CALCIUM 9.2  --  8.8*  MG  --  1.6*  --   AST 44*  --  33  ALT 17  --  11*  ALKPHOS 75  --  55  BILITOT 0.6  --  1.0   ------------------------------------------------------------------------------------------------------------------ No results for input(s): CHOL, HDL, LDLCALC, TRIG, CHOLHDL, LDLDIRECT in the last 72 hours.  No results found for: HGBA1C ------------------------------------------------------------------------------------------------------------------ No results for input(s): TSH, T4TOTAL, T3FREE, THYROIDAB in the last 72 hours.  Invalid input(s): FREET3 ------------------------------------------------------------------------------------------------------------------ No results for input(s): VITAMINB12, FOLATE, FERRITIN, TIBC, IRON, RETICCTPCT in the last 72 hours.  Coagulation profile  Recent Labs Lab 05/06/16 0421  INR 1.07    No results for input(s): DDIMER in the last 72 hours.  Cardiac Enzymes No results for input(s): CKMB, TROPONINI, MYOGLOBIN in the last 168 hours.  Invalid input(s): CK ------------------------------------------------------------------------------------------------------------------ No results found for: BNP  Inpatient Medications  Scheduled Meds: . bisacodyl  10 mg Rectal BID  . cloNIDine  0.1 mg Oral TID  . enoxaparin (LOVENOX) injection  40 mg Subcutaneous Q24H  . famotidine (PEPCID) IV  20 mg Intravenous Q12H  . folic acid  1 mg Oral Daily  . ketorolac  15 mg Intravenous Q6H  . multivitamin with minerals  1 tablet Oral Daily  . piperacillin-tazobactam (ZOSYN)  IV  3.375 g Intravenous Q8H  . pneumococcal 23 valent vaccine  0.5 mL Intramuscular Tomorrow-1000  .  polyethylene glycol  17 g Oral BID  . banana bag IV 1000 mL   Intravenous Once  . sodium chloride flush  3 mL Intravenous Q12H  . thiamine  100 mg Oral Daily   Or  . thiamine  100 mg Intravenous Daily   Continuous Infusions: . dextrose  5 % and 0.9 % NaCl with KCl 20 mEq/L     PRN Meds:.acetaminophen **OR** acetaminophen, albuterol, LORazepam, LORazepam **OR** LORazepam, LORazepam, morphine injection, ondansetron **OR** ondansetron (ZOFRAN) IV  Micro Results No results found for this or any previous visit (from the past 240 hour(s)).  Radiology Reports Dg Abd Acute W/chest  05/05/2016  CLINICAL DATA:  Abdominal pain with history of diverticulitis EXAM: DG ABDOMEN ACUTE W/ 1V CHEST COMPARISON:  Chest radiograph April 05, 2016; CT abdomen and pelvis April 04, 2016 FINDINGS: PA chest: No edema or consolidation. Heart size and pulmonary vascularity are normal. No adenopathy. Supine and upright abdomen: There are several loops of mildly dilated bowel with scattered air-fluid levels. No free air. There is a small phlebolith in the lower pelvis. Note that there is present in the rectum. IMPRESSION: The bowel gas pattern raises question of ileus or enteritis. Early bowel obstruction is possible but felt to be somewhat less likely given the degree of air in the colon. No free air evident. No lung edema or consolidation. Electronically Signed   By: Bretta BangWilliam  Woodruff III M.D.   On: 05/05/2016 14:45     Malyn Aytes M.D on 05/06/2016 at 11:19 AM  Between 7am to 7pm - Pager - [phone]  After 7pm go to [email or web address] - password [email or web address]  [facility] -  Office  [phone]

## 2016-05-06 NOTE — Progress Notes (Signed)
Rapid Response nurse in to assess pt. Pt less shakey at this time after receiving IV ativan. Bed will be available on 2C in about an hour. Will continue to monitor.

## 2016-05-06 NOTE — Telephone Encounter (Signed)
Per Dr. Beverely Lowabori in regards to recent Hospitalist note:  Please begin dismissal process as pt admitted to lying to me about his alcohol use in order to obtain a controlled substance.       KT, MD   ----- Message -----    From: Joseph ArtJessica U Vann, DO    Sent: 05/05/2016  5:51 PM     To: Sheliah HatchKatherine E Tabori, MD   Dismissal letter written, and paperwork forwarded to Med Records

## 2016-05-06 NOTE — Progress Notes (Signed)
Pt in room noted to be very shakey. Ativan administered as ordered. Hospitalist  In , see new orders for transfer to step down unit. Will continue to monitor.

## 2016-05-06 NOTE — Progress Notes (Signed)
05/06/2016 Patient transfer from [facility] to [employer] he at 1540. Patient was able  To answer questions he was aware of who he was, place and time. Patient arrive on unit with report from nurse when patient arrive on unit. It was reported patient iv came out, attempt to place a iv, but was not able to. Iv team was notified and made aware iv was needed. Patient receive CHG and MRSA swab. Patent is on a CIWA protocol. Patient have bruises on arms and heels dry. [facility] Healthcare[facility] Synia Douglass RN.

## 2016-05-07 ENCOUNTER — Telehealth: Payer: Self-pay | Admitting: Family Medicine

## 2016-05-07 ENCOUNTER — Inpatient Hospital Stay (HOSPITAL_COMMUNITY): Payer: Managed Care, Other (non HMO)

## 2016-05-07 LAB — CBC
HEMATOCRIT: 34.8 % — AB (ref 39.0–52.0)
HEMOGLOBIN: 11.6 g/dL — AB (ref 13.0–17.0)
MCH: 32.2 pg (ref 26.0–34.0)
MCHC: 33.3 g/dL (ref 30.0–36.0)
MCV: 96.7 fL (ref 78.0–100.0)
Platelets: 222 10*3/uL (ref 150–400)
RBC: 3.6 MIL/uL — ABNORMAL LOW (ref 4.22–5.81)
RDW: 14.4 % (ref 11.5–15.5)
WBC: 6.3 10*3/uL (ref 4.0–10.5)

## 2016-05-07 LAB — BASIC METABOLIC PANEL
Anion gap: 7 (ref 5–15)
BUN: 7 mg/dL (ref 6–20)
CALCIUM: 8.2 mg/dL — AB (ref 8.9–10.3)
CO2: 21 mmol/L — ABNORMAL LOW (ref 22–32)
CREATININE: 0.59 mg/dL — AB (ref 0.61–1.24)
Chloride: 106 mmol/L (ref 101–111)
GFR calc Af Amer: >60 mL/min (ref >60)
Glucose, Bld: 105 mg/dL — ABNORMAL HIGH (ref 65–99)
Potassium: 4.2 mmol/L (ref 3.5–5.1)
Sodium: 134 mmol/L — ABNORMAL LOW (ref 135–145)

## 2016-05-07 LAB — MAGNESIUM: Magnesium: 1.5 mg/dL — ABNORMAL LOW (ref 1.7–2.4)

## 2016-05-07 MED ORDER — MAGNESIUM SULFATE 2 GM/50ML IV SOLN
2.0000 g | Freq: Once | INTRAVENOUS | Status: AC
  Administered 2016-05-07: 2 g via INTRAVENOUS
  Filled 2016-05-07: qty 50

## 2016-05-07 MED ORDER — FAMOTIDINE 20 MG PO TABS
20.0000 mg | ORAL_TABLET | Freq: Two times a day (BID) | ORAL | Status: DC
  Administered 2016-05-07 – 2016-05-09 (×6): 20 mg via ORAL
  Filled 2016-05-07 (×6): qty 1

## 2016-05-07 NOTE — Telephone Encounter (Signed)
[facility] dismissed [facility] by Neena RhymesKatherine Tabori MD , effective May 06, 2016. Dismissal letter sent out by certified / registered mail. CLN   Received returned mail undeliverable. Re mailed first class 05/30/16 CN

## 2016-05-07 NOTE — Progress Notes (Signed)
PROGRESS NOTE                                                                                                                                                                                                             Patient Demographics:    Alvin Benton, is a 65 y.o. male, DOB - Jun 27, 1951, ZOX:[id number]  Admit date - 05/05/2016   Admitting Physician Alvin Art, DO  Outpatient Primary MD for the patient is Alvin Rhymes, MD  LOS - 1  Chief Complaint  Patient presents with  . Abdominal Pain       Brief Narrative   65 y.o. male with  History of alcoholism, diverticulitis. He was recently discharged from the hospital., Presents with lower abdominal pain, x-ray significant for ileus,As well patient with known history of alcohol abuse, was in DTs on 7/10, transfer to stepdown.   Subjective:    Alvin Benton today has, No headache, No chest pain, Reports he is feeling better today, denies nausea, abdominal pain, reports loose bowel movement.  Assessment  & Plan :    Principal Problem:   Ileus (HCC) Active Problems:   Tobacco use disorder   Diverticulitis   Alcohol abuse   Chronic constipation   Colovesical fistula   Anxiety state   Systolic murmur  Ileus - Patient presents with abdominal pain, x-rays significant for possible ileus versus enteritis, afebrile, no leukocytosis, benign abdominal exam, findings likely related to ileus, possibly from narcotics, started on laxative regimen, repeat KUB this a.m. showing persistent ileus, but patient denies nausea, vomiting, good bowel movement, so will start on full liquid diet and advance as tolerated.  Alcohol abuse with evidence of DTs - Patient with significant tremors, tachycardia on 7/11, transferred to stepdown for stepdown CIWA protocol, continue with thiamine, folic acid, started on clonidine, appears to be much better today, though more significantly subsided, will transfer to  telemetry, and change to MedSurg floor CIWA protocol.  Tobacco use disorder - Counseled  History of Diverticulitis/Colovesical fistula -Recently completed a prolonged course of Cipro and Flagyl, afebrile, with no leukocytosis, will discontinue IV Zosyn has no evidence of infection. -Urinalysis abnormal and with concerns over possible colovesical fistula on prior CT , urine culture growing yeast - Patient has surgical follow-up next week as an outpatient  Hypomagnesemia - Repleted  Code Status : Full  Family Communication  : None at bedside  Disposition Plan  : Home once stable hopefully on 1-2 days if tolerating advanced diet and no further DTs, pending PT consult  Consults  :  None  Procedures  : None  DVT Prophylaxis  :  Lovenox   Lab Results  Component Value Date   PLT 222 05/07/2016    Antibiotics  :    Anti-infectives    Start     Dose/Rate Route Frequency Ordered Stop   05/05/16 1545  piperacillin-tazobactam (ZOSYN) IVPB 3.375 g  Status:  Discontinued     3.375 g 12.5 mL/hr over 240 Minutes Intravenous Every 8 hours 05/05/16 1532 05/07/16 0715        Objective:   Filed Vitals:   05/07/16 0435 05/07/16 0732 05/07/16 0800 05/07/16 1144  BP: 150/75 136/83 151/95 125/82  Pulse: 51 59 82 61  Temp: 98.3 F (36.8 C) 97.8 F (36.6 C)    TempSrc: Oral Oral    Resp: 16 13  19   Height:      Weight:      SpO2: 100% 100%  100%    Wt Readings from Last 3 Encounters:  05/05/16 73.483 kg (162 lb)  04/19/16 76.885 kg (169 lb 8 oz)  04/05/16 80.287 kg (177 lb)     Intake/Output Summary (Last 24 hours) at 05/07/16 1212 Last data filed at 05/07/16 1145  Gross per 24 hour  Intake   1550 ml  Output    901 ml  Net    649 ml     Physical Exam  Awake Alert,Anxious, Supple Neck,No JVD,   Symmetrical Chest wall movement, Good air movement bilaterally, CTAB RRR,No Gallops,Rubs or new Murmurs, No Parasternal Heave +ve B.Sounds, Abd Soft, No tenderness,  No  rebound - guarding or rigidity. No Cyanosis, Clubbing or edema, No new Rash or bruise  , Still with tremors, but much improved    Data Review:    CBC  Recent Labs Lab 05/05/16 1308 05/06/16 0421 05/07/16 0552  WBC 9.5 6.9 6.3  HGB 16.8 13.4 11.6*  HCT 48.8 40.6 34.8*  PLT 375 235 222  MCV 95.3 95.8 96.7  MCH 32.8 31.6 32.2  MCHC 34.4 33.0 33.3  RDW 14.3 14.5 14.4    Chemistries   Recent Labs Lab 05/05/16 1308 05/05/16 1801 05/06/16 0421 05/06/16 1910 05/07/16 0552  NA 138  --  136 133* 134*  K 4.3  --  4.1 3.9 4.2  CL 103  --  105 102 106  CO2 21*  --  23 24 21*  GLUCOSE 97  --  90 109* 105*  BUN <5*  --  6 10 7   CREATININE 0.71  --  0.62 0.67 0.59*  CALCIUM 9.2  --  8.8* 8.5* 8.2*  MG  --  1.6*  --   --  1.5*  AST 44*  --  33  --   --   ALT 17  --  11*  --   --   ALKPHOS 75  --  55  --   --   BILITOT 0.6  --  1.0  --   --    ------------------------------------------------------------------------------------------------------------------ No results for input(s): CHOL, HDL, LDLCALC, TRIG, CHOLHDL, LDLDIRECT in the last 72 hours.  No results found for: HGBA1C ------------------------------------------------------------------------------------------------------------------ No results for input(s): TSH, T4TOTAL, T3FREE, THYROIDAB in the last 72 hours.  Invalid input(s): FREET3 ------------------------------------------------------------------------------------------------------------------ No results for input(s): VITAMINB12, FOLATE, FERRITIN, TIBC, IRON, RETICCTPCT in the last 72  hours.  Coagulation profile  Recent Labs Lab 05/06/16 0421  INR 1.07    No results for input(s): DDIMER in the last 72 hours.  Cardiac Enzymes No results for input(s): CKMB, TROPONINI, MYOGLOBIN in the last 168 hours.  Invalid input(s): CK ------------------------------------------------------------------------------------------------------------------ No results found  for: BNP  Inpatient Medications  Scheduled Meds: . bisacodyl  10 mg Rectal BID  . cloNIDine  0.1 mg Oral TID  . enoxaparin (LOVENOX) injection  40 mg Subcutaneous Q24H  . famotidine  20 mg Oral BID  . folic acid  1 mg Oral Daily  . ketorolac  15 mg Intravenous Q6H  . multivitamin with minerals  1 tablet Oral Daily  . polyethylene glycol  17 g Oral BID  . sodium chloride flush  3 mL Intravenous Q12H  . thiamine  100 mg Oral Daily   Continuous Infusions: . dextrose 5 % and 0.9 % NaCl with KCl 20 mEq/L 125 mL/hr at 05/07/16 0948   PRN Meds:.acetaminophen **OR** acetaminophen, albuterol, LORazepam, LORazepam **OR** LORazepam, LORazepam, morphine injection, ondansetron **OR** ondansetron (ZOFRAN) IV  Micro Results Recent Results (from the past 240 hour(s))  Urine culture     Status: Abnormal   Collection Time: 05/05/16  3:13 PM  Result Value Ref Range Status   Specimen Description URINE, CLEAN CATCH  Final   Special Requests NONE  Final   Culture >=100,000 COLONIES/mL YEAST (A)  Final   Report Status 05/06/2016 FINAL  Final  MRSA PCR Screening     Status: None   Collection Time: 05/06/16  2:37 PM  Result Value Ref Range Status   MRSA by PCR NEGATIVE NEGATIVE Final    Comment:        The GeneXpert MRSA Assay (FDA approved for NASAL specimens only), is one component of a comprehensive MRSA colonization surveillance program. It is not intended to diagnose MRSA infection nor to guide or monitor treatment for MRSA infections.     Radiology Reports Dg Abd Acute W/chest  05/05/2016  CLINICAL DATA:  Abdominal pain with history of diverticulitis EXAM: DG ABDOMEN ACUTE W/ 1V CHEST COMPARISON:  Chest radiograph April 05, 2016; CT abdomen and pelvis April 04, 2016 FINDINGS: PA chest: No edema or consolidation. Heart size and pulmonary vascularity are normal. No adenopathy. Supine and upright abdomen: There are several loops of mildly dilated bowel with scattered air-fluid levels. No free  air. There is a small phlebolith in the lower pelvis. Note that there is present in the rectum. IMPRESSION: The bowel gas pattern raises question of ileus or enteritis. Early bowel obstruction is possible but felt to be somewhat less likely given the degree of air in the colon. No free air evident. No lung edema or consolidation. Electronically Signed   By: Bretta Bang III M.D.   On: 05/05/2016 14:45   Dg Abd Portable 1v  05/07/2016  CLINICAL DATA:  Abdominal discomfort, ileus, history of diverticulitis and irritable bowel syndrome EXAM: PORTABLE ABDOMEN - 1 VIEW COMPARISON:  Abdominal series of May 05, 2016 FINDINGS: The volume of small and large bowel gas has decreased somewhat. This is a supine image and [place] air-fluid levels are not demonstrated. No free extraluminal gas collections are observed. The rectum is not included in the field of view. IMPRESSION: Mild interval decrease in the volume of small and large bowel gas. There remain loops of normal calibered gas-filled small bowel to the left of midline consistent with ileus. Electronically Signed   By: David  [place] M.D.  On: 05/07/2016 07:43     ELGERGAWY, DAWOOD M.D on 05/07/2016 at 12:12 PM  Between 7am to 7pm - Pager - [phone]  After 7pm go to [email or web address] - password [email or web address]  [facility] -  Office  [phone]

## 2016-05-07 NOTE — Progress Notes (Signed)
CSW received consult regarding ETOH use. CSW completed SBIRT with patient. Patient stated that he did not have "dt's" but that it was "anxiety and that not everyone is an alcoholic". He stated he had a little to drink on the day he came to the hospital but that alcohol is not a problem for him and he never misses work or anything. He states he would be able to stop drinking if he were able to get the pain medicines he wants. Patient refused ETOH resources and stated that he still had the resources the hospital gave him last admission.  CSW signing off.  [name] Veronia Laprise LCSWA [phone]

## 2016-05-07 NOTE — Evaluation (Signed)
Physical Therapy Evaluation Patient Details Name: Alvin Benton MRN: [id number] DOB: October 08, 1951 Today's Date: 05/07/2016   History of Present Illness  65 y.o. male with History of alcoholism, diverticulitis. He was recently discharged from the hospital., Presents with lower abdominal pain, x-ray significant for ileus,As well patient with known history of alcohol abuse, was in DTs on 7/10, transfer to stepdown.  Clinical Impression  Patient presents with decreased mobility mainly due to imbalance and decreased safety awareness.  Feel he will progress with acute level therapies to be able to d/c home without PT follow up.  PT to follow acutely until d/c.  STRONGLY recommend nursing staff to assist pt to walk in hallways once per shift.      Follow Up Recommendations No PT follow up    Equipment Recommendations  None recommended by PT    Recommendations for Other Services       Precautions / Restrictions Precautions Precautions: Fall      Mobility  Bed Mobility Overal bed mobility: Modified Independent                Transfers Overall transfer level: Needs assistance   Transfers: Sit to/from Stand Sit to Stand: Supervision         General transfer comment: for safety with lines and balance  Ambulation/Gait Ambulation/Gait assistance: Min guard;Supervision Ambulation Distance (Feet): 500 Feet (x 2) Assistive device: None Gait Pattern/deviations: Step-through pattern;Decreased stride length;Wide base of support;Drifts right/left     General Gait Details: veers to L when looking R and hit wall turning corner to R once, wide base with increased time on turns, but noted improvements in gait quality and speed second bout.   Stairs            Wheelchair Mobility    Modified Rankin (Stroke Patients Only)       Balance Overall balance assessment: Needs assistance   Sitting balance-Leahy Scale: Good     Standing balance support: No upper extremity  supported Standing balance-Leahy Scale: Good                   Standardized Balance Assessment Standardized Balance Assessment : Dynamic Gait Index   Dynamic Gait Index Level Surface: Mild Impairment Change in Gait Speed: Normal Gait with Horizontal Head Turns: Mild Impairment Gait with Vertical Head Turns: Normal Gait and Pivot Turn: Mild Impairment Step Over Obstacle: Mild Impairment Step Around Obstacles: Mild Impairment Steps:  (NT due to lines)       Pertinent Vitals/Pain Pain Assessment: 0-10 Pain Score: 2  Pain Location: back sorness Pain Descriptors / Indicators: Aching Pain Intervention(s): Monitored during session;Repositioned    Home Living Family/patient expects to be discharged to:: Private residence Living Arrangements: Alone   Type of Home: Apartment Home Access: Stairs to enter Entrance Stairs-Rails: Right Entrance Stairs-Number of Steps: 12 Home Layout: One level Home Equipment: None      Prior Function Level of Independence: Independent               Hand Dominance        Extremity/Trunk Assessment   Upper Extremity Assessment: Overall WFL for tasks assessed (some tremor noted with holding lines)           Lower Extremity Assessment: Overall WFL for tasks assessed         Communication   Communication: No difficulties  Cognition Arousal/Alertness: Awake/alert Behavior During Therapy: Impulsive Overall Cognitive Status: No family/caregiver present to determine baseline cognitive functioning (some decreased safety awareness)  General Comments      Exercises        Assessment/Plan    PT Assessment Patient needs continued PT services  PT Diagnosis Abnormality of gait   PT Problem List Decreased safety awareness;Decreased activity tolerance;Decreased balance;Decreased mobility;Decreased coordination  PT Treatment Interventions DME instruction;Balance training;Gait training;Functional  mobility training;Stair training;Patient/family education;Therapeutic activities;Therapeutic exercise   PT Goals (Current goals can be found in the Care Plan section) Acute Rehab PT Goals Patient Stated Goal: To go home PT Goal Formulation: With patient Time For Goal Achievement: 05/14/16 Potential to Achieve Goals: Good    Frequency Min 3X/week   Barriers to discharge Decreased caregiver support      Co-evaluation               End of Session Equipment Utilized During Treatment: Gait belt Activity Tolerance: Patient tolerated treatment well Patient left: in chair;with call bell/phone within reach;with chair alarm set           Time: 1206-1226 PT Time Calculation (min) (ACUTE ONLY): 20 min   Charges:   PT Evaluation $PT Eval Moderate Complexity: 1 Procedure     PT G CodesElray Benton:        Alvin Benton 05/07/2016, 1:34 PM  Sheran Lawlessyndi Tynan Boesel, PT [phone] 05/07/2016

## 2016-05-07 NOTE — Care Management Note (Signed)
Case Management Note  Patient Details  Name: Alvin Benton MRN: [id number] Date of Birth: 1951/10/17  Subjective/Objective:                 Pt from home, self care. Has PCP and insurance. No PT follow up reccommended.    Action/Plan:  CM will follow for DC planning needs as they arise.  Expected Discharge Date:                  Expected Discharge Plan:  Home/Self Care  In-House Referral:     Discharge planning Services  CM Consult  Post Acute Care Choice:    Choice offered to:     DME Arranged:    DME Agency:     HH Arranged:    HH Agency:     Status of Service:  Completed, signed off  If discussed at [employer] Length of Stay Meetings, dates discussed:    Additional Comments:  Alvin SabalDebbie Najae Rathert, RN 05/07/2016, 5:14 PM

## 2016-05-08 DIAGNOSIS — F1010 Alcohol abuse, uncomplicated: Secondary | ICD-10-CM

## 2016-05-08 DIAGNOSIS — K567 Ileus, unspecified: Principal | ICD-10-CM

## 2016-05-08 DIAGNOSIS — F17200 Nicotine dependence, unspecified, uncomplicated: Secondary | ICD-10-CM

## 2016-05-08 DIAGNOSIS — I10 Essential (primary) hypertension: Secondary | ICD-10-CM

## 2016-05-08 LAB — BASIC METABOLIC PANEL
Anion gap: 8 (ref 5–15)
BUN: <5 mg/dL — ABNORMAL LOW (ref 6–20)
CALCIUM: 8.4 mg/dL — AB (ref 8.9–10.3)
CHLORIDE: 108 mmol/L (ref 101–111)
CO2: 20 mmol/L — AB (ref 22–32)
CREATININE: 0.5 mg/dL — AB (ref 0.61–1.24)
GFR calc non Af Amer: >60 mL/min (ref >60)
GLUCOSE: 107 mg/dL — AB (ref 65–99)
Potassium: 3.7 mmol/L (ref 3.5–5.1)
Sodium: 136 mmol/L (ref 135–145)

## 2016-05-08 LAB — CBC
HEMATOCRIT: 37.3 % — AB (ref 39.0–52.0)
HEMOGLOBIN: 12 g/dL — AB (ref 13.0–17.0)
MCH: 31.3 pg (ref 26.0–34.0)
MCHC: 32.2 g/dL (ref 30.0–36.0)
MCV: 97.1 fL (ref 78.0–100.0)
Platelets: 234 10*3/uL (ref 150–400)
RBC: 3.84 MIL/uL — ABNORMAL LOW (ref 4.22–5.81)
RDW: 14.4 % (ref 11.5–15.5)
WBC: 6.3 10*3/uL (ref 4.0–10.5)

## 2016-05-08 LAB — MAGNESIUM: Magnesium: 1.7 mg/dL (ref 1.7–2.4)

## 2016-05-08 MED ORDER — POTASSIUM CHLORIDE CRYS ER 20 MEQ PO TBCR
40.0000 meq | EXTENDED_RELEASE_TABLET | Freq: Once | ORAL | Status: AC
  Administered 2016-05-08: 40 meq via ORAL
  Filled 2016-05-08: qty 2

## 2016-05-08 MED ORDER — IRBESARTAN 300 MG PO TABS: 150.0000 mg | ORAL_TABLET | Freq: Every day | ORAL | Status: DC

## 2016-05-08 MED ORDER — MAGNESIUM SULFATE 50 % IJ SOLN
3.0000 g | Freq: Once | INTRAVENOUS | Status: AC
  Administered 2016-05-08: 3 g via INTRAVENOUS
  Filled 2016-05-08: qty 6

## 2016-05-08 MED ORDER — NICOTINE 21 MG/24HR TD PT24
21.0000 mg | MEDICATED_PATCH | Freq: Every day | TRANSDERMAL | Status: DC
Start: 2016-05-08 — End: 2016-05-09
  Administered 2016-05-08 – 2016-05-09 (×2): 21 mg via TRANSDERMAL
  Filled 2016-05-08 (×2): qty 1

## 2016-05-08 MED ORDER — AMLODIPINE BESYLATE 5 MG PO TABS
5.0000 mg | ORAL_TABLET | Freq: Every day | ORAL | Status: DC
  Administered 2016-05-08: 5 mg via ORAL
  Filled 2016-05-08: qty 1

## 2016-05-08 MED ORDER — IRBESARTAN 300 MG PO TABS
150.0000 mg | ORAL_TABLET | Freq: Every day | ORAL | Status: DC
  Administered 2016-05-08 – 2016-05-09 (×2): 150 mg via ORAL
  Filled 2016-05-08 (×2): qty 1

## 2016-05-08 NOTE — Progress Notes (Signed)
PROGRESS NOTE    Alvin Benton [place]  WUJ:[id number] DOB: [birth year] DOA: 05/05/2016 PCP: Neena RhymesKatherine Tabori, MD    Brief Narrative:  65 y.o. male with History of alcoholism, diverticulitis. He was recently discharged from the hospital., Presents with lower abdominal pain, x-ray significant for ileus,As well patient with known history of alcohol abuse, was in DTs on 7/10, transfer to stepdown.   Assessment & Plan:   Principal Problem:   Ileus (HCC) Active Problems:   Tobacco use disorder   Diverticulitis   Alcohol abuse   Chronic constipation   Colovesical fistula   Anxiety state   Systolic murmur  Ileus - Patient presented with abdominal pain, x-rays significant for possible ileus versus enteritis, afebrile, no leukocytosis, benign abdominal exam, findings likely related to ileus, possibly from narcotics, started on laxative regimen, repeat KUB 05/07/2016. showing persistent ileus, but patient denies nausea, vomiting, good bowel movement. Patient with complaints of loose stools. Discontinue MiraLAX. Patient tolerating full liquid diet. Will advance to a soft diet.   Alcohol abuse with evidence of DTs - Patient with significant tremors, tachycardia on 7/11, transferred to stepdown for stepdown CIWA protocol, continue with thiamine, folic acid, started on clonidine, appears to be much better yesterday. Patient improving clinically. Will discontinue clonidine. Continue Ativan CIWA protocol.   Tobacco use disorder - Counseled. Place on a nicotine patch.  History of Diverticulitis/Colovesical fistula -Recently completed a prolonged course of Cipro and Flagyl, afebrile, with no leukocytosis, discontinued IV Zosyn as no evidence of infection. -Urinalysis abnormal and with concerns over possible colovesical fistula on prior CT , urine culture growing yeast - Patient has surgical follow-up next week as an outpatient  Hypomagnesemia - Secondary to GI losses.  Replete.  Hypertension Discontinue clonidine. Resume patient's home dose ARB. Add low-dose Norvasc.   DVT prophylaxis: Lovenox. Code Status: Full Family Communication: Updated patient. No family at bedside. Disposition Plan: Home when tolerating oral intake. Hopefully tomorrow.   Consultants:   None  Procedures:   Acute abdominal series 05/05/2016, 05/07/2016  Antimicrobials:   None   Subjective: Patient denies any chest pain. No shortness of breath. No nausea. No vomiting. No abdominal pain. Patient states has been having multiple loose stools. Positive flatus. Tolerating current diet.  Objective: Filed Vitals:   05/07/16 2300 05/08/16 0531 05/08/16 0700 05/08/16 1232  BP: 149/64 178/85 174/66 177/67  Pulse:  58 47 53  Temp: 98 F (36.7 C) 97.5 F (36.4 C)  97.9 F (36.6 C)  TempSrc: Oral Oral  Oral  Resp: 16 21  16   Height:      Weight:      SpO2: 99% 100% 100% 100%    Intake/Output Summary (Last 24 hours) at 05/08/16 1306 Last data filed at 05/08/16 0811  Gross per 24 hour  Intake    250 ml  Output   1050 ml  Net   -800 ml   Filed Weights   05/05/16 1300 05/07/16 1505  Weight: 73.483 kg (162 lb) 75.479 kg (166 lb 6.4 oz)    Examination:  General exam: Appears calm and comfortable  Respiratory system: Clear to auscultation. Respiratory effort normal. Cardiovascular system: S1 & S2 heard, RRR. No JVD, murmurs, rubs, gallops or clicks. No pedal edema. Gastrointestinal system: Abdomen is nondistended, soft and nontender. No organomegaly or masses felt. Normal bowel sounds heard. Central nervous system: Alert and oriented. No focal neurological deficits. Extremities: Symmetric 5 x 5 power. Skin: No rashes, lesions or ulcers Psychiatry: Judgement and insight appear normal. Mood &  affect appropriate.     Data Reviewed: I have personally reviewed following labs and imaging studies  CBC:  Recent Labs Lab 05/05/16 1308 05/06/16 0421 05/07/16 0552  05/08/16 0534  WBC 9.5 6.9 6.3 6.3  HGB 16.8 13.4 11.6* 12.0*  HCT 48.8 40.6 34.8* 37.3*  MCV 95.3 95.8 96.7 97.1  PLT 375 235 222 234   Basic Metabolic Panel:  Recent Labs Lab 05/05/16 1308 05/05/16 1801 05/06/16 0421 05/06/16 1910 05/07/16 0552 05/08/16 0534  NA 138  --  136 133* 134* 136  K 4.3  --  4.1 3.9 4.2 3.7  CL 103  --  105 102 106 108  CO2 21*  --  23 24 21* 20*  GLUCOSE 97  --  90 109* 105* 107*  BUN <5*  --  6 10 7  <5*  CREATININE 0.71  --  0.62 0.67 0.59* 0.50*  CALCIUM 9.2  --  8.8* 8.5* 8.2* 8.4*  MG  --  1.6*  --   --  1.5* 1.7  PHOS  --  3.3  --   --   --   --    GFR: Estimated Creatinine Clearance: 94.5 mL/min (by C-G formula based on Cr of 0.5). Liver Function Tests:  Recent Labs Lab 05/05/16 1308 05/06/16 0421  AST 44* 33  ALT 17 11*  ALKPHOS 75 55  BILITOT 0.6 1.0  PROT 8.0 6.2*  ALBUMIN 3.8 2.8*    Recent Labs Lab 05/05/16 1308  LIPASE 50   No results for input(s): AMMONIA in the last 168 hours. Coagulation Profile:  Recent Labs Lab 05/06/16 0421  INR 1.07   Cardiac Enzymes: No results for input(s): CKTOTAL, CKMB, CKMBINDEX, TROPONINI in the last 168 hours. BNP (last 3 results) No results for input(s): PROBNP in the last 8760 hours. HbA1C: No results for input(s): HGBA1C in the last 72 hours. CBG:  Recent Labs Lab 05/05/16 1258  GLUCAP 92   Lipid Profile: No results for input(s): CHOL, HDL, LDLCALC, TRIG, CHOLHDL, LDLDIRECT in the last 72 hours. Thyroid Function Tests: No results for input(s): TSH, T4TOTAL, FREET4, T3FREE, THYROIDAB in the last 72 hours. Anemia Panel: No results for input(s): VITAMINB12, FOLATE, FERRITIN, TIBC, IRON, RETICCTPCT in the last 72 hours. Sepsis Labs: No results for input(s): PROCALCITON, LATICACIDVEN in the last 168 hours.  Recent Results (from the past 240 hour(s))  Urine culture     Status: Abnormal   Collection Time: 05/05/16  3:13 PM  Result Value Ref Range Status   Specimen  Description URINE, CLEAN CATCH  Final   Special Requests NONE  Final   Culture >=100,000 COLONIES/mL YEAST (A)  Final   Report Status 05/06/2016 FINAL  Final  MRSA PCR Screening     Status: None   Collection Time: 05/06/16  2:37 PM  Result Value Ref Range Status   MRSA by PCR NEGATIVE NEGATIVE Final    Comment:        The GeneXpert MRSA Assay (FDA approved for NASAL specimens only), is one component of a comprehensive MRSA colonization surveillance program. It is not intended to diagnose MRSA infection nor to guide or monitor treatment for MRSA infections.          Radiology Studies: Dg Abd Portable 1v  05/07/2016  CLINICAL DATA:  Abdominal discomfort, ileus, history of diverticulitis and irritable bowel syndrome EXAM: PORTABLE ABDOMEN - 1 VIEW COMPARISON:  Abdominal series of May 05, 2016 FINDINGS: The volume of small and large bowel gas has decreased somewhat.  This is a supine image and [place] air-fluid levels are not demonstrated. No free extraluminal gas collections are observed. The rectum is not included in the field of view. IMPRESSION: Mild interval decrease in the volume of small and large bowel gas. There remain loops of normal calibered gas-filled small bowel to the left of midline consistent with ileus. Electronically Signed   By: David  [place] M.D.   On: 05/07/2016 07:43        Scheduled Meds: . amLODipine  5 mg Oral Daily  . enoxaparin (LOVENOX) injection  40 mg Subcutaneous Q24H  . famotidine  20 mg Oral BID  . folic acid  1 mg Oral Daily  . irbesartan  150 mg Oral Daily  . ketorolac  15 mg Intravenous Q6H  . magnesium sulfate 1 - 4 g bolus IVPB  3 g Intravenous Once  . multivitamin with minerals  1 tablet Oral Daily  . potassium chloride  40 mEq Oral Once  . sodium chloride flush  3 mL Intravenous Q12H  . thiamine  100 mg Oral Daily   Continuous Infusions:    LOS: 2 days    Time spent: 35 minutes    Elihu Milstein, MD [facility] Pager  [phone]  If 7PM-7AM, please contact night-coverage [email or web address] Password [email or web address] 05/08/2016, 1:06 PM

## 2016-05-08 NOTE — Progress Notes (Signed)
Pt B/P elevated, MD notified at 0700. New orders placed.

## 2016-05-08 NOTE — Progress Notes (Signed)
Physical Therapy Treatment Patient Details Name: Alvin Benton MRN: [id number] DOB: 01-15-1951 Today's Date: 05/08/2016    History of Present Illness 65 y.o. male with History of alcoholism, diverticulitis. He was recently discharged from the hospital., Presents with lower abdominal pain, x-ray significant for ileus,As well patient with known history of alcohol abuse, was in DTs on 7/10, transfer to stepdown.    PT Comments    Patient is progressing toward mobility goals. Scored 19 on DGI this session. Stair training complete. Overall min guard/supervision for safety. Current plan remains appropriate.   Follow Up Recommendations  No PT follow up     Equipment Recommendations  None recommended by PT    Recommendations for Other Services       Precautions / Restrictions Precautions Precautions: Fall Restrictions Weight Bearing Restrictions: No    Mobility  Bed Mobility Overal bed mobility: Modified Independent                Transfers Overall transfer level: Needs assistance Equipment used: None Transfers: Sit to/from Stand Sit to Stand: Supervision         General transfer comment: supervision for safety  Ambulation/Gait Ambulation/Gait assistance: Supervision Ambulation Distance (Feet): 400 Feet Assistive device: None (pushed IV pole ) Gait Pattern/deviations: Step-through pattern;Wide base of support     General Gait Details: pt with improved balance this session; supervision for safety by with steady gait    Stairs Stairs: Yes Stairs assistance: Min guard (supervision second bout) Stair Management: Two rails;Forwards Number of Stairs: 10 (5X2 due to lines) General stair comments: educated on sequencing and cues for safety; pt reported slight dizziness after stari descend with VSS  Wheelchair Mobility    Modified Rankin (Stroke Patients Only)       Balance Overall balance assessment: Needs assistance   Sitting balance-Leahy Scale: Good        Standing balance-Leahy Scale: Good       Tandem Stance - Right Leg:  (< 30 seconds) Tandem Stance - Left Leg:  (~30 seconds)            Cognition Arousal/Alertness: Awake/alert Behavior During Therapy: WFL for tasks assessed/performed Overall Cognitive Status: Within Functional Limits for tasks assessed                      Exercises General Exercises - Lower Extremity Long Arc Quad: Strengthening;Both;20 reps;Seated Hip Flexion/Marching: Strengthening;Both;20 reps;Seated Mini-Sqauts: Strengthening;Both;20 reps;Seated    General Comments        Pertinent Vitals/Pain Pain Assessment: No/denies pain    Home Living                      Prior Function            PT Goals (current goals can now be found in the care plan section) Acute Rehab PT Goals Patient Stated Goal: To go home Progress towards PT goals: Progressing toward goals    Frequency  Min 3X/week    PT Plan Current plan remains appropriate    Co-evaluation             End of Session Equipment Utilized During Treatment: Gait belt Activity Tolerance: Patient tolerated treatment well Patient left: in chair;with call bell/phone within reach     Time: [id number] PT Time Calculation (min) (ACUTE ONLY): 21 min  Charges:  $Gait Training: 8-22 mins                    G  Codes:      Derek Mound, PTA Pager: [phone]   05/08/2016, 3:36 PM

## 2016-05-09 LAB — BASIC METABOLIC PANEL
Anion gap: 8 (ref 5–15)
CALCIUM: 8.6 mg/dL — AB (ref 8.9–10.3)
CO2: 22 mmol/L (ref 22–32)
CREATININE: 0.49 mg/dL — AB (ref 0.61–1.24)
Chloride: 106 mmol/L (ref 101–111)
Glucose, Bld: 98 mg/dL (ref 65–99)
Potassium: 3.8 mmol/L (ref 3.5–5.1)
SODIUM: 136 mmol/L (ref 135–145)

## 2016-05-09 LAB — MAGNESIUM: MAGNESIUM: 1.9 mg/dL (ref 1.7–2.4)

## 2016-05-09 MED ORDER — FAMOTIDINE 20 MG PO TABS: 20.0000 mg | ORAL_TABLET | Freq: Two times a day (BID) | ORAL | Status: DC

## 2016-05-09 MED ORDER — AMLODIPINE BESYLATE 10 MG PO TABS
10.0000 mg | ORAL_TABLET | Freq: Every day | ORAL | Status: DC
  Administered 2016-05-09: 10 mg via ORAL
  Filled 2016-05-09: qty 1

## 2016-05-09 MED ORDER — AMLODIPINE BESYLATE 10 MG PO TABS: 10.0000 mg | ORAL_TABLET | Freq: Every day | ORAL | Status: DC

## 2016-05-09 MED ORDER — ADULT MULTIVITAMIN W/MINERALS CH: 1.0000 | ORAL_TABLET | Freq: Every day | ORAL | Status: DC

## 2016-05-09 MED ORDER — NICOTINE 21 MG/24HR TD PT24: 21.0000 mg | MEDICATED_PATCH | Freq: Every day | TRANSDERMAL | Status: DC

## 2016-05-09 MED ORDER — ENSURE ENLIVE PO LIQD: 237.0000 mL | Freq: Two times a day (BID) | ORAL | Status: DC

## 2016-05-09 NOTE — Progress Notes (Signed)
Nutrition Follow-up  DOCUMENTATION CODES:   Not applicable  INTERVENTION:   -Ensure Enlive po BID, each supplement provides 350 kcal and 20 grams of protein  NUTRITION DIAGNOSIS:   Inadequate oral intake related to altered GI function as evidenced by meal completion < 25%.  Progressing  GOAL:   Patient will meet greater than or equal to 90% of their needs  Progressing  MONITOR:   Weight trends, Labs, I & O's, Diet advancement  REASON FOR ASSESSMENT:   Malnutrition Screening Tool    ASSESSMENT:   65 y.o. male with medical history significant for ongoing alcoholism and tobacco abuse, acute diverticulitis initially hospitalized in May of this year with recent hospitalization early June for exacerbation of diverticulitis with possible colovesical fistula and a tiny microperforation. Pt reports that this past Friday he began having increasing left lower quadrant pain, poor intake for 3 days, increasing flatus and anorexia. He reports subjective fevers and chills and inability to sleep. He also reports emesis and most recently dry heaves.  Pt transferred from SDU to medical floor on 05/06/16.   Pt was advanced to a full liquid diet on 05/07/16. Per flowsheets, intake has been poor (PO: 0%). RD will add Ensure supplements to assist with nutritional intake (pt also consumes at home).   Per MD notes, discharge home soon, once pt is able to tolerate PO diet.   Labs reviewed.   Diet Order:  DIET SOFT Room service appropriate?: Yes; Fluid consistency:: Thin  Skin:  Reviewed, no issues  Last BM:  05/08/16  Height:   Ht Readings from Last 1 Encounters:  05/07/16 5\' 9"  (1.753 m)    Weight:   Wt Readings from Last 1 Encounters:  05/07/16 166 lb 6.4 oz (75.479 kg)    Ideal Body Weight:  72.7 kg  BMI:  Body mass index is 24.56 kg/(m^2).  Estimated Nutritional Needs:   Kcal:  1950-2150  Protein:  90-100 grams  Fluid:  1.9 - 2.1 L/day  EDUCATION NEEDS:   No  education needs identified at this time  Broly Hatfield A. Mayford KnifeWilliams, RD, LDN, CDE Pager: [phone] After hours Pager: [phone]

## 2016-05-09 NOTE — Progress Notes (Signed)
Physical Therapy Treatment Patient Details Name: Alvin Benton MRN: [id number] DOB: November 08, 1950 Today's Date: 05/09/2016    History of Present Illness 65 y.o. male with History of alcoholism, diverticulitis. He was recently discharged from the hospital., Presents with lower abdominal pain, x-ray significant for ileus,As well patient with known history of alcohol abuse, was in DTs on 7/10, transfer to stepdown.    PT Comments    Pt appears back to baseline and performing all mobility with mod-I to Independent level.  Pt does not have order but reports he will go home today.  Will address d/c from services if patient does not d/c today.    Follow Up Recommendations  No PT follow up     Equipment Recommendations  None recommended by PT    Recommendations for Other Services       Precautions / Restrictions Precautions Precautions: Fall Restrictions Weight Bearing Restrictions: No    Mobility  Bed Mobility Overal bed mobility: Modified Independent                Transfers Overall transfer level: Modified independent     Sit to Stand: Modified independent (Device/Increase time)         General transfer comment: Good technique.    Ambulation/Gait Ambulation/Gait assistance: Independent Ambulation Distance (Feet): 700 Feet Assistive device: None Gait Pattern/deviations: Step-through pattern;WFL(Within Functional Limits)     General Gait Details: Pt performed with series of quick stops and starts, head turns and cervical flexion/ext.  NO LOB noted or gait deviations.     Stairs Stairs: Yes Stairs assistance: Modified independent (Device/Increase time) Stair Management: One rail Right Number of Stairs: 10 General stair comments: Performed with good technique and carryover from last session, no dizziness noted.    Wheelchair Mobility    Modified Rankin (Stroke Patients Only)       Balance     Sitting balance-Leahy Scale: Normal       Standing  balance-Leahy Scale: Normal                      Cognition Arousal/Alertness: Awake/alert Behavior During Therapy: WFL for tasks assessed/performed Overall Cognitive Status: Within Functional Limits for tasks assessed                      Exercises      General Comments        Pertinent Vitals/Pain Pain Assessment: No/denies pain    Home Living                      Prior Function            PT Goals (current goals can now be found in the care plan section) Acute Rehab PT Goals Patient Stated Goal: To go home Potential to Achieve Goals: Good Progress towards PT goals: Goals met/education completed, patient discharged from PT    Frequency       PT Plan  (Pt ready to d/c from PT services at this time.  )    Co-evaluation             End of Session   Activity Tolerance: Patient tolerated treatment well Patient left:  (standing in room packing belongings.  )     Time: 1419-1430 PT Time Calculation (min) (ACUTE ONLY): 11 min  Charges:  $Gait Training: 8-22 mins                    G  Codes:      Cristela Blue 05/09/2016, 2:40 PM  Governor Rooks, PTA pager [phone]

## 2016-05-09 NOTE — Discharge Summary (Signed)
Physician Discharge Summary  Alvin Benton RUE:[id number] DOB: 02/02/1951 DOA: 05/05/2016  PCP: Alvin Rhymes, MD  Admit date: 05/05/2016 Discharge date: 05/09/2016  Time spent: 65 minutes  Recommendations for Outpatient Follow-up:  1. Follow-up with Alvin Benton of general surgery as scheduled on 05/14/2016 at 11:20 AM. 2. Follow-up with Alvin Rhymes, MD in 1-2 weeks. On follow-up patient's blood pressure will need to be reassessed. Patient also needs a basic metabolic profile done to follow-up on electrolytes and renal function as well as a magnesium level. 3.    Discharge Diagnoses:  Principal Problem:   Ileus (HCC) Active Problems:   Tobacco use disorder   Diverticulitis   Alcohol abuse   Chronic constipation   Colovesical fistula   Anxiety state   Systolic murmur   Essential hypertension   Hypomagnesemia   Discharge Condition: Stable and improved  Diet recommendation: Regular  Filed Weights   05/05/16 1300 05/07/16 1505  Weight: 73.483 kg (162 lb) 75.479 kg (166 lb 6.4 oz)    History of present illness:  Per Dr. Crissie Reese Benton is a 65 y.o. male with medical history significant for ongoing alcoholism and tobacco abuse, acute diverticulitis initially hospitalized in May of this year with recent hospitalization early June for exacerbation of diverticulitis with possible colovesical fistula and a tiny microperforation, murmur since childhood, chronic anxiety and chronic constipation. Also during previous hospitalization patient had transient PAF which resolved independent of treatment. CHADVASC was 1. Patient was discharged on 10 days of Cipro and Flagyl which he has completed. He reported that this past Friday he began having increasing left lower quadrant pain, poor intake for 3 days, increasing flatus and anorexia. He reported subjective fevers and chills and inability to sleep. He also reported emesis and most recently dry heaves. He reported he has yet to follow up  with the surgery team as an outpatient because "I keep having these flares and have to come to the hospital". Since discharge he has followed up with his primary care physician (on 6/23). At that time he told her about "a miracle drug-lorazepam" he insisted he was no longer drinking and requested a prescription for this medication from his primary care physician. His primary care physician also formally arranged surgery follow-up for the patient  ED Course:  Vital signs: PO temp 97.7-BP 155/108-pulse 117-respirations 22-tremors saturations 100%  AAS: Bowel gas pattern consistent with ileus versus enteritis, early bowel obstruction possible but felt less likely given significant amount of air in the colon. No free air. Chest x-ray unremarkable Lab data: Sodium 138, potassium 4.3, CO2 21, BUN less than 5, creatinine 0.71, AST 44, white count 9500 differential not obtained, hemoglobin 16.8, platelets 375,000; urinalysis abnormal with few bacteria, amber color, 40 ketones, moderate leukocytes, 30 protein, yeast, too numerous to count the WBCs Medications and treatments: Dilaudid 1 mg IV 1, Zofran 4 mg IV 1, Zosyn 3.375 g IV 1  Hospital Course:  Ileus - Patient presented with abdominal pain, x-rays significant for possible ileus versus enteritis, afebrile, no leukocytosis, benign abdominal exam, findings likely related to ileus, possibly from narcotics, started on laxative regimen, repeat KUB 05/07/2016. showing persistent ileus, but patient denied nausea, vomiting. Patient endorsed good bowel movement. Patient with complaints of loose stools and a such MiraLAX was discontinued. Patient had improvement with loose stools which are becoming more formed. Patient was placed on a clear liquid diet and diet advanced to a soft diet which patient tolerated. Outpatient follow-up.  Alcohol abuse with evidence of DTs -  Patient with significant tremors, tachycardia on 7/11, transferred to stepdown for stepdown CIWA  protocol. Patient was also placed on thiamine, folic acid, started on clonidine, with clinical improvement. Discontinued clonidine. Patient was maintained on the Ativan CIWA protocol with significant improvement and resolution of tremors. Patient was also maintained on thiamine, folic acid and multivitamin. Outpatient follow-up.   Tobacco use disorder - Counseled. Placed on a nicotine patch.  History of Diverticulitis/Colovesical fistula -Recently completed a prolonged course of Cipro and Flagyl, afebrile, with no leukocytosis, discontinued IV Zosyn which patient was started on, as no evidence of infection. -Urinalysis abnormal and with concerns over possible colovesical fistula on prior CT , urine culture growing yeast - Patient has surgical follow-up next week as an outpatient  Hypomagnesemia - Secondary to GI losses. Repleted.  Hypertension Discontinued clonidine. Resume patient's home dose ARB. Norvasc was added to patient's regimen for better blood pressure control. Outpatient follow-up.   Procedures:  Acute abdominal series 05/05/2016, 05/07/2016  Consultations:  None  Discharge Exam: Filed Vitals:   05/09/16 1430 05/09/16 1446  BP: 161/84 161/84  Pulse: 88 88  Temp:  98.7 F (37.1 C)  Resp: 20     General: NAD Cardiovascular: RRR Respiratory: CTAB  Discharge Instructions   Discharge Instructions    Diet general    Complete by:  As directed      Discharge instructions    Complete by:  As directed   fOLLOW UP WITH Alvin RhymesKatherine Tabori, MD IN 1-2 WEEKS. Follow up with Dr Alvin Fushomas, surgery as scheduled.     Increase activity slowly    Complete by:  As directed           Current Discharge Medication List    START taking these medications   Details  amLODipine (NORVASC) 10 MG tablet Take 1 tablet (10 mg total) by mouth daily. Qty: 30 tablet, Refills: 2    !! Multiple Vitamin (MULTIVITAMIN WITH MINERALS) TABS tablet Take 1 tablet by mouth daily.    nicotine  (NICODERM CQ - DOSED IN MG/24 HOURS) 21 mg/24hr patch Place 1 patch (21 mg total) onto the skin daily. Qty: 28 patch, Refills: 0     !! - Potential duplicate medications found. Please discuss with provider.    CONTINUE these medications which have NOT CHANGED   Details  albuterol (PROVENTIL HFA;VENTOLIN HFA) 108 (90 BASE) MCG/ACT inhaler Inhale 2 puffs into the lungs every 6 (six) hours as needed for wheezing or shortness of breath. Qty: 1 Inhaler, Refills: 2    folic acid (FOLVITE) 1 MG tablet Take 1 tablet (1 mg total) by mouth daily. Qty: 30 tablet, Refills: 3    LORazepam (ATIVAN) 1 MG tablet Take 1 tablet (1 mg total) by mouth daily. Qty: 30 tablet, Refills: 1    !! Multiple Vitamin (MULTIVITAMIN WITH MINERALS) TABS tablet Take 1 tablet by mouth once a week.    Simethicone (GAS-X PO) Take 1 tablet by mouth daily as needed (gas).    thiamine (VITAMIN B-1) 100 MG tablet Take 100 mg by mouth daily.    valsartan (DIOVAN) 160 MG tablet TAKE 1 TABLET (160 MG TOTAL) BY MOUTH DAILY. Qty: 90 tablet, Refills: 1     !! - Potential duplicate medications found. Please discuss with provider.    STOP taking these medications     aspirin EC 81 MG tablet        No Known Allergies Follow-up Information    Follow up with Vanita PandaHOMAS, ALICIA C., MD On 05/14/2016.  Specialty:  General Surgery   Why:  f/u at 1120pm   Contact information:   [address] [place] [address] [phone]       Follow up with Alvin Rhymes, MD.   Specialty:  Family Medicine   Why:  f/u as shceduled.   Contact information:   [address] [place] [address] [phone]        The results of significant diagnostics from this hospitalization (including imaging, microbiology, ancillary and laboratory) are listed below for reference.    Significant Diagnostic Studies: Dg Abd Acute W/chest  05/05/2016  CLINICAL DATA:  Abdominal pain with history of diverticulitis EXAM: DG ABDOMEN  ACUTE W/ 1V CHEST COMPARISON:  Chest radiograph April 05, 2016; CT abdomen and pelvis April 04, 2016 FINDINGS: PA chest: No edema or consolidation. Heart size and pulmonary vascularity are normal. No adenopathy. Supine and upright abdomen: There are several loops of mildly dilated bowel with scattered air-fluid levels. No free air. There is a small phlebolith in the lower pelvis. Note that there is present in the rectum. IMPRESSION: The bowel gas pattern raises question of ileus or enteritis. Early bowel obstruction is possible but felt to be somewhat less likely given the degree of air in the colon. No free air evident. No lung edema or consolidation. Electronically Signed   By: Bretta Bang III M.D.   On: 05/05/2016 14:45   Dg Abd Portable 1v  05/07/2016  CLINICAL DATA:  Abdominal discomfort, ileus, history of diverticulitis and irritable bowel syndrome EXAM: PORTABLE ABDOMEN - 1 VIEW COMPARISON:  Abdominal series of May 05, 2016 FINDINGS: The volume of small and large bowel gas has decreased somewhat. This is a supine image and [place] air-fluid levels are not demonstrated. No free extraluminal gas collections are observed. The rectum is not included in the field of view. IMPRESSION: Mild interval decrease in the volume of small and large bowel gas. There remain loops of normal calibered gas-filled small bowel to the left of midline consistent with ileus. Electronically Signed   By: David  [place] M.D.   On: 05/07/2016 07:43    Microbiology: Recent Results (from the past 240 hour(s))  Urine culture     Status: Abnormal   Collection Time: 05/05/16  3:13 PM  Result Value Ref Range Status   Specimen Description URINE, CLEAN CATCH  Final   Special Requests NONE  Final   Culture >=100,000 COLONIES/mL YEAST (A)  Final   Report Status 05/06/2016 FINAL  Final  MRSA PCR Screening     Status: None   Collection Time: 05/06/16  2:37 PM  Result Value Ref Range Status   MRSA by PCR NEGATIVE NEGATIVE Final     Comment:        The GeneXpert MRSA Assay (FDA approved for NASAL specimens only), is one component of a comprehensive MRSA colonization surveillance program. It is not intended to diagnose MRSA infection nor to guide or monitor treatment for MRSA infections.      Labs: Basic Metabolic Panel:  Recent Labs Lab 05/05/16 1801 05/06/16 0421 05/06/16 1910 05/07/16 0552 05/08/16 0534 05/09/16 0604  NA  --  136 133* 134* 136 136  K  --  4.1 3.9 4.2 3.7 3.8  CL  --  105 102 106 108 106  CO2  --  23 24 21* 20* 22  GLUCOSE  --  90 109* 105* 107* 98  BUN  --  <5* <5*  CREATININE  --  0.62 0.67 0.59* 0.50* 0.49*  CALCIUM  --  8.8* 8.5* 8.2* 8.4* 8.6*  MG 1.6*  --   --  1.5* 1.7 1.9  PHOS 3.3  --   --   --   --   --    Liver Function Tests:  Recent Labs Lab 05/05/16 1308 05/06/16 0421  AST 44* 33  ALT 17 11*  ALKPHOS 75 55  BILITOT 0.6 1.0  PROT 8.0 6.2*  ALBUMIN 3.8 2.8*    Recent Labs Lab 05/05/16 1308  LIPASE 50   No results for input(s): AMMONIA in the last 168 hours. CBC:  Recent Labs Lab 05/05/16 1308 05/06/16 0421 05/07/16 0552 05/08/16 0534  WBC 9.5 6.9 6.3 6.3  HGB 16.8 13.4 11.6* 12.0*  HCT 48.8 40.6 34.8* 37.3*  MCV 95.3 95.8 96.7 97.1  PLT 375 235 222 234   Cardiac Enzymes: No results for input(s): CKTOTAL, CKMB, CKMBINDEX, TROPONINI in the last 168 hours. BNP: BNP (last 3 results) No results for input(s): BNP in the last 8760 hours.  ProBNP (last 3 results) No results for input(s): PROBNP in the last 8760 hours.  CBG:  Recent Labs Lab 05/05/16 1258  GLUCAP 92       Signed:  THOMPSON,DANIEL MD.  [facility] 05/09/2016, 3:52 PM

## 2016-05-09 NOTE — Progress Notes (Signed)
NURSING PROGRESS NOTE  Alvin NovaScott Benton [id number] Discharge Data: 05/09/2016 3:46 PM Attending Provider: Rodolph Bonganiel V Thompson, MD WUJ:[id number]:Katherine Beverely Lowabori, MD     Alvin NovaScott Benton to be D/C'd Home per MD order.  Discussed with the patient the After Visit Summary and all questions fully answered. All IV's discontinued with no bleeding noted. All belongings returned to patient for patient to take home.   Last Vital Signs:  Blood pressure 161/84, pulse 88, temperature 98.7 F (37.1 C), temperature source Oral, resp. rate 20, height 5\' 9"  (1.753 m), weight 75.479 kg (166 lb 6.4 oz), SpO2 99 %.  Discharge Medication List   Medication List    STOP taking these medications        aspirin EC 81 MG tablet      TAKE these medications        albuterol 108 (90 Base) MCG/ACT inhaler  Commonly known as:  PROVENTIL HFA;VENTOLIN HFA  Inhale 2 puffs into the lungs every 6 (six) hours as needed for wheezing or shortness of breath.     amLODipine 10 MG tablet  Commonly known as:  NORVASC  Take 1 tablet (10 mg total) by mouth daily.     folic acid 1 MG tablet  Commonly known as:  FOLVITE  Take 1 tablet (1 mg total) by mouth daily.     GAS-X PO  Take 1 tablet by mouth daily as needed (gas).     LORazepam 1 MG tablet  Commonly known as:  ATIVAN  Take 1 tablet (1 mg total) by mouth daily.     multivitamin with minerals Tabs tablet  Take 1 tablet by mouth once a week.     multivitamin with minerals Tabs tablet  Take 1 tablet by mouth daily.     nicotine 21 mg/24hr patch  Commonly known as:  NICODERM CQ - dosed in mg/24 hours  Place 1 patch (21 mg total) onto the skin daily.     thiamine 100 MG tablet  Commonly known as:  VITAMIN B-1  Take 100 mg by mouth daily.     valsartan 160 MG tablet  Commonly known as:  DIOVAN  TAKE 1 TABLET (160 MG TOTAL) BY MOUTH DAILY.         Bennie Pieriniyndi Justinian Miano, RN

## 2016-05-10 ENCOUNTER — Telehealth: Payer: Self-pay | Admitting: *Deleted

## 2016-05-10 NOTE — Telephone Encounter (Signed)
Unable to reach patient at time of TCM Call. Left message for patient to return call when available.  

## 2016-05-13 NOTE — Telephone Encounter (Signed)
Unable to reach patient at time of TCM Call. Line busy, unable to leave message. No other numbers on file for patient.

## 2016-05-20 ENCOUNTER — Ambulatory Visit: Payer: Managed Care, Other (non HMO) | Admitting: Family Medicine

## 2016-06-03 ENCOUNTER — Telehealth: Payer: Self-pay | Admitting: Family Medicine

## 2016-06-03 NOTE — Telephone Encounter (Signed)
Dismissal letter send to patient two times (certified mail and 1st class mail)  [facility]  06/03/16 DAJ

## 2016-06-04 ENCOUNTER — Emergency Department (HOSPITAL_COMMUNITY): Payer: Managed Care, Other (non HMO)

## 2016-06-04 ENCOUNTER — Inpatient Hospital Stay (HOSPITAL_COMMUNITY)
Admission: EM | Admit: 2016-06-04 | Discharge: 2016-06-09 | DRG: 392 | Disposition: A | Discharge status: Home / Self Care | Payer: Managed Care, Other (non HMO) | Attending: Internal Medicine | Admitting: Internal Medicine

## 2016-06-04 ENCOUNTER — Encounter (HOSPITAL_COMMUNITY): Payer: Self-pay | Admitting: Emergency Medicine

## 2016-06-04 DIAGNOSIS — F410 Panic disorder [episodic paroxysmal anxiety] without agoraphobia: Secondary | ICD-10-CM | POA: Diagnosis present

## 2016-06-04 DIAGNOSIS — E869 Volume depletion, unspecified: Secondary | ICD-10-CM | POA: Diagnosis present

## 2016-06-04 DIAGNOSIS — F1021 Alcohol dependence, in remission: Secondary | ICD-10-CM | POA: Diagnosis present

## 2016-06-04 DIAGNOSIS — F17210 Nicotine dependence, cigarettes, uncomplicated: Secondary | ICD-10-CM | POA: Diagnosis present

## 2016-06-04 DIAGNOSIS — F1010 Alcohol abuse, uncomplicated: Secondary | ICD-10-CM | POA: Diagnosis present

## 2016-06-04 DIAGNOSIS — E876 Hypokalemia: Secondary | ICD-10-CM | POA: Diagnosis present

## 2016-06-04 DIAGNOSIS — N321 Vesicointestinal fistula: Secondary | ICD-10-CM | POA: Diagnosis present

## 2016-06-04 DIAGNOSIS — K5720 Diverticulitis of large intestine with perforation and abscess without bleeding: Secondary | ICD-10-CM | POA: Diagnosis present

## 2016-06-04 DIAGNOSIS — F10239 Alcohol dependence with withdrawal, unspecified: Secondary | ICD-10-CM | POA: Diagnosis present

## 2016-06-04 DIAGNOSIS — E46 Unspecified protein-calorie malnutrition: Secondary | ICD-10-CM | POA: Diagnosis present

## 2016-06-04 DIAGNOSIS — E860 Dehydration: Secondary | ICD-10-CM | POA: Diagnosis present

## 2016-06-04 DIAGNOSIS — Z7982 Long term (current) use of aspirin: Secondary | ICD-10-CM | POA: Diagnosis not present

## 2016-06-04 DIAGNOSIS — I10 Essential (primary) hypertension: Secondary | ICD-10-CM | POA: Diagnosis present

## 2016-06-04 DIAGNOSIS — K5900 Constipation, unspecified: Secondary | ICD-10-CM | POA: Diagnosis present

## 2016-06-04 DIAGNOSIS — R8271 Bacteriuria: Secondary | ICD-10-CM | POA: Diagnosis present

## 2016-06-04 DIAGNOSIS — K5792 Diverticulitis of intestine, part unspecified, without perforation or abscess without bleeding: Secondary | ICD-10-CM | POA: Diagnosis present

## 2016-06-04 DIAGNOSIS — F10230 Alcohol dependence with withdrawal, uncomplicated: Secondary | ICD-10-CM | POA: Diagnosis not present

## 2016-06-04 DIAGNOSIS — Z823 Family history of stroke: Secondary | ICD-10-CM

## 2016-06-04 DIAGNOSIS — K5780 Diverticulitis of intestine, part unspecified, with perforation and abscess without bleeding: Secondary | ICD-10-CM | POA: Diagnosis present

## 2016-06-04 DIAGNOSIS — R1032 Left lower quadrant pain: Secondary | ICD-10-CM | POA: Diagnosis present

## 2016-06-04 LAB — COMPREHENSIVE METABOLIC PANEL
ALT: 29 U/L (ref 17–63)
ANION GAP: 12 (ref 5–15)
AST: 68 U/L — ABNORMAL HIGH (ref 15–41)
Albumin: 3 g/dL — ABNORMAL LOW (ref 3.5–5.0)
Alkaline Phosphatase: 156 U/L — ABNORMAL HIGH (ref 38–126)
BUN: <5 mg/dL — ABNORMAL LOW (ref 6–20)
CHLORIDE: 100 mmol/L — AB (ref 101–111)
CO2: 25 mmol/L (ref 22–32)
Calcium: 8.7 mg/dL — ABNORMAL LOW (ref 8.9–10.3)
Creatinine, Ser: 0.49 mg/dL — ABNORMAL LOW (ref 0.61–1.24)
GFR calc non Af Amer: >60 mL/min (ref >60)
Glucose, Bld: 101 mg/dL — ABNORMAL HIGH (ref 65–99)
POTASSIUM: 3.3 mmol/L — AB (ref 3.5–5.1)
SODIUM: 137 mmol/L (ref 135–145)
Total Bilirubin: 0.2 mg/dL — ABNORMAL LOW (ref 0.3–1.2)
Total Protein: 6.9 g/dL (ref 6.5–8.1)

## 2016-06-04 LAB — URINE MICROSCOPIC-ADD ON: RBC / HPF: NONE SEEN RBC/hpf (ref 0–5)

## 2016-06-04 LAB — CBC
HEMATOCRIT: 44.7 % (ref 39.0–52.0)
HEMOGLOBIN: 15.7 g/dL (ref 13.0–17.0)
MCH: 32.8 pg (ref 26.0–34.0)
MCHC: 35.1 g/dL (ref 30.0–36.0)
MCV: 93.5 fL (ref 78.0–100.0)
Platelets: 268 10*3/uL (ref 150–400)
RBC: 4.78 MIL/uL (ref 4.22–5.81)
RDW: 14.8 % (ref 11.5–15.5)
WBC: 5.2 10*3/uL (ref 4.0–10.5)

## 2016-06-04 LAB — URINALYSIS, ROUTINE W REFLEX MICROSCOPIC
BILIRUBIN URINE: NEGATIVE
Glucose, UA: NEGATIVE mg/dL
Hgb urine dipstick: NEGATIVE
Ketones, ur: NEGATIVE mg/dL
NITRITE: NEGATIVE
PH: 7.5 (ref 5.0–8.0)
Protein, ur: NEGATIVE mg/dL
SPECIFIC GRAVITY, URINE: 1.031 — AB (ref 1.005–1.030)

## 2016-06-04 LAB — LIPASE, BLOOD: LIPASE: 32 U/L (ref 11–51)

## 2016-06-04 MED ORDER — ADULT MULTIVITAMIN W/MINERALS CH
1.0000 | ORAL_TABLET | Freq: Every day | ORAL | Status: DC
  Administered 2016-06-05 – 2016-06-09 (×5): 1 via ORAL
  Filled 2016-06-04 (×5): qty 1

## 2016-06-04 MED ORDER — LORAZEPAM 1 MG PO TABS
1.0000 mg | ORAL_TABLET | Freq: Four times a day (QID) | ORAL | Status: AC | PRN
  Administered 2016-06-05 – 2016-06-07 (×6): 1 mg via ORAL
  Filled 2016-06-04 (×8): qty 1

## 2016-06-04 MED ORDER — HYDROMORPHONE HCL 1 MG/ML IJ SOLN
1.0000 mg | Freq: Once | INTRAMUSCULAR | Status: AC
  Administered 2016-06-04: 1 mg via INTRAVENOUS
  Filled 2016-06-04: qty 1

## 2016-06-04 MED ORDER — ACETAMINOPHEN 650 MG RE SUPP: 650.0000 mg | Freq: Four times a day (QID) | RECTAL | Status: DC | PRN

## 2016-06-04 MED ORDER — HYDROMORPHONE HCL 1 MG/ML IJ SOLN
1.0000 mg | INTRAMUSCULAR | Status: AC | PRN
  Administered 2016-06-05 (×5): 1 mg via INTRAVENOUS
  Filled 2016-06-04 (×5): qty 1

## 2016-06-04 MED ORDER — METRONIDAZOLE IN NACL 5-0.79 MG/ML-% IV SOLN
500.0000 mg | Freq: Three times a day (TID) | INTRAVENOUS | Status: DC
  Administered 2016-06-05: 500 mg via INTRAVENOUS
  Filled 2016-06-04: qty 100

## 2016-06-04 MED ORDER — LORAZEPAM 2 MG/ML IJ SOLN
0.5000 mg | Freq: Once | INTRAMUSCULAR | Status: AC
  Administered 2016-06-04: 0.5 mg via INTRAVENOUS
  Filled 2016-06-04: qty 1

## 2016-06-04 MED ORDER — POTASSIUM CHLORIDE IN NACL 20-0.45 MEQ/L-% IV SOLN
INTRAVENOUS | Status: DC
  Administered 2016-06-05 – 2016-06-09 (×5): via INTRAVENOUS
  Filled 2016-06-04 (×6): qty 1000

## 2016-06-04 MED ORDER — HYDROMORPHONE HCL 1 MG/ML IJ SOLN
0.5000 mg | Freq: Once | INTRAMUSCULAR | Status: AC
  Administered 2016-06-04: 0.5 mg via INTRAVENOUS
  Filled 2016-06-04: qty 1

## 2016-06-04 MED ORDER — SODIUM CHLORIDE 0.9 % IV BOLUS (SEPSIS)
1000.0000 mL | Freq: Once | INTRAVENOUS | Status: AC
  Administered 2016-06-04: 1000 mL via INTRAVENOUS

## 2016-06-04 MED ORDER — VITAMIN B-1 100 MG PO TABS
100.0000 mg | ORAL_TABLET | Freq: Every day | ORAL | Status: DC
  Administered 2016-06-05 – 2016-06-09 (×5): 100 mg via ORAL
  Filled 2016-06-04 (×5): qty 1

## 2016-06-04 MED ORDER — LORAZEPAM 2 MG/ML IJ SOLN
1.0000 mg | Freq: Four times a day (QID) | INTRAMUSCULAR | Status: AC | PRN
  Administered 2016-06-07: 1 mg via INTRAVENOUS
  Filled 2016-06-04: qty 1

## 2016-06-04 MED ORDER — ENOXAPARIN SODIUM 40 MG/0.4ML ~~LOC~~ SOLN
40.0000 mg | Freq: Every day | SUBCUTANEOUS | Status: DC
  Administered 2016-06-04 – 2016-06-08 (×5): 40 mg via SUBCUTANEOUS
  Filled 2016-06-04 (×5): qty 0.4

## 2016-06-04 MED ORDER — LORAZEPAM 2 MG/ML IJ SOLN
0.0000 mg | Freq: Four times a day (QID) | INTRAMUSCULAR | Status: AC
  Administered 2016-06-05: 2 mg via INTRAVENOUS
  Administered 2016-06-05 (×2): 1 mg via INTRAVENOUS
  Administered 2016-06-05 – 2016-06-06 (×4): 2 mg via INTRAVENOUS
  Filled 2016-06-04 (×7): qty 1

## 2016-06-04 MED ORDER — LORAZEPAM 2 MG/ML IJ SOLN
0.0000 mg | Freq: Two times a day (BID) | INTRAMUSCULAR | Status: DC
Start: 2016-06-07 — End: 2016-06-08
  Administered 2016-06-06 – 2016-06-08 (×3): 2 mg via INTRAVENOUS
  Filled 2016-06-04: qty 1
  Filled 2016-06-04: qty 2
  Filled 2016-06-04: qty 1

## 2016-06-04 MED ORDER — NICOTINE 21 MG/24HR TD PT24
21.0000 mg | MEDICATED_PATCH | Freq: Every day | TRANSDERMAL | Status: DC
  Administered 2016-06-05 – 2016-06-09 (×5): 21 mg via TRANSDERMAL
  Filled 2016-06-04 (×5): qty 1

## 2016-06-04 MED ORDER — ONDANSETRON HCL 4 MG/2ML IJ SOLN
4.0000 mg | Freq: Four times a day (QID) | INTRAMUSCULAR | Status: DC | PRN
  Administered 2016-06-04 – 2016-06-05 (×2): 4 mg via INTRAVENOUS
  Filled 2016-06-04 (×2): qty 2

## 2016-06-04 MED ORDER — SODIUM CHLORIDE 0.9 % IV BOLUS (SEPSIS)
500.0000 mL | Freq: Once | INTRAVENOUS | Status: AC
  Administered 2016-06-04: 500 mL via INTRAVENOUS

## 2016-06-04 MED ORDER — PIPERACILLIN-TAZOBACTAM 3.375 G IVPB 30 MIN
3.3750 g | Freq: Once | INTRAVENOUS | Status: AC
  Administered 2016-06-04: 3.375 g via INTRAVENOUS
  Filled 2016-06-04: qty 50

## 2016-06-04 MED ORDER — THIAMINE HCL 100 MG/ML IJ SOLN
Freq: Once | INTRAVENOUS | Status: AC
  Administered 2016-06-05: 01:00:00 via INTRAVENOUS
  Filled 2016-06-04: qty 1000

## 2016-06-04 MED ORDER — IOPAMIDOL (ISOVUE-300) INJECTION 61%
INTRAVENOUS | Status: AC
  Administered 2016-06-04: 100 mL
  Filled 2016-06-04: qty 100

## 2016-06-04 MED ORDER — THIAMINE HCL 100 MG/ML IJ SOLN: 100.0000 mg | Freq: Every day | INTRAMUSCULAR | Status: DC

## 2016-06-04 MED ORDER — ONDANSETRON HCL 4 MG PO TABS
4.0000 mg | ORAL_TABLET | Freq: Four times a day (QID) | ORAL | Status: DC | PRN
  Administered 2016-06-06: 4 mg via ORAL
  Filled 2016-06-04: qty 1

## 2016-06-04 MED ORDER — FOLIC ACID 1 MG PO TABS
1.0000 mg | ORAL_TABLET | Freq: Every day | ORAL | Status: DC
  Administered 2016-06-05 – 2016-06-09 (×5): 1 mg via ORAL
  Filled 2016-06-04 (×5): qty 1

## 2016-06-04 MED ORDER — SODIUM CHLORIDE 0.9% FLUSH
3.0000 mL | Freq: Two times a day (BID) | INTRAVENOUS | Status: DC
  Administered 2016-06-05 – 2016-06-07 (×5): 3 mL via INTRAVENOUS

## 2016-06-04 MED ORDER — ONDANSETRON HCL 4 MG/2ML IJ SOLN: 4.0000 mg | Freq: Three times a day (TID) | INTRAMUSCULAR | Status: DC | PRN

## 2016-06-04 MED ORDER — ACETAMINOPHEN 325 MG PO TABS
650.0000 mg | ORAL_TABLET | Freq: Four times a day (QID) | ORAL | Status: DC | PRN
  Administered 2016-06-06 – 2016-06-07 (×2): 650 mg via ORAL
  Filled 2016-06-04 (×2): qty 2

## 2016-06-04 MED ORDER — AMLODIPINE BESYLATE 10 MG PO TABS
10.0000 mg | ORAL_TABLET | Freq: Every day | ORAL | Status: DC
  Administered 2016-06-05 – 2016-06-09 (×5): 10 mg via ORAL
  Filled 2016-06-04 (×5): qty 1

## 2016-06-04 MED ORDER — METRONIDAZOLE IN NACL 5-0.79 MG/ML-% IV SOLN
500.0000 mg | Freq: Once | INTRAVENOUS | Status: AC
  Administered 2016-06-04: 500 mg via INTRAVENOUS
  Filled 2016-06-04: qty 100

## 2016-06-04 MED ORDER — PIPERACILLIN-TAZOBACTAM 3.375 G IVPB
3.3750 g | Freq: Three times a day (TID) | INTRAVENOUS | Status: DC
  Administered 2016-06-05 – 2016-06-07 (×7): 3.375 g via INTRAVENOUS
  Filled 2016-06-04 (×9): qty 50

## 2016-06-04 MED ORDER — HYDROMORPHONE HCL 1 MG/ML IJ SOLN
2.0000 mg | Freq: Once | INTRAMUSCULAR | Status: AC
  Administered 2016-06-04: 2 mg via INTRAVENOUS
  Filled 2016-06-04: qty 2

## 2016-06-04 NOTE — ED Notes (Signed)
Pt transported to [facility]

## 2016-06-04 NOTE — ED Provider Notes (Signed)
[facility]-EMERGENCY DEPT Provider Note   CSN: [id number] Arrival date & time: 06/04/16  1212  First Provider Contact:  First MD Initiated Contact with Patient 06/04/16 1558        History   Chief Complaint Chief Complaint  Patient presents with  . Abdominal Pain    HPI Alvin Benton is a 64 y.o. male.  65 yo male with history of diverticulitis and ongoing alcoholism that presents today with abd pain. Patients states that the pain started approx 4 days ago. It is a constant sharp pain in the RUQ and a dull pain in the LLQ. Patient states that the pain has not gotten better. Nothing makes better or worse. Rates the pain a 8/10. Patient has been trying to treated the pain with alcohol. He has been drinking liquor drinks several time a day. His last drink was this AM. He also complains of nausea with several episodes of dry heaving without emesis. Last BM was this AM with diarrhea. Denies any hematochezia or melena. Denies any cp, sob, urinary symptoms.  Patient story is very sporadic and disorganized. Patient has acute diverticulitis and was initially hospitalized in May of 2017. He had a recent hospitalization in June of 2017 for exacerbation of diverticulitis with possible colovesical fistula. Patient is currently on cipro and flagly for is acute flares. He is suppose to follow up with general surgeon but has been unable to do so because of these "attacks." He has had several appointments with Dr. Maisie Fus since his most recent hospitalization in July but has not been able to see him because of his attacks. Patient insists that he is still taking abx, but can't remember when his last appointment was with Dr. Maisie Fus.      The history is provided by the patient.  Abdominal Pain   Associated symptoms include diarrhea, nausea and constipation. Pertinent negatives include fever, vomiting, frequency and headaches.    Past Medical History:  Diagnosis Date  . Blood in stool   . Diverticulitis   .  Hypertension   . IBS (irritable bowel syndrome)   . Migraine    "none in a long time" (03/14/2016)    Patient Active Problem List   Diagnosis Date Noted  . Essential hypertension   . Hypomagnesemia   . Ileus (HCC) 05/05/2016  . Systolic murmur 05/05/2016  . Anxiety state 04/19/2016  . 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 Hypoxia 04/05/2016  . Atrial fibrillation (HCC) 04/05/2016  . Diverticulitis of large intestine with perforation and abscess 03/14/2016  . Chronic constipation 03/08/2016  . Alcohol abuse 02/15/2016  . Diverticulitis 01/31/2016  . Tobacco use disorder 07/17/2015  . Allergic rhinitis 07/17/2015  . Reactive airway disease with wheezing 07/17/2015  . Overweight (BMI 25.0-29.9) 07/17/2015  . HTN (hypertension) 01/12/2014  . Hand pain, left 01/12/2014  . BRBPR (bright red blood per rectum) 09/17/2013  . LLQ pain-chronic, intermittent and episodic 09/17/2013    Past Surgical History:  Procedure Laterality Date  . COLONOSCOPY    . TONSILLECTOMY         Home Medications    Prior to Admission medications   Medication Sig Start Date End Date Taking? Authorizing Provider  albuterol (PROVENTIL HFA;VENTOLIN HFA) 108 (90 BASE) MCG/ACT inhaler Inhale 2 puffs into the lungs every 6 (six) hours as needed for wheezing or shortness of breath. 07/17/15   Sheliah Hatch, MD  amLODipine (NORVASC) 10 MG tablet Take 1 tablet (10 mg total) by mouth daily. 05/09/16   Rodolph Bong,  MD  famotidine (PEPCID) 20 MG tablet Take 1 tablet (20 mg total) by mouth 2 (two) times daily. 05/09/16   Rodolph Bong, MD  folic acid (FOLVITE) 1 MG tablet Take 1 tablet (1 mg total) by mouth daily. 03/08/16 03/08/17  Sheliah Hatch, MD  LORazepam (ATIVAN) 1 MG tablet Take 1 tablet (1 mg total) by mouth daily. 04/19/16   Sheliah Hatch, MD  Multiple Vitamin (MULTIVITAMIN WITH MINERALS) TABS tablet Take 1 tablet by mouth once a week.    Historical Provider, MD  Multiple Vitamin  (MULTIVITAMIN WITH MINERALS) TABS tablet Take 1 tablet by mouth daily. 05/09/16   Rodolph Bong, MD  nicotine (NICODERM CQ - DOSED IN MG/24 HOURS) 21 mg/24hr patch Place 1 patch (21 mg total) onto the skin daily. 05/09/16   Rodolph Bong, MD  Simethicone (GAS-X PO) Take 1 tablet by mouth daily as needed (gas).    Historical Provider, MD  thiamine (VITAMIN B-1) 100 MG tablet Take 100 mg by mouth daily.    Historical Provider, MD  valsartan (DIOVAN) 160 MG tablet TAKE 1 TABLET (160 MG TOTAL) BY MOUTH DAILY. 10/27/15   Sheliah Hatch, MD    Family History Family History  Problem Relation Age of Onset  . Stroke Mother   . Colon cancer Neg Hx   . Esophageal cancer Neg Hx   . Rectal cancer Neg Hx   . Stomach cancer Neg Hx     Social History Social History  Substance Use Topics  . Smoking status: Current Every Day Smoker    Packs/day: 1.00    Years: 40.00    Types: Cigarettes  . Smokeless tobacco: Never Used  . Alcohol use 25.2 oz/week    21 Shots of liquor, 21 Cans of beer per week     Comment: daily     Allergies   Review of patient's allergies indicates no known allergies.   Review of Systems Review of Systems  Constitutional: Positive for activity change and appetite change. Negative for chills, fever and unexpected weight change.  HENT: Negative.   Eyes: Negative.   Respiratory: Negative for cough and shortness of breath.   Cardiovascular: Negative for chest pain and palpitations.  Gastrointestinal: Positive for abdominal pain, constipation, diarrhea and nausea. Negative for blood in stool and vomiting.  Genitourinary: Negative for difficulty urinating, flank pain, frequency, scrotal swelling, testicular pain and urgency.  Musculoskeletal: Negative.   Skin: Negative.   Neurological: Negative for dizziness, weakness, light-headedness, numbness and headaches.     Physical Exam Updated Vital Signs BP 167/89 (BP Location: Right Arm)   Pulse 84   Temp 98.6 F  (37 C) (Oral)   Resp 12   Wt 75.3 kg   SpO2 100%   BMI 24.51 kg/m   Physical Exam  Constitutional: He is oriented to person, place, and time. He appears well-developed and well-nourished. He appears distressed (Due to pain).  HENT:  Head: Normocephalic and atraumatic.  Mouth/Throat: Oropharynx is clear and moist.  Eyes: Conjunctivae are normal. Pupils are equal, round, and reactive to light. No scleral icterus.  Neck: Normal range of motion. Neck supple. No thyromegaly present.  Cardiovascular: Normal rate, regular rhythm, normal heart sounds and intact distal pulses.   Pulmonary/Chest: Effort normal and breath sounds normal.  Abdominal: Soft. Bowel sounds are normal. He exhibits no distension. There is no hepatosplenomegaly. There is tenderness in the right upper quadrant and left lower quadrant. There is positive Murphy's sign. There is no  rigidity, no rebound and no guarding.  Lymphadenopathy:    He has no cervical adenopathy.  Neurological: He is alert and oriented to person, place, and time. No cranial nerve deficit. He exhibits normal muscle tone.  Skin: Skin is warm and dry. Capillary refill takes less than 2 seconds.  Psychiatric: His speech is normal. Thought content normal. His mood appears anxious. He is agitated. He is inattentive.     ED Treatments / Results  Labs (all labs ordered are listed, but only abnormal results are displayed) Labs Reviewed  COMPREHENSIVE METABOLIC PANEL - Abnormal; Notable for the following:       Result Value   Potassium 3.3 (*)    Chloride 100 (*)    Glucose, Bld 101 (*)    BUN <5 (*)    Creatinine, Ser 0.49 (*)    Calcium 8.7 (*)    Albumin 3.0 (*)    AST 68 (*)    Alkaline Phosphatase 156 (*)    Total Bilirubin 0.2 (*)    All other components within normal limits  URINALYSIS, ROUTINE W REFLEX MICROSCOPIC (NOT AT [place] [facility]) - Abnormal; Notable for the following:    Specific Gravity, Urine 1.031 (*)    Leukocytes, UA MODERATE (*)    All  other components within normal limits  URINE MICROSCOPIC-ADD ON - Abnormal; Notable for the following:    Squamous Epithelial / LPF 0-5 (*)    Bacteria, UA MANY (*)    All other components within normal limits  LIPASE, BLOOD  CBC    EKG  EKG Interpretation None       Radiology Ct Abdomen Pelvis W Contrast  Result Date: 06/04/2016 CLINICAL DATA:  Intermittent upper abdominal pain since February. EXAM: CT ABDOMEN AND PELVIS WITH CONTRAST TECHNIQUE: Multidetector CT imaging of the abdomen and pelvis was performed using the standard protocol following bolus administration of intravenous contrast. CONTRAST:  ISOVUE-300 IOPAMIDOL (ISOVUE-300) INJECTION 61% COMPARISON:  04/04/2016 FINDINGS: Lower chest:  Unremarkable. Hepatobiliary: The liver shows diffusely decreased attenuation suggesting steatosis. Mild gallbladder wall thickening or pericholecystic fluid is evident. No definite stones in the gallbladder. No intrahepatic or extrahepatic biliary dilation. Pancreas: No focal mass lesion. No dilatation of the main duct. No intraparenchymal cyst. No peripancreatic edema. Spleen: No splenomegaly. No focal mass lesion. Adrenals/Urinary Tract: No adrenal nodule or mass. Kidneys are unremarkable. No evidence for hydroureter. Gas is identified in the urinary bladder. Left bladder wall thickening is evident. Stomach/Bowel: Stomach is nondistended. No gastric wall thickening. No evidence of outlet obstruction. Duodenum is normally positioned as is the ligament of Treitz. No small bowel wall thickening. No small bowel dilatation. The terminal ileum is normal. The appendix is not visualized, but there is no edema or inflammation in the region of the cecum. As seen on previous imaging, there are diverticular changes in the left colon. Proximal sigmoid segment shows ill-defined wall thickening with pericolonic edema/ inflammation. Several small loculated of gas are seen in the sigmoid mesocolon consistent with  micro perforation. Small bowel loops are tethered into the left lower quadrant inflammatory process. Coronal imaging shows bladder wall thickening contiguous with the abnormal colon with gas loculated between the colon in the bladder wall. Imaging features are highly suspicious for colovesical fistula. Vascular/Lymphatic: There is abdominal aortic atherosclerosis without aneurysm. There is no gastrohepatic or hepatoduodenal ligament lymphadenopathy. No intraperitoneal or retroperitoneal lymphadenopathy. Small pelvic sidewall lymph nodes are stable. Reproductive: Prostate gland is enlarged. Other: No substantial intraperitoneal free fluid. Musculoskeletal: Bone windows reveal  no worrisome lytic or sclerotic osseous lesions. IMPRESSION: 1. Persistent inflammatory changes in the left lower quadrant with extraluminal gas in the sigmoid mesocolon as before, suggesting chronic micro perforation due to sigmoid colon diverticulitis. No drainable abscess at this time. As neoplasm can present with similar imaging features, correlation with colorectal cancer screening history recommended. 2. Inflammatory changes in the sigmoid colon are associated with gas in the wall of the bladder and asymmetric bladder wall thickening along the region in contact with the inflamed colon. Gas in the bladder lumen is noted. Imaging features are compatible with colovesical fistula. 3. Abdominal aortic atherosclerosis. 4. Mild pericholecystic fluid or gallbladder wall thickening. This may be a secondary finding. Ultrasound could be used to further evaluate as clinically warranted. No biliary dilatation. Electronically Signed   By: Kennith CenterEric  Mansell M.D.   On: 06/04/2016 17:33   [place] Abdomen Limited  Result Date: 06/04/2016 CLINICAL DATA:  Elevated LFTs. EXAM: US ABDOMEN LIMITED - RIGHT UPPER QUADRANT COMPARISON:  CT scan from earlier today. FINDINGS: Gallbladder: Gallbladder is distended without gallbladder wall thickening or pericholecystic fluid  by ultrasound. No evidence for gallstones. Sludge is visualized in the lumen. The sonographer reports no sonographic Murphy sign. Common bile duct: Diameter: 5 Liver: Coarsened echotexture with poor acoustic through transmission suggests fatty deposition. IMPRESSION: No acute findings. Electronically Signed   By: Kennith CenterEric  Mansell M.D.   On: 06/04/2016 20:16    Procedures Procedures (including critical care time)  Medications Ordered in ED Medications  HYDROmorphone (DILAUDID) injection 1 mg (1 mg Intravenous Given 06/04/16 1652)  LORazepam (ATIVAN) injection 0.5 mg (0.5 mg Intravenous Given 06/04/16 1652)  sodium chloride 0.9 % bolus 1,000 mL (0 mLs Intravenous Stopped 06/04/16 1915)  iopamidol (ISOVUE-300) 61 % injection (100 mLs  Contrast Given 06/04/16 1703)  piperacillin-tazobactam (ZOSYN) IVPB 3.375 g (0 g Intravenous Stopped 06/04/16 1943)  metroNIDAZOLE (FLAGYL) IVPB 500 mg (0 mg Intravenous Stopped 06/04/16 2015)  sodium chloride 0.9 % bolus 500 mL (0 mLs Intravenous Stopped 06/04/16 2019)  HYDROmorphone (DILAUDID) injection 0.5 mg (0.5 mg Intravenous Given 06/04/16 1912)  LORazepam (ATIVAN) injection 0.5 mg (0.5 mg Intravenous Given 06/04/16 2024)     Initial Impression / Assessment and Plan / ED Course  I have reviewed the triage vital signs and the nursing notes.  Pertinent labs & imaging results that were available during my care of the patient were reviewed by me and considered in my medical decision making (see chart for details).  Clinical Course  Value Comment By Time  Total Protein: 6.9 (Reviewed) Rise MuKenneth T Zari Cly, PA-C 08/08 [phone]  1558 Patient seen and examined by provider. Orders placed. Discussed plan of care with Dr. Ranae PalmsYelverton.  5:26 PM Nurse notified me that patient is back from CT and complains of shaking. Notable upper extremity shaking. Patient states he doesn't feel like he is withdrawing from alcohol.   6:35 PM Reviewed imaging. Re assessed patient. He was sleeping in room.  States pain is controlled.  6:48 PM Discussed plan of care with Dr. Ranae PalmsYelverton. He will go see patient. He would like me to order an abd ultrasound concerning for cholecystitis  and IV abx.  Abd ultrasound without acute findings. Discussed with patient need for admission and he verbalized understanding. Consulted hospital medicine. Admission orders placed.  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 Patient continues  to have remitting attacks. Needs to be admitted for IV abx and surgery consult. Patient also chronic alcohol abuser. Last drink this AM. Drinks daily. Need for CIWA for likely withdrawals. Hospital medicine admitting patient. Dr. Arlean Hopping is the admitting doctor. New Prescriptions New Prescriptions   No medications on file     Wallace Keller 06/04/16 2137    Loren Racer, MD 06/06/16 1311

## 2016-06-04 NOTE — ED Triage Notes (Signed)
Pt here with abdominal pain since "the weekend." Ot sts hx of diverticulitis "complicated by alcoholism." Pt sts he has been trying to self medicate with alcohol. Pt reports he has been unable to eat in 1 week. Denies constipation/diarrhea, emesis.

## 2016-06-04 NOTE — ED Notes (Signed)
This RN checked on patient and no tremors observed at this time. Pt calm and relaxed.

## 2016-06-04 NOTE — ED Notes (Addendum)
Pt now back from CT and called out. Pt states to this RN "Is there anything I can get to stop this shaking?" Pt visibly shaking in both upper extremities. MD notified. Pt states that he does not feel like he is withdrawing from alcohol.

## 2016-06-04 NOTE — H&P (Signed)
[facility] History and Physical  Sherril Heyward ONG:[id number] DOB: [birth year] DOA: 06/04/2016  Referring physician: Dr Ranae Palms PCP: Neena Rhymes, MD   Chief Complaint: Abd pain  HPI: Alvin Benton is a 65 y.o. male with history of etoh abuse, HTN and tobacco use has had two admissions this year for diverticulitis, first in May then again in June.  Was here in July and didn't appear to have infection but was treated for DT's.  Now presents with abd pain LLQ for several days , and also some RUQ pain.  CT abd in ED shows "chronic micro perforation due to sigmoid colon diverticulitis", no drainable abscess and prob colovesical fistula.  He has been unable to eat much the last 4-5 days.  No fevers or chills, no diarrhea, no voiding issues per pt. Poor historian today, shaking a lot in ED.  Had rec'd IV dilaudid and ATivan in ED so far.  Asked to see for admission.    May 2017 - 8 yo w hx diverticulitis since the 1990's, treated at [facility] several times. 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 Admitted for IV abx, IR felt the abscesses were not amenable to drainage.  Improved on bowel rest, abx, dc'd home on Augmentin on D#4, to f/u w Dr Janee Morn Jun 2017 - abd pain/ n/v, persistent or recurrent diverticulitis LLQ w prob colovesical fistula, rx'd with IV abx (zosyn), IVF, pain control. Diet advanced, transitoined to po cipro/ flagyl to take for 2 more weeks at home.  F/U w surg as OP.   Jul 2017 - etoh abuse w worsening abd pain, n/v, anorexia.  Afeb / no ^ wbc so Abx were stopped after 1 day. Urine grew yeast, known colovesical fistula.  ETOH abuse w DT's, used the CIWA protocol, clonidine, thiamine , folic acid. DC'd home w gen surg f/u  ROS  denies CP  no joint pain   no HA  no blurry vision  no rash  no diarrhea  no nausea/ vomiting  no dysuria  no difficulty voiding  no change in urine color     Past Medical History  Past Medical History:  Diagnosis Date  . Blood in stool   . Diverticulitis   . Hypertension   . IBS (irritable bowel syndrome)   . Migraine    "none in a long time" (03/14/2016)   Past Surgical History  Past Surgical History:  Procedure Laterality Date  . COLONOSCOPY    . TONSILLECTOMY     Family History  Family History  Problem Relation Age of Onset  . Stroke Mother   . Colon cancer Neg Hx   . Esophageal cancer Neg Hx   . Rectal cancer Neg Hx   . Stomach cancer Neg Hx    Social History  reports that he has been smoking Cigarettes.  He has a 40.00 pack-year smoking history. He has never used smokeless tobacco. He reports that he drinks about 25.2 oz of alcohol per week . He reports that he does not use drugs. Allergies No Known Allergies Home medications Prior to Admission medications   Medication Sig Start Date End Date Taking? Authorizing Provider  albuterol (PROVENTIL HFA;VENTOLIN HFA) 108 (90 BASE) MCG/ACT inhaler Inhale 2 puffs into the lungs every 6 (six) hours as needed for wheezing or shortness of breath. 07/17/15  Yes Sheliah Hatch, MD  aspirin EC 325 MG tablet Take 325 mg by mouth daily.  Yes Historical Provider, MD  ciprofloxacin (CIPRO) 500 MG tablet Take 500 mg by mouth 2 (two) times daily.   Yes Historical Provider, MD  famotidine (PEPCID) 20 MG tablet Take 1 tablet (20 mg total) by mouth 2 (two) times daily. Patient taking differently: Take 20 mg by mouth 2 (two) times daily as needed for heartburn or indigestion.  05/09/16  Yes Rodolph Bong, MD  folic acid (FOLVITE) 1 MG tablet Take 1 tablet (1 mg total) by mouth daily. 03/08/16 03/08/17 Yes Sheliah Hatch, MD  LORazepam (ATIVAN) 1 MG tablet Take 1 tablet (1 mg total) by mouth daily. Patient taking differently: Take 1 mg by mouth daily as needed for anxiety.  04/19/16  Yes Sheliah Hatch, MD  metroNIDAZOLE (FLAGYL) 500 MG tablet Take 500 mg by mouth 3 (three) times daily.    Yes Historical Provider, MD  Multiple Vitamin (MULTIVITAMIN WITH MINERALS) TABS tablet Take 1 tablet by mouth daily. 05/09/16  Yes Rodolph Bong, MD  nicotine (NICODERM CQ - DOSED IN MG/24 HOURS) 21 mg/24hr patch Place 1 patch (21 mg total) onto the skin daily. Patient taking differently: Place 21 mg onto the skin daily as needed (when is hospital).  05/09/16  Yes Rodolph Bong, MD  Simethicone (GAS-X PO) Take 1 tablet by mouth daily as needed (gas).   Yes Historical Provider, MD  thiamine (VITAMIN B-1) 100 MG tablet Take 100 mg by mouth daily.   Yes Historical Provider, MD  valsartan (DIOVAN) 160 MG tablet TAKE 1 TABLET (160 MG TOTAL) BY MOUTH DAILY. 10/27/15  Yes Sheliah Hatch, MD  amLODipine (NORVASC) 10 MG tablet Take 1 tablet (10 mg total) by mouth daily. Patient not taking: Reported on 06/04/2016 05/09/16   Rodolph Bong, MD   Liver Function Tests  Recent Labs Lab 06/04/16 1309  AST 68*  ALT 29  ALKPHOS 156*  BILITOT 0.2*  PROT 6.9  ALBUMIN 3.0*    Recent Labs Lab 06/04/16 1309  LIPASE 32   CBC  Recent Labs Lab 06/04/16 1309  WBC 5.2  HGB 15.7  HCT 44.7  MCV 93.5  PLT 268   Basic Metabolic Panel  Recent Labs Lab 06/04/16 1309  NA 137  K 3.3*  CL 100*  CO2 25  GLUCOSE 101*  BUN <5*  CREATININE 0.49*  CALCIUM 8.7*     Vitals:   06/04/16 2100 06/04/16 2115 06/04/16 2130 06/04/16 2145  BP: 127/75 124/78 133/82 145/89  Pulse: 92 99 101   Resp:      Temp:      TempSrc:      SpO2: 92% 94% 96%   Weight:       Exam: Gen mid aged WM, quite tremulous, alert and Ox 3, uncomfortable No rash, cyanosis or gangrene Sclera anicteric, throat clear No jvd or bruits Chest clear bilat to bases RRR 2/6 harsh SEM rusb, no RG Abd firm, mild-mod tenderness LLQ and RUQ, +BS throughout, no scars, no rebound GU normal male MS no joint effusions or deformity Ext no leg or UE edema / no wounds or ulcers Neuro is alert, Ox 3 , nf, shaking UE's   Na  137 K 3.3 CO2 25  CL 100 BUN <5  Creat 0.49  CA 8.7  Glu 101 Alkphos 156  Alb 3.0  AST 68  ALT 29  Tbili 0.2   WBC 5k  Hb 15  plt 268 UA > 1.031, 6-30 wbc's/ no rbc/ 0-5 epi/ many bact  CT abd/pelvis 8/8 >  1. Persistent inflammatory changes in the left lower quadrant with extraluminal gas in the sigmoid mesocolon as before, suggesting chronic micro perforation due to sigmoid colon diverticulitis. No drainable abscess at this time. As neoplasm can present with similar imaging features, correlation with colorectal cancer screening history recommended. 2. Inflammatory changes in the sigmoid colon are associated with gas in the wall of the bladder and asymmetric bladder wall thickening along the region in contact with the inflamed colon. Gas in the bladder lumen is noted. Imaging features are compatible with colovesical fistula.   Assessment: 1  Recurrent diverticulitis / abd pain/ anorexia/ colovesical fistula - pt not reliable with etoh history to f/u in outpatient setting, will need surgery to address while here. Start iv ABX, pain meds, fluids 2  Etoh abuse w withdrawal/ tremors - CIWA 3  HTN - cont norvasc , hold ARB 4  Vol depletion 5  Hypokalemia 6  Pyuria - get culture  Plan - admit, IV flagyl/ zosyn, IVFs, consult gen surg in am     Maree KrabbeSCHERTZ,Christyl Osentoski D [facility] Pager [phone]  Cell [phone]  If 7PM-7AM, please contact night-coverage [email or web address] Password [email or web address] 06/04/2016, 10:18 PM

## 2016-06-04 NOTE — ED Notes (Signed)
Admitting MD gave this RN verbal order for  of Dilaudid

## 2016-06-04 NOTE — ED Notes (Signed)
Pt back from CT and requesting more anti anxiety medicine and nausea medication. Provider notified by this RN

## 2016-06-05 DIAGNOSIS — F10230 Alcohol dependence with withdrawal, uncomplicated: Secondary | ICD-10-CM

## 2016-06-05 DIAGNOSIS — K5720 Diverticulitis of large intestine with perforation and abscess without bleeding: Principal | ICD-10-CM

## 2016-06-05 LAB — CBC
HEMATOCRIT: 39.8 % (ref 39.0–52.0)
HEMOGLOBIN: 13.4 g/dL (ref 13.0–17.0)
MCH: 32.1 pg (ref 26.0–34.0)
MCHC: 33.7 g/dL (ref 30.0–36.0)
MCV: 95.2 fL (ref 78.0–100.0)
Platelets: 219 10*3/uL (ref 150–400)
RBC: 4.18 MIL/uL — ABNORMAL LOW (ref 4.22–5.81)
RDW: 14.9 % (ref 11.5–15.5)
WBC: 7.9 10*3/uL (ref 4.0–10.5)

## 2016-06-05 LAB — COMPREHENSIVE METABOLIC PANEL
ALBUMIN: 2.7 g/dL — AB (ref 3.5–5.0)
ALT: 24 U/L (ref 17–63)
ANION GAP: 12 (ref 5–15)
AST: 53 U/L — ABNORMAL HIGH (ref 15–41)
Alkaline Phosphatase: 126 U/L (ref 38–126)
BILIRUBIN TOTAL: 0.5 mg/dL (ref 0.3–1.2)
BUN: <5 mg/dL — ABNORMAL LOW (ref 6–20)
CO2: 22 mmol/L (ref 22–32)
Calcium: 7.8 mg/dL — ABNORMAL LOW (ref 8.9–10.3)
Chloride: 105 mmol/L (ref 101–111)
Creatinine, Ser: 0.51 mg/dL — ABNORMAL LOW (ref 0.61–1.24)
GFR calc Af Amer: >60 mL/min (ref >60)
Glucose, Bld: 106 mg/dL — ABNORMAL HIGH (ref 65–99)
POTASSIUM: 2.8 mmol/L — AB (ref 3.5–5.1)
Sodium: 139 mmol/L (ref 135–145)
TOTAL PROTEIN: 5.9 g/dL — AB (ref 6.5–8.1)

## 2016-06-05 LAB — MAGNESIUM: Magnesium: 1.1 mg/dL — ABNORMAL LOW (ref 1.7–2.4)

## 2016-06-05 MED ORDER — HYDRALAZINE HCL 20 MG/ML IJ SOLN
10.0000 mg | Freq: Four times a day (QID) | INTRAMUSCULAR | Status: DC | PRN
  Administered 2016-06-05: 10 mg via INTRAVENOUS
  Filled 2016-06-05: qty 1

## 2016-06-05 MED ORDER — BOOST / RESOURCE BREEZE PO LIQD
1.0000 | Freq: Three times a day (TID) | ORAL | Status: DC
  Administered 2016-06-06 – 2016-06-07 (×5): 1 via ORAL

## 2016-06-05 NOTE — Consult Note (Signed)
Reason for Consult:  Recurrent diverticulitis with colovesicular fistula-  Referring Physician: Dr. Fanny Bien  Alvin Benton is an 65 y.o. male.  HPI: Patient known to our service who was hospitalized with diverticulitis 5/18 - 03/18/16. 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 We are discussing colectomy and colostomy at that time. A Foley was placed in his diet was slowly advanced. During the hospitalization he had issues with atrial fibrillation controlled with Cardizem. He was transitioned to oral antibiotics and discharged in June. He was readmitted on 05/05/16 with abdominal pain and DTs. Known history of alcohol abuse. He required transfer to the stepdown unit and CIWA protocol at that time. He was discharged on 05/09/2016 with plans for follow-up with Dr. Marcello Moores in our office.       He returned yesterday for abdominal pain since the weekend, he's been self-medicating with alcohol and unable to eat for 1 week. Pain reoccurred in the left lower quadrant.   He has had several follow-up appointments with Dr. Marcello Moores but reports not being able to follow-up because of recurrent hospitalizations. Is sounds like he is ever really finishes postop course of antibiotics after discharge. He reports the antibiotics prescribed during his July discharge did not go to the pharmacy. He did complete his antibiotics prescribed back in May that he did not complete. Dates and amounts of antibiotic therapy are unclear at this point. Workup since admission here shows he is afebrile he has been tachycardic and hypertensive. O2 saturations are normal on room air. Admission labs shows a sodium 137 potassium 3.3, alkaline phosphatase is elevated at 156 albumin is 3.0. WBC is 5.2, hemoglobin 15.7 and hematocrit 44.7. Platelets are normal at 268,000. Repeat labs this a.m. shows a potassium down to 2.8 a decrease in the albumin and total protein; unremarkable LFTs. Abdominal  ultrasound showed no acute findings no evidence for gallstones or sludge common bile duct of 5 mm. Gallbladder was distended without gallbladder wall thickening or portal pericholecystic fluid.  CT scan: Shows persistent inflammatory changes left lower quadrant with extraluminal gas in the sigmoid mesocolon just and chronic microperforation due to sigmoid colon diverticulitis. No drainable abscess produced wasn't could not be ruled out. Laboratory changes of the sigmoid colon associated with gas in the wall of the bladder asymmetric bladder wall thickening. Gas in the bladder lumen is noted. Compatible with a colovesicular fistula. There is also some abdominal aortic atherosclerosis and mild peri-cholecystic fluid in the gallbladder wall. He remains afebrile.                                                         Past Medical History:  Diagnosis Date  . Blood in stool   . Diverticulitis   . Hypertension   . IBS (irritable bowel syndrome)   . Migraine    "none in a long time" (03/14/2016)    Past Surgical History:  Procedure Laterality Date  . COLONOSCOPY    . TONSILLECTOMY      Family History  Problem Relation Age of Onset  . Stroke Mother   . Colon cancer Neg Hx   . Esophageal cancer Neg Hx   . Rectal cancer Neg Hx   . Stomach cancer Neg Hx     Social History:  reports that he  has been smoking Cigarettes.  He has a 40.00 pack-year smoking history. He has never used smokeless tobacco. He reports that he drinks about 25.2 oz of alcohol per week . He reports that he does not use drugs. EtOH: History of heavy use. He currently admits to about a pint or more of rum per week. Tobacco: One pack per day for approximately 43 years. Drugs: Denies any illicit drug use. He lives alone, unsure of marital status. He works as an Regulatory affairs officer.  Allergies: No Known Allergies  Medications:  Prior to Admission:  Prescriptions Prior to Admission  Medication Sig Dispense Refill Last Dose  .  albuterol (PROVENTIL HFA;VENTOLIN HFA) 108 (90 BASE) MCG/ACT inhaler Inhale 2 puffs into the lungs every 6 (six) hours as needed for wheezing or shortness of breath. 1 Inhaler 2 Past Month at Unknown time  . aspirin EC 325 MG tablet Take 325 mg by mouth daily.   06/04/2016 at Unknown time  . ciprofloxacin (CIPRO) 500 MG tablet Take 500 mg by mouth 2 (two) times daily.   06/03/2016 at Unknown time  . famotidine (PEPCID) 20 MG tablet Take 1 tablet (20 mg total) by mouth 2 (two) times daily. (Patient taking differently: Take 20 mg by mouth 2 (two) times daily as needed for heartburn or indigestion. )   Past Month at Unknown time  . folic acid (FOLVITE) 1 MG tablet Take 1 tablet (1 mg total) by mouth daily. 30 tablet 3 06/03/2016 at Unknown time  . LORazepam (ATIVAN) 1 MG tablet Take 1 tablet (1 mg total) by mouth daily. (Patient taking differently: Take 1 mg by mouth daily as needed for anxiety. ) 30 tablet 1 Past Week at Unknown time  . metroNIDAZOLE (FLAGYL) 500 MG tablet Take 500 mg by mouth 3 (three) times daily.   06/03/2016 at Unknown time  . Multiple Vitamin (MULTIVITAMIN WITH MINERALS) TABS tablet Take 1 tablet by mouth daily.   Past Week at Unknown time  . nicotine (NICODERM CQ - DOSED IN MG/24 HOURS) 21 mg/24hr patch Place 1 patch (21 mg total) onto the skin daily. (Patient taking differently: Place 21 mg onto the skin daily as needed (when is hospital). ) 28 patch 0 last month  . Simethicone (GAS-X PO) Take 1 tablet by mouth daily as needed (gas).   unk  . thiamine (VITAMIN B-1) 100 MG tablet Take 100 mg by mouth daily.   06/03/2016 at Unknown time  . valsartan (DIOVAN) 160 MG tablet TAKE 1 TABLET (160 MG TOTAL) BY MOUTH DAILY. 90 tablet 1 06/03/2016 at Unknown time  . amLODipine (NORVASC) 10 MG tablet Take 1 tablet (10 mg total) by mouth daily. (Patient not taking: Reported on 06/04/2016) 30 tablet 2 Not Taking at Unknown time   Scheduled: . amLODipine  10 mg Oral Daily  . enoxaparin (LOVENOX) injection   40 mg Subcutaneous QHS  . folic acid  1 mg Oral Daily  . LORazepam  0-4 mg Intravenous Q6H   Followed by  . [START ON 06/07/2016] LORazepam  0-4 mg Intravenous Q12H  . multivitamin with minerals  1 tablet Oral Daily  . nicotine  21 mg Transdermal Daily  . piperacillin-tazobactam (ZOSYN)  IV  3.375 g Intravenous Q8H  . sodium chloride flush  3 mL Intravenous Q12H  . thiamine  100 mg Oral Daily   Or  . thiamine  100 mg Intravenous Daily   Continuous: . 0.45 % NaCl with KCl 20 mEq / L 100 mL/hr at 06/05/16  1505   EVO:[id number] **OR** acetaminophen, HYDROmorphone (DILAUDID) injection, LORazepam **OR** LORazepam, ondansetron **OR** ondansetron (ZOFRAN) IV Anti-infectives    Start     Dose/Rate Route Frequency Ordered Stop   06/05/16 0600  metroNIDAZOLE (FLAGYL) IVPB 500 mg  Status:  Discontinued     500 mg 100 mL/hr over 60 Minutes Intravenous Every 8 hours 06/04/16 2310 06/05/16 0954   06/05/16 0400  piperacillin-tazobactam (ZOSYN) IVPB 3.375 g     3.375 g 12.5 mL/hr over 240 Minutes Intravenous Every 8 hours 06/04/16 2310     06/04/16 1900  piperacillin-tazobactam (ZOSYN) IVPB 3.375 g     3.375 g 100 mL/hr over 30 Minutes Intravenous  Once 06/04/16 1853 06/04/16 1943   06/04/16 1900  metroNIDAZOLE (FLAGYL) IVPB 500 mg     500 mg 100 mL/hr over 60 Minutes Intravenous  Once 06/04/16 1853 06/04/16 2015      Results for orders placed or performed during the hospital encounter of 06/04/16 (from the past 48 hour(s))  Lipase, blood     Status: None   Collection Time: 06/04/16  1:09 PM  Result Value Ref Range   Lipase 32 11 - 51 U/L  Comprehensive metabolic panel     Status: Abnormal   Collection Time: 06/04/16  1:09 PM  Result Value Ref Range   Sodium 137 135 - 145 mmol/L   Potassium 3.3 (L) 3.5 - 5.1 mmol/L   Chloride 100 (L) 101 - 111 mmol/L   CO2 25 22 - 32 mmol/L   Glucose, Bld 101 (H) 65 - 99 mg/dL   BUN <5 (L) 6 - 20 mg/dL   Creatinine, Ser 0.49 (L) 0.61 - 1.24 mg/dL    Calcium 8.7 (L) 8.9 - 10.3 mg/dL   Total Protein 6.9 6.5 - 8.1 g/dL   Albumin 3.0 (L) 3.5 - 5.0 g/dL   AST 68 (H) 15 - 41 U/L   ALT 29 17 - 63 U/L   Alkaline Phosphatase 156 (H) 38 - 126 U/L   Total Bilirubin 0.2 (L) 0.3 - 1.2 mg/dL   GFR calc non Af Amer >60 >60 mL/min   GFR calc Af Amer >60 >60 mL/min    Comment: (NOTE) The eGFR has been calculated using the CKD EPI equation. This calculation has not been validated in all clinical situations. eGFR's persistently <60 mL/min signify possible Chronic Kidney Disease.    Anion gap 12 5 - 15  CBC     Status: None   Collection Time: 06/04/16  1:09 PM  Result Value Ref Range   WBC 5.2 4.0 - 10.5 K/uL   RBC 4.78 4.22 - 5.81 MIL/uL   Hemoglobin 15.7 13.0 - 17.0 g/dL   HCT 44.7 39.0 - 52.0 %   MCV 93.5 78.0 - 100.0 fL   MCH 32.8 26.0 - 34.0 pg   MCHC 35.1 30.0 - 36.0 g/dL   RDW 14.8 11.5 - 15.5 %   Platelets 268 150 - 400 K/uL  Urinalysis, Routine w reflex microscopic     Status: Abnormal   Collection Time: 06/04/16  7:40 PM  Result Value Ref Range   Color, Urine YELLOW YELLOW   APPearance CLEAR CLEAR   Specific Gravity, Urine 1.031 (H) 1.005 - 1.030   pH 7.5 5.0 - 8.0   Glucose, UA NEGATIVE NEGATIVE mg/dL   Hgb urine dipstick NEGATIVE NEGATIVE   Bilirubin Urine NEGATIVE NEGATIVE   Ketones, ur NEGATIVE NEGATIVE mg/dL   Protein, ur NEGATIVE NEGATIVE mg/dL   Nitrite NEGATIVE NEGATIVE  Leukocytes, UA MODERATE (A) NEGATIVE  Urine microscopic-add on     Status: Abnormal   Collection Time: 06/04/16  7:40 PM  Result Value Ref Range   Squamous Epithelial / LPF 0-5 (A) NONE SEEN   WBC, UA 6-30 0 - 5 WBC/hpf   RBC / HPF NONE SEEN 0 - 5 RBC/hpf   Bacteria, UA MANY (A) NONE SEEN  Magnesium     Status: Abnormal   Collection Time: 06/05/16  1:38 AM  Result Value Ref Range   Magnesium 1.1 (L) 1.7 - 2.4 mg/dL  Comprehensive metabolic panel     Status: Abnormal   Collection Time: 06/05/16  1:39 AM  Result Value Ref Range   Sodium 139  135 - 145 mmol/L   Potassium 2.8 (L) 3.5 - 5.1 mmol/L   Chloride 105 101 - 111 mmol/L   CO2 22 22 - 32 mmol/L   Glucose, Bld 106 (H) 65 - 99 mg/dL   BUN <5 (L) 6 - 20 mg/dL   Creatinine, Ser 0.51 (L) 0.61 - 1.24 mg/dL   Calcium 7.8 (L) 8.9 - 10.3 mg/dL   Total Protein 5.9 (L) 6.5 - 8.1 g/dL   Albumin 2.7 (L) 3.5 - 5.0 g/dL   AST 53 (H) 15 - 41 U/L   ALT 24 17 - 63 U/L   Alkaline Phosphatase 126 38 - 126 U/L   Total Bilirubin 0.5 0.3 - 1.2 mg/dL   GFR calc non Af Amer >60 >60 mL/min   GFR calc Af Amer >60 >60 mL/min    Comment: (NOTE) The eGFR has been calculated using the CKD EPI equation. This calculation has not been validated in all clinical situations. eGFR's persistently <60 mL/min signify possible Chronic Kidney Disease.    Anion gap 12 5 - 15  CBC     Status: Abnormal   Collection Time: 06/05/16  1:39 AM  Result Value Ref Range   WBC 7.9 4.0 - 10.5 K/uL   RBC 4.18 (L) 4.22 - 5.81 MIL/uL   Hemoglobin 13.4 13.0 - 17.0 g/dL   HCT 39.8 39.0 - 52.0 %   MCV 95.2 78.0 - 100.0 fL   MCH 32.1 26.0 - 34.0 pg   MCHC 33.7 30.0 - 36.0 g/dL   RDW 14.9 11.5 - 15.5 %   Platelets 219 150 - 400 K/uL    Ct Abdomen Pelvis W Contrast  Result Date: 06/04/2016 CLINICAL DATA:  Intermittent upper abdominal pain since February. EXAM: CT ABDOMEN AND PELVIS WITH CONTRAST TECHNIQUE: Multidetector CT imaging of the abdomen and pelvis was performed using the standard protocol following bolus administration of intravenous contrast. CONTRAST:  19m ISOVUE-300 IOPAMIDOL (ISOVUE-300) INJECTION 61% COMPARISON:  04/04/2016 FINDINGS: Lower chest:  Unremarkable. Hepatobiliary: The liver shows diffusely decreased attenuation suggesting steatosis. Mild gallbladder wall thickening or pericholecystic fluid is evident. No definite stones in the gallbladder. No intrahepatic or extrahepatic biliary dilation. Pancreas: No focal mass lesion. No dilatation of the main duct. No intraparenchymal cyst. No peripancreatic  edema. Spleen: No splenomegaly. No focal mass lesion. Adrenals/Urinary Tract: No adrenal nodule or mass. Kidneys are unremarkable. No evidence for hydroureter. Gas is identified in the urinary bladder. Left bladder wall thickening is evident. Stomach/Bowel: Stomach is nondistended. No gastric wall thickening. No evidence of outlet obstruction. Duodenum is normally positioned as is the ligament of Treitz. No small bowel wall thickening. No small bowel dilatation. The terminal ileum is normal. The appendix is not visualized, but there is no edema or inflammation in the region  of the cecum. As seen on previous imaging, there are diverticular changes in the left colon. Proximal sigmoid segment shows ill-defined wall thickening with pericolonic edema/ inflammation. Several small loculated of gas are seen in the sigmoid mesocolon consistent with micro perforation. Small bowel loops are tethered into the left lower quadrant inflammatory process. Coronal imaging shows bladder wall thickening contiguous with the abnormal colon with gas loculated between the colon in the bladder wall. Imaging features are highly suspicious for colovesical fistula. Vascular/Lymphatic: There is abdominal aortic atherosclerosis without aneurysm. There is no gastrohepatic or hepatoduodenal ligament lymphadenopathy. No intraperitoneal or retroperitoneal lymphadenopathy. Small pelvic sidewall lymph nodes are stable. Reproductive: Prostate gland is enlarged. Other: No substantial intraperitoneal free fluid. Musculoskeletal: Bone windows reveal no worrisome lytic or sclerotic osseous lesions. IMPRESSION: 1. Persistent inflammatory changes in the left lower quadrant with extraluminal gas in the sigmoid mesocolon as before, suggesting chronic micro perforation due to sigmoid colon diverticulitis. No drainable abscess at this time. As neoplasm can present with similar imaging features, correlation with colorectal cancer screening history recommended. 2.  Inflammatory changes in the sigmoid colon are associated with gas in the wall of the bladder and asymmetric bladder wall thickening along the region in contact with the inflamed colon. Gas in the bladder lumen is noted. Imaging features are compatible with colovesical fistula. 3. Abdominal aortic atherosclerosis. 4. Mild pericholecystic fluid or gallbladder wall thickening. This may be a secondary finding. Ultrasound could be used to further evaluate as clinically warranted. No biliary dilatation. Electronically Signed   By: Misty Stanley M.D.   On: 06/04/2016 17:33   US Abdomen Limited  Result Date: 06/04/2016 CLINICAL DATA:  Elevated LFTs. EXAM: US ABDOMEN LIMITED - RIGHT UPPER QUADRANT COMPARISON:  CT scan from earlier today. FINDINGS: Gallbladder: Gallbladder is distended without gallbladder wall thickening or pericholecystic fluid by ultrasound. No evidence for gallstones. Sludge is visualized in the lumen. The sonographer reports no sonographic Murphy sign. Common bile duct: Diameter: 5 Liver: Coarsened echotexture with poor acoustic through transmission suggests fatty deposition. IMPRESSION: No acute findings. Electronically Signed   By: Misty Stanley M.D.   On: 06/04/2016 20:16    Review of Systems  Constitutional: Positive for weight loss (36 pounds since February). Negative for chills, diaphoresis and fever.  HENT: Negative.   Eyes: Negative.   Respiratory: Negative.   Cardiovascular: Negative.   Gastrointestinal: Positive for abdominal pain (Abdominal pain is left lower quadrant, same place at each time, goes to the mid abdomen . He also had some right upper quadrant pain on admission but this has resolved.), constipation (Daily constipation takes Metamucil or MiraLAX almost daily.), diarrhea (After treating himself for constipation.) and nausea (Some nausea with onset of pain). Negative for heartburn and vomiting.  Genitourinary:       He notes pneumaturia with voiding.  Musculoskeletal:  Negative.   Skin: Negative.   Neurological: Positive for tremors (He believes tremors are related to his panic and anxiety symptoms.). Negative for sensory change, speech change, focal weakness, seizures, loss of consciousness and weakness.  Endo/Heme/Allergies: Negative.   Psychiatric/Behavioral: The patient is nervous/anxious.        He says he wakes up daily in the early a.m. and has panic attacks. He says alcohol is his only medication for pain control. He doesn't claim to be depressed but notes his wife lives in [place]. He is essentially alone at home with no family support.   Blood pressure (!) 178/86, pulse 89, temperature 98.4 F (36.9 C),  temperature source Oral, resp. rate 17, height '5\' 9"'$  (1.753 m), weight 73.8 kg (162 lb 11.2 oz), SpO2 98 %. Physical Exam  Constitutional: He is oriented to person, place, and time. He appears well-developed and well-nourished. No distress.  He is extremely tremulous both on the left and right side. He reports is fairly, and attributes it to anxiety. He notes he has not eaten well for a week.  HENT:  Head: Normocephalic and atraumatic.  Nose: Nose normal.  Eyes: Right eye exhibits no discharge. Left eye exhibits no discharge. No scleral icterus.  Neck: Normal range of motion. Neck supple. No JVD present. No tracheal deviation present. No thyromegaly present.  Cardiovascular: Regular rhythm and intact distal pulses.   Murmur heard. History tachycardic, it sounds regular. He is unaware of palpitations during the exam.  Respiratory: Effort normal and breath sounds normal. No respiratory distress. He has no wheezes. He has no rales. He exhibits no tenderness.  GI: Soft. Bowel sounds are normal. He exhibits no distension and no mass. There is no tenderness. There is no rebound and no guarding.  No pain sitting up. He has minimal pain left lower quadrant on exam and attributes this to recent pain medication.  Musculoskeletal: He exhibits no  edema.  Lymphadenopathy:    He has no cervical adenopathy.  Neurological: He is alert and oriented to person, place, and time. A cranial nerve deficit is present.  He has upper extremity tremors bilaterally. He seems to be able to control them intermittently especially on the left side.  Skin: Skin is warm and dry. No rash noted. He is not diaphoretic. No erythema. No pallor.  Psychiatric: He has a normal mood and affect. His behavior is normal. Judgment and thought content normal.    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 Malnutrition and deconditioning. History of anxiety and panic attacks Hypertension Ongoing tobacco use Remote history of migraines  Plan:  He is currently on Zosyn and the CIWA protocol. His prior antibiotic therapy, appear to be mostly Cipro and Flagyl. I agree with placing him on Zosyn. We will review CT findings with Dr. Redmond Pulling. It is unclear if he's ever completed his home course of antibiotics with the last 2 hospitalizations. He has had follow-up appointments with Dr. Marcello Moores but is never been able to complete them. He has issues with ongoing tobacco and alcohol use; ongoing issues with anxiety and panic attacks. We will review and follow with you.  Alvin Benton 06/05/2016, 3:09 PM

## 2016-06-05 NOTE — Progress Notes (Signed)
NURSING PROGRESS NOTE  Roselee NovaScott Boonstra [id number] Admission Data: 06/05/2016 3:09 AM Attending Provider: Delano Metzobert Schertz, MD [id number]:Katherine Beverely Lowabori, MD Code Status: Full  Allergies:  Review of patient's allergies indicates no known allergies. Past Medical History:   has a past medical history of Blood in stool; Diverticulitis; Hypertension; IBS (irritable bowel syndrome); and Migraine. Past Surgical History:   has a past surgical history that includes Colonoscopy and Tonsillectomy. Social History:   reports that he has been smoking Cigarettes.  He has a 40.00 pack-year smoking history. He has never used smokeless tobacco. He reports that he drinks about 25.2 oz of alcohol per week . He reports that he does not use drugs.  Roselee NovaScott Edmister is a 65 y.o. male patient admitted from ED:   Last Documented Vital Signs: Blood pressure (!) 165/90, pulse (!) 120, temperature 97.7 F (36.5 C), temperature source Oral, resp. rate 17, height 5\' 9"  (1.753 m), weight 73.4 kg (161 lb 13.1 oz), SpO2 99 %.   IV Fluids:  IV in place, occlusive dsg intact without redness, IV cath forearm right, condition patent and no redness.   Skin: Appropriate for ethnicity and intact except for abrasion on right knee.  Patient orientated to room. Information packet given to patient. Admission inpatient armband information verified with patient to include name and date of birth and placed on patient arm. Side rails up x 2, fall assessment and education completed with patient. Patient able to verbalize understanding of risk associated with falls and verbalized understanding to call for assistance before getting out of bed. Call light within reach. Patient able to voice and demonstrate understanding of unit orientation instructions.    Will continue to evaluate and treat per MD orders.  Sue LushKaelin Romesberg RN, BSN

## 2016-06-05 NOTE — Progress Notes (Signed)
PROGRESS NOTE    Alvin Benton  ZOX:[id number] DOB: [birth year] DOA: 06/04/2016 PCP: Neena Rhymes, MD  Brief Narrative: Alvin Benton is a 65 y.o. male with history of etoh abuse, HTN and tobacco use has had two admissions this year for diverticulitis, first in May then again in June.  Was here in July and didn't appear to have infection but was treated for DT's.  Now presents with abd pain LLQ for several days , and also some RUQ pain.  CT abd in ED shows "chronic micro perforation due to sigmoid colon diverticulitis", no drainable abscess and prob colovesical fistula.  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 -continue clears, IV Zosyn -pt not reliable with etoh history to f/u in outpatient setting, [facility] consulted -h/o colonoscopy in 2015 with mod diverticulosis  2  Etoh abuse with active withdrawal/ tremors - -ativan per  CIWA protocol -IVF, thiamine  3  HTN - cont norvasc , hold ARB  4  Dehydration -IVF, due to chronic alcoholism and above GI issues  5 Hypokalemia -replaced  6 Asymptomatic Pyuria - -on Abx for above  DVT prophylaxis:lovenox Code Status:Full Code Family Communication: none at bedside Disposition Plan: Home when improved, ?2days   Consultants:  CCS  Antimicrobials: Zosyn   Subjective: Sweating, mild Lower abd pain  Objective: Vitals:   06/04/16 2215 06/04/16 2230 06/04/16 2258 06/05/16 0541  BP: 150/85 145/81 (!) 165/90 (!) 148/82  Pulse: 94 90 (!) 120 (!) 104  Resp:  Temp:   97.7 F (36.5 C) 98.9 F (37.2 C)  TempSrc:   Oral Oral  SpO2: 98% 99% 99% 97%  Weight:   73.4 kg (161 lb 13.1 oz) 73.8 kg (162 lb 11.2 oz)  Height:    (1.753 m)     Intake/Output Summary (Last 24 hours) at 06/05/16 1325 Last data filed at 06/05/16 1308  Gross per 24 hour  Intake          3218.33 ml  Output             1275 ml  Net          1943.33 ml     Filed Weights   06/04/16 1246 06/04/16 2258 06/05/16 0541  Weight: 75.3 kg (166 lb) 73.4 kg (161 lb 13.1 oz) 73.8 kg (162 lb 11.2 oz)    Examination:  General exam: tremulous, anxious, sweating Respiratory system: Clear to auscultation. Respiratory effort normal. Cardiovascular system: S1 & S2 heard, RRR. No JVD, murmurs, rubs, gallops or clicks. No pedal edema. Gastrointestinal system: Abdomen is nondistended, soft and nontender. No organomegaly or masses felt. Normal bowel sounds heard. Central nervous system: tremors, withdrawals noted Extremities: Symmetric 5 x 5 power. Skin: No rashes, lesions or ulcers Psychiatry: Judgement and insight appear normal. Mood & affect appropriate.     Data Reviewed: I have personally reviewed following labs and imaging studies  CBC:  Recent Labs Lab 06/04/16 1309 06/05/16 0139  WBC 5.2 7.9  HGB 15.7 13.4  HCT 44.7 39.8  MCV 93.5 95.2  PLT 268 219   Basic Metabolic Panel:  Recent Labs Lab 06/04/16 1309 06/05/16 0138 06/05/16 0139  NA 137  --  139  K 3.3*  --  2.8*  CL 100*  --  105  CO2 25  --  22  GLUCOSE 101*  --  106*  BUN <5*  --  <  5*  CREATININE 0.49*  --  0.51*  CALCIUM 8.7*  --  7.8*  MG  --  1.1*  --    GFR: Estimated Creatinine Clearance: 94.5 mL/min (by C-G formula based on SCr of 0.8 mg/dL). Liver Function Tests:  Recent Labs Lab 06/04/16 1309 06/05/16 0139  AST 68* 53*  ALT 29 24  ALKPHOS 156* 126  BILITOT 0.2* 0.5  PROT 6.9 5.9*  ALBUMIN 3.0* 2.7*    Recent Labs Lab 06/04/16 1309  LIPASE 32   No results for input(s): AMMONIA in the last 168 hours. Coagulation Profile: No results for input(s): INR, PROTIME in the last 168 hours. Cardiac Enzymes: No results for input(s): CKTOTAL, CKMB, CKMBINDEX, TROPONINI in the last 168 hours. BNP (last 3 results) No results for input(s): PROBNP in the last 8760 hours. HbA1C: No results for input(s): HGBA1C in the last 72 hours. CBG: No results for  input(s): GLUCAP in the last 168 hours. Lipid Profile: No results for input(s): CHOL, HDL, LDLCALC, TRIG, CHOLHDL, LDLDIRECT in the last 72 hours. Thyroid Function Tests: No results for input(s): TSH, T4TOTAL, FREET4, T3FREE, THYROIDAB in the last 72 hours. Anemia Panel: No results for input(s): VITAMINB12, FOLATE, FERRITIN, TIBC, IRON, RETICCTPCT in the last 72 hours. Urine analysis:    Component Value Date/Time   COLORURINE YELLOW 06/04/2016 1940   APPEARANCEUR CLEAR 06/04/2016 1940   LABSPEC 1.031 (H) 06/04/2016 1940   PHURINE 7.5 06/04/2016 1940   GLUCOSEU NEGATIVE 06/04/2016 1940   HGBUR NEGATIVE 06/04/2016 1940   BILIRUBINUR NEGATIVE 06/04/2016 1940   KETONESUR NEGATIVE 06/04/2016 1940   PROTEINUR NEGATIVE 06/04/2016 1940   NITRITE NEGATIVE 06/04/2016 1940   LEUKOCYTESUR MODERATE (A) 06/04/2016 1940   Sepsis Labs: @LABRCNTIP (procalcitonin:4,lacticidven:4)  )No results found for this or any previous visit (from the past 240 hour(s)).       Radiology Studies: Ct Abdomen Pelvis W Contrast  Result Date: 06/04/2016 CLINICAL DATA:  Intermittent upper abdominal pain since February. EXAM: CT ABDOMEN AND PELVIS WITH CONTRAST TECHNIQUE: Multidetector CT imaging of the abdomen and pelvis was performed using the standard protocol following bolus administration of intravenous contrast. CONTRAST:  100mL ISOVUE-300 IOPAMIDOL (ISOVUE-300) INJECTION 61% COMPARISON:  04/04/2016 FINDINGS: Lower chest:  Unremarkable. Hepatobiliary: The liver shows diffusely decreased attenuation suggesting steatosis. Mild gallbladder wall thickening or pericholecystic fluid is evident. No definite stones in the gallbladder. No intrahepatic or extrahepatic biliary dilation. Pancreas: No focal mass lesion. No dilatation of the main duct. No intraparenchymal cyst. No peripancreatic edema. Spleen: No splenomegaly. No focal mass lesion. Adrenals/Urinary Tract: No adrenal nodule or mass. Kidneys are unremarkable. No  evidence for hydroureter. Gas is identified in the urinary bladder. Left bladder wall thickening is evident. Stomach/Bowel: Stomach is nondistended. No gastric wall thickening. No evidence of outlet obstruction. Duodenum is normally positioned as is the ligament of Treitz. No small bowel wall thickening. No small bowel dilatation. The terminal ileum is normal. The appendix is not visualized, but there is no edema or inflammation in the region of the cecum. As seen on previous imaging, there are diverticular changes in the left colon. Proximal sigmoid segment shows ill-defined wall thickening with pericolonic edema/ inflammation. Several small loculated of gas are seen in the sigmoid mesocolon consistent with micro perforation. Small bowel loops are tethered into the left lower quadrant inflammatory process. Coronal imaging shows bladder wall thickening contiguous with the abnormal colon with gas loculated between the colon in the bladder wall. Imaging features are highly suspicious for colovesical fistula. Vascular/Lymphatic: There  is abdominal aortic atherosclerosis without aneurysm. There is no gastrohepatic or hepatoduodenal ligament lymphadenopathy. No intraperitoneal or retroperitoneal lymphadenopathy. Small pelvic sidewall lymph nodes are stable. Reproductive: Prostate gland is enlarged. Other: No substantial intraperitoneal free fluid. Musculoskeletal: Bone windows reveal no worrisome lytic or sclerotic osseous lesions. IMPRESSION: 1. Persistent inflammatory changes in the left lower quadrant with extraluminal gas in the sigmoid mesocolon as before, suggesting chronic micro perforation due to sigmoid colon diverticulitis. No drainable abscess at this time. As neoplasm can present with similar imaging features, correlation with colorectal cancer screening history recommended. 2. Inflammatory changes in the sigmoid colon are associated with gas in the wall of the bladder and asymmetric bladder wall thickening  along the region in contact with the inflamed colon. Gas in the bladder lumen is noted. Imaging features are compatible with colovesical fistula. 3. Abdominal aortic atherosclerosis. 4. Mild pericholecystic fluid or gallbladder wall thickening. This may be a secondary finding. Ultrasound could be used to further evaluate as clinically warranted. No biliary dilatation. Electronically Signed   By: Kennith Center M.D.   On: 06/04/2016 17:33   US Abdomen Limited  Result Date: 06/04/2016 CLINICAL DATA:  Elevated LFTs. EXAM: US ABDOMEN LIMITED - RIGHT UPPER QUADRANT COMPARISON:  CT scan from earlier today. FINDINGS: Gallbladder: Gallbladder is distended without gallbladder wall thickening or pericholecystic fluid by ultrasound. No evidence for gallstones. Sludge is visualized in the lumen. The sonographer reports no sonographic Murphy sign. Common bile duct: Diameter: 5 Liver: Coarsened echotexture with poor acoustic through transmission suggests fatty deposition. IMPRESSION: No acute findings. Electronically Signed   By: Kennith Center M.D.   On: 06/04/2016 20:16        Scheduled Meds: . amLODipine  10 mg Oral Daily  . enoxaparin (LOVENOX) injection  40 mg Subcutaneous QHS  . folic acid  1 mg Oral Daily  . LORazepam  0-4 mg Intravenous Q6H   Followed by  . [START ON 06/07/2016] LORazepam  0-4 mg Intravenous Q12H  . multivitamin with minerals  1 tablet Oral Daily  . nicotine  21 mg Transdermal Daily  . piperacillin-tazobactam (ZOSYN)  IV  3.375 g Intravenous Q8H  . sodium chloride flush  3 mL Intravenous Q12H  . thiamine  100 mg Oral Daily   Or  . thiamine  100 mg Intravenous Daily   Continuous Infusions: . 0.45 % NaCl with KCl 20 mEq / L 100 mL/hr at 06/05/16 0103     LOS: 1 day    Time spent:    Zannie Cove, MD [facility] Pager [phone]  If 7PM-7AM, please contact night-coverage [email or web address] Password [email or web address] 06/05/2016, 1:25 PM

## 2016-06-05 NOTE — Progress Notes (Signed)
BP elevated 180/82 and 178/86. MD notified. New orders obtained for prn hydralazine.

## 2016-06-05 NOTE — Progress Notes (Signed)
Initial Nutrition Assessment  DOCUMENTATION CODES:   Not applicable  INTERVENTION:    Boost Breeze po TID, each supplement provides 250 kcal and 9 grams of protein  NUTRITION DIAGNOSIS:   Increased nutrient needs related to acute illness as evidenced by estimated needs  GOAL:   Patient will meet greater than or equal to 90% of their needs  MONITOR:   PO intake, Supplement acceptance, Labs, Weight trends, I & O's  REASON FOR ASSESSMENT:   Malnutrition Screening Tool  ASSESSMENT:   65 y.o. Male with history of etoh abuse, HTN and tobacco use has had two admissions this year for diverticulitis, first in May then again in June.  Was here in July and didn't appear to have infection but was treated for DT's.  Now presents with abd pain LLQ for several days , and also some RUQ pain.  CT abd in ED shows "chronic micro perforation due to sigmoid colon diverticulitis", no drainable abscess and prob colovesical fistula.  He has been unable to eat much the last 4-5 days.  Pt with active withdrawal/tremors>> on CIWA protocol. Currently in Clear Liquids >> PO intake 80% per flowsheet records. Per H&P, pt has had a decreased appetite PTA for about 4-5 days. Would benefit from oral nutrition supplements >> will order. Unable to complete Nutrition Focused Physical Exam at this time.  Diet Order:  Diet clear liquid Room service appropriate? Yes; Fluid consistency: Thin  Skin:  Reviewed, no issues  Last BM:  8/8  Height:   Ht Readings from Last 1 Encounters:  06/04/16 5\' 9"  (1.753 m)    Weight:   Wt Readings from Last 1 Encounters:  06/05/16 162 lb 11.2 oz (73.8 kg)    Wt Readings from Last 10 Encounters:  06/05/16 162 lb 11.2 oz (73.8 kg)  05/07/16 166 lb 6.4 oz (75.5 kg)  04/19/16 169 lb 8 oz (76.9 kg)  04/05/16 177 lb (80.3 kg)  03/14/16 169 lb (76.7 kg)  03/08/16 169 lb 2 oz (76.7 kg)  02/15/16 173 lb (78.5 kg)  07/17/15 195 lb (88.5 kg)  03/23/14 197 lb 6 oz (89.5  kg)  02/09/14 199 lb (90.3 kg)    Ideal Body Weight:  73 kg  BMI:  Body mass index is 24.03 kg/m.  Estimated Nutritional Needs:   Kcal:  1800-2000  Protein:  90-100 gm  Fluid:  1.8-2.0 L  EDUCATION NEEDS:   No education needs identified at this time  Maureen ChattersKatie Micaylah Bertucci, RD, LDN Pager #: [phone] After-Hours Pager #: [phone]

## 2016-06-06 LAB — COMPREHENSIVE METABOLIC PANEL
ALK PHOS: 132 U/L — AB (ref 38–126)
ALT: 26 U/L (ref 17–63)
ANION GAP: 9 (ref 5–15)
AST: 72 U/L — ABNORMAL HIGH (ref 15–41)
Albumin: 2.6 g/dL — ABNORMAL LOW (ref 3.5–5.0)
CHLORIDE: 99 mmol/L — AB (ref 101–111)
CO2: 26 mmol/L (ref 22–32)
Calcium: 8.2 mg/dL — ABNORMAL LOW (ref 8.9–10.3)
Creatinine, Ser: 0.58 mg/dL — ABNORMAL LOW (ref 0.61–1.24)
Glucose, Bld: 101 mg/dL — ABNORMAL HIGH (ref 65–99)
POTASSIUM: 3.3 mmol/L — AB (ref 3.5–5.1)
Sodium: 134 mmol/L — ABNORMAL LOW (ref 135–145)
Total Bilirubin: 1 mg/dL (ref 0.3–1.2)
Total Protein: 5.8 g/dL — ABNORMAL LOW (ref 6.5–8.1)

## 2016-06-06 LAB — CBC
HEMATOCRIT: 40 % (ref 39.0–52.0)
HEMOGLOBIN: 13.1 g/dL (ref 13.0–17.0)
MCH: 31.2 pg (ref 26.0–34.0)
MCHC: 32.8 g/dL (ref 30.0–36.0)
MCV: 95.2 fL (ref 78.0–100.0)
Platelets: 189 10*3/uL (ref 150–400)
RBC: 4.2 MIL/uL — ABNORMAL LOW (ref 4.22–5.81)
RDW: 14.8 % (ref 11.5–15.5)
WBC: 6.6 10*3/uL (ref 4.0–10.5)

## 2016-06-06 LAB — PREALBUMIN: PREALBUMIN: 27.7 mg/dL (ref 18–38)

## 2016-06-06 MED ORDER — OXYCODONE-ACETAMINOPHEN 5-325 MG PO TABS
2.0000 | ORAL_TABLET | Freq: Once | ORAL | Status: AC
  Administered 2016-06-06: 2 via ORAL
  Filled 2016-06-06: qty 2

## 2016-06-06 MED ORDER — SIMETHICONE 80 MG PO CHEW
160.0000 mg | CHEWABLE_TABLET | Freq: Four times a day (QID) | ORAL | Status: DC | PRN
Start: 2016-06-06 — End: 2016-06-09
  Administered 2016-06-06 – 2016-06-08 (×4): 160 mg via ORAL
  Filled 2016-06-06 (×4): qty 2

## 2016-06-06 MED ORDER — POTASSIUM CHLORIDE CRYS ER 20 MEQ PO TBCR
40.0000 meq | EXTENDED_RELEASE_TABLET | Freq: Once | ORAL | Status: AC
  Administered 2016-06-06: 40 meq via ORAL
  Filled 2016-06-06: qty 2

## 2016-06-06 NOTE — Progress Notes (Signed)
PROGRESS NOTE    Alvin Benton  ZOX:[id number] DOB: Jan 19, 1951 DOA: 06/04/2016 PCP: Neena Rhymes, MD  Brief Narrative: Alvin Benton is a 65 y.o. male with history of etoh abuse, HTN and tobacco use has had two admissions this year for diverticulitis, first in May then again in June.  Was here in July and didn't appear to have infection but was treated for DT's.  Now presents with abd pain LLQ for several days , and also some RUQ pain.  CT abd in ED shows "chronic micro perforation due to sigmoid colon diverticulitis", no drainable abscess and prob colovesical fistula.  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 -continue clears, IV Zosyn -pt not reliable with etoh history to f/u in outpatient setting, [facility] consulted -h/o colonoscopy in 2015 with mod diverticulosis -pt declines surgery at this time, will need close FU with [facility]  2  Etoh abuse with active withdrawal/ tremors - -ativan per  CIWA protocol -IVF, thiamine -more stable today  3  HTN - cont norvasc , hold ARB  4  Dehydration -IVF, due to chronic alcoholism and above GI issues  5 Hypokalemia -replaced  6 Asymptomatic Pyuria - -on Abx for above  DVT prophylaxis:lovenox Code Status:Full Code Family Communication: none at bedside Disposition Plan: Home when improved, ?2days   Consultants:  CCS  Antimicrobials: Zosyn   Subjective: Sweating, abd pain improving  Objective: Vitals:   06/05/16 2355 06/06/16 0500 06/06/16 0547 06/06/16 1341  BP: (!) 152/79  139/68 (!) 142/80  Pulse: 95  81 97  Resp: 16  17 17   Temp: 98.7 F (37.1 C)  98.7 F (37.1 C) 98.5 F (36.9 C)  TempSrc: Oral  Oral Oral  SpO2: 98%  99% 98%  Weight:  73.6 kg (162 lb 4.1 oz)    Height:        Intake/Output Summary (Last 24 hours) at 06/06/16 1403 Last data filed at 06/06/16 8119  Gross per 24 hour  Intake          1646.67 ml  Output              1850 ml  Net          -203.33 ml   Filed Weights   06/04/16 2258 06/05/16 0541 06/06/16 0500  Weight: 73.4 kg (161 lb 13.1 oz) 73.8 kg (162 lb 11.2 oz) 73.6 kg (162 lb 4.1 oz)    Examination:  General exam: tremulous, anxious, sweating Respiratory system: Clear to auscultation. Respiratory effort normal. Cardiovascular system: S1 & S2 heard, RRR. No JVD, murmurs, rubs, gallops or clicks. No pedal edema. Gastrointestinal system: Abdomen is nondistended, soft and nontender. No organomegaly or masses felt. Normal bowel sounds heard. Central nervous system: tremors, withdrawals noted Extremities: Symmetric 5 x 5 power. Skin: No rashes, lesions or ulcers Psychiatry: Judgement and insight appear normal. Mood & affect appropriate.     Data Reviewed: I have personally reviewed following labs and imaging studies  CBC:  Recent Labs Lab 06/04/16 1309 06/05/16 0139 06/06/16 0539  WBC 5.2 7.9 6.6  HGB 15.7 13.4 13.1  HCT 44.7 39.8 40.0  MCV 93.5 95.2 95.2  PLT 268 219 189   Basic Metabolic Panel:  Recent Labs Lab 06/04/16 1309 06/05/16 0138 06/05/16 0139 06/06/16 0539  NA 137  --  139 134*  K 3.3*  --  2.8* 3.3*  CL 100*  --  105 99*  CO2 25  --  22 26  GLUCOSE 101*  --  106* 101*  BUN <5*  --  <5* <5*  CREATININE 0.49*  --  0.51* 0.58*  CALCIUM 8.7*  --  7.8* 8.2*  MG  --  1.1*  --   --    GFR: Estimated Creatinine Clearance: 94.5 mL/min (by C-G formula based on SCr of 0.8 mg/dL). Liver Function Tests:  Recent Labs Lab 06/04/16 1309 06/05/16 0139 06/06/16 0539  AST 68* 53* 72*  ALT ALKPHOS 156* 126 132*  BILITOT 0.2* 0.5 1.0  PROT 6.9 5.9* 5.8*  ALBUMIN 3.0* 2.7* 2.6*    Recent Labs Lab 06/04/16 1309  LIPASE 32   No results for input(s): AMMONIA in the last 168 hours. Coagulation Profile: No results for input(s): INR, PROTIME in the last 168 hours. Cardiac Enzymes: No results for input(s): CKTOTAL, CKMB, CKMBINDEX, TROPONINI in the  last 168 hours. BNP (last 3 results) No results for input(s): PROBNP in the last 8760 hours. HbA1C: No results for input(s): HGBA1C in the last 72 hours. CBG: No results for input(s): GLUCAP in the last 168 hours. Lipid Profile: No results for input(s): CHOL, HDL, LDLCALC, TRIG, CHOLHDL, LDLDIRECT in the last 72 hours. Thyroid Function Tests: No results for input(s): TSH, T4TOTAL, FREET4, T3FREE, THYROIDAB in the last 72 hours. Anemia Panel: No results for input(s): VITAMINB12, FOLATE, FERRITIN, TIBC, IRON, RETICCTPCT in the last 72 hours. Urine analysis:    Component Value Date/Time   COLORURINE YELLOW 06/04/2016 1940   APPEARANCEUR CLEAR 06/04/2016 1940   LABSPEC 1.031 (H) 06/04/2016 1940   PHURINE 7.5 06/04/2016 1940   GLUCOSEU NEGATIVE 06/04/2016 1940   HGBUR NEGATIVE 06/04/2016 1940   BILIRUBINUR NEGATIVE 06/04/2016 1940   KETONESUR NEGATIVE 06/04/2016 1940   PROTEINUR NEGATIVE 06/04/2016 1940   NITRITE NEGATIVE 06/04/2016 1940   LEUKOCYTESUR MODERATE (A) 06/04/2016 1940   Sepsis Labs: (procalcitonin:4,lacticidven:4)  ) Recent Results (from the past 240 hour(s))  Urine culture     Status: None (Preliminary result)   Collection Time: 06/04/16  7:40 PM  Result Value Ref Range Status   Specimen Description URINE, RANDOM  Final   Special Requests NONE  Final   Culture CULTURE REINCUBATED FOR BETTER GROWTH  Final   Report Status PENDING  Incomplete         Radiology Studies: Ct Abdomen Pelvis W Contrast  Result Date: 06/04/2016 CLINICAL DATA:  Intermittent upper abdominal pain since February. EXAM: CT ABDOMEN AND PELVIS WITH CONTRAST TECHNIQUE: Multidetector CT imaging of the abdomen and pelvis was performed using the standard protocol following bolus administration of intravenous contrast. CONTRAST:  ISOVUE-300 IOPAMIDOL (ISOVUE-300) INJECTION 61% COMPARISON:  04/04/2016 FINDINGS: Lower chest:  Unremarkable. Hepatobiliary: The liver shows diffusely  decreased attenuation suggesting steatosis. Mild gallbladder wall thickening or pericholecystic fluid is evident. No definite stones in the gallbladder. No intrahepatic or extrahepatic biliary dilation. Pancreas: No focal mass lesion. No dilatation of the main duct. No intraparenchymal cyst. No peripancreatic edema. Spleen: No splenomegaly. No focal mass lesion. Adrenals/Urinary Tract: No adrenal nodule or mass. Kidneys are unremarkable. No evidence for hydroureter. Gas is identified in the urinary bladder. Left bladder wall thickening is evident. Stomach/Bowel: Stomach is nondistended. No gastric wall thickening. No evidence of outlet obstruction. Duodenum is normally positioned as is the ligament of Treitz. No small bowel wall thickening. No small bowel dilatation. The terminal ileum is normal. The appendix is not visualized, but there is no  edema or inflammation in the region of the cecum. As seen on previous imaging, there are diverticular changes in the left colon. Proximal sigmoid segment shows ill-defined wall thickening with pericolonic edema/ inflammation. Several small loculated of gas are seen in the sigmoid mesocolon consistent with micro perforation. Small bowel loops are tethered into the left lower quadrant inflammatory process. Coronal imaging shows bladder wall thickening contiguous with the abnormal colon with gas loculated between the colon in the bladder wall. Imaging features are highly suspicious for colovesical fistula. Vascular/Lymphatic: There is abdominal aortic atherosclerosis without aneurysm. There is no gastrohepatic or hepatoduodenal ligament lymphadenopathy. No intraperitoneal or retroperitoneal lymphadenopathy. Small pelvic sidewall lymph nodes are stable. Reproductive: Prostate gland is enlarged. Other: No substantial intraperitoneal free fluid. Musculoskeletal: Bone windows reveal no worrisome lytic or sclerotic osseous lesions. IMPRESSION: 1. Persistent inflammatory changes in the  left lower quadrant with extraluminal gas in the sigmoid mesocolon as before, suggesting chronic micro perforation due to sigmoid colon diverticulitis. No drainable abscess at this time. As neoplasm can present with similar imaging features, correlation with colorectal cancer screening history recommended. 2. Inflammatory changes in the sigmoid colon are associated with gas in the wall of the bladder and asymmetric bladder wall thickening along the region in contact with the inflamed colon. Gas in the bladder lumen is noted. Imaging features are compatible with colovesical fistula. 3. Abdominal aortic atherosclerosis. 4. Mild pericholecystic fluid or gallbladder wall thickening. This may be a secondary finding. Ultrasound could be used to further evaluate as clinically warranted. No biliary dilatation. Electronically Signed   By: Kennith CenterEric  Mansell M.D.   On: 06/04/2016 17:33   [place] Abdomen Limited  Result Date: 06/04/2016 CLINICAL DATA:  Elevated LFTs. EXAM: US ABDOMEN LIMITED - RIGHT UPPER QUADRANT COMPARISON:  CT scan from earlier today. FINDINGS: Gallbladder: Gallbladder is distended without gallbladder wall thickening or pericholecystic fluid by ultrasound. No evidence for gallstones. Sludge is visualized in the lumen. The sonographer reports no sonographic Murphy sign. Common bile duct: Diameter: 5 Liver: Coarsened echotexture with poor acoustic through transmission suggests fatty deposition. IMPRESSION: No acute findings. Electronically Signed   By: Kennith CenterEric  Mansell M.D.   On: 06/04/2016 20:16        Scheduled Meds: . amLODipine  10 mg Oral Daily  . enoxaparin (LOVENOX) injection  40 mg Subcutaneous QHS  . feeding supplement  1 Container Oral TID BM  . folic acid  1 mg Oral Daily  . LORazepam  0-4 mg Intravenous Q6H   Followed by  . [START ON 06/07/2016] LORazepam  0-4 mg Intravenous Q12H  . multivitamin with minerals  1 tablet Oral Daily  . nicotine  21 mg Transdermal Daily  .  piperacillin-tazobactam (ZOSYN)  IV  3.375 g Intravenous Q8H  . sodium chloride flush  3 mL Intravenous Q12H  . thiamine  100 mg Oral Daily   Or  . thiamine  100 mg Intravenous Daily   Continuous Infusions: . 0.45 % NaCl with KCl 20 mEq / L 100 mL/hr at 06/06/16 1145     LOS: 2 days    Time spent: 35min    Zannie CovePreetha Lyris Hitchman, MD [facility] Pager [phone]  If 7PM-7AM, please contact night-coverage [email or web address] Password [email or web address] 06/06/2016, 2:03 PM

## 2016-06-06 NOTE — Progress Notes (Signed)
Subjective: Pt denies pain  Tolerating his diet   Objective: Vital signs in last 24 hours: Temp:  [98.4 F (36.9 C)-98.7 F (37.1 C)] 98.7 F (37.1 C) (08/10 0547) Pulse Rate:  [81-96] 81 (08/10 0547) Resp:  [16-17] 17 (08/10 0547) BP: (139-180)/(68-86) 139/68 (08/10 0547) SpO2:  [98 %-99 %] 99 % (08/10 0547) Weight:  [73.6 kg (162 lb 4.1 oz)] 73.6 kg (162 lb 4.1 oz) (08/10 0500) Last BM Date: 06/05/16  Intake/Output from previous day: 08/09 0701 - 08/10 0700 In: 2486.7 [P.O.:840; I.V.:1546.7; IV Piggyback:100] Out: 2150 [Urine:2150] Intake/Output this shift: No intake/output data recorded.  GI: soft NT no rebound or guarding   Lab Results:   Recent Labs  06/05/16 0139 06/06/16 0539  WBC 7.9 6.6  HGB 13.4 13.1  HCT 39.8 40.0  PLT 219 189   BMET  Recent Labs  06/05/16 0139 06/06/16 0539  NA 139 134*  K 2.8* 3.3*  CL 105 99*  CO2 22 26  GLUCOSE 106* 101*  BUN <5* <5*  CREATININE 0.51* 0.58*  CALCIUM 7.8* 8.2*   PT/INR No results for input(s): LABPROT, INR in the last 72 hours. ABG No results for input(s): PHART, HCO3 in the last 72 hours.  Invalid input(s): PCO2, PO2  Studies/Results: Ct Abdomen Pelvis W Contrast  Result Date: 06/04/2016 CLINICAL DATA:  Intermittent upper abdominal pain since February. EXAM: CT ABDOMEN AND PELVIS WITH CONTRAST TECHNIQUE: Multidetector CT imaging of the abdomen and pelvis was performed using the standard protocol following bolus administration of intravenous contrast. CONTRAST:  100mL ISOVUE-300 IOPAMIDOL (ISOVUE-300) INJECTION 61% COMPARISON:  04/04/2016 FINDINGS: Lower chest:  Unremarkable. Hepatobiliary: The liver shows diffusely decreased attenuation suggesting steatosis. Mild gallbladder wall thickening or pericholecystic fluid is evident. No definite stones in the gallbladder. No intrahepatic or extrahepatic biliary dilation. Pancreas: No focal mass lesion. No dilatation of the main duct. No intraparenchymal cyst. No  peripancreatic edema. Spleen: No splenomegaly. No focal mass lesion. Adrenals/Urinary Tract: No adrenal nodule or mass. Kidneys are unremarkable. No evidence for hydroureter. Gas is identified in the urinary bladder. Left bladder wall thickening is evident. Stomach/Bowel: Stomach is nondistended. No gastric wall thickening. No evidence of outlet obstruction. Duodenum is normally positioned as is the ligament of Treitz. No small bowel wall thickening. No small bowel dilatation. The terminal ileum is normal. The appendix is not visualized, but there is no edema or inflammation in the region of the cecum. As seen on previous imaging, there are diverticular changes in the left colon. Proximal sigmoid segment shows ill-defined wall thickening with pericolonic edema/ inflammation. Several small loculated of gas are seen in the sigmoid mesocolon consistent with micro perforation. Small bowel loops are tethered into the left lower quadrant inflammatory process. Coronal imaging shows bladder wall thickening contiguous with the abnormal colon with gas loculated between the colon in the bladder wall. Imaging features are highly suspicious for colovesical fistula. Vascular/Lymphatic: There is abdominal aortic atherosclerosis without aneurysm. There is no gastrohepatic or hepatoduodenal ligament lymphadenopathy. No intraperitoneal or retroperitoneal lymphadenopathy. Small pelvic sidewall lymph nodes are stable. Reproductive: Prostate gland is enlarged. Other: No substantial intraperitoneal free fluid. Musculoskeletal: Bone windows reveal no worrisome lytic or sclerotic osseous lesions. IMPRESSION: 1. Persistent inflammatory changes in the left lower quadrant with extraluminal gas in the sigmoid mesocolon as before, suggesting chronic micro perforation due to sigmoid colon diverticulitis. No drainable abscess at this time. As neoplasm can present with similar imaging features, correlation with colorectal cancer screening history  recommended. 2. Inflammatory changes  in the sigmoid colon are associated with gas in the wall of the bladder and asymmetric bladder wall thickening along the region in contact with the inflamed colon. Gas in the bladder lumen is noted. Imaging features are compatible with colovesical fistula. 3. Abdominal aortic atherosclerosis. 4. Mild pericholecystic fluid or gallbladder wall thickening. This may be a secondary finding. Ultrasound could be used to further evaluate as clinically warranted. No biliary dilatation. Electronically Signed   By: Kennith Center M.D.   On: 06/04/2016 17:33   US Abdomen Limited  Result Date: 06/04/2016 CLINICAL DATA:  Elevated LFTs. EXAM: US ABDOMEN LIMITED - RIGHT UPPER QUADRANT COMPARISON:  CT scan from earlier today. FINDINGS: Gallbladder: Gallbladder is distended without gallbladder wall thickening or pericholecystic fluid by ultrasound. No evidence for gallstones. Sludge is visualized in the lumen. The sonographer reports no sonographic Murphy sign. Common bile duct: Diameter: 5 Liver: Coarsened echotexture with poor acoustic through transmission suggests fatty deposition. IMPRESSION: No acute findings. Electronically Signed   By: Kennith Center M.D.   On: 06/04/2016 20:16    Anti-infectives: Anti-infectives    Start     Dose/Rate Route Frequency Ordered Stop   06/05/16 0600  metroNIDAZOLE (FLAGYL) IVPB 500 mg  Status:  Discontinued     500 mg 100 mL/hr over 60 Minutes Intravenous Every 8 hours 06/04/16 2310 06/05/16 0954   06/05/16 0400  piperacillin-tazobactam (ZOSYN) IVPB 3.375 g     3.375 g 12.5 mL/hr over 240 Minutes Intravenous Every 8 hours 06/04/16 2310     06/04/16 1900  piperacillin-tazobactam (ZOSYN) IVPB 3.375 g     3.375 g 100 mL/hr over 30 Minutes Intravenous  Once 06/04/16 1853 06/04/16 1943   06/04/16 1900  metroNIDAZOLE (FLAGYL) IVPB 500 mg     500 mg 100 mL/hr over 60 Minutes Intravenous  Once 06/04/16 1853 06/04/16 2015       Assessment/Plan: Patient Active Problem List   Diagnosis Date Noted  . Alcohol withdrawal (HCC) 06/04/2016  . Volume depletion 06/04/2016  . Hypokalemia 06/04/2016  . Diverticulitis 06/04/2016  . Essential hypertension   . Hypomagnesemia   . Ileus (HCC) 05/05/2016  . Systolic murmur 05/05/2016  . Anxiety state 04/19/2016  . 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 Hypoxia 04/05/2016  . Atrial fibrillation (HCC) 04/05/2016  . Diverticulitis of large intestine with perforation and abscess 03/14/2016  . Chronic constipation 03/08/2016  . Alcohol abuse 02/15/2016  . Tobacco use disorder 07/17/2015  . Allergic rhinitis 07/17/2015  . Reactive airway disease with wheezing 07/17/2015  . Overweight (BMI 25.0-29.9) 07/17/2015  . Hand pain, left 01/12/2014  . LLQ pain-chronic, intermittent and episodic 09/17/2013    Pt refuses surgery at this point  Will sign off   LOS: 2 days    Terrez Ander A. 06/06/2016

## 2016-06-06 NOTE — Care Management Note (Signed)
Case Management Note  Patient Details  Name: Alvin NovaScott Benton MRN: [id number] Date of Birth: 21-Feb-1951  Subjective/Objective:                 Patient admitted for diverticulitis. Spoke with patient at the bedside, he is actively withdrawing this am with tremors ativan given by nurse. Patient states he lives at home alone, still works as an Regulatory affairs officerinventory analyst, and would to be discharged so he can go back to work. Patient would like to speak to CSW about resources for ETOH rehab after DC. [name] CSW notified.   Action/Plan:  CM will continue to follow, no CM needs identified at this time.   Expected Discharge Date:                  Expected Discharge Plan:  Home/Self Care  In-House Referral:  Clinical Social Work  Discharge planning Services  CM Consult  Post Acute Care Choice:  NA Choice offered to:  NA  DME Arranged:  N/A DME Agency:  NA  HH Arranged:  NA HH Agency:  NA  Status of Service:  Completed, signed off  If discussed at Long Length of Stay Meetings, dates discussed:    Additional Comments:  [name], RN 06/06/2016, 9:45 AM

## 2016-06-07 LAB — BASIC METABOLIC PANEL
Anion gap: 11 (ref 5–15)
BUN: <5 mg/dL — ABNORMAL LOW (ref 6–20)
CALCIUM: 8.5 mg/dL — AB (ref 8.9–10.3)
CO2: 23 mmol/L (ref 22–32)
CREATININE: 0.56 mg/dL — AB (ref 0.61–1.24)
Chloride: 104 mmol/L (ref 101–111)
GFR calc Af Amer: >60 mL/min (ref >60)
GFR calc non Af Amer: >60 mL/min (ref >60)
GLUCOSE: 84 mg/dL (ref 65–99)
POTASSIUM: 3.4 mmol/L — AB (ref 3.5–5.1)
SODIUM: 138 mmol/L (ref 135–145)

## 2016-06-07 LAB — CBC
HCT: 41.5 % (ref 39.0–52.0)
Hemoglobin: 13.7 g/dL (ref 13.0–17.0)
MCH: 32.2 pg (ref 26.0–34.0)
MCHC: 33 g/dL (ref 30.0–36.0)
MCV: 97.6 fL (ref 78.0–100.0)
PLATELETS: 183 10*3/uL (ref 150–400)
RBC: 4.25 MIL/uL (ref 4.22–5.81)
RDW: 14.8 % (ref 11.5–15.5)
WBC: 7.2 10*3/uL (ref 4.0–10.5)

## 2016-06-07 MED ORDER — METRONIDAZOLE 500 MG PO TABS: 500.0000 mg | ORAL_TABLET | Freq: Three times a day (TID) | ORAL | Status: DC

## 2016-06-07 MED ORDER — LORAZEPAM 1 MG PO TABS
1.0000 mg | ORAL_TABLET | Freq: Four times a day (QID) | ORAL | Status: AC | PRN
  Administered 2016-06-07 – 2016-06-08 (×2): 1 mg via ORAL
  Filled 2016-06-07: qty 1

## 2016-06-07 MED ORDER — AMOXICILLIN-POT CLAVULANATE 875-125 MG PO TABS
1.0000 | ORAL_TABLET | Freq: Two times a day (BID) | ORAL | Status: DC
  Administered 2016-06-07 – 2016-06-09 (×5): 1 via ORAL
  Filled 2016-06-07 (×5): qty 1

## 2016-06-07 MED ORDER — POTASSIUM CHLORIDE CRYS ER 20 MEQ PO TBCR
40.0000 meq | EXTENDED_RELEASE_TABLET | Freq: Once | ORAL | Status: AC
  Administered 2016-06-07: 40 meq via ORAL
  Filled 2016-06-07: qty 2

## 2016-06-07 MED ORDER — LOPERAMIDE HCL 2 MG PO CAPS
2.0000 mg | ORAL_CAPSULE | ORAL | Status: DC | PRN
  Administered 2016-06-07 – 2016-06-08 (×2): 2 mg via ORAL
  Filled 2016-06-07 (×2): qty 1

## 2016-06-07 MED ORDER — CIPROFLOXACIN HCL 500 MG PO TABS: 500.0000 mg | ORAL_TABLET | Freq: Two times a day (BID) | ORAL | Status: DC

## 2016-06-07 NOTE — Evaluation (Signed)
Physical Therapy Evaluation Patient Details Name: Alvin Benton MRN: [id number] DOB: Sep 14, 1951 Today's Date: 06/07/2016   History of Present Illness  Patient is a 65 y/o male with hx of alcoholism, HTN and has had two admissions this year for diverticulitis, in May and June and recently discharged 4 weeks ago and tx for DTs presents with abdominal pain. CT abd-chronic micro perforation due to sigmoid colon diverticulitis", no drainable abscess and prob colovesical fistula  Clinical Impression  Patient presents with generalized weakness and balance deficits s/p above impacting mobility. Tolerated gait training and stair training with Min guard assist for safety due to instability. Encouraged pt to use SPC at home for stability until strength/balance improve. Anticipate once pt increases activity, mobility and safety will improve. Will follow acutely to maximize independence and mobility and perform higher level balance challenges prior to return home.    Follow Up Recommendations Supervision - Intermittent;No PT follow up    Equipment Recommendations  None recommended by PT    Recommendations for Other Services       Precautions / Restrictions Precautions Precautions: Fall Restrictions Weight Bearing Restrictions: No      Mobility  Bed Mobility Overal bed mobility: Needs Assistance Bed Mobility: Supine to Sit     Supine to sit: Modified independent (Device/Increase time);HOB elevated     General bed mobility comments: Increased time but no assist needed.  Transfers Overall transfer level: Needs assistance Equipment used: None Transfers: Sit to/from Stand Sit to Stand: Min guard         General transfer comment: Min guard for safety. Reaching for IV pole for support.   Ambulation/Gait Ambulation/Gait assistance: Min guard Ambulation Distance (Feet): 200 Feet Assistive device: None Gait Pattern/deviations: Step-through pattern;Decreased stride length;Staggering  left;Staggering right Gait velocity: decreased Gait velocity interpretation: Below normal speed for age/gender General Gait Details: Staggering gait pattern initially which improved with increased distance. Instability noted esp with higher level balance activities.   Stairs Stairs: Yes Stairs assistance: Min assist Stair Management: Alternating pattern;One rail Right;Two rails Number of Stairs: 3 General stair comments: 1 rail to ascend steps and 2 rails to descend steps. Impulsive requiring Min A for support.  Wheelchair Mobility    Modified Rankin (Stroke Patients Only)       Balance Overall balance assessment: Needs assistance Sitting-balance support: Feet supported;No upper extremity supported Sitting balance-Leahy Scale: Good Sitting balance - Comments: Able to reach outside BoS and donn socks without difficulty or LOB.   Standing balance support: During functional activity Standing balance-Leahy Scale: Fair               High level balance activites: Head turns;Sudden stops;Direction changes High Level Balance Comments: Tolerated above- head turns, stepping over objects with mild deviations in gait but no overt LOB.  Standardized Balance Assessment Standardized Balance Assessment : Dynamic Gait Index   Dynamic Gait Index Level Surface: Mild Impairment Gait with Horizontal Head Turns: Mild Impairment Gait with Vertical Head Turns: Mild Impairment Step Over Obstacle: Mild Impairment       Pertinent Vitals/Pain Pain Assessment: No/denies pain    Home Living Family/patient expects to be discharged to:: Private residence Living Arrangements: Alone   Type of Home: Apartment Home Access: Stairs to enter Entrance Stairs-Rails: Right Entrance Stairs-Number of Steps: 12 Home Layout: One level Home Equipment: Cane - single point      Prior Function Level of Independence: Independent         Comments: Works as Best boyfinancial analyst. Public librarianDrives.  Hand  Dominance        Extremity/Trunk Assessment   Upper Extremity Assessment: Defer to OT evaluation (tremulous BUEs)           Lower Extremity Assessment: Generalized weakness         Communication   Communication: No difficulties  Cognition Arousal/Alertness: Awake/alert Behavior During Therapy: WFL for tasks assessed/performed Overall Cognitive Status: No family/caregiver present to determine baseline cognitive functioning (Seens confused but stated "August, 2017 and knows he is in [place], [place]")                      General Comments      Exercises        Assessment/Plan    PT Assessment Patient needs continued PT services  PT Diagnosis Difficulty walking;Generalized weakness   PT Problem List Decreased strength;Decreased mobility;Decreased balance;Decreased cognition;Decreased activity tolerance  PT Treatment Interventions Therapeutic activities;Gait training;Therapeutic exercise;DME instruction;Patient/family education;Functional mobility training;Balance training;Stair training   PT Goals (Current goals can be found in the Care Plan section) Acute Rehab PT Goals Patient Stated Goal: to get up and walk when I want too PT Goal Formulation: With patient Time For Goal Achievement: 06/21/16 Potential to Achieve Goals: Fair    Frequency Min 3X/week   Barriers to discharge Inaccessible home environment flight of stairs to get into apt    Co-evaluation               End of Session Equipment Utilized During Treatment: Gait belt Activity Tolerance: Patient tolerated treatment well Patient left: in bed;with call bell/phone within reach;with bed alarm set Nurse Communication: Mobility status         Time: 1217-1232 PT Time Calculation (min) (ACUTE ONLY): 15 min   Charges:   PT Evaluation $PT Eval Low Complexity: 1 Procedure     PT G Codes:        Janalyn Higby A Anaston Koehn 06/07/2016, 1:25 PM Mylo Red, PT, DPT [phone]

## 2016-06-07 NOTE — Progress Notes (Signed)
PROGRESS NOTE    Alvin Benton  GEX:[id number] DOB: Apr 02, 1951 DOA: 06/04/2016 PCP: Neena Rhymes, MD  Brief Narrative: Alvin Benton is a 65 y.o. male with history of etoh abuse, HTN and tobacco use has had two admissions this year for diverticulitis, first in May then again in June.  Was here in July and didn't appear to have infection but was treated for DT's.  Now presents with abd pain LLQ for several days , and also some RUQ pain.  CT abd in ED shows "chronic micro perforation due to sigmoid colon diverticulitis", no drainable abscess and prob colovesical fistula.  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 -improving, advance diet, change IV Zosyn to Augmentin -pt not reliable with etoh history to f/u in outpatient setting, [facility] consulted, recommended colectomy, colostomy, bladder repair -h/o colonoscopy in 2015 with mod diverticulosis -pt declines surgery at this time, will need close FU with [facility] -ambulate, PT eval  2  Etoh abuse with active withdrawal/ tremors - -ativan per  CIWA protocol -IVF, thiamine -more stable today  3  HTN - cont norvasc , hold ARB  4  Dehydration -IVF, due to chronic alcoholism and above GI issues  5 Hypokalemia -replaced  6 Asymptomatic Pyuria - -on Abx for above  DVT prophylaxis:lovenox Code Status:Full Code Family Communication: none at bedside Disposition Plan: Home tomorrow if improved   Consultants:  CCS  Antimicrobials: Zosyn   Subjective: Sweating, abd pain improving  Objective: Vitals:   06/07/16 0020 06/07/16 0500 06/07/16 0621 06/07/16 1255  BP: (!) 154/73  (!) 142/83 (!) 168/84  Pulse: 76  81 (!) 108  Resp: Temp: 98.4 F (36.9 C)  98.6 F (37 C) 98.3 F (36.8 C)  TempSrc: Oral  Oral   SpO2: 95%  98% 99%  Weight:  71.5 kg (157 lb 10.1 oz)    Height:        Intake/Output Summary (Last 24 hours) at 06/07/16  1353 Last data filed at 06/07/16 1130  Gross per 24 hour  Intake              800 ml  Output              550 ml  Net              250 ml   Filed Weights   06/05/16 0541 06/06/16 0500 06/07/16 0500  Weight: 73.8 kg (162 lb 11.2 oz) 73.6 kg (162 lb 4.1 oz) 71.5 kg (157 lb 10.1 oz)    Examination:  General exam: tremors improving, more relaxed, AAOx3 Respiratory system: Clear to auscultation. Respiratory effort normal. Cardiovascular system: S1 & S2 heard, RRR. No JVD, murmurs, rubs, gallops or clicks. No pedal edema. Gastrointestinal system: Abdomen is nondistended, soft and nontender. No organomegaly or masses felt. Normal bowel sounds heard. Central nervous system: minimal tremor noted today Extremities: Symmetric 5 x 5 power. Skin: No rashes, lesions or ulcers Psychiatry: Judgement and insight appear normal. Mood & affect appropriate.     Data Reviewed: I have personally reviewed following labs and imaging studies  CBC:  Recent Labs Lab 06/04/16 1309 06/05/16 0139 06/06/16 0539 06/07/16 0405  WBC 5.2 7.9 6.6 7.2  HGB 15.7 13.4 13.1 13.7  HCT 44.7 39.8 40.0 41.5  MCV 93.5 95.2 95.2 97.6  PLT 268 219 189 183   Basic Metabolic Panel:  Recent Labs Lab 06/04/16 1309 06/05/16 0138 06/05/16 0139 06/06/16 0539 06/07/16 0405  NA 137  --  139 134* 138  K 3.3*  --  2.8* 3.3* 3.4*  CL 100*  --  105 99* 104  CO2 25  --  22 26 23   GLUCOSE 101*  --  106* 101* 84  BUN <5*  --  <5* <5* <5*  CREATININE 0.49*  --  0.51* 0.58* 0.56*  CALCIUM 8.7*  --  7.8* 8.2* 8.5*  MG  --  1.1*  --   --   --    GFR: Estimated Creatinine Clearance: 94.5 mL/min (by C-G formula based on SCr of 0.8 mg/dL). Liver Function Tests:  Recent Labs Lab 06/04/16 1309 06/05/16 0139 06/06/16 0539  AST 68* 53* 72*  ALT 29 24 26   ALKPHOS 156* 126 132*  BILITOT 0.2* 0.5 1.0  PROT 6.9 5.9* 5.8*  ALBUMIN 3.0* 2.7* 2.6*    Recent Labs Lab 06/04/16 1309  LIPASE 32   No results for  input(s): AMMONIA in the last 168 hours. Coagulation Profile: No results for input(s): INR, PROTIME in the last 168 hours. Cardiac Enzymes: No results for input(s): CKTOTAL, CKMB, CKMBINDEX, TROPONINI in the last 168 hours. BNP (last 3 results) No results for input(s): PROBNP in the last 8760 hours. HbA1C: No results for input(s): HGBA1C in the last 72 hours. CBG: No results for input(s): GLUCAP in the last 168 hours. Lipid Profile: No results for input(s): CHOL, HDL, LDLCALC, TRIG, CHOLHDL, LDLDIRECT in the last 72 hours. Thyroid Function Tests: No results for input(s): TSH, T4TOTAL, FREET4, T3FREE, THYROIDAB in the last 72 hours. Anemia Panel: No results for input(s): VITAMINB12, FOLATE, FERRITIN, TIBC, IRON, RETICCTPCT in the last 72 hours. Urine analysis:    Component Value Date/Time   COLORURINE YELLOW 06/04/2016 1940   APPEARANCEUR CLEAR 06/04/2016 1940   LABSPEC 1.031 (H) 06/04/2016 1940   PHURINE 7.5 06/04/2016 1940   GLUCOSEU NEGATIVE 06/04/2016 1940   HGBUR NEGATIVE 06/04/2016 1940   BILIRUBINUR NEGATIVE 06/04/2016 1940   KETONESUR NEGATIVE 06/04/2016 1940   PROTEINUR NEGATIVE 06/04/2016 1940   NITRITE NEGATIVE 06/04/2016 1940   LEUKOCYTESUR MODERATE (A) 06/04/2016 1940   Sepsis Labs: @LABRCNTIP (procalcitonin:4,lacticidven:4)  ) Recent Results (from the past 240 hour(s))  Urine culture     Status: None (Preliminary result)   Collection Time: 06/04/16  7:40 PM  Result Value Ref Range Status   Specimen Description URINE, RANDOM  Final   Special Requests NONE  Final   Culture CULTURE REINCUBATED FOR BETTER GROWTH  Final   Report Status PENDING  Incomplete         Radiology Studies: No results found.      Scheduled Meds: . amLODipine  10 mg Oral Daily  . amoxicillin-clavulanate  1 tablet Oral Q12H  . enoxaparin (LOVENOX) injection  40 mg Subcutaneous QHS  . feeding supplement  1 Container Oral TID BM  . folic acid  1 mg Oral Daily  . LORazepam  0-4  mg Intravenous Q12H  . multivitamin with minerals  1 tablet Oral Daily  . nicotine  21 mg Transdermal Daily  . potassium chloride  40 mEq Oral Once  . sodium chloride flush  3 mL Intravenous Q12H  . thiamine  100 mg Oral Daily   Or  . thiamine  100 mg Intravenous Daily   Continuous Infusions: . 0.45 % NaCl with KCl 20 mEq / L 75 mL/hr at 06/07/16 1321     LOS: 3  days    Time spent: 35min    Zannie CovePreetha Ovetta Bazzano, MD [facility] Pager [phone]  If 7PM-7AM, please contact night-coverage [email or web address] Password [email or web address] 06/07/2016, 1:53 PM

## 2016-06-07 NOTE — Significant Event (Signed)
Rapid Response Event Note  Overview: Time Called: 1658 Arrival Time: 1750 Event Type: Other (Comment)  Initial Focused Assessment:  Called by director to assess [phone], patient is experiencing withdrawal. Upon assessment, patient was resting quietly.   Interventions: Paged TRH about patient, will have continuous pulse ox on patient to monitor vitals while patient is receiving Ativan per CIWA. CIWA scores per RN have been 14-16  Plan of Care (if not transferred): will follow patient, advised RN to obtain VS and have them documented as well  Event Summary: Name of Physician Notified: Dr. Jomarie LongsJoseph at 1825    at       Event End Time: 1840  Alvin Benton, Alvin AlexanderUJA R

## 2016-06-07 NOTE — Care Management Important Message (Signed)
Important Message  Patient Details  Name: Alvin Benton MRN: [id number] Date of Birth: July 12, 1951   Medicare Important Message Given:  Yes    Bernadette HoitShoffner, Asher Torpey Coleman 06/07/2016, 1:32 PM

## 2016-06-08 DIAGNOSIS — F1010 Alcohol abuse, uncomplicated: Secondary | ICD-10-CM

## 2016-06-08 DIAGNOSIS — N321 Vesicointestinal fistula: Secondary | ICD-10-CM

## 2016-06-08 LAB — CBC
HCT: 41.8 % (ref 39.0–52.0)
Hemoglobin: 14 g/dL (ref 13.0–17.0)
MCH: 32.1 pg (ref 26.0–34.0)
MCHC: 33.5 g/dL (ref 30.0–36.0)
MCV: 95.9 fL (ref 78.0–100.0)
PLATELETS: 220 10*3/uL (ref 150–400)
RBC: 4.36 MIL/uL (ref 4.22–5.81)
RDW: 14.8 % (ref 11.5–15.5)
WBC: 14.2 10*3/uL — AB (ref 4.0–10.5)

## 2016-06-08 LAB — URINE CULTURE: Culture: >=100000 — AB

## 2016-06-08 LAB — BASIC METABOLIC PANEL
Anion gap: 9 (ref 5–15)
CALCIUM: 8.4 mg/dL — AB (ref 8.9–10.3)
CHLORIDE: 106 mmol/L (ref 101–111)
CO2: 20 mmol/L — AB (ref 22–32)
CREATININE: 0.51 mg/dL — AB (ref 0.61–1.24)
GFR calc Af Amer: >60 mL/min (ref >60)
GLUCOSE: 96 mg/dL (ref 65–99)
Potassium: 3.7 mmol/L (ref 3.5–5.1)
Sodium: 135 mmol/L (ref 135–145)

## 2016-06-08 MED ORDER — LORAZEPAM 2 MG/ML IJ SOLN
0.0000 mg | INTRAMUSCULAR | Status: DC
  Administered 2016-06-08: 2 mg via INTRAVENOUS
  Administered 2016-06-08 – 2016-06-09 (×3): 1 mg via INTRAVENOUS
  Administered 2016-06-09 (×2): 2 mg via INTRAVENOUS
  Filled 2016-06-08 (×5): qty 1
  Filled 2016-06-08: qty 2

## 2016-06-08 NOTE — Progress Notes (Signed)
PROGRESS NOTE    Alvin Benton  WUJ:[id number] DOB: 1951/01/11 DOA: 06/04/2016 PCP: Neena Rhymes, MD  Brief Narrative: Alvin Benton is a 65 y.o. male with history of etoh abuse, HTN and tobacco use has had two admissions this year for diverticulitis, first in May then again in June.  Was here in July and didn't appear to have infection but was treated for DT's.  Now presents with abd pain LLQ for several days , and also some RUQ pain.  CT abd in ED shows "chronic micro perforation due to sigmoid colon diverticulitis", no drainable abscess and prob colovesical fistula.  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 -improving, advanced diet, changed IV Zosyn to Augmentin -pt not reliable with etoh history to f/u in outpatient setting, [facility] consulted, recommended colectomy, colostomy, bladder repair -h/o colonoscopy in 2015 with mod diverticulosis -pt declines surgery at this time, will need close FU with [facility] -ambulate, PT eval completed -stable  2  Etoh abuse with active withdrawal/ tremors - -ativan per  CIWA protocol -IVF, thiamine -needs atleast another day of ativan for ETOH withdrawal  3  HTN - cont norvasc , hold ARB  4  Dehydration -IVF, due to chronic alcoholism and above GI issues  5 Hypokalemia -replaced  6 Asymptomatic Pyuria - -on Abx for above  DVT prophylaxis:lovenox Code Status:Full Code Family Communication: none at bedside Disposition Plan: Home tomorrow if improved   Consultants:  CCS  Antimicrobials: Zosyn   Subjective: tol diet, no abd pain, still with mod tremors/shakes etc   Objective: Vitals:   06/07/16 2007 06/08/16 0029 06/08/16 0233 06/08/16 0533  BP: (!) 146/72 (!) 166/92  (!) 149/82  Pulse: 90 88  (!) 101  Resp: Temp: 98.3 F (36.8 C) 98.1 F (36.7 C)  98.3 F (36.8 C)  TempSrc:      SpO2: 98% 100%  99%  Weight:   72 kg (158 lb  11.7 oz)   Height:        Intake/Output Summary (Last 24 hours) at 06/08/16 1117 Last data filed at 06/08/16 0856  Gross per 24 hour  Intake              750 ml  Output             2330 ml  Net            -1580 ml   Filed Weights   06/06/16 0500 06/07/16 0500 06/08/16 0233  Weight: 73.6 kg (162 lb 4.1 oz) 71.5 kg (157 lb 10.1 oz) 72 kg (158 lb 11.7 oz)    Examination:  General exam: tremors improving, more relaxed, AAOx3 Respiratory system: Clear to auscultation. Respiratory effort normal. Cardiovascular system: S1 & S2 heard, RRR. No JVD, murmurs, rubs, gallops or clicks. No pedal edema. Gastrointestinal system: Abdomen is nondistended, soft and nontender. No organomegaly or masses felt. Normal bowel sounds heard. Central nervous system: mild tremors noted today Extremities: Symmetric 5 x 5 power. Skin: No rashes, lesions or ulcers Psychiatry: Judgement and insight appear normal. Mood & affect appropriate.     Data Reviewed: I have personally reviewed following labs and imaging studies  CBC:  Recent Labs Lab 06/04/16 1309 06/05/16 0139 06/06/16 0539 06/07/16 0405 06/08/16 0730  WBC 5.2 7.9 6.6 7.2 14.2*  HGB 15.7 13.4 13.1 13.7 14.0  HCT 44.7 39.8 40.0 41.5 41.8  MCV 93.5 95.2 95.2 97.6 95.9  PLT 268 219 189 183 220   Basic Metabolic Panel:  Recent Labs Lab 06/04/16 1309 06/05/16 0138 06/05/16 0139 06/06/16 0539 06/07/16 0405 06/08/16 0730  NA 137  --  139 134* 138 135  K 3.3*  --  2.8* 3.3* 3.4* 3.7  CL 100*  --  105 99* 104 106  CO2 25  --  22 26 23  20*  GLUCOSE 101*  --  106* 101* 84 96  BUN <5*  --  <5* <5* <5* <5*  CREATININE 0.49*  --  0.51* 0.58* 0.56* 0.51*  CALCIUM 8.7*  --  7.8* 8.2* 8.5* 8.4*  MG  --  1.1*  --   --   --   --    GFR: Estimated Creatinine Clearance: 94.5 mL/min (by C-G formula based on SCr of 0.8 mg/dL). Liver Function Tests:  Recent Labs Lab 06/04/16 1309 06/05/16 0139 06/06/16 0539  AST 68* 53* 72*  ALT 29 24 26     ALKPHOS 156* 126 132*  BILITOT 0.2* 0.5 1.0  PROT 6.9 5.9* 5.8*  ALBUMIN 3.0* 2.7* 2.6*    Recent Labs Lab 06/04/16 1309  LIPASE 32   No results for input(s): AMMONIA in the last 168 hours. Coagulation Profile: No results for input(s): INR, PROTIME in the last 168 hours. Cardiac Enzymes: No results for input(s): CKTOTAL, CKMB, CKMBINDEX, TROPONINI in the last 168 hours. BNP (last 3 results) No results for input(s): PROBNP in the last 8760 hours. HbA1C: No results for input(s): HGBA1C in the last 72 hours. CBG: No results for input(s): GLUCAP in the last 168 hours. Lipid Profile: No results for input(s): CHOL, HDL, LDLCALC, TRIG, CHOLHDL, LDLDIRECT in the last 72 hours. Thyroid Function Tests: No results for input(s): TSH, T4TOTAL, FREET4, T3FREE, THYROIDAB in the last 72 hours. Anemia Panel: No results for input(s): VITAMINB12, FOLATE, FERRITIN, TIBC, IRON, RETICCTPCT in the last 72 hours. Urine analysis:    Component Value Date/Time   COLORURINE YELLOW 06/04/2016 1940   APPEARANCEUR CLEAR 06/04/2016 1940   LABSPEC 1.031 (H) 06/04/2016 1940   PHURINE 7.5 06/04/2016 1940   GLUCOSEU NEGATIVE 06/04/2016 1940   HGBUR NEGATIVE 06/04/2016 1940   BILIRUBINUR NEGATIVE 06/04/2016 1940   KETONESUR NEGATIVE 06/04/2016 1940   PROTEINUR NEGATIVE 06/04/2016 1940   NITRITE NEGATIVE 06/04/2016 1940   LEUKOCYTESUR MODERATE (A) 06/04/2016 1940   Sepsis Labs: @LABRCNTIP (procalcitonin:4,lacticidven:4)  ) Recent Results (from the past 240 hour(s))  Urine culture     Status: Abnormal   Collection Time: 06/04/16  7:40 PM  Result Value Ref Range Status   Specimen Description URINE, RANDOM  Final   Special Requests NONE  Final   Culture (A)  Final    >=100,000 COLONIES/mL STAPHYLOCOCCUS SPECIES (COAGULASE NEGATIVE)   Report Status 06/08/2016 FINAL  Final   Organism ID, Bacteria STAPHYLOCOCCUS SPECIES (COAGULASE NEGATIVE) (A)  Final      Susceptibility   Staphylococcus species  (coagulase negative) - MIC*    CIPROFLOXACIN >=8 RESISTANT Resistant     GENTAMICIN <=0.5 SENSITIVE Sensitive     NITROFURANTOIN <=16 SENSITIVE Sensitive     OXACILLIN <=0.25 SENSITIVE Sensitive     TETRACYCLINE >=16 RESISTANT Resistant     VANCOMYCIN 1 SENSITIVE Sensitive     TRIMETH/SULFA >=320 RESISTANT Resistant     CLINDAMYCIN <=0.25 RESISTANT Resistant     RIFAMPIN <=0.5 SENSITIVE Sensitive     Inducible Clindamycin POSITIVE Resistant     * >=100,000 COLONIES/mL STAPHYLOCOCCUS SPECIES (COAGULASE NEGATIVE)  Radiology Studies: No results found.      Scheduled Meds: . amLODipine  10 mg Oral Daily  . amoxicillin-clavulanate  1 tablet Oral Q12H  . enoxaparin (LOVENOX) injection  40 mg Subcutaneous QHS  . feeding supplement  1 Container Oral TID BM  . folic acid  1 mg Oral Daily  . LORazepam  0-4 mg Intravenous Q4H  . multivitamin with minerals  1 tablet Oral Daily  . nicotine  21 mg Transdermal Daily  . sodium chloride flush  3 mL Intravenous Q12H  . thiamine  100 mg Oral Daily   Or  . thiamine  100 mg Intravenous Daily   Continuous Infusions: . 0.45 % NaCl with KCl 20 mEq / L 50 mL/hr at 06/07/16 1409     LOS: 4 days    Time spent:    Zannie Cove, MD [facility] Pager [phone]  If 7PM-7AM, please contact night-coverage [email or web address] Password [facility] 06/08/2016, 11:17 AM

## 2016-06-09 DIAGNOSIS — K5780 Diverticulitis of intestine, part unspecified, with perforation and abscess without bleeding: Secondary | ICD-10-CM

## 2016-06-09 DIAGNOSIS — F10239 Alcohol dependence with withdrawal, unspecified: Secondary | ICD-10-CM

## 2016-06-09 MED ORDER — UNABLE TO FIND: 0 refills | Status: DC

## 2016-06-09 MED ORDER — BUSPIRONE HCL 5 MG PO TABS: 5.0000 mg | ORAL_TABLET | Freq: Two times a day (BID) | ORAL | 0 refills | Status: DC

## 2016-06-09 MED ORDER — BUSPIRONE HCL 5 MG PO TABS
5.0000 mg | ORAL_TABLET | Freq: Two times a day (BID) | ORAL | Status: DC
  Administered 2016-06-09: 5 mg via ORAL
  Filled 2016-06-09: qty 1

## 2016-06-09 MED ORDER — AMOXICILLIN-POT CLAVULANATE 875-125 MG PO TABS: 1.0000 | ORAL_TABLET | Freq: Two times a day (BID) | ORAL | 0 refills | Status: DC

## 2016-06-09 NOTE — Clinical Social Work Note (Signed)
CSW order in place to provide patient with ETOH resources.  CSW met with patient who indicated that he was given the resource list yesterday by another SW who met with him. States that he understands resources and plans to follow up on out patient services in the near future. Patient indicates that he has to "take one thing at a time" in that he has diverticulitus and that he needs to get this condition stable first.  He states that he was "self-medicating for pain of diverticulitus as he did not have any pain medication. Once the pain became too great- he started drinking heavily.  Patient states that he feels the alcohol further exacerbated his condition.  He states he does not want to continue to drink and feels resources will be helpful.  Patient expresses that he does not have any supportive family in this area and limited friend support.  He states he hopes to go home tomorrow; discussed briefly with Dr. Broadus John who indicated that he is not medically stable for d/c at this time. Lorie Phenix. Pauline Good, [place]  (weekend coverage)

## 2016-06-09 NOTE — Progress Notes (Signed)
Pt given discharge instructions, prescriptions, and care notes. Pt verbalized understanding AEB no further questions or concerns at this time. IV was discontinued, no redness, pain, or swelling noted at this time. Telemetry discontinued and Centralized Telemetry was notified. Pt left the floor via wheelchair with staff in stable condition. 

## 2016-06-09 NOTE — Discharge Summary (Signed)
Physician Discharge Summary  Alvin Benton ZOX:[id number] DOB: 11-12-1950 DOA: 06/04/2016  PCP: Neena Rhymes, MD  Admit date: 06/04/2016 Discharge date: 06/09/2016  Time spent: 45 minutes  Recommendations for Outpatient Follow-up:  1. [facility] Dr.Wilson or associates in 1-2 weeks 2. 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 Was here in July and didn't appear to have infection but was treated for DT's. Now presents with abd pain LLQ for several days , and also some RUQ pain. CT abd in ED shows "chronic micro perforation due to sigmoid colon diverticulitis", no drainable abscess and prob colovesical fistula. He has been unable to eat much the last 4-5 days.  Hospital Course:  1 Recurrent diverticulitis / abd pain/ anorexia/ colovesical fistula -improving, advanced diet, changed IV Zosyn to Augmentin -pt not reliable with etoh history to f/u in outpatient setting, hence [facility] consulted, recommended colectomy, colostomy, bladder repair -h/o colonoscopy in 2015 with mod diverticulosis -Seen by Dr.Wilson in consultation, pt declined surgery at this time, will need close FU with [facility] -ambulated, PT eval completed -stable for discharge on oral Abx at this time  2 Etoh abuse with active withdrawal/ tremors - -treated  with IV ativan per  CIWA protocol -IVF, thiamine used -now improved, resources for ETOH cessation given by CSW -counseled -started on low dose buspar for anxiety, he has a psychiatrist that he will FU with.  3 HTN - cont norvasc , stopped ARB due to soft BPs  4  Dehydration -due to chronic alcoholism and above GI issues -resolved with hydration  5 Hypokalemia -replaced  6 Asymptomatic bacteruria -does not need Abx for this  Consultations:  CCS  Discharge Exam: Vitals:   06/08/16 2042 06/09/16 0549  BP: (!) 153/84 137/74  Pulse: 65 81  Resp: 18 18  Temp: 99.4 F (37.4 C) 98 F (36.7 C)    General: AAOx3 Cardiovascular:s1s2/RRR Respiratory: CTAB  Discharge Instructions   Discharge Instructions    Diet - low sodium heart healthy    Complete by:  As directed   Increase activity slowly    Complete by:  As directed     Current Discharge Medication List    START taking these medications   Details  amoxicillin-clavulanate (AUGMENTIN) 875-125 MG tablet Take 1 tablet by mouth every 12 (twelve) hours. For 5days Qty: 10 tablet, Refills: 0    busPIRone (BUSPAR) 5 MG tablet Take 1 tablet (5 mg total) by mouth 2 (two) times daily. Qty: 60 tablet, Refills: 0      CONTINUE these medications which have NOT CHANGED   Details  albuterol (PROVENTIL HFA;VENTOLIN HFA) 108 (90 BASE) MCG/ACT inhaler Inhale 2 puffs into the lungs every 6 (six) hours as needed for wheezing or shortness of breath. Qty: 1 Inhaler, Refills: 2    aspirin  EC 325 MG tablet Take 325 mg by mouth daily.    famotidine (PEPCID) 20 MG tablet Take 1 tablet (20 mg total) by mouth 2 (two) times daily.    folic acid (FOLVITE) 1 MG tablet Take 1 tablet (1 mg total) by mouth daily. Qty: 30 tablet, Refills: 3    Multiple Vitamin (MULTIVITAMIN WITH MINERALS) TABS tablet Take 1 tablet by mouth daily.    nicotine (NICODERM CQ - DOSED IN MG/24 HOURS) 21 mg/24hr patch Place 1 patch (21 mg total) onto the skin  daily. Qty: 28 patch, Refills: 0    Simethicone (GAS-X PO) Take 1 tablet by mouth daily as needed (gas).    thiamine (VITAMIN B-1) 100 MG tablet Take 100 mg by mouth daily.    amLODipine (NORVASC) 10 MG tablet Take 1 tablet (10 mg total) by mouth daily. Qty: 30 tablet, Refills: 2      STOP taking these medications     ciprofloxacin (CIPRO) 500 MG tablet      LORazepam (ATIVAN) 1 MG tablet      metroNIDAZOLE (FLAGYL) 500 MG tablet      valsartan (DIOVAN) 160 MG tablet        No Known Allergies Follow-up Information    Atilano Ina, MD Follow up in 2 week(s).   Specialty:  General Surgery Why:  for evaluation for surgery Contact information: [address] [place] [address] [phone]        psychiatry. Schedule an appointment as soon as possible for a visit in 2 week(s).            The results of significant diagnostics from this hospitalization (including imaging, microbiology, ancillary and laboratory) are listed below for reference.    Significant Diagnostic Studies: Ct Abdomen Pelvis W Contrast  Result Date: 06/04/2016 CLINICAL DATA:  Intermittent upper abdominal pain since February. EXAM: CT ABDOMEN AND PELVIS WITH CONTRAST TECHNIQUE: Multidetector CT imaging of the abdomen and pelvis was performed using the standard protocol following bolus administration of intravenous contrast. CONTRAST:  ISOVUE-300 IOPAMIDOL (ISOVUE-300) INJECTION 61% COMPARISON:  04/04/2016 FINDINGS: Lower chest:  Unremarkable. Hepatobiliary: The liver shows diffusely decreased attenuation suggesting steatosis. Mild gallbladder wall thickening or pericholecystic fluid is evident. No definite stones in the gallbladder. No intrahepatic or extrahepatic biliary dilation. Pancreas: No focal mass lesion. No dilatation of the main duct. No intraparenchymal cyst. No peripancreatic edema. Spleen: No splenomegaly. No focal mass lesion. Adrenals/Urinary Tract: No adrenal nodule or  mass. Kidneys are unremarkable. No evidence for hydroureter. Gas is identified in the urinary bladder. Left bladder wall thickening is evident. Stomach/Bowel: Stomach is nondistended. No gastric wall thickening. No evidence of outlet obstruction. Duodenum is normally positioned as is the ligament of Treitz. No small bowel wall thickening. No small bowel dilatation. The terminal ileum is normal. The appendix is not visualized, but there is no edema or inflammation in the region of the cecum. As seen on previous imaging, there are diverticular changes in the left colon. Proximal sigmoid segment shows ill-defined wall thickening with pericolonic edema/ inflammation. Several small loculated of gas are seen in the sigmoid mesocolon consistent with micro perforation. Small bowel loops are tethered into the left lower quadrant inflammatory process. Coronal imaging shows bladder wall thickening contiguous with the abnormal colon with gas loculated between the colon in the bladder wall. Imaging features are highly suspicious for colovesical fistula. Vascular/Lymphatic: There is abdominal aortic atherosclerosis without aneurysm. There is no gastrohepatic or hepatoduodenal ligament lymphadenopathy. No intraperitoneal  or retroperitoneal lymphadenopathy. Small pelvic sidewall lymph nodes are stable. Reproductive: Prostate gland is enlarged. Other: No substantial intraperitoneal free fluid. Musculoskeletal: Bone windows reveal no worrisome lytic or sclerotic osseous lesions. IMPRESSION: 1. Persistent inflammatory changes in the left lower quadrant with extraluminal gas in the sigmoid mesocolon as before, suggesting chronic micro perforation due to sigmoid colon diverticulitis. No drainable abscess at this time. As neoplasm can present with similar imaging features, correlation with colorectal cancer screening history recommended. 2. Inflammatory changes in the sigmoid colon are associated with gas in the wall of the bladder and  asymmetric bladder wall thickening along the region in contact with the inflamed colon. Gas in the bladder lumen is noted. Imaging features are compatible with colovesical fistula. 3. Abdominal aortic atherosclerosis. 4. Mild pericholecystic fluid or gallbladder wall thickening. This may be a secondary finding. Ultrasound could be used to further evaluate as clinically warranted. No biliary dilatation. Electronically Signed   By: Kennith CenterEric  Mansell M.D.   On: 06/04/2016 17:33   [place] Abdomen Limited  Result Date: 06/04/2016 CLINICAL DATA:  Elevated LFTs. EXAM: US ABDOMEN LIMITED - RIGHT UPPER QUADRANT COMPARISON:  CT scan from earlier today. FINDINGS: Gallbladder: Gallbladder is distended without gallbladder wall thickening or pericholecystic fluid by ultrasound. No evidence for gallstones. Sludge is visualized in the lumen. The sonographer reports no sonographic Murphy sign. Common bile duct: Diameter: 5 Liver: Coarsened echotexture with poor acoustic through transmission suggests fatty deposition. IMPRESSION: No acute findings. Electronically Signed   By: Kennith CenterEric  Mansell M.D.   On: 06/04/2016 20:16    Microbiology: Recent Results (from the past 240 hour(s))  Urine culture     Status: Abnormal   Collection Time: 06/04/16  7:40 PM  Result Value Ref Range Status   Specimen Description URINE, RANDOM  Final   Special Requests NONE  Final   Culture (A)  Final    >=100,000 COLONIES/mL STAPHYLOCOCCUS SPECIES (COAGULASE NEGATIVE)   Report Status 06/08/2016 FINAL  Final   Organism ID, Bacteria STAPHYLOCOCCUS SPECIES (COAGULASE NEGATIVE) (A)  Final      Susceptibility   Staphylococcus species (coagulase negative) - MIC*    CIPROFLOXACIN >=8 RESISTANT Resistant     GENTAMICIN <=0.5 SENSITIVE Sensitive     NITROFURANTOIN <=16 SENSITIVE Sensitive     OXACILLIN <=0.25 SENSITIVE Sensitive     TETRACYCLINE >=16 RESISTANT Resistant     VANCOMYCIN 1 SENSITIVE Sensitive     TRIMETH/SULFA >=320 RESISTANT Resistant      CLINDAMYCIN <=0.25 RESISTANT Resistant     RIFAMPIN <=0.5 SENSITIVE Sensitive     Inducible Clindamycin POSITIVE Resistant     * >=100,000 COLONIES/mL STAPHYLOCOCCUS SPECIES (COAGULASE NEGATIVE)     Labs: Basic Metabolic Panel:  Recent Labs Lab 06/04/16 1309 06/05/16 0138 06/05/16 0139 06/06/16 0539 06/07/16 0405 06/08/16 0730  NA 137  --  139 134* 138 135  K 3.3*  --  2.8* 3.3* 3.4* 3.7  CL 100*  --  105 99* 104 106  CO2 25  --  22 26 23  20*  GLUCOSE 101*  --  106* 101* 84 96  BUN <5*  --  <5* <5* <5* <5*  CREATININE 0.49*  --  0.51* 0.58* 0.56* 0.51*  CALCIUM 8.7*  --  7.8* 8.2* 8.5* 8.4*  MG  --  1.1*  --   --   --   --    Liver Function Tests:  Recent Labs Lab 06/04/16 1309 06/05/16 0139 06/06/16 0539  AST 68* 53* 72*  ALT 29  24 26  ALKPHOS 156* 126 132*  BILITOT 0.2* 0.5 1.0  PROT 6.9 5.9* 5.8*  ALBUMIN 3.0* 2.7* 2.6*    Recent Labs Lab 06/04/16 1309  LIPASE 32   No results for input(s): AMMONIA in the last 168 hours. CBC:  Recent Labs Lab 06/04/16 1309 06/05/16 0139 06/06/16 0539 06/07/16 0405 06/08/16 0730  WBC 5.2 7.9 6.6 7.2 14.2*  HGB 15.7 13.4 13.1 13.7 14.0  HCT 44.7 39.8 40.0 41.5 41.8  MCV 93.5 95.2 95.2 97.6 95.9  PLT 268 219 189 183 220   Cardiac Enzymes: No results for input(s): CKTOTAL, CKMB, CKMBINDEX, TROPONINI in the last 168 hours. BNP: BNP (last 3 results) No results for input(s): BNP in the last 8760 hours.  ProBNP (last 3 results) No results for input(s): PROBNP in the last 8760 hours.  CBG: No results for input(s): GLUCAP in the last 168 hours.     SignedZannie Cove MD.  [facility] 06/09/2016, 12:51 PM

## 2016-06-16 ENCOUNTER — Other Ambulatory Visit: Payer: Self-pay | Admitting: Family Medicine

## 2016-06-17 ENCOUNTER — Telehealth: Payer: Self-pay | Admitting: Emergency Medicine

## 2016-06-17 NOTE — Telephone Encounter (Signed)
Patient called requesting a refill of his Lorazepam 1 mg. He states he has a bottle with Dr. Beverely Lowabori name on it from last refill. In researching patient chart there was 2 letters sent out for dismissal but returned. Please advise how to handle situation.

## 2016-06-17 NOTE — Telephone Encounter (Signed)
Dr. Beverely Lowtabori has declined this medication today (sent electronically to pharmacy)  due to pt having been dismissed from practice.

## 2016-07-06 ENCOUNTER — Other Ambulatory Visit: Payer: Self-pay | Admitting: Family Medicine

## 2016-07-08 ENCOUNTER — Other Ambulatory Visit: Payer: Self-pay | Admitting: General Surgery

## 2016-07-08 NOTE — Telephone Encounter (Signed)
Have advised [facility] about the situation that patient had not received his dismissal letter from the practice.

## 2016-08-09 ENCOUNTER — Other Ambulatory Visit: Payer: Self-pay | Admitting: Urology

## 2016-08-27 ENCOUNTER — Encounter (HOSPITAL_COMMUNITY)
Admission: RE | Admit: 2016-08-27 | Discharge: 2016-08-27 | Disposition: A | Discharge status: Home / Self Care | Payer: Managed Care, Other (non HMO) | Source: Ambulatory Visit | Attending: General Surgery | Admitting: General Surgery

## 2016-08-27 ENCOUNTER — Encounter (INDEPENDENT_AMBULATORY_CARE_PROVIDER_SITE_OTHER): Payer: Self-pay

## 2016-08-27 ENCOUNTER — Encounter (HOSPITAL_COMMUNITY): Payer: Self-pay

## 2016-08-27 DIAGNOSIS — F17210 Nicotine dependence, cigarettes, uncomplicated: Secondary | ICD-10-CM | POA: Diagnosis not present

## 2016-08-27 DIAGNOSIS — E876 Hypokalemia: Secondary | ICD-10-CM | POA: Diagnosis present

## 2016-08-27 DIAGNOSIS — Z79899 Other long term (current) drug therapy: Secondary | ICD-10-CM | POA: Diagnosis not present

## 2016-08-27 DIAGNOSIS — Z7982 Long term (current) use of aspirin: Secondary | ICD-10-CM | POA: Diagnosis not present

## 2016-08-27 DIAGNOSIS — Z7951 Long term (current) use of inhaled steroids: Secondary | ICD-10-CM | POA: Diagnosis not present

## 2016-08-27 DIAGNOSIS — I10 Essential (primary) hypertension: Secondary | ICD-10-CM | POA: Diagnosis not present

## 2016-08-27 HISTORY — DX: Cardiac murmur, unspecified: R01.1

## 2016-08-27 HISTORY — DX: Personal history of urinary (tract) infections: Z87.440

## 2016-08-27 HISTORY — DX: Other seasonal allergic rhinitis: J30.2

## 2016-08-27 HISTORY — DX: Pneumonia, unspecified organism: J18.9

## 2016-08-27 HISTORY — DX: Alcohol abuse, uncomplicated: F10.10

## 2016-08-27 HISTORY — DX: Tobacco use: Z72.0

## 2016-08-27 HISTORY — DX: Gastro-esophageal reflux disease without esophagitis: K21.9

## 2016-08-27 LAB — CBC
HCT: 41.5 % (ref 39.0–52.0)
Hemoglobin: 14 g/dL (ref 13.0–17.0)
MCH: 33 pg (ref 26.0–34.0)
MCHC: 33.7 g/dL (ref 30.0–36.0)
MCV: 97.9 fL (ref 78.0–100.0)
Platelets: 429 10*3/uL — ABNORMAL HIGH (ref 150–400)
RBC: 4.24 MIL/uL (ref 4.22–5.81)
RDW: 14 % (ref 11.5–15.5)
WBC: 7.2 10*3/uL (ref 4.0–10.5)

## 2016-08-27 LAB — COMPREHENSIVE METABOLIC PANEL
ALK PHOS: 49 U/L (ref 38–126)
ALT: 16 U/L — ABNORMAL LOW (ref 17–63)
ANION GAP: 11 (ref 5–15)
AST: 28 U/L (ref 15–41)
Albumin: 3.2 g/dL — ABNORMAL LOW (ref 3.5–5.0)
BILIRUBIN TOTAL: 0.7 mg/dL (ref 0.3–1.2)
BUN: 5 mg/dL — ABNORMAL LOW (ref 6–20)
CALCIUM: 8.6 mg/dL — AB (ref 8.9–10.3)
CO2: 36 mmol/L — AB (ref 22–32)
Chloride: 91 mmol/L — ABNORMAL LOW (ref 101–111)
Creatinine, Ser: 0.61 mg/dL (ref 0.61–1.24)
GFR calc non Af Amer: >60 mL/min (ref >60)
Glucose, Bld: 91 mg/dL (ref 65–99)
POTASSIUM: 2.1 mmol/L — AB (ref 3.5–5.1)
SODIUM: 138 mmol/L (ref 135–145)
TOTAL PROTEIN: 6.8 g/dL (ref 6.5–8.1)

## 2016-08-27 LAB — ABO/RH: ABO/RH(D): A POS

## 2016-08-27 NOTE — Progress Notes (Signed)
Spoke with Dr Council Mechanicenenny / anesthesia in regards to pts H&P and EKGs per today 08/27/2016 and 03/2016 both per epic. No orders given. Anesthesia to see pt day of surgery.

## 2016-08-27 NOTE — Consult Note (Signed)
WOC Nurse requested for preoperative stoma site marking  Discussed surgical procedure and stoma creation with patient and family.  Explained role of the WOC nurse team.  Provided the patient with educational booklet/DVD and provided samples of pouching options.  Answered patient and family questions.   Examined patient lying, sitting, and standing in order to place the marking in the patient's visual field, away from any creases or abdominal contour issues and within the rectus muscle.  Attempted to mark below the patient's belt line.   Marked for colostomy in the LLQ  2 cm to the left of the umbilicus and 2 cm above/below the umbilicus.  Marked for ileostomy in the RLQ  2 cm to the right of the umbilicus and 2  cm above/below the umbilicus.    Patient's abdomen cleansed with CHG wipes at site markings, allowed to air dry prior to marking.Covered mark with thin film transparent dressing to preserve mark until date of surgery.   WOC Nurse team will follow up with patient after surgery for continue ostomy care and teaching.   Maple HudsonKaren Ksenia Kunz RN BSN CWON Pager [phone]

## 2016-08-27 NOTE — Patient Instructions (Signed)
Alvin Benton  08/27/2016   Your procedure is scheduled on: Thursday August 29, 2016  Report to [facility]  Entrance take [place]  elevators to 3rd floor to  Short Stay Center at 5:30 AM.  Call this number if you have problems the morning of surgery [phone]   Remember: ONLY 1 PERSON MAY GO WITH YOU TO SHORT STAY TO GET  READY MORNING OF YOUR SURGERY.  Do not eat food or drink liquids :After Midnight.     Take these medicines the morning of surgery with A SIP OF WATER: Famotidine (Pepcid) if needed; May use albuterol inhaler if needed                                 You may not have any metal on your body including hair pins and              piercings  Do not wear jewelry, lotions, powders or colognes, deodorant                 Men may shave face and neck.   Do not bring valuables to the hospital. [facility] IS NOT             RESPONSIBLE   FOR VALUABLES.  Contacts, dentures or bridgework may not be worn into surgery.  Leave suitcase in the car. After surgery it may be brought to your room.    Special Instructions: FOLLOW SURGEON'S INSTRUCTION IN REGARDS TO BOWEL PREPARATION PRIOR TO SURGICAL PROCEDURE DATE NO ALCOHOL COMSUMPTION OR SMOKING 24 HOURS PRIOR TO SURGICAL PROCEDURE DATE         _____________________________________________________________________             [facility] - Pre[facility] Before surgery, you can play an important role.  Because skin is not sterile, your skin needs to be as free of germs as possible.  You can reduce the number of germs on your skin by washing with CHG (chlorahexidine gluconate) soap before surgery.  CHG is an antiseptic cleaner which kills germs and bonds with the skin to continue killing germs even after washing. Please DO NOT use if you have an allergy to CHG or antibacterial soaps.  If your skin becomes reddened/irritated stop using the CHG and inform your nurse when you arrive at Short Stay. Do  not shave (including legs and underarms) for at least 48 hours prior to the first CHG shower.  You may shave your face/neck. Please follow these instructions carefully:  1.  Shower with CHG Soap the night before surgery and the  morning of Surgery.  2.  If you choose to wash your hair, wash your hair first as usual with your  normal  shampoo.  3.  After you shampoo, rinse your hair and body thoroughly to remove the  shampoo.                           4.  Use CHG as you would any other liquid soap.  You can apply chg directly  to the skin and wash                       Gently with a scrungie or clean washcloth.  5.  Apply the CHG Soap to your  body ONLY FROM THE NECK DOWN.   Do not use on face/ open                           Wound or open sores. Avoid contact with eyes, ears mouth and genitals (private parts).                       Wash face,  Genitals (private parts) with your normal soap.             6.  Wash thoroughly, paying special attention to the area where your surgery  will be performed.  7.  Thoroughly rinse your body with warm water from the neck down.  8.  DO NOT shower/wash with your normal soap after using and rinsing off  the CHG Soap.                9.  Pat yourself dry with a clean towel.            10.  Wear clean pajamas.            11.  Place clean sheets on your bed the night of your first shower and do not  sleep with pets. Day of Surgery : Do not apply any lotions/deodorants the morning of surgery.  Please wear clean clothes to the hospital/surgery center.  FAILURE TO FOLLOW THESE INSTRUCTIONS MAY RESULT IN THE CANCELLATION OF YOUR SURGERY PATIENT SIGNATURE_________________________________  NURSE SIGNATURE__________________________________  ________________________________________________________________________

## 2016-08-27 NOTE — Progress Notes (Signed)
CRITICAL VALUE ALERT  Critical value received:  Potassium  Date of notification:  08/27/2016  Time of notification:  425 PM  Critical value read back: yes  Nurse who received alert:  Telford NabBeverly M. Georgeanna LeaNanney, RN, BSN  MD notified (1st page):  Called [facility] office and reported to Triage person Amy that I needed to speak to Dr. Maisie Fushomas about a critical value. Amy states she is off this afternoon and I asked her to speak to a Physician there.  Time of first page:  428 pm  MD notified (2nd page):no  Time of second page:no  Responding MD:  Dr. Gerrit FriendsGerkin  Time MD responded:  446 pm on phone at [facility] talked to Dr. Darnell Levelodd Gerkin on phone and he stated he would get in touch with Dr. Maisie Fushomas and let her know.

## 2016-08-28 ENCOUNTER — Inpatient Hospital Stay (HOSPITAL_COMMUNITY): Payer: Managed Care, Other (non HMO) | Admitting: Certified Registered Nurse Anesthetist

## 2016-08-28 LAB — HEMOGLOBIN A1C
Hgb A1c MFr Bld: 4.4 % — ABNORMAL LOW (ref 4.8–5.6)
MEAN PLASMA GLUCOSE: 80 mg/dL

## 2016-08-29 ENCOUNTER — Encounter (HOSPITAL_COMMUNITY)
Admission: RE | Disposition: A | Discharge status: Home / Self Care | Payer: Self-pay | Source: Ambulatory Visit | Attending: General Surgery

## 2016-08-29 ENCOUNTER — Encounter (HOSPITAL_COMMUNITY): Payer: Self-pay | Admitting: Emergency Medicine

## 2016-08-29 ENCOUNTER — Ambulatory Visit (HOSPITAL_COMMUNITY)
Admission: RE | Admit: 2016-08-29 | Discharge: 2016-08-29 | Disposition: A | Discharge status: Home / Self Care | Payer: Managed Care, Other (non HMO) | Source: Ambulatory Visit | Attending: General Surgery | Admitting: General Surgery

## 2016-08-29 ENCOUNTER — Emergency Department (HOSPITAL_COMMUNITY)
Admission: EM | Admit: 2016-08-29 | Discharge: 2016-08-29 | Disposition: A | Discharge status: Home / Self Care | Payer: Managed Care, Other (non HMO) | Attending: Emergency Medicine | Admitting: Emergency Medicine

## 2016-08-29 ENCOUNTER — Encounter (HOSPITAL_COMMUNITY): Payer: Self-pay

## 2016-08-29 DIAGNOSIS — I4581 Long QT syndrome: Secondary | ICD-10-CM

## 2016-08-29 DIAGNOSIS — I10 Essential (primary) hypertension: Secondary | ICD-10-CM

## 2016-08-29 DIAGNOSIS — I517 Cardiomegaly: Secondary | ICD-10-CM

## 2016-08-29 DIAGNOSIS — Z01810 Encounter for preprocedural cardiovascular examination: Secondary | ICD-10-CM

## 2016-08-29 DIAGNOSIS — E876 Hypokalemia: Secondary | ICD-10-CM | POA: Insufficient documentation

## 2016-08-29 DIAGNOSIS — R9431 Abnormal electrocardiogram [ECG] [EKG]: Secondary | ICD-10-CM

## 2016-08-29 DIAGNOSIS — K632 Fistula of intestine: Secondary | ICD-10-CM

## 2016-08-29 DIAGNOSIS — Z7951 Long term (current) use of inhaled steroids: Secondary | ICD-10-CM | POA: Insufficient documentation

## 2016-08-29 DIAGNOSIS — Z79899 Other long term (current) drug therapy: Secondary | ICD-10-CM | POA: Insufficient documentation

## 2016-08-29 DIAGNOSIS — Z01812 Encounter for preprocedural laboratory examination: Secondary | ICD-10-CM

## 2016-08-29 DIAGNOSIS — F17210 Nicotine dependence, cigarettes, uncomplicated: Secondary | ICD-10-CM | POA: Insufficient documentation

## 2016-08-29 DIAGNOSIS — Z7982 Long term (current) use of aspirin: Secondary | ICD-10-CM | POA: Insufficient documentation

## 2016-08-29 LAB — COMPREHENSIVE METABOLIC PANEL
ALBUMIN: 2.9 g/dL — AB (ref 3.5–5.0)
ALT: 13 U/L — AB (ref 17–63)
AST: 30 U/L (ref 15–41)
Alkaline Phosphatase: 47 U/L (ref 38–126)
Anion gap: 8 (ref 5–15)
CHLORIDE: 95 mmol/L — AB (ref 101–111)
CO2: 36 mmol/L — AB (ref 22–32)
CREATININE: 0.76 mg/dL (ref 0.61–1.24)
Calcium: 8.1 mg/dL — ABNORMAL LOW (ref 8.9–10.3)
GFR calc Af Amer: >60 mL/min (ref >60)
GFR calc non Af Amer: >60 mL/min (ref >60)
Glucose, Bld: 97 mg/dL (ref 65–99)
POTASSIUM: 2 mmol/L — AB (ref 3.5–5.1)
SODIUM: 139 mmol/L (ref 135–145)
Total Bilirubin: 0.8 mg/dL (ref 0.3–1.2)
Total Protein: 6.5 g/dL (ref 6.5–8.1)

## 2016-08-29 LAB — URINALYSIS, ROUTINE W REFLEX MICROSCOPIC
Glucose, UA: NEGATIVE mg/dL
Hgb urine dipstick: NEGATIVE
Ketones, ur: 15 mg/dL — AB
NITRITE: NEGATIVE
PH: 6.5 (ref 5.0–8.0)
Protein, ur: NEGATIVE mg/dL
SPECIFIC GRAVITY, URINE: 1.018 (ref 1.005–1.030)

## 2016-08-29 LAB — TYPE AND SCREEN
ABO/RH(D): A POS
ANTIBODY SCREEN: NEGATIVE

## 2016-08-29 LAB — URINE MICROSCOPIC-ADD ON

## 2016-08-29 LAB — MAGNESIUM: Magnesium: 1.7 mg/dL (ref 1.7–2.4)

## 2016-08-29 SURGERY — COLECTOMY, PARTIAL, ROBOT-ASSISTED, LAPAROSCOPIC
Anesthesia: General

## 2016-08-29 MED ORDER — POTASSIUM CHLORIDE 10 MEQ/100ML IV SOLN
10.0000 meq | Freq: Once | INTRAVENOUS | Status: AC
  Administered 2016-08-29: 10 meq via INTRAVENOUS
  Filled 2016-08-29: qty 100

## 2016-08-29 MED ORDER — PROPOFOL 10 MG/ML IV BOLUS
INTRAVENOUS | Status: AC
  Filled 2016-08-29: qty 40

## 2016-08-29 MED ORDER — ROCURONIUM BROMIDE 50 MG/5ML IV SOSY
PREFILLED_SYRINGE | INTRAVENOUS | Status: AC
  Filled 2016-08-29: qty 10

## 2016-08-29 MED ORDER — POTASSIUM CHLORIDE CRYS ER 20 MEQ PO TBCR: 40.0000 meq | EXTENDED_RELEASE_TABLET | Freq: Two times a day (BID) | ORAL | 0 refills | Status: DC

## 2016-08-29 MED ORDER — CEFOTETAN DISODIUM-DEXTROSE 2-2.08 GM-% IV SOLR
INTRAVENOUS | Status: AC
  Filled 2016-08-29: qty 50

## 2016-08-29 MED ORDER — SUGAMMADEX SODIUM 200 MG/2ML IV SOLN
INTRAVENOUS | Status: AC
  Filled 2016-08-29: qty 2

## 2016-08-29 MED ORDER — ALVIMOPAN 12 MG PO CAPS
12.0000 mg | ORAL_CAPSULE | Freq: Once | ORAL | Status: AC
  Administered 2016-08-29: 12 mg via ORAL
  Filled 2016-08-29: qty 1

## 2016-08-29 MED ORDER — CELECOXIB 200 MG PO CAPS
400.0000 mg | ORAL_CAPSULE | ORAL | Status: AC
  Administered 2016-08-29: 400 mg via ORAL
  Filled 2016-08-29: qty 2

## 2016-08-29 MED ORDER — MAGNESIUM SULFATE 2 GM/50ML IV SOLN
2.0000 g | Freq: Once | INTRAVENOUS | Status: AC
  Administered 2016-08-29: 2 g via INTRAVENOUS
  Filled 2016-08-29: qty 50

## 2016-08-29 MED ORDER — MIDAZOLAM HCL 2 MG/2ML IJ SOLN
INTRAMUSCULAR | Status: AC
  Filled 2016-08-29: qty 2

## 2016-08-29 MED ORDER — DEXTROSE 5 % IV SOLN: 2.0000 g | INTRAVENOUS | Status: DC

## 2016-08-29 MED ORDER — ONDANSETRON HCL 4 MG/2ML IJ SOLN
INTRAMUSCULAR | Status: AC
  Filled 2016-08-29: qty 2

## 2016-08-29 MED ORDER — GABAPENTIN 300 MG PO CAPS
300.0000 mg | ORAL_CAPSULE | ORAL | Status: AC
  Administered 2016-08-29: 300 mg via ORAL
  Filled 2016-08-29: qty 1

## 2016-08-29 MED ORDER — LIDOCAINE 2% (20 MG/ML) 5 ML SYRINGE
INTRAMUSCULAR | Status: AC
  Filled 2016-08-29: qty 5

## 2016-08-29 MED ORDER — ACETAMINOPHEN 500 MG PO TABS
1000.0000 mg | ORAL_TABLET | ORAL | Status: AC
  Administered 2016-08-29: 1000 mg via ORAL
  Filled 2016-08-29: qty 2

## 2016-08-29 MED ORDER — LACTATED RINGERS IV SOLN
INTRAVENOUS | Status: AC | PRN
  Administered 2016-08-29: 07:00:00 via INTRAVENOUS

## 2016-08-29 MED ORDER — HEPARIN SODIUM (PORCINE) 5000 UNIT/ML IJ SOLN
5000.0000 [IU] | Freq: Once | INTRAMUSCULAR | Status: AC
  Administered 2016-08-29: 5000 [IU] via SUBCUTANEOUS
  Filled 2016-08-29: qty 1

## 2016-08-29 MED ORDER — FENTANYL CITRATE (PF) 250 MCG/5ML IJ SOLN
INTRAMUSCULAR | Status: AC
  Filled 2016-08-29: qty 5

## 2016-08-29 MED ORDER — POTASSIUM CHLORIDE CRYS ER 20 MEQ PO TBCR
40.0000 meq | EXTENDED_RELEASE_TABLET | Freq: Once | ORAL | Status: AC
  Administered 2016-08-29: 40 meq via ORAL
  Filled 2016-08-29: qty 2

## 2016-08-29 NOTE — ED Notes (Signed)
Patient states he hasn't taken BP medication today to pending sx

## 2016-08-29 NOTE — Progress Notes (Signed)
Critical potassium K+2.0 called at 0646 08/29/16. Dr. Maisie Fushomas paged [phone], Dr. Renold DonGermeroth updated at [phone]. Case cancelled r/t low potassium. Patient to see primary MD for potassium replacement prior to rescheduling surgery. Patient updated and agrees.

## 2016-08-29 NOTE — Consult Note (Signed)
Patient to be admitted to ER r/t low potassium. Dr. Maisie Fushomas spoke to primary and concerned about non-compliance.

## 2016-08-29 NOTE — Anesthesia Preprocedure Evaluation (Addendum)
Anesthesia Evaluation  Patient identified by MRN, date of birth, ID band Patient awake    Reviewed: Allergy & Precautions, NPO status , Patient's Chart, lab work & pertinent test results  Airway        Dental   Pulmonary neg pulmonary ROS, Current Smoker,           Cardiovascular hypertension, negative cardio ROS  + Valvular Problems/Murmurs      Neuro/Psych negative neurological ROS  negative psych ROS   GI/Hepatic Neg liver ROS, GERD  ,  Endo/Other  negative endocrine ROS  Renal/GU negative Renal ROS     Musculoskeletal negative musculoskeletal ROS (+)   Abdominal   Peds  Hematology negative hematology ROS (+)   Anesthesia Other Findings   Reproductive/Obstetrics                             Anesthesia Physical Anesthesia Plan  ASA: III  Anesthesia Plan: General   Post-op Pain Management:    Induction: Intravenous  Airway Management Planned: Oral ETT  Additional Equipment:   Intra-op Plan:   Post-operative Plan: Extubation in OR  Informed Consent: I have reviewed the patients History and Physical, chart, labs and discussed the procedure including the risks, benefits and alternatives for the proposed anesthesia with the patient or authorized representative who has indicated his/her understanding and acceptance.   Dental advisory given  Plan Discussed with: CRNA  Anesthesia Plan Comments: (Severe hypokalemia in spite of supplemental.)      Anesthesia Quick Evaluation

## 2016-08-29 NOTE — Progress Notes (Signed)
Patient stated he completed K called in by MD

## 2016-08-29 NOTE — ED Notes (Signed)
Bed: [id number] Expected date:  Expected time:  Means of arrival:  Comments: OR pt

## 2016-08-29 NOTE — ED Triage Notes (Signed)
Patient was suppose to have surgery today but was cancelled due to low potassium.  Potassium on 11-1 was 2.1 and today at OR is was 2.0.

## 2016-08-29 NOTE — Discharge Instructions (Signed)
Follow with your primary care doctor.

## 2016-08-29 NOTE — ED Provider Notes (Signed)
[facility]-EMERGENCY DEPT Provider Note   CSN: [id number] Arrival date & time: 08/29/16  4098     History   Chief Complaint No chief complaint on file.   HPI Alvin Benton is a 65 y.o. male.  HPI Patient was sent from preop with a potassium of 2. It is been checked 2 days ago and found to be 2.1 at that time. Patient states his been on a lot of laxatives because of his diverticulitis and thinks it could drop tracing. States he feels fine. He was scheduled for a partial colectomy possible bladder repair. I discussed with Dr. Maisie Fus who sent the patient to the ER. States he no longer has a primary care doctor is Dr. Beverely Low was not seeing patients anymore. He states he is on losartan. No muscle problems. No contractions. No nausea vomiting or diarrhea. States he has to take laxatives otherwise he backs up and his diverticulitis flareup.   Past Medical History:  Diagnosis Date  . Blood in stool   . Diverticulitis   . ETOH abuse   . GERD (gastroesophageal reflux disease)   . Heart murmur   . History of urinary tract infection   . Hypertension   . IBS (irritable bowel syndrome)   . Migraine    "none in a long time" (03/14/2016)  . Pneumonia    childhood  . Seasonal allergies   . Tobacco abuse     Patient Active Problem List   Diagnosis Date Noted  . Alcohol withdrawal (HCC) 06/04/2016  . Volume depletion 06/04/2016  . Hypokalemia 06/04/2016  . Diverticulitis of intestine with perforation without bleeding 06/04/2016  . Essential hypertension   . Hypomagnesemia   . Ileus (HCC) 05/05/2016  . Systolic murmur 05/05/2016  . Anxiety state 04/19/2016  . 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 Hypoxia 04/05/2016  . Atrial fibrillation (HCC) 04/05/2016  . Diverticulitis of large intestine with perforation and abscess 03/14/2016  . Chronic constipation 03/08/2016  . Alcohol abuse 02/15/2016  . Tobacco use disorder 07/17/2015  . Allergic rhinitis 07/17/2015  . Reactive airway disease with  wheezing 07/17/2015  . Overweight (BMI 25.0-29.9) 07/17/2015  . Hand pain, left 01/12/2014  . LLQ pain-chronic, intermittent and episodic 09/17/2013    Past Surgical History:  Procedure Laterality Date  . COLONOSCOPY    . TONSILLECTOMY         Home Medications    Prior to Admission medications   Medication Sig Start Date End Date Taking? Authorizing Provider  albuterol (PROVENTIL HFA;VENTOLIN HFA) 108 (90 BASE) MCG/ACT inhaler Inhale 2 puffs into the lungs every 6 (six) hours as needed for wheezing or shortness of breath. 07/17/15  Yes Sheliah Hatch, MD  aspirin EC 81 MG tablet Take 81 mg by mouth daily.   Yes Historical Provider, MD  famotidine (PEPCID) 20 MG tablet Take 1 tablet (20 mg total) by mouth 2 (two) times daily. Patient taking differently: Take 20 mg by mouth as needed for heartburn or indigestion.  05/09/16  Yes Rodolph Bong, MD  Multiple Vitamin (MULTIVITAMIN WITH MINERALS) TABS tablet Take 1 tablet by mouth daily. 05/09/16  Yes Rodolph Bong, MD  nicotine (NICODERM CQ - DOSED IN MG/24 HOURS) 21 mg/24hr patch Place 1 patch (21 mg total) onto the skin daily. Patient taking differently: Place 21 mg onto the skin daily as needed (when is hospital).  05/09/16  Yes Rodolph Bong, MD  simethicone (GAS RELIEF) 80 MG chewable tablet Chew 80 mg by mouth every 6 (six) hours  as needed for flatulence.   Yes Historical Provider, MD  valsartan (DIOVAN) 160 MG tablet Take 160 mg by mouth daily. 07/12/16  Yes Historical Provider, MD  amLODipine (NORVASC) 10 MG tablet Take 1 tablet (10 mg total) by mouth daily. Patient not taking: Reported on 06/04/2016 05/09/16   Rodolph Bonganiel V Thompson, MD  amoxicillin-clavulanate (AUGMENTIN) 875-125 MG tablet Take 1 tablet by mouth every 12 (twelve) hours. For 5days Patient not taking: Reported on 08/29/2016 06/09/16   Zannie CovePreetha Joseph, MD  busPIRone (BUSPAR) 5 MG tablet Take 1 tablet (5 mg total) by mouth 2 (two) times daily. Patient not taking:  Reported on 08/26/2016 06/09/16   Zannie CovePreetha Joseph, MD  folic acid (FOLVITE) 1 MG tablet Take 1 tablet (1 mg total) by mouth daily. Patient not taking: Reported on 08/26/2016 03/08/16 03/08/17  Sheliah HatchKatherine E Tabori, MD  KLOR-CON 10 10 MEQ tablet Take 10 mEq by mouth daily. 08/28/16   Historical Provider, MD  potassium chloride SA (K-DUR,KLOR-CON) 20 MEQ tablet Take 2 tablets (40 mEq total) by mouth 2 (two) times daily. 08/29/16   Benjiman CoreNathan Kourtnie Sachs, MD    Family History Family History  Problem Relation Age of Onset  . Stroke Mother   . Colon cancer Neg Hx   . Esophageal cancer Neg Hx   . Rectal cancer Neg Hx   . Stomach cancer Neg Hx     Social History Social History  Substance Use Topics  . Smoking status: Current Every Day Smoker    Packs/day: 1.00    Years: 40.00    Types: Cigarettes  . Smokeless tobacco: Never Used  . Alcohol use Yes     Comment: pt states does not drink daily has beer with football     Allergies   Review of patient's allergies indicates no known allergies.   Review of Systems Review of Systems  Constitutional: Negative for appetite change.  HENT: Negative for dental problem.   Respiratory: Negative for chest tightness.   Cardiovascular: Negative for chest pain.  Gastrointestinal: Negative for abdominal pain.  Endocrine: Negative for polydipsia, polyphagia and polyuria.  Genitourinary: Negative for flank pain.  Musculoskeletal: Negative for back pain.  Skin: Negative for rash.  Neurological: Negative for dizziness, tremors and weakness.  Psychiatric/Behavioral: Negative for confusion.     Physical Exam Updated Vital Signs BP (!) 180/138 (BP Location: Left Arm)   Pulse 74   Temp 98 F (36.7 C) (Oral)   Resp 13   SpO2 96%   Physical Exam  Constitutional: He appears well-developed.  HENT:  Head: Atraumatic.  Neck: Neck supple.  Cardiovascular: Normal rate.   Pulmonary/Chest: Effort normal.  Abdominal: There is no tenderness.  Neurological: He  is alert.  Skin: Skin is warm. Capillary refill takes less than 2 seconds.  Psychiatric: He has a normal mood and affect.     ED Treatments / Results  Labs (all labs ordered are listed, but only abnormal results are displayed) Labs Reviewed  URINALYSIS, ROUTINE W REFLEX MICROSCOPIC (NOT AT [facility]) - Abnormal; Notable for the following:       Result Value   Color, Urine AMBER (*)    APPearance CLOUDY (*)    Bilirubin Urine MODERATE (*)    Ketones, ur 15 (*)    Leukocytes, UA MODERATE (*)    All other components within normal limits  URINE MICROSCOPIC-ADD ON - Abnormal; Notable for the following:    Squamous Epithelial / LPF 0-5 (*)    Bacteria, UA FEW (*)  Casts HYALINE CASTS (*)    All other components within normal limits  MAGNESIUM    EKG  EKG Interpretation  Date/Time:  Thursday August 29 2016 09:10:01 EDT Ventricular Rate:  66 PR Interval:    QRS Duration: 91 QT Interval:  475 QTC Calculation: 498 R Axis:   3 Text Interpretation:  Sinus rhythm Probable left atrial enlargement Anteroseptal infarct, old Confirmed by Rubin PayorPICKERING  MD, Rosland Riding [phone]([id number]) on 08/29/2016 12:02:00 PM       Radiology No results found.  Procedures Procedures (including critical care time)  Medications Ordered in ED Medications  potassium chloride 10 mEq in 100 mL IVPB (0 mEq Intravenous Stopped 08/29/16 1019)  magnesium sulfate IVPB 2 g 50 mL (0 g Intravenous Stopped 08/29/16 1031)  potassium chloride SA (K-DUR,KLOR-CON) CR tablet 40 mEq (40 mEq Oral Given 08/29/16 0917)     Initial Impression / Assessment and Plan / ED Course  I have reviewed the triage vital signs and the nursing notes.  Pertinent labs & imaging results that were available during my care of the patient were reviewed by me and considered in my medical decision making (see chart for details).  Clinical Course  Patient with hypokalemia. Sent from preop. Supplemented here. Normal magnesium. Will have follow-up as an  outpatient. Patient really does not want to be admitted. Negative safe for outpatient management. See cannot see the same primary care doctor but can find a new one. 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 Does not show clear infection of urine but possible fistulas likely the cause abnormalities. Will discharge  Final Clinical Impressions(s) / ED Diagnoses   Final diagnoses:  Hypokalemia    New Prescriptions New Prescriptions   POTASSIUM CHLORIDE SA (K-DUR,KLOR-CON) 20 MEQ TABLET    Take 2 tablets (40 mEq total) by mouth 2 (two) times daily.     Benjiman CoreNathan Fantasha Daniele, MD 08/29/16 1250

## 2016-08-29 NOTE — ED Notes (Signed)
Pt informed of need for urine specimen. Pt given urinal and told to call nurse when sample obtained.

## 2016-08-30 ENCOUNTER — Other Ambulatory Visit: Payer: Self-pay | Admitting: Urology

## 2016-09-05 NOTE — Progress Notes (Signed)
Need orders for 12-7 surgery in epic pre op is 11-30

## 2016-09-09 ENCOUNTER — Other Ambulatory Visit: Payer: Self-pay | Admitting: General Surgery

## 2016-09-13 ENCOUNTER — Emergency Department (HOSPITAL_COMMUNITY): Payer: Managed Care, Other (non HMO)

## 2016-09-13 ENCOUNTER — Encounter (HOSPITAL_COMMUNITY): Payer: Self-pay | Admitting: *Deleted

## 2016-09-13 ENCOUNTER — Ambulatory Visit (INDEPENDENT_AMBULATORY_CARE_PROVIDER_SITE_OTHER): Payer: Managed Care, Other (non HMO) | Admitting: Urgent Care

## 2016-09-13 ENCOUNTER — Emergency Department (HOSPITAL_COMMUNITY)
Admission: EM | Admit: 2016-09-13 | Discharge: 2016-09-13 | Disposition: A | Discharge status: Home / Self Care | Payer: Managed Care, Other (non HMO) | Attending: Emergency Medicine | Admitting: Emergency Medicine

## 2016-09-13 VITALS — BP 156/90 | HR 116 | Temp 97.9°F | Resp 18 | Ht 69.0 in | Wt 152.0 lb

## 2016-09-13 DIAGNOSIS — E876 Hypokalemia: Secondary | ICD-10-CM

## 2016-09-13 DIAGNOSIS — J45909 Unspecified asthma, uncomplicated: Secondary | ICD-10-CM | POA: Diagnosis not present

## 2016-09-13 DIAGNOSIS — K5900 Constipation, unspecified: Secondary | ICD-10-CM | POA: Diagnosis not present

## 2016-09-13 DIAGNOSIS — K5792 Diverticulitis of intestine, part unspecified, without perforation or abscess without bleeding: Secondary | ICD-10-CM

## 2016-09-13 DIAGNOSIS — R030 Elevated blood-pressure reading, without diagnosis of hypertension: Secondary | ICD-10-CM

## 2016-09-13 DIAGNOSIS — Z79899 Other long term (current) drug therapy: Secondary | ICD-10-CM | POA: Diagnosis not present

## 2016-09-13 DIAGNOSIS — R109 Unspecified abdominal pain: Secondary | ICD-10-CM | POA: Diagnosis not present

## 2016-09-13 DIAGNOSIS — F17210 Nicotine dependence, cigarettes, uncomplicated: Secondary | ICD-10-CM | POA: Diagnosis not present

## 2016-09-13 DIAGNOSIS — Z789 Other specified health status: Secondary | ICD-10-CM | POA: Diagnosis not present

## 2016-09-13 DIAGNOSIS — I10 Essential (primary) hypertension: Secondary | ICD-10-CM | POA: Diagnosis not present

## 2016-09-13 DIAGNOSIS — R110 Nausea: Secondary | ICD-10-CM

## 2016-09-13 DIAGNOSIS — Z7982 Long term (current) use of aspirin: Secondary | ICD-10-CM | POA: Diagnosis not present

## 2016-09-13 DIAGNOSIS — R1032 Left lower quadrant pain: Secondary | ICD-10-CM | POA: Insufficient documentation

## 2016-09-13 DIAGNOSIS — R9431 Abnormal electrocardiogram [ECG] [EKG]: Secondary | ICD-10-CM

## 2016-09-13 LAB — CBC WITH DIFFERENTIAL/PLATELET
Basophils Absolute: 0 10*3/uL (ref 0.0–0.1)
Basophils Relative: 0 %
Eosinophils Absolute: 0 10*3/uL (ref 0.0–0.7)
Eosinophils Relative: 0 %
HCT: 52.8 % — ABNORMAL HIGH (ref 39.0–52.0)
Hemoglobin: 17.9 g/dL — ABNORMAL HIGH (ref 13.0–17.0)
Lymphocytes Relative: 14 %
Lymphs Abs: 1.6 10*3/uL (ref 0.7–4.0)
MCH: 33.9 pg (ref 26.0–34.0)
MCHC: 33.9 g/dL (ref 30.0–36.0)
MCV: 100 fL (ref 78.0–100.0)
Monocytes Absolute: 0.8 10*3/uL (ref 0.1–1.0)
Monocytes Relative: 8 %
Neutro Abs: 8.5 10*3/uL — ABNORMAL HIGH (ref 1.7–7.7)
Neutrophils Relative %: 78 %
Platelets: 399 10*3/uL (ref 150–400)
RBC: 5.28 MIL/uL (ref 4.22–5.81)
RDW: 13.9 % (ref 11.5–15.5)
WBC: 10.9 10*3/uL — ABNORMAL HIGH (ref 4.0–10.5)

## 2016-09-13 LAB — COMPLETE METABOLIC PANEL WITH GFR
ALT: 11 U/L (ref 9–46)
AST: 23 U/L (ref 10–35)
Albumin: 4.1 g/dL (ref 3.6–5.1)
Alkaline Phosphatase: 56 U/L (ref 40–115)
BILIRUBIN TOTAL: 0.9 mg/dL (ref 0.2–1.2)
BUN: 10 mg/dL (ref 7–25)
CALCIUM: 10.4 mg/dL — AB (ref 8.6–10.3)
CO2: 22 mmol/L (ref 20–31)
CREATININE: 0.96 mg/dL (ref 0.70–1.25)
Chloride: 100 mmol/L (ref 98–110)
GFR, EST NON AFRICAN AMERICAN: 84 mL/min (ref >=60)
Glucose, Bld: 94 mg/dL (ref 65–99)
Potassium: 5.8 mmol/L — ABNORMAL HIGH (ref 3.5–5.3)
Sodium: 136 mmol/L (ref 135–146)
TOTAL PROTEIN: 7.8 g/dL (ref 6.1–8.1)

## 2016-09-13 LAB — POCT CBC
Granulocyte percent: 69.8 % (ref 37–80)
HCT, POC: 52.3 % (ref 43.5–53.7)
Hemoglobin: 18.7 g/dL — AB (ref 14.1–18.1)
Lymph, poc: 3.8 — AB (ref 0.6–3.4)
MCH, POC: 34.3 pg — AB (ref 27–31.2)
MCHC: 35.7 g/dL — AB (ref 31.8–35.4)
MCV: 96 fL (ref 80–97)
MID (cbc): 1 — AB (ref 0–0.9)
MPV: 8.7 fL (ref 0–99.8)
POC Granulocyte: 10.3 — AB (ref 2–6.9)
POC LYMPH PERCENT: 23.2 % (ref 10–50)
POC MID %: 7 % (ref 0–12)
Platelet Count, POC: 209 K/uL (ref 142–424)
RBC: 5.45 M/uL (ref 4.69–6.13)
RDW, POC: 14.5 %
WBC: 14.7 K/uL — AB (ref 4.6–10.2)

## 2016-09-13 LAB — I-STAT TROPONIN, ED: Troponin i, poc: 0.01 ng/mL (ref 0.00–0.08)

## 2016-09-13 LAB — COMPREHENSIVE METABOLIC PANEL
ALT: 13 U/L — ABNORMAL LOW (ref 17–63)
AST: 24 U/L (ref 15–41)
Albumin: 3.6 g/dL (ref 3.5–5.0)
Alkaline Phosphatase: 52 U/L (ref 38–126)
Anion gap: 11 (ref 5–15)
BUN: 10 mg/dL (ref 6–20)
CO2: 23 mmol/L (ref 22–32)
Calcium: 9.7 mg/dL (ref 8.9–10.3)
Chloride: 101 mmol/L (ref 101–111)
Creatinine, Ser: 0.96 mg/dL (ref 0.61–1.24)
GFR calc Af Amer: >60 mL/min (ref >60)
GFR calc non Af Amer: >60 mL/min (ref >60)
Glucose, Bld: 98 mg/dL (ref 65–99)
Potassium: 5.1 mmol/L (ref 3.5–5.1)
Sodium: 135 mmol/L (ref 135–145)
Total Bilirubin: 1.2 mg/dL (ref 0.3–1.2)
Total Protein: 7.5 g/dL (ref 6.5–8.1)

## 2016-09-13 LAB — MAGNESIUM: MAGNESIUM: 2.2 mg/dL (ref 1.7–2.4)

## 2016-09-13 MED ORDER — AMOXICILLIN-POT CLAVULANATE 875-125 MG PO TABS: 1.0000 | ORAL_TABLET | Freq: Three times a day (TID) | ORAL | 0 refills | Status: DC

## 2016-09-13 NOTE — Progress Notes (Addendum)
MRN: [id number] DOB: 10/22/1951  Subjective:   Alvin Benton is a 65 y.o. male presenting for chief complaint of Other (check potassium levels)  Patient was recently hypokalemic with a level of 2.0, last checked on 08/29/2016 just prior to an operation for partial colostomy and bladder repair due to diverticulitis. He was started on K-Dur 40meq twice daily. He is in the process of finding a PCP through his insurance but has not been able to find one yet. He denies chest pain, heart racing, palpitations, dizziness. He has had 1 day history of significant nausea, constipation, left-sided abdominal pain that radiates to his RLQ. These symptoms are similar to his previous episode of diverticulitis. He did undergo course of antibiotics in 06/2016 including ciprofloxacin followed by Augmentin, Flagyl. Denies fever, vomiting, bloody stools. Currently smoking less than 1ppd.    Alvin Benton has a current medication list which includes the following prescription(s): albuterol, aspirin ec, multivitamin with minerals, potassium chloride sa, simethicone, valsartan, amlodipine, buspirone, famotidine, folic acid, klor-con 10, and nicotine, and the following Facility-Administered Medications: lactated ringers. Also has No Known Allergies.  Alvin Benton  has a past medical history of Blood in stool; Diverticulitis; ETOH abuse; GERD (gastroesophageal reflux disease); Heart murmur; History of urinary tract infection; Hypertension; IBS (irritable bowel syndrome); Migraine; Pneumonia; Seasonal allergies; and Tobacco abuse. Also  has a past surgical history that includes Colonoscopy and Tonsillectomy.   Objective:   Vitals: BP (!) 156/90 (BP Location: Left Arm, Patient Position: Sitting, Cuff Size: Normal)   Pulse (!) 116   Temp 97.9 F (36.6 C)   Resp 18   Ht 5\' 9"  (1.753 m)   Wt 152 lb (68.9 kg)   SpO2 100%   BMI 22.45 kg/m   Physical Exam  Constitutional: He is oriented to person, place, and time. He appears  well-developed and well-nourished.  Cardiovascular: Normal rate, regular rhythm and intact distal pulses.  Exam reveals no gallop and no friction rub.   Murmur (systolic ejection murmur best heard over LUSB (not new)) heard. Pulmonary/Chest: No respiratory distress. He has no wheezes. He has no rales.  Abdominal: Soft. Bowel sounds are normal. He exhibits no distension and no mass. There is tenderness (mild over left side). There is no guarding.  Neurological: He is alert and oriented to person, place, and time.  Skin: Skin is warm and dry.   Results for orders placed or performed in visit on 09/13/16 (from the past 24 hour(s))  POCT CBC     Status: Abnormal   Collection Time: 09/13/16  8:55 AM  Result Value Ref Range   WBC 14.7 (A) 4.6 - 10.2 K/uL   Lymph, poc 3.8 (A) 0.6 - 3.4   POC LYMPH PERCENT 23.2 10 - 50 %L   MID (cbc) 1.0 (A) 0 - 0.9   POC MID % 7.0 0 - 12 %M   POC Granulocyte 10.3 (A) 2 - 6.9   Granulocyte percent 69.8 37 - 80 %G   RBC 5.45 4.69 - 6.13 M/uL   Hemoglobin 18.7 (A) 14.1 - 18.1 g/dL   HCT, POC 40.952.3 81.143.5 - 53.7 %   MCV 96.0 80 - 97 fL   MCH, POC 34.3 (A) 27 - 31.2 pg   MCHC 35.7 (A) 31.8 - 35.4 g/dL   RDW, POC 91.414.5 %   Platelet Count, POC 209 142 - 424 K/uL   MPV 8.7 0 - 99.8 fL   ECG interpretation - ST-elevations in V1-V2 which is changed from ecg on  08/30/2016, tachycardia of 102.  Assessment and Plan :   This case was precepted with Dr. Clelia CroftShaw.   1. Hypokalemia 2. Elevated blood pressure reading 3. Abnormal ecg 4. Frequent use of laxatives - Stat cmet is pending. Patient has significant changes on ecg, will send via EMS for r/o of heart event. He would like results reported to Romie LeveeAlicia Thomas, MD with [facility]. - Patient does not have a PCP, states that he is trying to establish with an Internist covered by his insurance but has been unsuccessful up to this point. I offered to help in the interim.  5. Diverticulitis of intestine without  perforation or abscess without bleeding, unspecified part of intestinal tract 6. Left sided abdominal pain 7. Constipation, unspecified constipation type 8. Nausea without vomiting - Given elevated wbc, left shift, history of diverticulitis and physical exam findings, I will have patient start Augmentin TID. Patient is in agreement.   Wallis BambergMario Pamelyn Bancroft, PA-C [facility] [phone] 09/13/2016 8:16 AM

## 2016-09-13 NOTE — ED Notes (Signed)
Pt is in stable condition upon d/c and ambulates from ED. 

## 2016-09-13 NOTE — Discharge Instructions (Signed)
Please follow up with your primary care provider for check of your potassium in 1 week.  Return to ER for new or worsening symptoms, any additional concerns.

## 2016-09-13 NOTE — Patient Instructions (Addendum)
Hypokalemia Hypokalemia means that the amount of potassium in the blood is lower than normal.Potassium is a chemical that helps regulate the amount of fluid in the body (electrolyte). It also stimulates muscle tightening (contraction) and helps nerves work properly.Normally, most of the body's potassium is inside of cells, and only a very small amount is in the blood. Because the amount in the blood is so small, minor changes to potassium levels in the blood can be life-threatening. What are the causes? This condition may be caused by:  Antibiotic medicine.  Diarrhea or vomiting. Taking too much of a medicine that helps you have a bowel movement (laxative) can cause diarrhea and lead to hypokalemia.  Chronic kidney disease (CKD).  Medicines that help the body get rid of excess fluid (diuretics).  Eating disorders, such as bulimia.  Low magnesium levels in the body.  Sweating a lot. What are the signs or symptoms? Symptoms of this condition include:  Weakness.  Constipation.  Fatigue.  Muscle cramps.  Mental confusion.  Skipped heartbeats or irregular heartbeat (palpitations).  Tingling or numbness. How is this diagnosed? This condition is diagnosed with a blood test. How is this treated? Hypokalemia can be treated by taking potassium supplements by mouth or adjusting the medicines that you take. Treatment may also include eating more foods that contain a lot of potassium. If your potassium level is very low, you may need to get potassium through an IV tube in one of your veins and be monitored in the hospital. Follow these instructions at home:  Take over-the-counter and prescription medicines only as told by your health care provider. This includes vitamins and supplements.  Eat a healthy diet. A healthy diet includes fresh fruits and vegetables, whole grains, healthy fats, and lean proteins.  If instructed, eat more foods that contain a lot of potassium, such  as:  Nuts, such as peanuts and pistachios.  Seeds, such as sunflower seeds and pumpkin seeds.  Peas, lentils, and lima beans.  Whole grain and bran cereals and breads.  Fresh fruits and vegetables, such as apricots, avocado, bananas, cantaloupe, kiwi, oranges, tomatoes, asparagus, and potatoes.  Orange juice.  Tomato juice.  Red meats.  Yogurt.  Keep all follow-up visits as told by your health care provider. This is important. Contact a health care provider if:  You have weakness that gets worse.  You feel your heart pounding or racing.  You vomit.  You have diarrhea.  You have diabetes (diabetes mellitus) and you have trouble keeping your blood sugar (glucose) in your target range. Get help right away if:  You have chest pain.  You have shortness of breath.  You have vomiting or diarrhea that lasts for more than 2 days.  You faint. This information is not intended to replace advice given to you by your health care provider. Make sure you discuss any questions you have with your health care provider. Document Released: 10/14/2005 Document Revised: 06/01/2016 Document Reviewed: 06/01/2016 Elsevier Interactive Patient Education  2017 [employer] Inc.     Diverticulitis Diverticulitis is inflammation or infection of small pouches in your colon that form when you have a condition called diverticulosis. The pouches in your colon are called diverticula. Your colon, or large intestine, is where water is absorbed and stool is formed. Complications of diverticulitis can include:  Bleeding.  Severe infection.  Severe pain.  Perforation of your colon.  Obstruction of your colon. What are the causes? Diverticulitis is caused by bacteria. Diverticulitis happens when stool becomes  trapped in diverticula. This allows bacteria to grow in the diverticula, which can lead to inflammation and infection. What increases the risk? People with diverticulosis are at risk for  diverticulitis. Eating a diet that does not include enough fiber from fruits and vegetables may make diverticulitis more likely to develop. What are the signs or symptoms? Symptoms of diverticulitis may include:  Abdominal pain and tenderness. The pain is normally located on the left side of the abdomen, but may occur in other areas.  Fever and chills.  Bloating.  Cramping.  Nausea.  Vomiting.  Constipation.  Diarrhea.  Blood in your stool. How is this diagnosed? Your health care provider will ask you about your medical history and do a physical exam. You may need to have tests done because many medical conditions can cause the same symptoms as diverticulitis. Tests may include:  Blood tests.  Urine tests.  Imaging tests of the abdomen, including X-rays and CT scans. When your condition is under control, your health care provider may recommend that you have a colonoscopy. A colonoscopy can show how severe your diverticula are and whether something else is causing your symptoms. How is this treated? Most cases of diverticulitis are mild and can be treated at home. Treatment may include:  Taking over-the-counter pain medicines.  Following a clear liquid diet.  Taking antibiotic medicines by mouth for 7-10 days. More severe cases may be treated at a hospital. Treatment may include:  Not eating or drinking.  Taking prescription pain medicine.  Receiving antibiotic medicines through an IV tube.  Receiving fluids and nutrition through an IV tube.  Surgery. Follow these instructions at home:  Follow your health care provider's instructions carefully.  Follow a full liquid diet or other diet as directed by your health care provider. After your symptoms improve, your health care provider may tell you to change your diet. He or she may recommend you eat a high-fiber diet. Fruits and vegetables are good sources of fiber. Fiber makes it easier to pass stool.  Take fiber  supplements or probiotics as directed by your health care provider.  Only take medicines as directed by your health care provider.  Keep all your follow-up appointments. Contact a health care provider if:  Your pain does not improve.  You have a hard time eating food.  Your bowel movements do not return to normal. Get help right away if:  Your pain becomes worse.  Your symptoms do not get better.  Your symptoms suddenly get worse.  You have a fever.  You have repeated vomiting.  You have bloody or black, tarry stools. This information is not intended to replace advice given to you by your health care provider. Make sure you discuss any questions you have with your health care provider. Document Released: 07/24/2005 Document Revised: 03/21/2016 Document Reviewed: 09/08/2013 Elsevier Interactive Patient Education  2017 [employer]     IF you received an x-ray today, you will receive an invoice from [facility]. Please contact [facility] at [phone] with questions or concerns regarding your invoice.   IF you received labwork today, you will receive an invoice from [facility]. Please contact [facility] at [phone] with questions or concerns regarding your invoice.   Our billing staff will not be able to assist you with questions regarding bills from these companies.  You will be contacted with the lab results as soon as they are available. The fastest way to get your results is to activate your My  Chart account. Instructions are located on the last page of this paperwork. If you have not heard from [place] regarding the results in 2 weeks, please contact this office.

## 2016-09-13 NOTE — ED Provider Notes (Signed)
[facility]-EMER[facility]ENCY DEPT Provider Note   CSN: [id number] Arrival date & time: 09/13/16  1032     History   Chief Complaint Chief Complaint  Patient presents with  . Abnormal ECG    HPI Alvin Benton is a 65 y.o. male.  The history is provided by the patient and medical records. No language interpreter was used.   Alvin Benton is a 65 y.o. male  with a PMH of HTN, diverticulitis followed by Dr. Maisie Fus with surgery scheduled for 12/07 who presents to the Emergency Department from PCP for an abnormal EKG. Patient states that he was planning to have Reticulitis procedure earlier this month, however his potassium was noted to be much too low for procedure to be performed safely. He has been placed on potassium and has been taking this medication as directed for a little over a week now. He was seen by his PCP today to get his potassium levels checked. He was noted to be mildly tachycardic at 102 and have changes on EKG. Given history of hypokalemia, he was sent to ED for further evaluation and to rule out cardiac event. Patient denies chest pain, shortness of breath, diaphoresis. His only complaint at present is left-sided abdominal pain due to his known diverticular disease.   Past Medical History:  Diagnosis Date  . Blood in stool   . Diverticulitis   . ETOH abuse   . GERD (gastroesophageal reflux disease)   . Heart murmur   . History of urinary tract infection   . Hypertension   . IBS (irritable bowel syndrome)   . Migraine    "none in a long time" (03/14/2016)  . Pneumonia    childhood  . Seasonal allergies   . Tobacco abuse     Patient Active Problem List   Diagnosis Date Noted  . Alcohol withdrawal (HCC) 06/04/2016  . Volume depletion 06/04/2016  . Hypokalemia 06/04/2016  . Diverticulitis of intestine with perforation without bleeding 06/04/2016  . Essential hypertension   . Hypomagnesemia   . Ileus (HCC) 05/05/2016  . Systolic murmur 05/05/2016  . Anxiety state  04/19/2016  . 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 Hypoxia 04/05/2016  . Atrial fibrillation (HCC) 04/05/2016  . Diverticulitis of large intestine with perforation and abscess 03/14/2016  . Chronic constipation 03/08/2016  . Alcohol abuse 02/15/2016  . Tobacco use disorder 07/17/2015  . Allergic rhinitis 07/17/2015  . Reactive airway disease with wheezing 07/17/2015  . Overweight (BMI 25.0-29.9) 07/17/2015  . Hand pain, left 01/12/2014  . LLQ pain-chronic, intermittent and episodic 09/17/2013    Past Surgical History:  Procedure Laterality Date  . COLONOSCOPY    . TONSILLECTOMY         Home Medications    Prior to Admission medications   Medication Sig Start Date End Date Taking? Authorizing Provider  aspirin EC 81 MG tablet Take 81 mg by mouth 4 (four) times a week.    Yes Historical Provider, MD  Multiple Vitamin (MULTIVITAMIN WITH MINERALS) TABS tablet Take 1 tablet by mouth daily. 05/09/16  Yes Rodolph Bong, MD  potassium chloride SA (K-DUR,KLOR-CON) 20 MEQ tablet Take 2 tablets (40 mEq total) by mouth 2 (two) times daily. 08/29/16  Yes Benjiman Core, MD  Simethicone (GAS RELIEF PO) Take 1 tablet by mouth every 6 (six) hours as needed for flatulence.    Yes Historical Provider, MD  valsartan (DIOVAN) 160 MG tablet Take 160 mg by mouth daily. 07/12/16  Yes Historical Provider, MD  albuterol (PROVENTIL HFA;VENTOLIN HFA)  108 (90 BASE) MCG/ACT inhaler Inhale 2 puffs into the lungs every 6 (six) hours as needed for wheezing or shortness of breath. 07/17/15   Sheliah HatchKatherine E Tabori, MD  amoxicillin-clavulanate (AUGMENTIN) 875-125 MG tablet Take 1 tablet by mouth 3 (three) times daily with meals. Patient not taking: Reported on 09/13/2016 09/13/16   Wallis BambergMario Mani, PA-C    Family History Family History  Problem Relation Age of Onset  . Stroke Mother   . Colon cancer Neg Hx   . Esophageal cancer Neg Hx   . Rectal cancer Neg Hx   . Stomach cancer Neg Hx     Social History Social  History  Substance Use Topics  . Smoking status: Current Every Day Smoker    Packs/day: 1.00    Years: 40.00    Types: Cigarettes  . Smokeless tobacco: Never Used  . Alcohol use Yes     Comment: pt states does not drink daily has beer with football     Allergies   Patient has no known allergies.   Review of Systems Review of Systems  Constitutional: Negative for chills and fever.  HENT: Negative for congestion.   Eyes: Negative for visual disturbance.  Respiratory: Negative for cough and shortness of breath.   Cardiovascular: Negative.   Gastrointestinal: Positive for abdominal pain and nausea. Negative for vomiting.  Genitourinary: Negative for dysuria.  Musculoskeletal: Negative for back pain and neck pain.  Skin: Negative for color change.  Neurological: Negative for headaches.     Physical Exam Updated Vital Signs BP 164/91   Pulse 97   Temp 98.4 F (36.9 C) (Oral)   Resp 21   SpO2 97%   Physical Exam  Constitutional: He is oriented to person, place, and time. He appears well-developed and well-nourished. No distress.  HENT:  Head: Normocephalic and atraumatic.  Cardiovascular: Normal rate and regular rhythm.   Murmur (SEM, known to patient) heard. Pulmonary/Chest: Effort normal and breath sounds normal. No respiratory distress. He has no wheezes. He has no rales. He exhibits no tenderness.  Abdominal: Soft. Bowel sounds are normal. He exhibits no distension. There is tenderness (LLQ). There is no rebound and no guarding.  Musculoskeletal: He exhibits no edema.  Neurological: He is alert and oriented to person, place, and time.  Skin: Skin is warm and dry.  Nursing note and vitals reviewed.    ED Treatments / Results  Labs (all labs ordered are listed, but only abnormal results are displayed) Labs Reviewed  COMPREHENSIVE METABOLIC PANEL - Abnormal; Notable for the following:       Result Value   ALT 13 (*)    All other components within normal limits    CBC WITH DIFFERENTIAL/PLATELET - Abnormal; Notable for the following:    WBC 10.9 (*)    Hemoglobin 17.9 (*)    HCT 52.8 (*)    Neutro Abs 8.5 (*)    All other components within normal limits  MAGNESIUM  I-STAT TROPOININ, ED    EKG  EKG Interpretation  Date/Time:  Friday September 13 2016 10:35:48 EST Ventricular Rate:  97 PR Interval:    QRS Duration: 77 QT Interval:  369 QTC Calculation: 469 R Axis:   16 Text Interpretation:  Sinus rhythm Probable left atrial enlargement Probable left ventricular hypertrophy Anterior Q waves, possibly due to LVH Confirmed by Juleen ChinaKOHUT  MD, STEPHEN [id number]) on 09/13/2016 11:19:46 AM       Radiology Dg Chest 2 View  Result Date: 09/13/2016 CLINICAL DATA:  Abnormal  EKG. No current complaints. History of hypertension, current smoker. EXAM: CHEST  2 VIEW COMPARISON:  Chest x-ray of May 05, 2016 FINDINGS: The lungs are reasonably well inflated. The interstitial markings are coarse though stable. There is no alveolar infiltrate. There is no pleural effusion. The heart and pulmonary vascularity are normal. The mediastinum is normal in width. There is mild multilevel degenerative disc disease of the thoracic spine. IMPRESSION: Mild chronic bronchitic changes. No pneumonia, CHF, nor other acute cardiopulmonary abnormality. Electronically Signed   By: David  [place] M.D.   On: 09/13/2016 11:41    Procedures Procedures (including critical care time)  Medications Ordered in ED Medications - No data to display   Initial Impression / Assessment and Plan / ED Course  I have reviewed the triage vital signs and the nursing notes.  Pertinent labs & imaging results that were available during my care of the patient were reviewed by me and considered in my medical decision making (see chart for details).  Clinical Course    Roselee NovaScott Pola is a 65 y.o. male who presents to ED from PCP for an abnormal ekg. Patient has history of hypokalemia and on PO potassium. Will  check cbc, bmp, and troponin. EKG reviewed with attending and appears c/w LVH. Patient has no complaints of chest pain, shortness of breath, diaphoresis or back pain.   Troponin negative. CBC with mildly elevated white count at 10.9 and shift of 8.5. He has known diverticulitis and on antibiotics for this. Likely cause of leukocytosis. CMP with K+ of 5.1. Patient states he is no longer vomiting and diarrhea is very much improved as well - this was thought to be cause of hypokalemia. Will decrease potassium supplementation and have patient follow up for repeat BMP in 1 week. Reasons to return to ER were discussed and all questions answered.   Patient discussed with Dr. Juleen ChinaKohut who agrees with treatment plan.   Final Clinical Impressions(s) / ED Diagnoses   Final diagnoses:  Abnormal EKG    New Prescriptions New Prescriptions   No medications on file     [facility] Pil[facility] Malosi Hemstreet, PA-C 09/13/16 1205    Raeford RazorStephen Kohut, MD 09/23/16 1131

## 2016-09-13 NOTE — ED Triage Notes (Signed)
[facility]. Pt states he was going in for repeat blood tests rt f/u for hypokalemia. Pt states he has no complaints, but does have EKG changes.

## 2016-09-17 ENCOUNTER — Telehealth: Payer: Self-pay

## 2016-09-17 NOTE — Telephone Encounter (Signed)
Pt calling about his prescription in regards to his potassium levels  He needs to know if he should continue taking the potassium chloride or if he needs something different he has surgery December 7th and last time it was cancelled because his levels were too low he has seen mani recently and says mani should be aware of his levels   Contact [phone]

## 2016-09-23 NOTE — Telephone Encounter (Signed)
Routed to BiboMani. Please advise.

## 2016-09-23 NOTE — Telephone Encounter (Signed)
His last K+ level was within normal limits. This was last checked on 09/13/2016 when [facility]. That level was 5.1. I called patient and left a VM. At this point I am unsure if his surgery will require consist K+ levels to be within normal limits. If this is the case, then patient can come in and have a recheck. We also discussed with the patient the fact that we need to find a source for his hypokalemia as opposed to continuing to replace his potassium due to an unknown etiology.

## 2016-09-26 ENCOUNTER — Encounter (HOSPITAL_COMMUNITY): Payer: Self-pay

## 2016-09-26 ENCOUNTER — Encounter (HOSPITAL_COMMUNITY)
Admission: RE | Admit: 2016-09-26 | Discharge: 2016-09-26 | Disposition: A | Discharge status: Home / Self Care | Payer: Managed Care, Other (non HMO) | Source: Ambulatory Visit | Attending: General Surgery | Admitting: General Surgery

## 2016-09-26 DIAGNOSIS — Z01812 Encounter for preprocedural laboratory examination: Secondary | ICD-10-CM | POA: Diagnosis not present

## 2016-09-26 DIAGNOSIS — K632 Fistula of intestine: Secondary | ICD-10-CM | POA: Insufficient documentation

## 2016-09-26 HISTORY — DX: Nocturia: R35.1

## 2016-09-26 LAB — BASIC METABOLIC PANEL
Anion gap: 9 (ref 5–15)
BUN: 9 mg/dL (ref 6–20)
CHLORIDE: 101 mmol/L (ref 101–111)
CO2: 28 mmol/L (ref 22–32)
CREATININE: 0.74 mg/dL (ref 0.61–1.24)
Calcium: 9.1 mg/dL (ref 8.9–10.3)
GFR calc non Af Amer: >60 mL/min (ref >60)
Glucose, Bld: 90 mg/dL (ref 65–99)
POTASSIUM: 3.7 mmol/L (ref 3.5–5.1)
SODIUM: 138 mmol/L (ref 135–145)

## 2016-09-26 NOTE — Consult Note (Signed)
WOC Nurse ostomy consult note WOC Nurse requested for preoperative stoma site marking by Dr. Maisie Fushomas.  Date of reschdeduled procedure is 10/03/16.  Discussed surgical procedure and stoma creation with patient.  Explained role of the WOC nurse team.  Answered patient and questions.   Examined patient sitting, and standing in order to place the marking in the patient's visual field, away from any creases or abdominal contour issues and within the rectus muscle.  Patient wears his belt low on the abdomen so marks are above belt line.   Marked for colostomy in the LLQ  6.5cm to the left of the umbilicus and 1cm below the umbilicus.  Marked for ileostomy in the RLQ  6cm to the right of the umbilicus and  1cm above the umbilicus.  Patient's abdomen cleansed with CHG wipes at site markings, allowed to air dry prior to marking.Covered mark with thin film transparent dressing to preserve mark until date of surgery.    WOC nursing team will stand by and remain available to this patient and his surgical team in the event an ostomy is created on 12/7.  Please reconsult if that is the case. Thanks, Ladona MowLaurie Kynedi Profitt, MSN, RN, GNP, Hans EdenCWOCN, CWON-AP, FAAN  Pager# [phone]

## 2016-09-26 NOTE — Patient Instructions (Signed)
Alvin Benton  09/26/2016   Your procedure is scheduled on: Thursday October 03, 2016  [facility]  Entrance [place]  elevators to 3rd floor to  Short Stay Center at 5:15 AM.  Call this number if you have problems the morning of surgery [phone]   Remember: ONLY 1 PERSON MAY GO WITH YOU TO SHORT STAY TO GET  READY MORNING OF YOUR SURGERY.  Do not eat food or drink liquids :After Midnight.     Take these medicines the morning of surgery:May use albuterol inhaler if needed                                 You may not have any metal on your body including hair pins and              piercings  Do not wear jewelry,  lotions, powders or colognes, deodorant                    Men may shave face and neck.   Do not bring valuables to the hospital. [facility] IS NOT             RESPONSIBLE   FOR VALUABLES.  Contacts, dentures or bridgework may not be worn into surgery.  Leave suitcase in the car. After surgery it may be brought to your room.   Special Instructions: FOLLOW SURGEON'S INSTRUCTIONS IN REGARDS TO BOWEL PREPARATION PRIOR TO SURGICAL PROCEDURE DATE               _____________________________________________________________________             [facility] - Preparing for Surgery Before surgery, you can play an important role.  Because skin is not sterile, your skin needs to be as free of germs as possible.  You can reduce the number of germs on your skin by washing with CHG (chlorahexidine gluconate) soap before surgery.  CHG is an antiseptic cleaner which kills germs and bonds with the skin to continue killing germs even after washing. Please DO NOT use if you have an allergy to CHG or antibacterial soaps.  If your skin becomes reddened/irritated stop using the CHG and inform your nurse when you arrive at Short Stay. Do not shave (including legs and underarms) for at least 48 hours prior to the first CHG shower.  You may shave your  face/neck. Please follow these instructions carefully:  1.  Shower with CHG Soap the night before surgery and the  morning of Surgery.  2.  If you choose to wash your hair, wash your hair first as usual with your  normal  shampoo.  3.  After you shampoo, rinse your hair and body thoroughly to remove the  shampoo.                           4.  Use CHG as you would any other liquid soap.  You can apply chg directly  to the skin and wash                       Gently with a scrungie or clean washcloth.  5.  Apply the CHG Soap to your body ONLY FROM THE NECK DOWN.   Do not use on face/  open                           Wound or open sores. Avoid contact with eyes, ears mouth and genitals (private parts).                       Wash face,  Genitals (private parts) with your normal soap.             6.  Wash thoroughly, paying special attention to the area where your surgery  will be performed.  7.  Thoroughly rinse your body with warm water from the neck down.  8.  DO NOT shower/wash with your normal soap after using and rinsing off  the CHG Soap.                9.  Pat yourself dry with a clean towel.            10.  Wear clean pajamas.            11.  Place clean sheets on your bed the night of your first shower and do not  sleep with pets. Day of Surgery : Do not apply any lotions/deodorants the morning of surgery.  Please wear clean clothes to the hospital/surgery center.  FAILURE TO FOLLOW THESE INSTRUCTIONS MAY RESULT IN THE CANCELLATION OF YOUR SURGERY PATIENT SIGNATURE_________________________________  NURSE SIGNATURE__________________________________  ________________________________________________________________________

## 2016-10-02 NOTE — Anesthesia Preprocedure Evaluation (Addendum)
Anesthesia Evaluation  Patient identified by MRN, date of birth, ID band Patient awake    History of Anesthesia Complications Negative for: history of anesthetic complications  Airway Mallampati: II  TM Distance: >3 FB Neck ROM: Full    Dental  (+) Teeth Intact, Dental Advisory Given   Pulmonary Current Smoker,    breath sounds clear to auscultation       Cardiovascular hypertension, + Valvular Problems/Murmurs  Rhythm:Regular Rate:Normal + Systolic murmurs Recent hypokalemia being rx'd   Neuro/Psych    GI/Hepatic Neg liver ROS, GERD  ,  Endo/Other    Renal/GU negative Renal ROS     Musculoskeletal   Abdominal   Peds  Hematology negative hematology ROS (+)   Anesthesia Other Findings   Reproductive/Obstetrics                            Anesthesia Physical Anesthesia Plan  ASA: II  Anesthesia Plan: General   Post-op Pain Management:    Induction: Intravenous  Airway Management Planned: Oral ETT  Additional Equipment:   Intra-op Plan:   Post-operative Plan: Extubation in OR  Informed Consent: I have reviewed the patients History and Physical, chart, labs and discussed the procedure including the risks, benefits and alternatives for the proposed anesthesia with the patient or authorized representative who has indicated his/her understanding and acceptance.     Plan Discussed with: CRNA  Anesthesia Plan Comments:        Anesthesia Quick Evaluation

## 2016-10-03 ENCOUNTER — Inpatient Hospital Stay (HOSPITAL_COMMUNITY)
Admission: RE | Admit: 2016-10-03 | Discharge: 2016-10-07 | DRG: 330 | Disposition: A | Discharge status: Home / Self Care | Payer: Managed Care, Other (non HMO) | Source: Ambulatory Visit | Attending: General Surgery | Admitting: General Surgery

## 2016-10-03 ENCOUNTER — Encounter (HOSPITAL_COMMUNITY): Payer: Self-pay | Admitting: *Deleted

## 2016-10-03 ENCOUNTER — Inpatient Hospital Stay (HOSPITAL_COMMUNITY): Payer: Managed Care, Other (non HMO) | Admitting: Anesthesiology

## 2016-10-03 ENCOUNTER — Encounter (HOSPITAL_COMMUNITY)
Admission: RE | Disposition: A | Discharge status: Home / Self Care | Payer: Self-pay | Source: Ambulatory Visit | Attending: General Surgery

## 2016-10-03 DIAGNOSIS — I10 Essential (primary) hypertension: Secondary | ICD-10-CM | POA: Diagnosis present

## 2016-10-03 DIAGNOSIS — N321 Vesicointestinal fistula: Secondary | ICD-10-CM | POA: Diagnosis present

## 2016-10-03 DIAGNOSIS — Z7984 Long term (current) use of oral hypoglycemic drugs: Secondary | ICD-10-CM

## 2016-10-03 DIAGNOSIS — Z79899 Other long term (current) drug therapy: Secondary | ICD-10-CM | POA: Diagnosis not present

## 2016-10-03 DIAGNOSIS — Z792 Long term (current) use of antibiotics: Secondary | ICD-10-CM

## 2016-10-03 DIAGNOSIS — Z79891 Long term (current) use of opiate analgesic: Secondary | ICD-10-CM

## 2016-10-03 DIAGNOSIS — K5720 Diverticulitis of large intestine with perforation and abscess without bleeding: Principal | ICD-10-CM | POA: Diagnosis present

## 2016-10-03 DIAGNOSIS — F1010 Alcohol abuse, uncomplicated: Secondary | ICD-10-CM | POA: Diagnosis present

## 2016-10-03 HISTORY — PX: PROCTOSCOPY: SHX2266

## 2016-10-03 HISTORY — PX: CYSTOSCOPY WITH STENT PLACEMENT: SHX5790

## 2016-10-03 LAB — POCT I-STAT 4, (NA,K, GLUC, HGB,HCT)
Glucose, Bld: 103 mg/dL — ABNORMAL HIGH (ref 65–99)
HCT: 51 % (ref 39.0–52.0)
HEMOGLOBIN: 17.3 g/dL — AB (ref 13.0–17.0)
Potassium: 3.9 mmol/L (ref 3.5–5.1)
Sodium: 141 mmol/L (ref 135–145)

## 2016-10-03 LAB — TYPE AND SCREEN
ABO/RH(D): A POS
ANTIBODY SCREEN: NEGATIVE

## 2016-10-03 SURGERY — COLECTOMY, PARTIAL, ROBOT-ASSISTED, LAPAROSCOPIC
Anesthesia: General | Site: Abdomen

## 2016-10-03 MED ORDER — LORAZEPAM 2 MG/ML IJ SOLN
0.0000 mg | Freq: Two times a day (BID) | INTRAMUSCULAR | Status: DC
  Administered 2016-10-05 – 2016-10-06 (×2): 1 mg via INTRAVENOUS
  Filled 2016-10-03 (×3): qty 1

## 2016-10-03 MED ORDER — DIPHENHYDRAMINE HCL 50 MG/ML IJ SOLN: 12.5000 mg | Freq: Four times a day (QID) | INTRAMUSCULAR | Status: DC | PRN

## 2016-10-03 MED ORDER — HEPARIN SODIUM (PORCINE) 5000 UNIT/ML IJ SOLN
INTRAMUSCULAR | Status: AC
  Filled 2016-10-03: qty 1

## 2016-10-03 MED ORDER — EPHEDRINE 5 MG/ML INJ
INTRAVENOUS | Status: AC
  Filled 2016-10-03: qty 10

## 2016-10-03 MED ORDER — STERILE WATER FOR IRRIGATION IR SOLN
Status: DC | PRN
Start: 2016-10-03 — End: 2016-10-03
  Administered 2016-10-03: 3000 mL

## 2016-10-03 MED ORDER — PROPOFOL 10 MG/ML IV BOLUS
INTRAVENOUS | Status: DC | PRN
  Administered 2016-10-03: 170 mg via INTRAVENOUS

## 2016-10-03 MED ORDER — ONDANSETRON HCL 4 MG/2ML IJ SOLN
4.0000 mg | Freq: Four times a day (QID) | INTRAMUSCULAR | Status: DC | PRN
  Administered 2016-10-04: 4 mg via INTRAVENOUS
  Filled 2016-10-03: qty 2

## 2016-10-03 MED ORDER — FENTANYL CITRATE (PF) 100 MCG/2ML IJ SOLN
INTRAMUSCULAR | Status: DC | PRN
  Administered 2016-10-03 (×7): 50 ug via INTRAVENOUS
  Administered 2016-10-03: 100 ug via INTRAVENOUS

## 2016-10-03 MED ORDER — 0.9 % SODIUM CHLORIDE (POUR BTL) OPTIME
TOPICAL | Status: DC | PRN
  Administered 2016-10-03: 2000 mL

## 2016-10-03 MED ORDER — METHYLENE BLUE 0.5 % INJ SOLN
INTRAVENOUS | Status: AC
  Filled 2016-10-03: qty 10

## 2016-10-03 MED ORDER — ACETAMINOPHEN 500 MG PO TABS
1000.0000 mg | ORAL_TABLET | ORAL | Status: AC
  Administered 2016-10-03: 1000 mg via ORAL
  Filled 2016-10-03: qty 2

## 2016-10-03 MED ORDER — PROPOFOL 10 MG/ML IV BOLUS
INTRAVENOUS | Status: AC
  Filled 2016-10-03: qty 20

## 2016-10-03 MED ORDER — IRBESARTAN 150 MG PO TABS
150.0000 mg | ORAL_TABLET | Freq: Every day | ORAL | Status: DC
  Administered 2016-10-03 – 2016-10-07 (×5): 150 mg via ORAL
  Filled 2016-10-03 (×5): qty 1

## 2016-10-03 MED ORDER — MIDAZOLAM HCL 5 MG/5ML IJ SOLN
INTRAMUSCULAR | Status: DC | PRN
  Administered 2016-10-03: 2 mg via INTRAVENOUS

## 2016-10-03 MED ORDER — LABETALOL HCL 5 MG/ML IV SOLN
INTRAVENOUS | Status: DC | PRN
  Administered 2016-10-03: 5 mg via INTRAVENOUS

## 2016-10-03 MED ORDER — LORAZEPAM 2 MG/ML IJ SOLN: 1.0000 mg | Freq: Four times a day (QID) | INTRAMUSCULAR | Status: AC | PRN

## 2016-10-03 MED ORDER — DEXTROSE 5 % IV SOLN: 2.0000 g | Freq: Once | INTRAVENOUS | Status: DC

## 2016-10-03 MED ORDER — FENTANYL CITRATE (PF) 250 MCG/5ML IJ SOLN
INTRAMUSCULAR | Status: AC
  Filled 2016-10-03: qty 5

## 2016-10-03 MED ORDER — LORAZEPAM 1 MG PO TABS
1.0000 mg | ORAL_TABLET | Freq: Four times a day (QID) | ORAL | Status: AC | PRN
  Administered 2016-10-04 – 2016-10-06 (×4): 1 mg via ORAL
  Filled 2016-10-03 (×4): qty 1

## 2016-10-03 MED ORDER — GABAPENTIN 300 MG PO CAPS
300.0000 mg | ORAL_CAPSULE | ORAL | Status: AC
  Administered 2016-10-03: 300 mg via ORAL
  Filled 2016-10-03: qty 1

## 2016-10-03 MED ORDER — HYDROMORPHONE HCL 2 MG/ML IJ SOLN
INTRAMUSCULAR | Status: AC
  Filled 2016-10-03: qty 1

## 2016-10-03 MED ORDER — ALBUTEROL SULFATE HFA 108 (90 BASE) MCG/ACT IN AERS: 2.0000 | INHALATION_SPRAY | Freq: Four times a day (QID) | RESPIRATORY_TRACT | Status: DC | PRN

## 2016-10-03 MED ORDER — BUPIVACAINE HCL (PF) 0.25 % IJ SOLN
INTRAMUSCULAR | Status: DC | PRN
  Administered 2016-10-03: 30 mL

## 2016-10-03 MED ORDER — SODIUM CHLORIDE 0.9 % IJ SOLN
INTRAMUSCULAR | Status: AC
  Filled 2016-10-03: qty 10

## 2016-10-03 MED ORDER — LIDOCAINE 2% (20 MG/ML) 5 ML SYRINGE
INTRAMUSCULAR | Status: AC
  Filled 2016-10-03: qty 5

## 2016-10-03 MED ORDER — ACETAMINOPHEN 500 MG PO TABS
1000.0000 mg | ORAL_TABLET | Freq: Four times a day (QID) | ORAL | Status: AC
  Administered 2016-10-03 – 2016-10-04 (×4): 1000 mg via ORAL
  Filled 2016-10-03 (×4): qty 2

## 2016-10-03 MED ORDER — LORAZEPAM 2 MG/ML IJ SOLN
0.0000 mg | Freq: Four times a day (QID) | INTRAMUSCULAR | Status: AC
Start: 2016-10-03 — End: 2016-10-05

## 2016-10-03 MED ORDER — ENOXAPARIN SODIUM 40 MG/0.4ML ~~LOC~~ SOLN
40.0000 mg | SUBCUTANEOUS | Status: DC
  Administered 2016-10-04 – 2016-10-07 (×4): 40 mg via SUBCUTANEOUS
  Filled 2016-10-03 (×4): qty 0.4

## 2016-10-03 MED ORDER — LIDOCAINE HCL (CARDIAC) 20 MG/ML IV SOLN
INTRAVENOUS | Status: DC | PRN
  Administered 2016-10-03: 100 mg via INTRAVENOUS

## 2016-10-03 MED ORDER — SUCCINYLCHOLINE CHLORIDE 200 MG/10ML IV SOSY
PREFILLED_SYRINGE | INTRAVENOUS | Status: AC
  Filled 2016-10-03: qty 10

## 2016-10-03 MED ORDER — ONDANSETRON HCL 4 MG/2ML IJ SOLN
INTRAMUSCULAR | Status: DC | PRN
  Administered 2016-10-03: 4 mg via INTRAVENOUS

## 2016-10-03 MED ORDER — DEXTROSE 5 % IV SOLN
2.0000 g | Freq: Two times a day (BID) | INTRAVENOUS | Status: DC
  Administered 2016-10-03: 2 g via INTRAVENOUS
  Filled 2016-10-03: qty 2

## 2016-10-03 MED ORDER — ROCURONIUM BROMIDE 50 MG/5ML IV SOSY
PREFILLED_SYRINGE | INTRAVENOUS | Status: AC
  Filled 2016-10-03: qty 5

## 2016-10-03 MED ORDER — HYDROMORPHONE HCL 1 MG/ML IJ SOLN
INTRAMUSCULAR | Status: AC
  Administered 2016-10-03: 0.5 mg via INTRAVENOUS
  Filled 2016-10-03: qty 1

## 2016-10-03 MED ORDER — SUGAMMADEX SODIUM 200 MG/2ML IV SOLN
INTRAVENOUS | Status: AC
  Filled 2016-10-03: qty 2

## 2016-10-03 MED ORDER — SUCCINYLCHOLINE CHLORIDE 200 MG/10ML IV SOSY
PREFILLED_SYRINGE | INTRAVENOUS | Status: DC | PRN
  Administered 2016-10-03: 100 mg via INTRAVENOUS

## 2016-10-03 MED ORDER — METHYLENE BLUE 0.5 % INJ SOLN
INTRAVENOUS | Status: DC | PRN
  Administered 2016-10-03: 10 mL via SUBMUCOSAL

## 2016-10-03 MED ORDER — DEXTROSE 5 % IV SOLN
2.0000 g | Freq: Once | INTRAVENOUS | Status: AC
  Administered 2016-10-03: 2 g via INTRAVENOUS
  Filled 2016-10-03: qty 2

## 2016-10-03 MED ORDER — BUPIVACAINE LIPOSOME 1.3 % IJ SUSP
20.0000 mL | Freq: Once | INTRAMUSCULAR | Status: DC
  Filled 2016-10-03: qty 20

## 2016-10-03 MED ORDER — ALBUTEROL SULFATE (2.5 MG/3ML) 0.083% IN NEBU: 2.5000 mg | INHALATION_SOLUTION | Freq: Four times a day (QID) | RESPIRATORY_TRACT | Status: DC | PRN

## 2016-10-03 MED ORDER — STERILE WATER FOR IRRIGATION IR SOLN
Status: DC | PRN
Start: 2016-10-03 — End: 2016-10-03
  Administered 2016-10-03: 1000 mL

## 2016-10-03 MED ORDER — SODIUM CHLORIDE 0.9 % IV SOLN: Freq: Once | INTRAVENOUS | Status: DC

## 2016-10-03 MED ORDER — HYDROMORPHONE HCL 1 MG/ML IJ SOLN
0.2500 mg | INTRAMUSCULAR | Status: DC | PRN
  Administered 2016-10-03 (×4): 0.5 mg via INTRAVENOUS

## 2016-10-03 MED ORDER — KCL IN DEXTROSE-NACL 20-5-0.45 MEQ/L-%-% IV SOLN
INTRAVENOUS | Status: DC
  Administered 2016-10-03: 100 mL/h via INTRAVENOUS
  Administered 2016-10-04 (×2): 1000 mL via INTRAVENOUS
  Administered 2016-10-04 – 2016-10-05 (×2): via INTRAVENOUS
  Filled 2016-10-03 (×7): qty 1000

## 2016-10-03 MED ORDER — HEPARIN SODIUM (PORCINE) 5000 UNIT/ML IJ SOLN
5000.0000 [IU] | Freq: Once | INTRAMUSCULAR | Status: AC
  Administered 2016-10-03: 5000 [IU] via SUBCUTANEOUS

## 2016-10-03 MED ORDER — LABETALOL HCL 5 MG/ML IV SOLN
INTRAVENOUS | Status: AC
  Filled 2016-10-03: qty 4

## 2016-10-03 MED ORDER — EPHEDRINE SULFATE-NACL 50-0.9 MG/10ML-% IV SOSY
PREFILLED_SYRINGE | INTRAVENOUS | Status: DC | PRN
  Administered 2016-10-03: 5 mg via INTRAVENOUS

## 2016-10-03 MED ORDER — ONDANSETRON HCL 4 MG PO TABS: 4.0000 mg | ORAL_TABLET | Freq: Four times a day (QID) | ORAL | Status: DC | PRN

## 2016-10-03 MED ORDER — DIPHENHYDRAMINE HCL 12.5 MG/5ML PO ELIX: 12.5000 mg | ORAL_SOLUTION | Freq: Four times a day (QID) | ORAL | Status: DC | PRN

## 2016-10-03 MED ORDER — LACTATED RINGERS IV SOLN
INTRAVENOUS | Status: DC | PRN
  Administered 2016-10-03: 07:00:00 via INTRAVENOUS

## 2016-10-03 MED ORDER — PROMETHAZINE HCL 25 MG/ML IJ SOLN: 6.2500 mg | INTRAMUSCULAR | Status: DC | PRN

## 2016-10-03 MED ORDER — LACTATED RINGERS IV SOLN: INTRAVENOUS | Status: DC

## 2016-10-03 MED ORDER — HYDRALAZINE HCL 20 MG/ML IJ SOLN
INTRAMUSCULAR | Status: DC | PRN
  Administered 2016-10-03: 2.5 mg via INTRAVENOUS

## 2016-10-03 MED ORDER — CEFOTETAN DISODIUM-DEXTROSE 2-2.08 GM-% IV SOLR
INTRAVENOUS | Status: AC
  Filled 2016-10-03: qty 50

## 2016-10-03 MED ORDER — HYDROMORPHONE HCL 1 MG/ML IJ SOLN
INTRAMUSCULAR | Status: AC
  Filled 2016-10-03: qty 0.5

## 2016-10-03 MED ORDER — ALVIMOPAN 12 MG PO CAPS
12.0000 mg | ORAL_CAPSULE | Freq: Once | ORAL | Status: AC
  Administered 2016-10-03: 12 mg via ORAL
  Filled 2016-10-03: qty 1

## 2016-10-03 MED ORDER — INDOCYANINE GREEN 25 MG IV SOLR
INTRAVENOUS | Status: DC | PRN
Start: 2016-10-03 — End: 2016-10-03
  Administered 2016-10-03: 7.5 mg via INTRAVENOUS

## 2016-10-03 MED ORDER — SUGAMMADEX SODIUM 200 MG/2ML IV SOLN
INTRAVENOUS | Status: DC | PRN
  Administered 2016-10-03: 150 mg via INTRAVENOUS

## 2016-10-03 MED ORDER — BUPIVACAINE HCL (PF) 0.25 % IJ SOLN
INTRAMUSCULAR | Status: AC
  Filled 2016-10-03: qty 30

## 2016-10-03 MED ORDER — ROCURONIUM BROMIDE 10 MG/ML (PF) SYRINGE
PREFILLED_SYRINGE | INTRAVENOUS | Status: DC | PRN
  Administered 2016-10-03 (×2): 10 mg via INTRAVENOUS
  Administered 2016-10-03 (×2): 20 mg via INTRAVENOUS
  Administered 2016-10-03: 50 mg via INTRAVENOUS
  Administered 2016-10-03: 10 mg via INTRAVENOUS

## 2016-10-03 MED ORDER — DEXAMETHASONE SODIUM PHOSPHATE 10 MG/ML IJ SOLN
INTRAMUSCULAR | Status: DC | PRN
  Administered 2016-10-03: 10 mg via INTRAVENOUS

## 2016-10-03 MED ORDER — CELECOXIB 200 MG PO CAPS
400.0000 mg | ORAL_CAPSULE | ORAL | Status: AC
  Administered 2016-10-03: 400 mg via ORAL
  Filled 2016-10-03: qty 2

## 2016-10-03 MED ORDER — ONDANSETRON HCL 4 MG/2ML IJ SOLN
INTRAMUSCULAR | Status: AC
  Filled 2016-10-03: qty 2

## 2016-10-03 MED ORDER — FENTANYL CITRATE (PF) 100 MCG/2ML IJ SOLN
INTRAMUSCULAR | Status: AC
  Filled 2016-10-03: qty 2

## 2016-10-03 MED ORDER — DEXAMETHASONE SODIUM PHOSPHATE 10 MG/ML IJ SOLN
INTRAMUSCULAR | Status: AC
  Filled 2016-10-03: qty 1

## 2016-10-03 MED ORDER — HYDRALAZINE HCL 20 MG/ML IJ SOLN
INTRAMUSCULAR | Status: AC
  Filled 2016-10-03: qty 1

## 2016-10-03 MED ORDER — ALVIMOPAN 12 MG PO CAPS
12.0000 mg | ORAL_CAPSULE | Freq: Two times a day (BID) | ORAL | Status: DC
  Administered 2016-10-04 – 2016-10-05 (×3): 12 mg via ORAL
  Filled 2016-10-03 (×3): qty 1

## 2016-10-03 MED ORDER — MORPHINE SULFATE (PF) 2 MG/ML IV SOLN
2.0000 mg | INTRAVENOUS | Status: DC | PRN
Start: 2016-10-03 — End: 2016-10-07
  Administered 2016-10-03 – 2016-10-04 (×4): 2 mg via INTRAVENOUS
  Administered 2016-10-04 (×3): 4 mg via INTRAVENOUS
  Administered 2016-10-04 (×2): 2 mg via INTRAVENOUS
  Administered 2016-10-04 – 2016-10-05 (×4): 4 mg via INTRAVENOUS
  Filled 2016-10-03 (×2): qty 1
  Filled 2016-10-03: qty 2
  Filled 2016-10-03 (×3): qty 1
  Filled 2016-10-03 (×2): qty 2
  Filled 2016-10-03: qty 1
  Filled 2016-10-03 (×4): qty 2

## 2016-10-03 MED ORDER — MIDAZOLAM HCL 2 MG/2ML IJ SOLN
INTRAMUSCULAR | Status: AC
  Filled 2016-10-03: qty 2

## 2016-10-03 SURGICAL SUPPLY — 100 items
ADAPTER GOLDBERG URETERAL (ADAPTER) ×4 IMPLANT
BAG URO CATCHER STRL LF (MISCELLANEOUS) ×4 IMPLANT
BLADE EXTENDED COATED 6.5IN (ELECTRODE) IMPLANT
CANNULA REDUC XI 12-8 STAPL (CANNULA) ×1
CANNULA REDUCER 12-8 DVNC XI (CANNULA) ×3 IMPLANT
CATH URET 5FR 28IN OPEN ENDED (CATHETERS) ×4 IMPLANT
CELLS DAT CNTRL 66122 CELL SVR (MISCELLANEOUS) IMPLANT
CLIP LIGATING HEM O LOK PURPLE (MISCELLANEOUS) IMPLANT
CLIP LIGATING HEMOLOK MED (MISCELLANEOUS) IMPLANT
CLOTH BEACON ORANGE TIMEOUT ST (SAFETY) ×4 IMPLANT
COUNTER NEEDLE 20 DBL MAG RED (NEEDLE) ×4 IMPLANT
COVER MAYO STAND STRL (DRAPES) ×8 IMPLANT
COVER TIP SHEARS 8 DVNC (MISCELLANEOUS) ×3 IMPLANT
COVER TIP SHEARS 8MM DA VINCI (MISCELLANEOUS) ×1
DECANTER SPIKE VIAL GLASS SM (MISCELLANEOUS) IMPLANT
DERMABOND ADVANCED (GAUZE/BANDAGES/DRESSINGS) ×1
DERMABOND ADVANCED .7 DNX12 (GAUZE/BANDAGES/DRESSINGS) ×3 IMPLANT
DEVICE TROCAR PUNCTURE CLOSURE (ENDOMECHANICALS) IMPLANT
DRAIN CHANNEL 19F RND (DRAIN) IMPLANT
DRAPE ARM DVNC X/XI (DISPOSABLE) ×12 IMPLANT
DRAPE COLUMN DVNC XI (DISPOSABLE) ×3 IMPLANT
DRAPE DA VINCI XI ARM (DISPOSABLE) ×4
DRAPE DA VINCI XI COLUMN (DISPOSABLE) ×1
DRAPE SURG IRRIG POUCH 19X23 (DRAPES) ×4 IMPLANT
DRSG OPSITE POSTOP 4X10 (GAUZE/BANDAGES/DRESSINGS) IMPLANT
DRSG OPSITE POSTOP 4X6 (GAUZE/BANDAGES/DRESSINGS) ×4 IMPLANT
DRSG OPSITE POSTOP 4X8 (GAUZE/BANDAGES/DRESSINGS) IMPLANT
ELECT PENCIL ROCKER SW 15FT (MISCELLANEOUS) IMPLANT
ELECT REM PT RETURN 15FT ADLT (MISCELLANEOUS) ×4 IMPLANT
ENDOLOOP SUT PDS II  0 18 (SUTURE)
ENDOLOOP SUT PDS II 0 18 (SUTURE) IMPLANT
EVACUATOR SILICONE 100CC (DRAIN) IMPLANT
GAUZE SPONGE 4X4 12PLY STRL (GAUZE/BANDAGES/DRESSINGS) IMPLANT
GLOVE BIO SURGEON STRL SZ 6.5 (GLOVE) ×12 IMPLANT
GLOVE BIOGEL PI IND STRL 7.0 (GLOVE) ×9 IMPLANT
GLOVE BIOGEL PI INDICATOR 7.0 (GLOVE) ×3
GLOVE SURG SS PI 8.0 STRL IVOR (GLOVE) IMPLANT
GOWN STRL REUS W/TWL 2XL LVL3 (GOWN DISPOSABLE) ×12 IMPLANT
GOWN STRL REUS W/TWL XL LVL3 (GOWN DISPOSABLE) ×20 IMPLANT
GRASPER ENDOPATH ANVIL 10MM (MISCELLANEOUS) ×4 IMPLANT
GUIDEWIRE STR DUAL SENSOR (WIRE) ×4 IMPLANT
HOLDER FOLEY CATH W/STRAP (MISCELLANEOUS) ×4 IMPLANT
IRRIG SUCT STRYKERFLOW 2 WTIP (MISCELLANEOUS)
IRRIGATION SUCT STRKRFLW 2 WTP (MISCELLANEOUS) IMPLANT
KIT PROCEDURE DA VINCI SI (MISCELLANEOUS) ×2
KIT PROCEDURE DVNC SI (MISCELLANEOUS) ×6 IMPLANT
LEGGING LITHOTOMY PAIR STRL (DRAPES) ×4 IMPLANT
MANIFOLD NEPTUNE II (INSTRUMENTS) ×4 IMPLANT
NEEDLE INSUFFLATION 14GA 120MM (NEEDLE) ×4 IMPLANT
PACK CARDIOVASCULAR III (CUSTOM PROCEDURE TRAY) ×4 IMPLANT
PACK COLON (CUSTOM PROCEDURE TRAY) ×4 IMPLANT
PACK CYSTO (CUSTOM PROCEDURE TRAY) ×4 IMPLANT
PORT LAP GEL ALEXIS MED 5-9CM (MISCELLANEOUS) ×4 IMPLANT
RTRCTR WOUND ALEXIS 18CM MED (MISCELLANEOUS)
SCISSORS LAP 5X35 DISP (ENDOMECHANICALS) ×4 IMPLANT
SEAL CANN UNIV 5-8 DVNC XI (MISCELLANEOUS) ×9 IMPLANT
SEAL XI 5MM-8MM UNIVERSAL (MISCELLANEOUS) ×3
SEALER VESSEL DA VINCI XI (MISCELLANEOUS) ×1
SEALER VESSEL EXT DVNC XI (MISCELLANEOUS) ×3 IMPLANT
SET BI-LUMEN FLTR TB AIRSEAL (TUBING) ×4 IMPLANT
SET IRRIG TUBING LAPAROSCOPIC (IRRIGATION / IRRIGATOR) ×4 IMPLANT
SET IRRIG Y TYPE TUR BLADDER L (SET/KITS/TRAYS/PACK) ×4 IMPLANT
SLEEVE ADV FIXATION 5X100MM (TROCAR) IMPLANT
SOLUTION ELECTROLUBE (MISCELLANEOUS) ×4 IMPLANT
STAPLER 45 BLU RELOAD XI (STAPLE) ×3 IMPLANT
STAPLER 45 BLUE RELOAD XI (STAPLE) ×1
STAPLER 45 GREEN RELOAD XI (STAPLE) ×2
STAPLER 45 GRN RELOAD XI (STAPLE) ×6 IMPLANT
STAPLER CANNULA SEAL DVNC XI (STAPLE) ×3 IMPLANT
STAPLER CANNULA SEAL XI (STAPLE) ×1
STAPLER CIRC ILS CVD 33MM 37CM (STAPLE) ×4 IMPLANT
STAPLER SHEATH (SHEATH) ×1
STAPLER SHEATH ENDOWRIST DVNC (SHEATH) ×3 IMPLANT
STAPLER VISISTAT 35W (STAPLE) ×4 IMPLANT
SUT ETHILON 2 0 PS N (SUTURE) IMPLANT
SUT NOVA NAB GS-21 0 18 T12 DT (SUTURE) ×12 IMPLANT
SUT PDS AB 1 CTX 36 (SUTURE) IMPLANT
SUT PDS AB 1 TP1 96 (SUTURE) IMPLANT
SUT PROLENE 2 0 KS (SUTURE) ×4 IMPLANT
SUT SILK 2 0 (SUTURE) ×1
SUT SILK 2 0 SH CR/8 (SUTURE) ×4 IMPLANT
SUT SILK 2-0 18XBRD TIE 12 (SUTURE) ×3 IMPLANT
SUT SILK 3 0 (SUTURE) ×1
SUT SILK 3 0 SH CR/8 (SUTURE) ×4 IMPLANT
SUT SILK 3-0 18XBRD TIE 12 (SUTURE) ×3 IMPLANT
SUT V-LOC BARB 180 2/0GR6 GS22 (SUTURE) ×4
SUT VIC AB 2-0 SH 18 (SUTURE) IMPLANT
SUT VIC AB 2-0 SH 27 (SUTURE) ×1
SUT VIC AB 2-0 SH 27X BRD (SUTURE) ×3 IMPLANT
SUT VIC AB 3-0 SH 18 (SUTURE) IMPLANT
SUT VIC AB 4-0 PS2 27 (SUTURE) ×8 IMPLANT
SUTURE V-LC BRB 180 2/0GR6GS22 (SUTURE) ×3 IMPLANT
SYR 10ML LL (SYRINGE) ×4 IMPLANT
SYS LAPSCP GELPORT 120MM (MISCELLANEOUS)
SYSTEM LAPSCP GELPORT 120MM (MISCELLANEOUS) IMPLANT
TOWEL OR 17X26 10 PK STRL BLUE (TOWEL DISPOSABLE) IMPLANT
TOWEL OR NON WOVEN STRL DISP B (DISPOSABLE) ×4 IMPLANT
TRAY FOLEY W/METER SILVER 16FR (SET/KITS/TRAYS/PACK) ×4 IMPLANT
TROCAR ADV FIXATION 5X100MM (TROCAR) ×4 IMPLANT
TUBING CONNECTING 10 (TUBING) ×4 IMPLANT

## 2016-10-03 NOTE — Anesthesia Procedure Notes (Signed)
Procedure Name: Intubation Date/Time: 10/03/2016 7:38 AM Performed by: Jarvis NewcomerARMISTEAD, Caden Fatica A Pre-anesthesia Checklist: Patient identified, Timeout performed, Emergency Drugs available, Suction available and Patient being monitored Patient Re-evaluated:Patient Re-evaluated prior to inductionOxygen Delivery Method: Circle system utilized Preoxygenation: Pre-oxygenation with 100% oxygen Intubation Type: IV induction Ventilation: Mask ventilation without difficulty Laryngoscope Size: Mac and 4 Grade View: Grade I Tube type: Oral Tube size: 7.5 mm Number of attempts: 1 Airway Equipment and Method: Stylet Placement Confirmation: ETT inserted through vocal cords under direct vision,  positive ETCO2 and breath sounds checked- equal and bilateral Secured at: 22 cm Tube secured with: Tape Dental Injury: Teeth and Oropharynx as per pre-operative assessment

## 2016-10-03 NOTE — Anesthesia Postprocedure Evaluation (Signed)
Anesthesia Post Note  Patient: Corporate treasurercott Mould  Procedure(s) Performed: Procedure(s) (LRB): ROBOT ASSISTED SIGMOIDECTOMY, MOBILIZATION OF SPLENIC FLEXURE (N/A) CYSTOSCOPY WITH  LEFT STENT PLACEMENT (Left) PROCTOSCOPY  Patient location during evaluation: PACU Anesthesia Type: General Level of consciousness: awake and alert Pain management: pain level controlled Vital Signs Assessment: post-procedure vital signs reviewed and stable Respiratory status: spontaneous breathing, nonlabored ventilation, respiratory function stable and patient connected to nasal cannula oxygen Cardiovascular status: blood pressure returned to baseline and stable Postop Assessment: no signs of nausea or vomiting Anesthetic complications: no    Last Vitals:  Vitals:   10/03/16 1502 10/03/16 1554  BP: (!) 152/76 (!) 143/70  Pulse: 86 77  Resp: 16 16  Temp:      Last Pain:  Vitals:   10/03/16 1524  TempSrc:   PainSc: 7                  Daley Mooradian,JAMES TERRILL

## 2016-10-03 NOTE — Transfer of Care (Signed)
Immediate Anesthesia Transfer of Care Note  Patient: Alvin Benton  Procedure(s) Performed: Procedure(s): ROBOT ASSISTED SIGMOIDECTOMY, MOBILIZATION OF SPLENIC FLEXURE (N/A) CYSTOSCOPY WITH  LEFT STENT PLACEMENT (Left) PROCTOSCOPY  Patient Location: PACU  Anesthesia Type:General  Level of Consciousness: awake, alert , oriented and patient cooperative  Airway & Oxygen Therapy: Patient Spontanous Breathing and Patient connected to face mask oxygen  Post-op Assessment: Report given to RN, Post -op Vital signs reviewed and stable and Patient moving all extremities  Post vital signs: Reviewed and stable  Last Vitals:  Vitals:   10/03/16 0516  BP: (!) 171/73  Pulse: 95  Resp: 16  Temp: 36.6 C    Last Pain:  Vitals:   10/03/16 0539  TempSrc:   PainSc: 0-No pain      Patients Stated Pain Goal: 4 (10/03/16 0539)  Complications: No apparent anesthesia complications

## 2016-10-03 NOTE — H&P (Signed)
History of Present Illness  The patient is a 65 year old male who presents to discuss consultation. Alvin PicketScott returns for follow-up status post hospitalization for complicated sigmoid diverticulitis with colovesical fistula and abscess in Aug. He was treated medically in the hospital. He was discharged on antibiotics and told to f/u in the office. His pain has currently resolved on his antibiotics. He continues to have some pneumaturia.   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 Aspirin (81MG  Tablet DR, Oral) Active. LORazepam (1MG  Tablet, Oral) Active. Medications Reconciled Augmentin (875-125MG  Tablet, 1 (one) Tablet Oral twice a day, Taken starting 07/08/2016) Active. Neomycin Sulfate (500MG  Tablet, 2 (two) Tablet Oral SEE NOTE, Taken starting 07/08/2016) Active. (TAKE TWO TABLETS AT 2 PM, 3 PM, AND 10 PM THE DAY PRIOR TO SURGERY) Flagyl (500MG  Tablet, 2 (two) Tablet Oral SEE NOTE, Taken starting 07/08/2016) Active. (Take at 2pm, 3pm, and 10pm the day prior to your colon operation) Norco (7.5-325MG  Tablet, 1 (one) Tablet Oral 3 times a day as needed, Taken starting 04/03/2016) Active. Cipro (500MG  Tablet, 1 (one) Tablet Oral two times daily, Taken starting 07/02/2016) Active. Aspirin (81MG  Tablet, Oral) Active.  Social History  Caffeine use Coffee. Alcohol use Heavy to Moderate alcohol use. No drug use Tobacco use Current every day smoker.  Family History  Hypertension Father. Melanoma Brother.    Review of Systems General Present- Appetite Loss. Not Present- Chills, Fatigue, Fever, Night Sweats, Weight Gain and Weight  Loss. Skin Not Present- Change in Wart/Mole, Dryness, Hives, Jaundice, New Lesions, Non-Healing Wounds, Rash and Ulcer. HEENT Not Present- Earache, Hearing Loss, Hoarseness, Nose Bleed, Oral Ulcers, Ringing in the Ears, Seasonal Allergies, Sinus Pain, Sore Throat, Visual Disturbances, Wears glasses/contact lenses and Yellow Eyes. Respiratory Not Present- Bloody sputum, Chronic Cough, Difficulty Breathing, Snoring and Wheezing. Cardiovascular Not Present- Chest Pain, Difficulty Breathing Lying Down, Leg Cramps, Palpitations, Rapid Heart Rate, Shortness of Breath and Swelling of Extremities. Gastrointestinal Present- Abdominal Pain and Chronic diarrhea. Not Present- Bloating, Bloody Stool, Change in Bowel Habits, Constipation, Difficulty Swallowing, Excessive gas, Gets full quickly at meals, Hemorrhoids, Indigestion, Nausea, Rectal Pain and Vomiting. Male Genitourinary Present- Painful Urination. Not Present- Blood in Urine, Change in Urinary Stream, Frequency, Impotence, Nocturia, Urgency and Urine Leakage. Musculoskeletal Not Present- Back Pain, Joint Pain, Joint Stiffness, Muscle Pain, Muscle Weakness and Swelling of Extremities. Neurological Not Present- Decreased Memory, Fainting, Headaches, Numbness, Seizures, Tingling, Tremor, Trouble walking and Weakness. Psychiatric Not Present- Anxiety, Bipolar, Change in Sleep Pattern, Depression, Fearful and Frequent crying. Endocrine Not Present- Cold Intolerance, Excessive Hunger, Hair Changes, Heat Intolerance, Hot flashes and New Diabetes. Hematology Not Present- Blood Thinners, Easy Bruising, Excessive bleeding, Gland problems, HIV and Persistent Infections.  BP (!) 171/73   Pulse 95   Temp 97.8 F (36.6 C) (Oral)   Resp 16   Ht 5\' 9"  (1.753 m)   Wt 71.2 kg (157 lb)   SpO2 100%   BMI 23.18 kg/m     Physical Exam General Note: no distress   Head and Neck Note: normocephalic   Chest and Lung Exam Note:  CTA   Cardiovascular Note: RRR   Abdomen Palpation/Percussion Palpation and Percussion of the abdomen reveal - Soft and Non Tender.  Abdominal Mass Palpable - Location - Left Lower Quadrant. Note: Soft, a fullness is present in the left lower quadrant with minimal tenderness, no other masses felt   Neuropsychiatric Note: Anxious mood     Assessment & Plan Alvin Benton(Armarion Greek MD; 07/08/2016 3:14 PM) DIVERTICULITIS, COLON (K57.32) Impression: 65 year old male with a colovesicular fistula and what appears to be chronic, smoldering diverticulitis. He is currently on antibiotics and feeling somewhat better. I have recommended that we continue these antibiotics through till surgery. We will plan on trying to perform a minimally invasive operation in approximately 1 month. The surgery and anatomy were described to the patient as well as the risks of surgery and the possible complications. These include: Bleeding, deep abdominal infections and possible wound complications such as hernia and infection, damage to adjacent structures, leak of surgical connections, which can lead to other surgeries and possibly an ostomy, possible need for other procedures, such as abscess drains in radiology, possible prolonged hospital stay, possible diarrhea from removal of part of the colon, possible constipation from narcotics, possible bowel, bladder or sexual dysfunction if having rectal surgery, prolonged fatigue/weakness or appetite loss, possible early recurrence of of disease, possible complications of their medical problems such as heart disease or arrhythmias or lung problems, death (less than 1%). I believe the patient understands and wishes to proceed with the surgery.

## 2016-10-03 NOTE — Op Note (Signed)
10/03/2016  12:35 PM  PATIENT:  Alvin Benton  65 y.o. male  Patient Care Team: No Pcp Per Patient as PCP - General (General Practice)  PRE-OPERATIVE DIAGNOSIS:  COLOVESICAL FISTULA  POST-OPERATIVE DIAGNOSIS:  COLOVESICAL FISTULA  PROCEDURE:   ROBOT ASSISTED SIGMOIDECTOMY, MOBILIZATION OF SPLENIC FLEXURE CYSTOSCOPY WITH  LEFT STENT PLACEMENT PROCTOSCOPY    Surgeon(s): Romie LeveeAlicia Nathanuel Cabreja, MD Bjorn PippinJohn Wrenn, MD Almond LintFaera Byerly, MD  ASSISTANT: Dr Donell BeersByerly   ANESTHESIA:   local and general  EBL: 200ml  Total I/O In: 1000 [I.V.:1000] Out: 130 [Urine:80; Blood:50]  Delay start of Pharmacological VTE agent (>24hrs) due to surgical blood loss or risk of bleeding:  no  DRAINS: none   SPECIMEN:  Source of Specimen:  Sigmoid colon  DISPOSITION OF SPECIMEN:  PATHOLOGY  COUNTS:  YES  PLAN OF CARE: Admit to inpatient   PATIENT DISPOSITION:  PACU - hemodynamically stable.  INDICATION:    65 y.o. M with colovesical fistula.  I recommended segmental resection:  The anatomy & physiology of the digestive tract was discussed.  The pathophysiology was discussed.  Natural history risks without surgery was discussed.   I worked to give an overview of the disease and the frequent need to have multispecialty involvement.  I feel the risks of no intervention will lead to serious problems that outweigh the operative risks; therefore, I recommended a partial colectomy to remove the pathology.  Laparoscopic & open techniques were discussed.   Risks such as bleeding, infection, abscess, leak, reoperation, possible ostomy, hernia, heart attack, death, and other risks were discussed.  I noted a good likelihood this will help address the problem.   Goals of post-operative recovery were discussed as well.    The patient expressed understanding & wished to proceed with surgery.  OR FINDINGS:   Patient had significant inflammation of his entire proximal sigmoid colon with adherence to the L pelvic sidewall and  bladder.   The anastomosis rests 12 cm from the anal verge by rigid proctoscopy.  DESCRIPTION:   Informed consent was confirmed.  The patient underwent general anaesthesia without difficulty.  The patient was positioned appropriately.  VTE prevention in place.  The patient's abdomen was clipped, prepped, & draped in a sterile fashion.  Surgical timeout confirmed our plan.  The patient was positioned in reverse Trendelenburg.  Abdominal entry was gained using a Varies needle in the LUQ.  I placed a 8 mm port in the RUQ.  Entry was clean.  I induced carbon dioxide insufflation.  Camera inspection revealed no injury.  Extra ports were carefully placed under direct laparoscopic visualization.  The robot was docked to the patient's left side. Instruments were placed under direct visualization. I began by dissecting a piece of small bowel away from the sigmoid colon using sharp dissection. The small bowel was densely adherent to the colon with a approximately 3 cm area of sterile abscess cavity. Once this was resected, I placed this in the upper abdomen. I reflected the greater omentum and the upper abdomen the small bowel in the upper abdomen. I scored the base of peritoneum of the right side of the mesentery of the left colon from the ligament of Treitz to the peritoneal reflection of the mid rectum.  I elevated the sigmoid mesentery and enetered into the retro-mesenteric plane. We were able to identify the left ureter and gonadal vessels. We kept those posterior within the retroperitoneum and elevated the left colon mesentery off that. I did isolated IMA pedicle but did not ligate  it yet.  I continued distally and got into the avascular plane posterior to the mesorectum. This allowed me to help mobilize the rectum as well by freeing the mesorectum off the sacrum.  I mobilized the peritoneal coverings towards the peritoneal reflection on both the right and left sides of the rectum.  I could see the right and  left ureters and stayed away from them.   I skeletonized the inferior mesenteric artery pedicle. I isolated the inferior mesenteric vein off of the ligament of Treitz just cephalad to that as well.  After confirming the left ureter was out of the way, I went ahead and ligated the inferior mesenteric artery pedicle with the robotic sealer well above its takeoff from the aorta.   I did ligate the inferior mesenteric vein in a similar fashion.  We ensured hemostasis. I skeletonized the mesorectum at the junction at the proximal rectum using blunt dissection & bipolar vessel sealer.  I then began to mobilized the left colon in a lateral to medial fashion off the line of Toldt up towards the splenic flexure to ensure good mobilization of the left colon to reach into the pelvis.  Since the ureter was freed away from the area of inflammation below, I was able to dissect along the left pelvic sidewall separating the bladder from the colon as well as separating the colon from the inflammation and scar along the sidewall. I continue to monitor the ureter throughout its entire dissection into the pelvis. Over the next 2-3 hours we were able to mobilize the colon away from the ureter and the left pelvic sidewall. Once this was completely free the rectosigmoid junction was divided using 2 robotic green load staplers. I divided the mesentery proximal to the area of inflammation also using the robotic vessel sealer.  At this point I desufflated the abdomen and enlarged 12 mm port site in the right lower quadrant. An Alexis wound protector was placed. The colon was removed from the abdomen and transected at an area free from inflammation in the distal descending colon. A 33 mm EEA a.m. bowl was then placed and tied tightly with a pursestring suture. This was then placed back into the abdomen. The robot was then docked to the patient's left side once again oriented towards the left upper quadrant. The splenic flexure was  mobilized using the robotic vessel sealer and blunt dissection. After the entire splenic flexure was mobilized the colon reached down into the pelvis without difficulty or tension. An anastomosis was created using the EEA stapler through the distal rectal stump. There was no leak when tested with insufflation underwater. The abdomen was irrigated with normal saline.  I then had the circulating nurse insufflated the bladder with normal saline tinted with methylene blue. There was no leak noted but there was an area of weakness where the wall of the bladder was bulging through. I placed a 2-0 V lock suture through the area to close the peritoneum over this.  We then switched to clean gowns, gloves, instruments and drapes. The peritoneum of the extraction site was closed with a running 2-0 Vicryl suture.  The fascia was closed using 0 Novafil interrupted sutures. The subcutaneous tissue was reapproximated using interrupted 2-0 Vicryl sutures. Skin was closed with a running 4-0 Vicryl subcuticular suture. The port sites were also closed with a 4-0 Vicryl suture. Skin glue was placed on the port sites and a sterile dressing was placed over the extraction site. The patient was awakened from  anesthesia and sent to the postanesthesia care unit in stable condition. All counts were correct per operating room staff.

## 2016-10-03 NOTE — Brief Op Note (Signed)
10/03/2016  8:04 AM  PATIENT:  Alvin Benton  65 y.o. male  PRE-OPERATIVE DIAGNOSIS:  COLOVESICAL FISTULA  POST-OPERATIVE DIAGNOSIS:  COLOVESICAL FISTULA  PROCEDURE:  Procedure(s):  CYSTOSCOPY WITH  LEFT STENT PLACEMENT (Left) INSTILLATION OF FIREFLY  SURGEON:  Surgeon(s) and Role:      * Bjorn PippinJohn Sofiah Lyne, MD - Primary  PHYSICIAN ASSISTANT:   ASSISTANTS: none   ANESTHESIA:   general  EBL:  No intake/output data recorded.  BLOOD ADMINISTERED:none  DRAINS: Urinary Catheter (Foley) and 745fr left ureteral catheter   LOCAL MEDICATIONS USED:  NONE  SPECIMEN:  No Specimen  DISPOSITION OF SPECIMEN:  NA  COUNTS:  YES  TOURNIQUET:  * No tourniquets in log *  DICTATION: .Other Dictation: Dictation Number [id number]  PLAN OF CARE: Admit to inpatient   PATIENT DISPOSITION:  PACU - hemodynamically stable.   Delay start of Pharmacological VTE agent (>24hrs) due to surgical blood loss or risk of bleeding: not applicable

## 2016-10-04 ENCOUNTER — Encounter (HOSPITAL_COMMUNITY): Payer: Self-pay | Admitting: General Surgery

## 2016-10-04 LAB — CBC
HCT: 43.6 % (ref 39.0–52.0)
HEMOGLOBIN: 13.6 g/dL (ref 13.0–17.0)
MCH: 31.1 pg (ref 26.0–34.0)
MCHC: 31.2 g/dL (ref 30.0–36.0)
MCV: 99.8 fL (ref 78.0–100.0)
Platelets: 373 10*3/uL (ref 150–400)
RBC: 4.37 MIL/uL (ref 4.22–5.81)
RDW: 14.2 % (ref 11.5–15.5)
WBC: 10.8 10*3/uL — ABNORMAL HIGH (ref 4.0–10.5)

## 2016-10-04 LAB — BASIC METABOLIC PANEL
ANION GAP: 7 (ref 5–15)
BUN: 14 mg/dL (ref 6–20)
CALCIUM: 8.3 mg/dL — AB (ref 8.9–10.3)
CO2: 24 mmol/L (ref 22–32)
Chloride: 106 mmol/L (ref 101–111)
Creatinine, Ser: 1.23 mg/dL (ref 0.61–1.24)
GFR calc Af Amer: >60 mL/min (ref >60)
GLUCOSE: 122 mg/dL — AB (ref 65–99)
Potassium: 4.4 mmol/L (ref 3.5–5.1)
Sodium: 137 mmol/L (ref 135–145)

## 2016-10-04 MED ORDER — SIMETHICONE 80 MG PO CHEW
80.0000 mg | CHEWABLE_TABLET | Freq: Four times a day (QID) | ORAL | Status: DC | PRN
  Administered 2016-10-04 – 2016-10-07 (×4): 80 mg via ORAL
  Filled 2016-10-04 (×6): qty 1

## 2016-10-04 NOTE — Op Note (Signed)
NAME:  Alvin Benton, Alvin Benton                     ACCOUNT NO.:  MEDICAL RECORD NO.:  [id number]  LOCATION:                                 FACILITY:  PHYSICIAN:  Excell SeltzerJohn J. Annabell HowellsWrenn, M.D.    DATE OF BIRTH:  Mar 02, 1951  DATE OF PROCEDURE:  10/03/2016 DATE OF DISCHARGE:                              OPERATIVE REPORT   PROCEDURE:  Cystoscopy with insertion of left ureteral catheter and instillation of Firefly.  PREOPERATIVE DIAGNOSIS:  Colovesical fistula.  POSTOPERATIVE DIAGNOSIS:  Colovesical fistula.  SURGEON:  Bjorn PippinJohn Kourtnie Sachs, M.D.  ANESTHESIA:  General.  SPECIMEN:  None.  DRAINS:  A 16-French Foley catheter and 5-French left ureteral catheter.  BLOOD LOSS:  None.  COMPLICATIONS:  None.  INDICATIONS:  [name] is a 65 year old white male with a colovesical fistula following diverticular abscess.  He is to undergo colon resection today and it was felt that ureteral stenting and installation of Firefly would be helpful in identifying the ureter.  FINDINGS AND PROCEDURE:  He had been given cefepime per Dr. Maisie Fushomas' orders.  He was taken to the operating room where general anesthetic was induced.  He was placed in lithotomy position.  He was prepped with Betadine solution and draped in usual sterile fashion.  Cystoscopy was performed using a 23-French scope and 30-degree lens. Examination revealed a normal urethra.  The external sphincter was intact.  The prostatic urethra was approximately 3 cm in length with bilobar hyperplasia with some obstruction.  Examination of bladder revealed moderate trabeculation.  There was approximately 3-4 cm area of mild inflammatory edema on the left posterior lateral wall.  In the center of this, there was a defect consistent with the bladder, termed as fistula tract.  The ureteral orifices were in their normal anatomic position.  The left ureteral orifice was cannulated with a 5-French open-end catheter.  Initially, it would not pass the intramural ureter,  so a Sensor wire was used to aid placement.  It was then passed without difficulty to the kidney based on the measuring marks.  Fluoroscopy was not required.  At this point, the wire was removed and the ureter and collecting system was instilled with 7 mL of Firefly solution.  The cystoscope was then removed leaving the ureteral catheter in place. A 16-French Foley catheter was placed alongside this.  The balloon was filled with 10 mL sterile fluid.  The ureteral catheter and Foley replaced were placed to a LovelockGoldberg device, which was connected to a drainage bag, and the ureteral catheter was secured to the Foley with 0 silk ties.  At this point, the patient was taken down from lithotomy position and the procedure was turned over to Dr. Maisie Fushomas for her portion.  There were no complications during this procedure.     Excell SeltzerJohn J. Annabell HowellsWrenn, M.D.     JJW/MEDQ  D:  10/03/2016  T:  10/04/2016  Job:  [id number]

## 2016-10-04 NOTE — Progress Notes (Signed)
1 Day Post-Op robotic sigmoidectomy Subjective: Pt having bowel function, no nausea  Objective: Vital signs in last 24 hours: Temp:  [97.6 F (36.4 C)-98.6 F (37 C)] 98.3 F (36.8 C) (12/08 0959) Pulse Rate:  [77-91] 80 (12/08 0959) Resp:  [13-17] 16 (12/08 0959) BP: (116-174)/(66-87) 125/66 (12/08 0959) SpO2:  [97 %-100 %] 97 % (12/08 0959)   Intake/Output from previous day: 12/07 0701 - 12/08 0700 In: 4186.7 [P.O.:120; I.V.:4016.7; IV Piggyback:50] Out: 670 [Urine:620; Blood:50] Intake/Output this shift: Total I/O In: -  Out: 225 [Urine:225]   General appearance: alert and cooperative GI: soft, non-distended  Incision: no significant drainage  Lab Results:   Recent Labs  10/03/16 0705 10/04/16 0404  WBC  --  10.8*  HGB 17.3* 13.6  HCT 51.0 43.6  PLT  --  373   BMET  Recent Labs  10/03/16 0705 10/04/16 0404  NA 141 137  K 3.9 4.4  CL  --  106  CO2  --  24  GLUCOSE 103* 122*  BUN  --  14  CREATININE  --  1.23  CALCIUM  --  8.3*   PT/INR No results for input(s): LABPROT, INR in the last 72 hours. ABG No results for input(s): PHART, HCO3 in the last 72 hours.  Invalid input(s): PCO2, PO2  MEDS, Scheduled . alvimopan  12 mg Oral BID  . cefoTEtan (CEFOTAN) IV  2 g Intravenous Once  . enoxaparin (LOVENOX) injection  40 mg Subcutaneous Q24H  . irbesartan  150 mg Oral Daily  . LORazepam  0-4 mg Intravenous Q6H   Followed by  . [START ON 10/05/2016] LORazepam  0-4 mg Intravenous Q12H    Studies/Results: No results found.  Assessment: s/p Procedure(s): ROBOT ASSISTED SIGMOIDECTOMY, MOBILIZATION OF SPLENIC FLEXURE CYSTOSCOPY WITH  LEFT STENT PLACEMENT PROCTOSCOPY Patient Active Problem List   Diagnosis Date Noted  . Alcohol withdrawal (HCC) 06/04/2016  . Volume depletion 06/04/2016  . Hypokalemia 06/04/2016  . Diverticulitis of intestine with perforation without bleeding 06/04/2016  . Essential hypertension   . Hypomagnesemia   . Ileus  (HCC) 05/05/2016  . Systolic murmur 05/05/2016  . Anxiety state 04/19/2016  . 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 Hypoxia 04/05/2016  . Atrial fibrillation (HCC) 04/05/2016  . Diverticulitis of large intestine with perforation and abscess 03/14/2016  . Chronic constipation 03/08/2016  . Alcohol abuse 02/15/2016  . Tobacco use disorder 07/17/2015  . Allergic rhinitis 07/17/2015  . Reactive airway disease with wheezing 07/17/2015  . Overweight (BMI 25.0-29.9) 07/17/2015  . Hand pain, left 01/12/2014  . LLQ pain-chronic, intermittent and episodic 09/17/2013    Expected post op course  Plan: Advance diet to fulls Ambulate D/c foley in AM   LOS: 1 day     .Vanita PandaAlicia C Cane Dubray, MD [facility], [place] [phone]   10/04/2016 10:49 AM

## 2016-10-04 NOTE — Evaluation (Signed)
Physical Therapy Evaluation Patient Details Name: Alvin Benton MRN: [id number] DOB: 02-02-1951 Today's Date: 10/04/2016   History of Present Illness  Alvin Benton is a 65 year old white male with a colovesical fistula; s/p Cystoscopy with insertion of left ureteral catheter and installation of firefly; PMHx: diverticulitis, HTN, ETOH   Clinical Impression  Patient evaluated by Physical Therapy with no further acute PT needs identified. All education has been completed and the patient has no further questions.  See below for any follow-up Physical Therapy or equipment needs. PT is signing off. Thank you for this referral. Encouraged pt to amb with nursing staff; no LOB, no AD --amb 380'--feels he is at or near his baseline for mobility/amb; if pt needs should change or status should worsen please re-consult PT; Thank you    Follow Up Recommendations No PT follow up    Equipment Recommendations  None recommended by PT    Recommendations for Other Services       Precautions / Restrictions        Mobility  Bed Mobility Overal bed mobility: Modified Independent Bed Mobility: Sidelying to Sit   Sidelying to sit: HOB elevated       General bed mobility comments: HOB elevated 35*, no physical assist, slightly incr time  Transfers Overall transfer level: Modified independent   Transfers: Sit to/from Stand Sit to Stand: Modified independent (Device/Increase time)            Ambulation/Gait Ambulation/Gait assistance: Supervision Ambulation Distance (Feet): 380 Feet Assistive device: None Gait Pattern/deviations: Wide base of support;Decreased stride length     General Gait Details: no overt LOB, mild hesitancy at times, limited trunk/pelvic rotation due abd soreness;  pt feels his gait is at or near baseline; 1 standing rest d/t pain  Stairs            Wheelchair Mobility    Modified Rankin (Stroke Patients Only)       Balance     Sitting balance-Leahy Scale:  Normal       Standing balance-Leahy Scale: Good               High level balance activites: Direction changes;Turns;Head turns High Level Balance Comments: no LOB; see gait section for mild deviations             Pertinent Vitals/Pain Pain Assessment: 0-10 Pain Score: 6  Pain Location: right flank Pain Descriptors / Indicators: Sharp Pain Intervention(s): Limited activity within patient's tolerance;Monitored during session;Premedicated before session    Home Living Family/patient expects to be discharged to:: Private residence Living Arrangements: Alone Available Help at Discharge: Friend(s) Type of Home: Apartment Home Access: Stairs to enter Entrance Stairs-Rails: Right Entrance Stairs-Number of Steps: 12 Home Layout: One level Home Equipment: Cane - single point      Prior Function Level of Independence: Independent         Comments: 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 reports he walks a lot at work      Higher education careers adviserHand Dominance        Extremity/Trunk Assessment   Upper Extremity Assessment: Overall WFL for tasks assessed           Lower Extremity Assessment: Overall WFL for tasks assessed         Communication   Communication: No difficulties  Cognition Arousal/Alertness: Awake/alert Behavior During Therapy: WFL for tasks assessed/performed Overall Cognitive Status: Within Functional Limits for tasks assessed  General Comments      Exercises     Assessment/Plan    PT Assessment Patent does not need any further PT services  PT Problem List            PT Treatment Interventions      PT Goals (Current goals can be found in the Care Plan section)  Acute Rehab PT Goals Patient Stated Goal: be able to eat again PT Goal Formulation: All assessment and education complete, DC therapy Time For Goal Achievement: 10/04/16    Frequency     Barriers to discharge        Co-evaluation               End  of Session   Activity Tolerance: Patient tolerated treatment well Patient left: in chair;with chair alarm set;with call bell/phone within reach Nurse Communication: Mobility status         Time: [id number] PT Time Calculation (min) (ACUTE ONLY): 12 min   Charges:   PT Evaluation $PT Eval Low Complexity: 1 Procedure     PT G Codes:        Alvin Benton 10/04/2016, 12:07 PM

## 2016-10-04 NOTE — Progress Notes (Signed)
Chaplain responding to consult re: advance directives.   Spoke with Alvin Benton about his ProofreaderAdvance Directive.  He reports he has paperwork filled out, but has not gotten notarized.  Offered to notarize for him in hospital.  He states he will wait until later.  Informed how to contact spiritual care through nursing when he is ready to notarize forms.     [name], [name] Wayne MDiv

## 2016-10-05 LAB — BASIC METABOLIC PANEL
ANION GAP: 7 (ref 5–15)
BUN: 10 mg/dL (ref 6–20)
CHLORIDE: 104 mmol/L (ref 101–111)
CO2: 24 mmol/L (ref 22–32)
Calcium: 8.2 mg/dL — ABNORMAL LOW (ref 8.9–10.3)
Creatinine, Ser: 0.8 mg/dL (ref 0.61–1.24)
GFR calc Af Amer: >60 mL/min (ref >60)
GFR calc non Af Amer: >60 mL/min (ref >60)
Glucose, Bld: 104 mg/dL — ABNORMAL HIGH (ref 65–99)
POTASSIUM: 4.6 mmol/L (ref 3.5–5.1)
SODIUM: 135 mmol/L (ref 135–145)

## 2016-10-05 LAB — CBC
HEMATOCRIT: 42 % (ref 39.0–52.0)
HEMOGLOBIN: 13.1 g/dL (ref 13.0–17.0)
MCH: 31.6 pg (ref 26.0–34.0)
MCHC: 31.2 g/dL (ref 30.0–36.0)
MCV: 101.2 fL — AB (ref 78.0–100.0)
Platelets: 284 10*3/uL (ref 150–400)
RBC: 4.15 MIL/uL — AB (ref 4.22–5.81)
RDW: 14.5 % (ref 11.5–15.5)
WBC: 11.1 10*3/uL — AB (ref 4.0–10.5)

## 2016-10-05 MED ORDER — OXYCODONE HCL 5 MG PO TABS
5.0000 mg | ORAL_TABLET | ORAL | Status: DC | PRN
  Administered 2016-10-05 (×2): 10 mg via ORAL
  Administered 2016-10-06: 5 mg via ORAL
  Administered 2016-10-06 – 2016-10-07 (×6): 10 mg via ORAL
  Filled 2016-10-05 (×9): qty 2

## 2016-10-05 MED ORDER — ACETAMINOPHEN 325 MG PO TABS: 325.0000 mg | ORAL_TABLET | Freq: Four times a day (QID) | ORAL | Status: DC | PRN

## 2016-10-05 MED ORDER — KETOROLAC TROMETHAMINE 15 MG/ML IJ SOLN
15.0000 mg | Freq: Four times a day (QID) | INTRAMUSCULAR | Status: DC
  Administered 2016-10-05 – 2016-10-06 (×4): 15 mg via INTRAVENOUS
  Filled 2016-10-05 (×4): qty 1

## 2016-10-05 NOTE — Progress Notes (Signed)
2 Days Post-Op robotic sigmoidectomy Subjective: Pt having bowel function, no nausea, feels more sore today  Objective: Vital signs in last 24 hours: Temp:  [98.1 F (36.7 C)-98.6 F (37 C)] 98.6 F (37 C) (12/09 0522) Pulse Rate:  [80-94] 80 (12/09 0522) Resp:  [16] 16 (12/09 0522) BP: (135-140)/(68-78) 135/78 (12/09 0522) SpO2:  [95 %-97 %] 96 % (12/09 0522)   Intake/Output from previous day: 12/08 0701 - 12/09 0700 In: 2740 [P.O.:300; I.V.:2440] Out: 3025 [Urine:3025] Intake/Output this shift: Total I/O In: 0  Out: 175 [Urine:175]   General appearance: alert and cooperative GI: soft, non-distended  Incision: no significant drainage  Lab Results:   Recent Labs  10/04/16 0404 10/05/16 0452  WBC 10.8* 11.1*  HGB 13.6 13.1  HCT 43.6 42.0  PLT 373 284   BMET  Recent Labs  10/04/16 0404 10/05/16 0452  NA 137 135  K 4.4 4.6  CL 106 104  CO2 24 24  GLUCOSE 122* 104*  BUN 14 10  CREATININE 1.23 0.80  CALCIUM 8.3* 8.2*   PT/INR No results for input(s): LABPROT, INR in the last 72 hours. ABG No results for input(s): PHART, HCO3 in the last 72 hours.  Invalid input(s): PCO2, PO2  MEDS, Scheduled . alvimopan  12 mg Oral BID  . cefoTEtan (CEFOTAN) IV  2 g Intravenous Once  . enoxaparin (LOVENOX) injection  40 mg Subcutaneous Q24H  . irbesartan  150 mg Oral Daily  . LORazepam  0-4 mg Intravenous Q6H   Followed by  . LORazepam  0-4 mg Intravenous Q12H    Studies/Results: No results found.  Assessment: s/p Procedure(s): ROBOT ASSISTED SIGMOIDECTOMY, MOBILIZATION OF SPLENIC FLEXURE CYSTOSCOPY WITH  LEFT STENT PLACEMENT PROCTOSCOPY Patient Active Problem List   Diagnosis Date Noted  . Alcohol withdrawal (HCC) 06/04/2016  . Volume depletion 06/04/2016  . Hypokalemia 06/04/2016  . Diverticulitis of intestine with perforation without bleeding 06/04/2016  . Essential hypertension   . Hypomagnesemia   . Ileus (HCC) 05/05/2016  . Systolic murmur  45/40/981107/06/2016  . Anxiety state 04/19/2016  . 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 Hypoxia 04/05/2016  . Atrial fibrillation (HCC) 04/05/2016  . Diverticulitis of large intestine with perforation and abscess 03/14/2016  . Chronic constipation 03/08/2016  . Alcohol abuse 02/15/2016  . Tobacco use disorder 07/17/2015  . Allergic rhinitis 07/17/2015  . Reactive airway disease with wheezing 07/17/2015  . Overweight (BMI 25.0-29.9) 07/17/2015  . Hand pain, left 01/12/2014  . LLQ pain-chronic, intermittent and episodic 09/17/2013    Expected post op course  Plan: Advance diet to soft foods Ambulate PO pain meds Toradol for added pain control   LOS: 2 days     .Vanita PandaAlicia C Paitlyn Mcclatchey, MD [facility], [place] [phone]   10/05/2016 11:11 AM

## 2016-10-06 LAB — CBC
HCT: 39.8 % (ref 39.0–52.0)
Hemoglobin: 12.9 g/dL — ABNORMAL LOW (ref 13.0–17.0)
MCH: 31.5 pg (ref 26.0–34.0)
MCHC: 32.4 g/dL (ref 30.0–36.0)
MCV: 97.3 fL (ref 78.0–100.0)
Platelets: 349 10*3/uL (ref 150–400)
RBC: 4.09 MIL/uL — ABNORMAL LOW (ref 4.22–5.81)
RDW: 13.7 % (ref 11.5–15.5)
WBC: 9.7 10*3/uL (ref 4.0–10.5)

## 2016-10-06 LAB — BASIC METABOLIC PANEL
ANION GAP: 8 (ref 5–15)
BUN: 10 mg/dL (ref 6–20)
CALCIUM: 8.6 mg/dL — AB (ref 8.9–10.3)
CO2: 21 mmol/L — ABNORMAL LOW (ref 22–32)
Chloride: 105 mmol/L (ref 101–111)
Creatinine, Ser: 0.79 mg/dL (ref 0.61–1.24)
GFR calc Af Amer: >60 mL/min (ref >60)
GLUCOSE: 91 mg/dL (ref 65–99)
Potassium: 4.1 mmol/L (ref 3.5–5.1)
SODIUM: 134 mmol/L — AB (ref 135–145)

## 2016-10-06 MED ORDER — IBUPROFEN 200 MG PO TABS
400.0000 mg | ORAL_TABLET | Freq: Four times a day (QID) | ORAL | Status: DC | PRN
  Administered 2016-10-06: 600 mg via ORAL
  Filled 2016-10-06: qty 3

## 2016-10-06 NOTE — Progress Notes (Signed)
3 Days Post-Op robotic sigmoidectomy Subjective: Pt having bowel function, no nausea, pain controlled  Objective: Vital signs in last 24 hours: Temp:  [98.3 F (36.8 C)-98.6 F (37 C)] 98.6 F (37 C) (12/10 0506) Pulse Rate:  [72-85] 72 (12/10 0506) Resp:  [15-16] 16 (12/10 0506) BP: (168-209)/(71-90) 177/71 (12/10 0506) SpO2:  [97 %-99 %] 98 % (12/10 0506)   Intake/Output from previous day: 12/09 0701 - 12/10 0700 In: 1696.5 [P.O.:960; I.V.:736.5] Out: 525 [Urine:525] Intake/Output this shift: Total I/O In: 240 [P.O.:240] Out: 100 [Urine:100]   General appearance: alert and cooperative GI: soft, non-distended  Incision: no significant drainage  Lab Results:   Recent Labs  10/05/16 0452 10/06/16 0449  WBC 11.1* 9.7  HGB 13.1 12.9*  HCT 42.0 39.8  PLT 284 349   BMET  Recent Labs  10/05/16 0452 10/06/16 0449  NA 135 134*  K 4.6 4.1  CL 104 105  CO2 24 21*  GLUCOSE 104* 91  BUN 10 10  CREATININE 0.80 0.79  CALCIUM 8.2* 8.6*   PT/INR No results for input(s): LABPROT, INR in the last 72 hours. ABG No results for input(s): PHART, HCO3 in the last 72 hours.  Invalid input(s): PCO2, PO2  MEDS, Scheduled . enoxaparin (LOVENOX) injection  40 mg Subcutaneous Q24H  . irbesartan  150 mg Oral Daily  . LORazepam  0-4 mg Intravenous Q12H    Studies/Results: No results found.  Assessment: s/p Procedure(s): ROBOT ASSISTED SIGMOIDECTOMY, MOBILIZATION OF SPLENIC FLEXURE CYSTOSCOPY WITH  LEFT STENT PLACEMENT PROCTOSCOPY Patient Active Problem List   Diagnosis Date Noted  . Alcohol withdrawal (HCC) 06/04/2016  . Volume depletion 06/04/2016  . Hypokalemia 06/04/2016  . Diverticulitis of intestine with perforation without bleeding 06/04/2016  . Essential hypertension   . Hypomagnesemia   . Ileus (HCC) 05/05/2016  . Systolic murmur 05/05/2016  . Anxiety state 04/19/2016  . 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 Hypoxia 04/05/2016  . Atrial fibrillation  (HCC) 04/05/2016  . Diverticulitis of large intestine with perforation and abscess 03/14/2016  . Chronic constipation 03/08/2016  . Alcohol abuse 02/15/2016  . Tobacco use disorder 07/17/2015  . Allergic rhinitis 07/17/2015  . Reactive airway disease with wheezing 07/17/2015  . Overweight (BMI 25.0-29.9) 07/17/2015  . Hand pain, left 01/12/2014  . LLQ pain-chronic, intermittent and episodic 09/17/2013    Expected post op course  Plan: Advance diet to soft foods Ambulate PO pain meds Ibuprofen for added pain control Anticipate d/c tom    LOS: 3 days     .Vanita PandaAlicia C Charell Faulk, MD [facility], [place] [phone]   10/06/2016 10:00 AM

## 2016-10-07 MED ORDER — OXYCODONE HCL 5 MG PO TABS: 5.0000 mg | ORAL_TABLET | ORAL | 0 refills | Status: DC | PRN

## 2016-10-07 MED ORDER — IBUPROFEN 400 MG PO TABS: 400.0000 mg | ORAL_TABLET | Freq: Four times a day (QID) | ORAL | 0 refills | Status: DC | PRN

## 2016-10-07 MED ORDER — ACETAMINOPHEN 325 MG PO TABS: 325.0000 mg | ORAL_TABLET | Freq: Four times a day (QID) | ORAL | Status: DC | PRN

## 2016-10-07 NOTE — Discharge Instructions (Signed)

## 2016-10-07 NOTE — Discharge Summary (Signed)
Physician Discharge Summary  Patient ID: Alvin Benton Innocent MRN: [id number] DOB/AGE: January 07, 1951 65 y.o.  Admit date: 10/03/2016 Discharge date: 10/07/2016  Admission Diagnoses: Colovesical fistula  Discharge Diagnoses:  Active Problems:   Colovesical fistula   Discharged Condition: good  Hospital Course: Pt admitted after surgery.  His diet was advanced as tolerated.  His foley was removed on POD 2.  By Baldwin CrownPOD4 he was tolerating a diet and PO pain meds.  He was felt to be in stable condition for discharge to home.    Consults: None  Significant Diagnostic Studies: labs: cbc, chemistry  Treatments: IV hydration, analgesia: oxycodone and surgery: robotic sigmoidectomy  Discharge Exam: Blood pressure (!) 188/85, pulse 71, temperature 98.3 F (36.8 C), temperature source Oral, resp. rate 16, height 5\' 9"  (1.753 m), weight 71.2 kg (157 lb), SpO2 99 %. General appearance: alert and cooperative GI: normal findings: soft, non-tender Incision/Wound: clean, dry, intact  Disposition: 01-Home or Self Care     Medication List    STOP taking these medications   amoxicillin-clavulanate 875-125 MG tablet Commonly known as:  AUGMENTIN     TAKE these medications   acetaminophen 325 MG tablet Commonly known as:  TYLENOL Take 1-2 tablets (325-650 mg total) by mouth every 6 (six) hours as needed for fever, headache, mild pain or moderate pain.   albuterol 108 (90 Base) MCG/ACT inhaler Commonly known as:  PROVENTIL HFA;VENTOLIN HFA Inhale 2 puffs into the lungs every 6 (six) hours as needed for wheezing or shortness of breath.   GAS RELIEF PO Take 1 tablet by mouth every 6 (six) hours as needed for flatulence.   ibuprofen 400 MG tablet Commonly known as:  ADVIL,MOTRIN Take 1-1.5 tablets (400-600 mg total) by mouth every 6 (six) hours as needed for fever or headache.   multivitamin with minerals Tabs tablet Take 1 tablet by mouth daily.   oxyCODONE 5 MG immediate release tablet Commonly  known as:  Oxy IR/ROXICODONE Take 1-2 tablets (5-10 mg total) by mouth every 4 (four) hours as needed for moderate pain, severe pain or breakthrough pain.   potassium chloride SA 20 MEQ tablet Commonly known as:  K-DUR,KLOR-CON Take 2 tablets (40 mEq total) by mouth 2 (two) times daily.   valsartan 160 MG tablet Commonly known as:  DIOVAN Take 160 mg by mouth daily.      Follow-up Information    Vanita PandaHOMAS, Cristyn Crossno C., MD. Schedule an appointment as soon as possible for a visit in 2 week(s).   Specialty:  General Surgery Contact information: [address] [place] [address] [phone]           Signed: Vanita PandaHOMAS, Tondalaya Perren C. 10/07/2016, 8:51 AM

## 2016-10-07 NOTE — Progress Notes (Signed)
Patient verbalized understanding of discharge instructions. Patient prescription is given to patient. Patient is stable at discharge.

## 2016-11-19 ENCOUNTER — Other Ambulatory Visit: Payer: Self-pay | Admitting: Family Medicine

## 2017-01-21 ENCOUNTER — Other Ambulatory Visit: Payer: Self-pay | Admitting: Family Medicine

## 2017-02-20 ENCOUNTER — Emergency Department (HOSPITAL_COMMUNITY): Payer: Managed Care, Other (non HMO)

## 2017-02-20 ENCOUNTER — Inpatient Hospital Stay (HOSPITAL_COMMUNITY)
Admission: EM | Admit: 2017-02-20 | Discharge: 2017-02-23 | DRG: 641 | Disposition: A | Discharge status: Home / Self Care | Payer: Managed Care, Other (non HMO) | Attending: Family Medicine | Admitting: Family Medicine

## 2017-02-20 ENCOUNTER — Encounter (HOSPITAL_COMMUNITY): Payer: Self-pay | Admitting: Emergency Medicine

## 2017-02-20 ENCOUNTER — Observation Stay (HOSPITAL_COMMUNITY): Payer: Managed Care, Other (non HMO)

## 2017-02-20 DIAGNOSIS — F17210 Nicotine dependence, cigarettes, uncomplicated: Secondary | ICD-10-CM | POA: Diagnosis present

## 2017-02-20 DIAGNOSIS — E860 Dehydration: Secondary | ICD-10-CM | POA: Diagnosis present

## 2017-02-20 DIAGNOSIS — Z9889 Other specified postprocedural states: Secondary | ICD-10-CM

## 2017-02-20 DIAGNOSIS — Z79899 Other long term (current) drug therapy: Secondary | ICD-10-CM

## 2017-02-20 DIAGNOSIS — E871 Hypo-osmolality and hyponatremia: Principal | ICD-10-CM

## 2017-02-20 DIAGNOSIS — Z823 Family history of stroke: Secondary | ICD-10-CM

## 2017-02-20 DIAGNOSIS — Z87440 Personal history of urinary (tract) infections: Secondary | ICD-10-CM

## 2017-02-20 DIAGNOSIS — I4891 Unspecified atrial fibrillation: Secondary | ICD-10-CM | POA: Diagnosis present

## 2017-02-20 DIAGNOSIS — K219 Gastro-esophageal reflux disease without esophagitis: Secondary | ICD-10-CM | POA: Diagnosis present

## 2017-02-20 DIAGNOSIS — R296 Repeated falls: Secondary | ICD-10-CM | POA: Diagnosis present

## 2017-02-20 DIAGNOSIS — F1010 Alcohol abuse, uncomplicated: Secondary | ICD-10-CM | POA: Diagnosis not present

## 2017-02-20 DIAGNOSIS — Z9049 Acquired absence of other specified parts of digestive tract: Secondary | ICD-10-CM

## 2017-02-20 DIAGNOSIS — E876 Hypokalemia: Secondary | ICD-10-CM | POA: Diagnosis present

## 2017-02-20 DIAGNOSIS — I10 Essential (primary) hypertension: Secondary | ICD-10-CM | POA: Diagnosis present

## 2017-02-20 DIAGNOSIS — F17200 Nicotine dependence, unspecified, uncomplicated: Secondary | ICD-10-CM | POA: Diagnosis present

## 2017-02-20 DIAGNOSIS — K580 Irritable bowel syndrome with diarrhea: Secondary | ICD-10-CM | POA: Diagnosis present

## 2017-02-20 LAB — URINALYSIS, ROUTINE W REFLEX MICROSCOPIC
Bilirubin Urine: NEGATIVE
GLUCOSE, UA: NEGATIVE mg/dL
Hgb urine dipstick: NEGATIVE
Ketones, ur: NEGATIVE mg/dL
LEUKOCYTES UA: NEGATIVE
Nitrite: NEGATIVE
PH: 5 (ref 5.0–8.0)
PROTEIN: NEGATIVE mg/dL
Specific Gravity, Urine: 1.009 (ref 1.005–1.030)

## 2017-02-20 LAB — BASIC METABOLIC PANEL
ANION GAP: 21 — AB (ref 5–15)
ANION GAP: 13 (ref 5–15)
BUN: 16 mg/dL (ref 6–20)
BUN: 14 mg/dL (ref 6–20)
CHLORIDE: 67 mmol/L — AB (ref 101–111)
CO2: 25 mmol/L (ref 22–32)
CO2: 27 mmol/L (ref 22–32)
Calcium: 9.5 mg/dL (ref 8.9–10.3)
Calcium: 8.1 mg/dL — ABNORMAL LOW (ref 8.9–10.3)
Chloride: 78 mmol/L — ABNORMAL LOW (ref 101–111)
Creatinine, Ser: 1.49 mg/dL — ABNORMAL HIGH (ref 0.61–1.24)
Creatinine, Ser: 1.25 mg/dL — ABNORMAL HIGH (ref 0.61–1.24)
GFR calc non Af Amer: 48 mL/min — ABNORMAL LOW (ref >60)
GFR, EST AFRICAN AMERICAN: 55 mL/min — AB (ref >60)
GFR, EST NON AFRICAN AMERICAN: 59 mL/min — AB (ref >60)
GLUCOSE: 91 mg/dL (ref 65–99)
Glucose, Bld: 103 mg/dL — ABNORMAL HIGH (ref 65–99)
POTASSIUM: 2.9 mmol/L — AB (ref 3.5–5.1)
POTASSIUM: 2.7 mmol/L — AB (ref 3.5–5.1)
SODIUM: 113 mmol/L — AB (ref 135–145)
SODIUM: 118 mmol/L — AB (ref 135–145)

## 2017-02-20 LAB — CBC
HEMATOCRIT: 44.6 % (ref 39.0–52.0)
HEMATOCRIT: 36.8 % — AB (ref 39.0–52.0)
HEMOGLOBIN: 17.3 g/dL — AB (ref 13.0–17.0)
HEMOGLOBIN: 13.9 g/dL (ref 13.0–17.0)
MCH: 33 pg (ref 26.0–34.0)
MCH: 32.4 pg (ref 26.0–34.0)
MCHC: 38.8 g/dL — ABNORMAL HIGH (ref 30.0–36.0)
MCHC: 37.8 g/dL — AB (ref 30.0–36.0)
MCV: 85 fL (ref 78.0–100.0)
MCV: 85.8 fL (ref 78.0–100.0)
Platelets: 549 10*3/uL — ABNORMAL HIGH (ref 150–400)
Platelets: 393 10*3/uL (ref 150–400)
RBC: 5.25 MIL/uL (ref 4.22–5.81)
RBC: 4.29 MIL/uL (ref 4.22–5.81)
RDW: 12.3 % (ref 11.5–15.5)
RDW: 12.1 % (ref 11.5–15.5)
WBC: 14.9 10*3/uL — AB (ref 4.0–10.5)
WBC: 10.9 10*3/uL — ABNORMAL HIGH (ref 4.0–10.5)

## 2017-02-20 LAB — HEPATIC FUNCTION PANEL
ALT: 36 U/L (ref 17–63)
AST: 76 U/L — AB (ref 15–41)
Albumin: 3.9 g/dL (ref 3.5–5.0)
Alkaline Phosphatase: 57 U/L (ref 38–126)
BILIRUBIN TOTAL: 1.5 mg/dL — AB (ref 0.3–1.2)
Bilirubin, Direct: 0.4 mg/dL (ref 0.1–0.5)
Indirect Bilirubin: 1.1 mg/dL — ABNORMAL HIGH (ref 0.3–0.9)
Total Protein: 7 g/dL (ref 6.5–8.1)

## 2017-02-20 LAB — PHOSPHORUS: PHOSPHORUS: 1.9 mg/dL — AB (ref 2.5–4.6)

## 2017-02-20 LAB — I-STAT CG4 LACTIC ACID, ED
LACTIC ACID, VENOUS: 3.05 mmol/L — AB (ref 0.5–1.9)
Lactic Acid, Venous: 5.59 mmol/L (ref 0.5–1.9)

## 2017-02-20 LAB — MAGNESIUM: Magnesium: 1.5 mg/dL — ABNORMAL LOW (ref 1.7–2.4)

## 2017-02-20 LAB — CBG MONITORING, ED: Glucose-Capillary: 105 mg/dL — ABNORMAL HIGH (ref 65–99)

## 2017-02-20 LAB — LIPASE, BLOOD: LIPASE: 29 U/L (ref 11–51)

## 2017-02-20 MED ORDER — SODIUM CHLORIDE 0.9 % IV SOLN
30.0000 meq | Freq: Once | INTRAVENOUS | Status: AC
  Administered 2017-02-20: 30 meq via INTRAVENOUS
  Filled 2017-02-20: qty 15

## 2017-02-20 MED ORDER — LORAZEPAM 1 MG PO TABS
1.0000 mg | ORAL_TABLET | Freq: Four times a day (QID) | ORAL | Status: DC | PRN
  Administered 2017-02-21 – 2017-02-23 (×7): 1 mg via ORAL
  Filled 2017-02-20 (×7): qty 1

## 2017-02-20 MED ORDER — ONDANSETRON 4 MG PO TBDP
ORAL_TABLET | ORAL | Status: AC
  Filled 2017-02-20: qty 1

## 2017-02-20 MED ORDER — LORAZEPAM 2 MG/ML IJ SOLN: 1.0000 mg | Freq: Four times a day (QID) | INTRAMUSCULAR | Status: DC | PRN

## 2017-02-20 MED ORDER — ACETAMINOPHEN 325 MG PO TABS: 325.0000 mg | ORAL_TABLET | Freq: Four times a day (QID) | ORAL | Status: DC | PRN

## 2017-02-20 MED ORDER — SODIUM CHLORIDE 0.9 % IV SOLN: 30.0000 meq | Freq: Once | INTRAVENOUS | Status: DC

## 2017-02-20 MED ORDER — ADULT MULTIVITAMIN W/MINERALS CH: 1.0000 | ORAL_TABLET | Freq: Every day | ORAL | Status: DC

## 2017-02-20 MED ORDER — FOLIC ACID 1 MG PO TABS
1.0000 mg | ORAL_TABLET | Freq: Every day | ORAL | Status: DC
  Administered 2017-02-21 – 2017-02-23 (×3): 1 mg via ORAL
  Filled 2017-02-20 (×3): qty 1

## 2017-02-20 MED ORDER — ONDANSETRON 4 MG PO TBDP
4.0000 mg | ORAL_TABLET | Freq: Once | ORAL | Status: AC
  Administered 2017-02-20: 4 mg via ORAL

## 2017-02-20 MED ORDER — NICOTINE 21 MG/24HR TD PT24
21.0000 mg | MEDICATED_PATCH | Freq: Every day | TRANSDERMAL | Status: DC
  Administered 2017-02-21: 21 mg via TRANSDERMAL
  Filled 2017-02-20 (×2): qty 1

## 2017-02-20 MED ORDER — THIAMINE HCL 100 MG/ML IJ SOLN
100.0000 mg | Freq: Every day | INTRAMUSCULAR | Status: DC
  Filled 2017-02-20: qty 2

## 2017-02-20 MED ORDER — HEPARIN SODIUM (PORCINE) 5000 UNIT/ML IJ SOLN
5000.0000 [IU] | Freq: Three times a day (TID) | INTRAMUSCULAR | Status: DC
  Administered 2017-02-20 – 2017-02-23 (×8): 5000 [IU] via SUBCUTANEOUS
  Filled 2017-02-20 (×8): qty 1

## 2017-02-20 MED ORDER — SODIUM CHLORIDE 0.9 % IV BOLUS (SEPSIS)
2000.0000 mL | Freq: Once | INTRAVENOUS | Status: AC
  Administered 2017-02-20: 2000 mL via INTRAVENOUS

## 2017-02-20 MED ORDER — VITAMIN B-1 100 MG PO TABS
100.0000 mg | ORAL_TABLET | Freq: Every day | ORAL | Status: DC
  Administered 2017-02-21 – 2017-02-23 (×3): 100 mg via ORAL
  Filled 2017-02-20 (×4): qty 1

## 2017-02-20 MED ORDER — IOPAMIDOL (ISOVUE-300) INJECTION 61%
INTRAVENOUS | Status: AC
  Administered 2017-02-20: 100 mL
  Filled 2017-02-20: qty 100

## 2017-02-20 MED ORDER — ADULT MULTIVITAMIN W/MINERALS CH
1.0000 | ORAL_TABLET | Freq: Every day | ORAL | Status: DC
  Administered 2017-02-21 – 2017-02-23 (×3): 1 via ORAL
  Filled 2017-02-20 (×3): qty 1

## 2017-02-20 MED ORDER — POTASSIUM CHLORIDE CRYS ER 20 MEQ PO TBCR
30.0000 meq | EXTENDED_RELEASE_TABLET | Freq: Once | ORAL | Status: AC
  Administered 2017-02-20: 23:00:00 30 meq via ORAL
  Filled 2017-02-20: qty 1

## 2017-02-20 MED ORDER — SODIUM CHLORIDE 0.9 % IV SOLN
INTRAVENOUS | Status: DC
  Administered 2017-02-20 – 2017-02-21 (×2): via INTRAVENOUS

## 2017-02-20 MED ORDER — OXYCODONE HCL 5 MG PO TABS
5.0000 mg | ORAL_TABLET | ORAL | Status: DC | PRN
  Administered 2017-02-20 – 2017-02-21 (×2): 10 mg via ORAL
  Filled 2017-02-20 (×2): qty 2

## 2017-02-20 MED ORDER — VANCOMYCIN HCL IN DEXTROSE 1-5 GM/200ML-% IV SOLN
1000.0000 mg | Freq: Once | INTRAVENOUS | Status: AC
  Administered 2017-02-20: 1000 mg via INTRAVENOUS
  Filled 2017-02-20: qty 200

## 2017-02-20 MED ORDER — PIPERACILLIN-TAZOBACTAM 3.375 G IVPB 30 MIN
3.3750 g | Freq: Once | INTRAVENOUS | Status: AC
  Administered 2017-02-20: 3.375 g via INTRAVENOUS
  Filled 2017-02-20: qty 50

## 2017-02-20 MED ORDER — ALBUTEROL SULFATE (2.5 MG/3ML) 0.083% IN NEBU: 3.0000 mL | INHALATION_SOLUTION | Freq: Four times a day (QID) | RESPIRATORY_TRACT | Status: DC | PRN

## 2017-02-20 NOTE — H&P (Signed)
History and Physical    Alvin Benton HQI:[id number] DOB: 1950/12/14 DOA: 02/20/2017  PCP: No PCP Per Patient  Patient coming from: home  Chief Complaint: nausea emesis for 1 week  HPI: Alvin Benton is a 66 y.o. male with medical history significant of alcohol abuse, tobacco abuse, essential hypertension. 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 Pt has afib on chart but he denies having atrial fibrillation and is not on any rate controlling agents or anticoagulation. Pt presents with one week complaint of nausea and emesis, has been persistent. Nothing he is aware of makes it better or worse. Has had poor oral intake as a results. Also endorses NB diarrhea  ED Course: Had lactic acidosis, hyponatremia, hypokalemia as such we were consulted for further evaluation and recommendations.  Review of Systems: As per HPI otherwise 10 point review of systems negative.    Past Medical History:  Diagnosis Date  . Blood in stool   . Diverticulitis   . ETOH abuse   . GERD (gastroesophageal reflux disease)   . Heart murmur   . History of urinary tract infection   . Hypertension   . IBS (irritable bowel syndrome)   . Migraine    "none in a long time" (03/14/2016)  . Nocturia   . Pneumonia    childhood  . Seasonal allergies   . Tobacco abuse     Past Surgical History:  Procedure Laterality Date  . COLONOSCOPY    . CYSTOSCOPY WITH STENT PLACEMENT Left 10/03/2016   Procedure: CYSTOSCOPY WITH  LEFT URETERAL CATHETER PLACEMENT;  Surgeon: Bjorn Pippin, MD;  Location: [facility];  Service: Urology;  Laterality: Left;  . PROCTOSCOPY  10/03/2016   Procedure: PROCTOSCOPY;  Surgeon: Romie Levee, MD;  Location: [facility];  Service: General;;  . TONSILLECTOMY       reports that he has been smoking Cigarettes.  He has a 40.00 pack-year smoking history. He has never used smokeless tobacco. He reports that he drinks about 1.8 oz of alcohol per  week . He reports that he does not use drugs.  No Known Allergies  Family History  Problem Relation Age of Onset  . Stroke Mother   . Colon cancer Neg Hx   . Esophageal cancer Neg Hx   . Rectal cancer Neg Hx   . Stomach cancer Neg Hx     Prior to Admission medications   Medication Sig Start Date End Date Taking? Authorizing Provider  acetaminophen (TYLENOL) 325 MG tablet Take 1-2 tablets (325-650 mg total) by mouth every 6 (six) hours as needed for fever, headache, mild pain or moderate pain. 10/07/16   Romie Levee, MD  albuterol (PROVENTIL HFA;VENTOLIN HFA) 108 (90 BASE) MCG/ACT inhaler Inhale 2 puffs into the lungs every 6 (six) hours as needed for wheezing or shortness of breath. 07/17/15   Sheliah Hatch, MD  ibuprofen (ADVIL,MOTRIN) 400 MG tablet Take 1-1.5 tablets (400-600 mg total) by mouth every 6 (six) hours as needed for fever or headache. 10/07/16   Romie Levee, MD  Multiple Vitamin (MULTIVITAMIN WITH MINERALS) TABS tablet Take 1 tablet by mouth daily. 05/09/16   Rodolph Bong, MD  oxyCODONE (OXY IR/ROXICODONE) 5 MG immediate release tablet Take 1-2 tablets (5-10 mg total) by mouth every 4 (four) hours as needed for moderate pain, severe pain or breakthrough pain. 10/07/16   Romie Levee, MD  potassium chloride SA (K-DUR,KLOR-CON) 20 MEQ tablet Take 2 tablets (40 mEq total) by  mouth 2 (two) times daily. 08/29/16   Benjiman Core, MD  Simethicone (GAS RELIEF PO) Take 1 tablet by mouth every 6 (six) hours as needed for flatulence.     Historical Provider, MD  valsartan (DIOVAN) 160 MG tablet Take 160 mg by mouth daily. 07/12/16   Historical Provider, MD    Physical Exam: Vitals:   02/20/17 1700 02/20/17 1715 02/20/17 1730 02/20/17 1745  BP: 130/64 130/64 (!) 129/53 128/63  Pulse: 70 72 69 72  Resp: Temp:      TempSrc:      SpO2: 100% 100% 100% 100%  Weight:      Height:        Constitutional: NAD, calm, comfortable Vitals:   02/20/17 1700  02/20/17 1715 02/20/17 1730 02/20/17 1745  BP: 130/64 130/64 (!) 129/53 128/63  Pulse: 70 72 69 72  Resp: Temp:      TempSrc:      SpO2: 100% 100% 100% 100%  Weight:      Height:       Eyes: PERRL, lids and conjunctivae normal ENMT: Mucous membranes are dry.  Posterior pharynx clear of any exudate or lesions.Normal dentition.  Neck: normal, supple, no masses, no thyromegaly Respiratory: clear to auscultation bilaterally, no wheezing, no crackles. Normal respiratory effort. No accessory muscle use.  Cardiovascular: Regular rate and rhythm, no murmurs / rubs / gallops.  Abdomen: no tenderness, no masses palpated. No hepatosplenomegaly. Bowel sounds positive.  Musculoskeletal: no clubbing / cyanosis. No joint deformity upper and lower extremities. Skin: no rashes, lesions, ulcers. No induration, on limited exam. Neurologic: CN 3-12 grossly intact. Sensation intact, Psychiatric: flat affect. Alert and oriented x 3. Normal mood.    Labs on Admission: I have personally reviewed following labs and imaging studies  CBC:  Recent Labs Lab 02/20/17 1600  WBC 14.9*  HGB 17.3*  HCT 44.6  MCV 85.0  PLT 549*   Basic Metabolic Panel:  Recent Labs Lab 02/20/17 1600  NA 113*  K 2.9*  CL 67*  CO2 25  GLUCOSE 103*  BUN 16  CREATININE 1.49*  CALCIUM 9.5   GFR: Estimated Creatinine Clearance: 50.1 mL/min (A) (by C-G formula based on SCr of 1.49 mg/dL (H)). Liver Function Tests:  Recent Labs Lab 02/20/17 1600  AST 76*  ALT 36  ALKPHOS 57  BILITOT 1.5*  PROT 7.0  ALBUMIN 3.9   No results for input(s): LIPASE, AMYLASE in the last 168 hours. No results for input(s): AMMONIA in the last 168 hours. Coagulation Profile: No results for input(s): INR, PROTIME in the last 168 hours. Cardiac Enzymes: No results for input(s): CKTOTAL, CKMB, CKMBINDEX, TROPONINI in the last 168 hours. BNP (last 3 results) No results for input(s): PROBNP in the last 8760  hours. HbA1C: No results for input(s): HGBA1C in the last 72 hours. CBG:  Recent Labs Lab 02/20/17 1641  GLUCAP 105*   Lipid Profile: No results for input(s): CHOL, HDL, LDLCALC, TRIG, CHOLHDL, LDLDIRECT in the last 72 hours. Thyroid Function Tests: No results for input(s): TSH, T4TOTAL, FREET4, T3FREE, THYROIDAB in the last 72 hours. Anemia Panel: No results for input(s): VITAMINB12, FOLATE, FERRITIN, TIBC, IRON, RETICCTPCT in the last 72 hours. Urine analysis:    Component Value Date/Time   COLORURINE AMBER (A) 08/29/2016 0838   APPEARANCEUR CLOUDY (A) 08/29/2016 0838   LABSPEC 1.018 08/29/2016 0838   PHURINE 6.5 08/29/2016 0838   GLUCOSEU NEGATIVE 08/29/2016 0838   HGBUR  NEGATIVE 08/29/2016 0838   BILIRUBINUR MODERATE (A) 08/29/2016 0838   KETONESUR 15 (A) 08/29/2016 0838   PROTEINUR NEGATIVE 08/29/2016 0838   NITRITE NEGATIVE 08/29/2016 0838   LEUKOCYTESUR MODERATE (A) 08/29/2016 0838    Radiological Exams on Admission: Ct Head Wo Contrast  Result Date: 02/20/2017 CLINICAL DATA:  Multiple falls with head injury EXAM: CT HEAD WITHOUT CONTRAST TECHNIQUE: Contiguous axial images were obtained from the base of the skull through the vertex without intravenous contrast. COMPARISON:  None. FINDINGS: Brain: No definite acute territorial infarction, hemorrhage or focal mass lesion is visualized. Mild periventricular white matter hypodensity suspect secondary to small vessel ischemic changes. Mild cortical atrophy. Slight ventricular prominence since, likely related to the presence of atrophy. Vascular: Slightly dense appearing proximal left MCA, suspect that this is secondary to vessel tortuosity. Scattered calcifications at the carotid siphons. Skull: No fracture or suspicious bone lesion. Sinuses/Orbits: Minimal mucosal thickening in the maxillary and ethmoid sinuses. No acute orbital abnormality. Other: None IMPRESSION: No definite CT evidence for acute intracranial abnormality. Mild  periventricular white matter small vessel ischemic changes. Electronically Signed   By: Jasmine Pang M.D.   On: 02/20/2017 16:30   Dg Chest Port 1 View  Result Date: 02/20/2017 CLINICAL DATA:  Weakness, malaise and falls for 1 week. EXAM: PORTABLE CHEST 1 VIEW COMPARISON:  09/13/2016 and prior radiographs FINDINGS: The cardiomediastinal silhouette is unremarkable. There is no evidence of focal airspace disease, pulmonary edema, suspicious pulmonary nodule/mass, pleural effusion, or pneumothorax. No acute bony abnormalities are identified. IMPRESSION: No active disease. Electronically Signed   By: Harmon Pier M.D.   On: 02/20/2017 16:56    EKG: Ordered but not available for review on chart  Assessment/Plan  Nausea and vomiting - suspect viral gastroetiology given report of NB diarrhea. But given surgical history will obtain ct scan of abdomen - assess lipase given history of alcohol use. If elevated will need to make npo  Hypokalemia - 30 meq replaced while in the ED. Will place order for 30 meq more via IV  - monitor on telemetry    Dehydration with hyponatremia - We'll plan on normal saline rehydration. Monitor BMPs every 6 hours and will plan for correction of 8 mEq every 24 hours.  Active Problems:   Tobacco use disorder - Place order for nicotine patch    Alcohol abuse - Place on ciwa protocol    Atrial fibrillation (HCC) - in chart on problem list but patient denies. No mention of this on prior d/c summary - monitor on telemetry    Essential hypertension - Hold arb       DVT prophylaxis: heparin Code Status: full Family Communication: no family at bedside Disposition Plan:  telemetry Consults called: none Admission status: obs tele   Penny Pia MD [facility] Pager [phone]  If 7PM-7AM, please contact night-coverage [email or web address] Password [facility]  02/20/2017, 6:09 PM

## 2017-02-20 NOTE — ED Notes (Signed)
Xray at the bedside.

## 2017-02-20 NOTE — ED Notes (Signed)
Attempted to collect urine. Pt unable to provide at this time. Urinal at bedside. Continue to encourage output.

## 2017-02-20 NOTE — ED Notes (Signed)
MD Zammit at the bedside.  

## 2017-02-20 NOTE — ED Notes (Signed)
Pt taken to CT.

## 2017-02-20 NOTE — ED Notes (Signed)
This RN and RN Maryruth Bun drew two sets of blood cultures at this time. One was obtained from the right FA and one was obtained from the L FA with IV placement.

## 2017-02-20 NOTE — ED Triage Notes (Signed)
Pt presents to ED for assessment after 1 week of n/v/d with feelings of weakness, malaise, and anorexia.  Pt states he has fallen multiple times this week due to his weakness (denies LOC), and states he has hit his head multiple times.  Patient has scrapes, and bruises everywhere.  C/o headache, denies other pain.  Pt states these symptoms are similar to when he as admitted in November for diverticulitis.

## 2017-02-20 NOTE — ED Provider Notes (Signed)
[facility]-EMERGENCY DEPT Provider Note   CSN: [id number] Arrival date & time: 02/20/17  1544     History   Chief Complaint Chief Complaint  Patient presents with  . Fall  . Tremors  . Fatigue    HPI Alvin Benton is a 66 y.o. male.  Patient complains of weakness and vomiting and diarrhea for a week. He has had multiple falls from his weakness. Patient has a history of EtOH abuse    Weakness  Primary symptoms include no focal weakness. This is a new problem. The current episode started more than 1 week ago. The problem has not changed since onset.There was no focality noted. There has been no fever. Associated symptoms include vomiting. Pertinent negatives include no shortness of breath, no chest pain and no headaches. Associated medical issues do not include trauma.    Past Medical History:  Diagnosis Date  . Blood in stool   . Diverticulitis   . ETOH abuse   . GERD (gastroesophageal reflux disease)   . Heart murmur   . History of urinary tract infection   . Hypertension   . IBS (irritable bowel syndrome)   . Migraine    "none in a long time" (03/14/2016)  . Nocturia   . Pneumonia    childhood  . Seasonal allergies   . Tobacco abuse     Patient Active Problem List   Diagnosis Date Noted  . Dehydration with hyponatremia 02/20/2017  . Alcohol withdrawal (HCC) 06/04/2016  . Volume depletion 06/04/2016  . Hypokalemia 06/04/2016  . Diverticulitis of intestine with perforation without bleeding 06/04/2016  . Essential hypertension   . Hypomagnesemia   . Ileus (HCC) 05/05/2016  . Systolic murmur 05/05/2016  . Anxiety state 04/19/2016  . 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 Hypoxia 04/05/2016  . Atrial fibrillation (HCC) 04/05/2016  . Diverticulitis of large intestine with perforation and abscess 03/14/2016  . Chronic constipation 03/08/2016  . Alcohol abuse 02/15/2016  . Tobacco use disorder 07/17/2015  . Allergic rhinitis 07/17/2015  . Reactive airway disease with  wheezing 07/17/2015  . Overweight (BMI 25.0-29.9) 07/17/2015  . Hand pain, left 01/12/2014  . LLQ pain-chronic, intermittent and episodic 09/17/2013    Past Surgical History:  Procedure Laterality Date  . COLONOSCOPY    . CYSTOSCOPY WITH STENT PLACEMENT Left 10/03/2016   Procedure: CYSTOSCOPY WITH  LEFT URETERAL CATHETER PLACEMENT;  Surgeon: Bjorn Pippin, MD;  Location: [facility];  Service: Urology;  Laterality: Left;  . PROCTOSCOPY  10/03/2016   Procedure: PROCTOSCOPY;  Surgeon: Romie Levee, MD;  Location: [facility];  Service: General;;  . TONSILLECTOMY         Home Medications    Prior to Admission medications   Medication Sig Start Date End Date Taking? Authorizing Provider  acetaminophen (TYLENOL) 325 MG tablet Take 1-2 tablets (325-650 mg total) by mouth every 6 (six) hours as needed for fever, headache, mild pain or moderate pain. 10/07/16   Romie Levee, MD  albuterol (PROVENTIL HFA;VENTOLIN HFA) 108 (90 BASE) MCG/ACT inhaler Inhale 2 puffs into the lungs every 6 (six) hours as needed for wheezing or shortness of breath. 07/17/15   Sheliah Hatch, MD  ibuprofen (ADVIL,MOTRIN) 400 MG tablet Take 1-1.5 tablets (400-600 mg total) by mouth every 6 (six) hours as needed for fever or headache. 10/07/16   Romie Levee, MD  Multiple Vitamin (MULTIVITAMIN WITH MINERALS) TABS tablet Take 1 tablet by mouth daily. 05/09/16   Rodolph Bong, MD  oxyCODONE (OXY IR/ROXICODONE) 5 MG immediate  release tablet Take 1-2 tablets (5-10 mg total) by mouth every 4 (four) hours as needed for moderate pain, severe pain or breakthrough pain. 10/07/16   Romie Levee, MD  potassium chloride SA (K-DUR,KLOR-CON) 20 MEQ tablet Take 2 tablets (40 mEq total) by mouth 2 (two) times daily. 08/29/16   Benjiman Core, MD  Simethicone (GAS RELIEF PO) Take 1 tablet by mouth every 6 (six) hours as needed for flatulence.     Historical Provider, MD  valsartan (DIOVAN) 160 MG tablet Take 160 mg by mouth daily. 07/12/16    Historical Provider, MD    Family History Family History  Problem Relation Age of Onset  . Stroke Mother   . Colon cancer Neg Hx   . Esophageal cancer Neg Hx   . Rectal cancer Neg Hx   . Stomach cancer Neg Hx     Social History Social History  Substance Use Topics  . Smoking status: Current Every Day Smoker    Packs/day: 1.00    Years: 40.00    Types: Cigarettes  . Smokeless tobacco: Never Used  . Alcohol use 1.8 oz/week    3 Shots of liquor per week     Comment: pt states does not drink daily has beer with football     Allergies   Patient has no known allergies.   Review of Systems Review of Systems  Constitutional: Negative for appetite change and fatigue.  HENT: Negative for congestion, ear discharge and sinus pressure.   Eyes: Negative for discharge.  Respiratory: Negative for cough and shortness of breath.   Cardiovascular: Negative for chest pain.  Gastrointestinal: Positive for vomiting. Negative for abdominal pain and diarrhea.  Genitourinary: Negative for frequency and hematuria.  Musculoskeletal: Negative for back pain.  Skin: Negative for rash.  Neurological: Positive for weakness. Negative for focal weakness, seizures and headaches.  Psychiatric/Behavioral: Negative for hallucinations.     Physical Exam Updated Vital Signs BP 128/63   Pulse 72   Temp 97.8 F (36.6 C) (Oral)   Resp 20   Ht  (1.753 m)   Wt 169 lb (76.7 kg)   SpO2 100%   BMI 24.96 kg/m   Physical Exam  Constitutional: He is oriented to person, place, and time. He appears well-developed.  HENT:  Head: Normocephalic.  Eyes: Conjunctivae and EOM are normal. No scleral icterus.  Neck: Neck supple. No thyromegaly present.  Cardiovascular: Normal rate and regular rhythm.  Exam reveals no gallop and no friction rub.   No murmur heard. Pulmonary/Chest: No stridor. He has no wheezes. He has no rales. He exhibits no tenderness.  Abdominal: He exhibits no distension. There is no  tenderness. There is no rebound.  Musculoskeletal: Normal range of motion. He exhibits no edema.  Lymphadenopathy:    He has no cervical adenopathy.  Neurological: He is oriented to person, place, and time. He exhibits normal muscle tone. Coordination normal.  Skin: No rash noted. No erythema.  Psychiatric: He has a normal mood and affect. His behavior is normal.     ED Treatments / Results  Labs (all labs ordered are listed, but only abnormal results are displayed) Labs Reviewed  BASIC METABOLIC PANEL - Abnormal; Notable for the following:       Result Value   Sodium 113 (*)    Potassium 2.9 (*)    Chloride 67 (*)    Glucose, Bld 103 (*)    Creatinine, Ser 1.49 (*)    GFR calc non Af Amer 48 (*)  GFR calc Af Amer 55 (*)    Anion gap 21 (*)    All other components within normal limits  CBC - Abnormal; Notable for the following:    WBC 14.9 (*)    Hemoglobin 17.3 (*)    MCHC 38.8 (*)    Platelets 549 (*)    All other components within normal limits  HEPATIC FUNCTION PANEL - Abnormal; Notable for the following:    AST 76 (*)    Total Bilirubin 1.5 (*)    Indirect Bilirubin 1.1 (*)    All other components within normal limits  CBG MONITORING, ED - Abnormal; Notable for the following:    Glucose-Capillary 105 (*)    All other components within normal limits  I-STAT CG4 LACTIC ACID, ED - Abnormal; Notable for the following:    Lactic Acid, Venous 5.59 (*)    All other components within normal limits  CULTURE, BLOOD (ROUTINE X 2)  CULTURE, BLOOD (ROUTINE X 2)  URINALYSIS, ROUTINE W REFLEX MICROSCOPIC    EKG  EKG Interpretation None       Radiology Ct Head Wo Contrast  Result Date: 02/20/2017 CLINICAL DATA:  Multiple falls with head injury EXAM: CT HEAD WITHOUT CONTRAST TECHNIQUE: Contiguous axial images were obtained from the base of the skull through the vertex without intravenous contrast. COMPARISON:  None. FINDINGS: Brain: No definite acute territorial  infarction, hemorrhage or focal mass lesion is visualized. Mild periventricular white matter hypodensity suspect secondary to small vessel ischemic changes. Mild cortical atrophy. Slight ventricular prominence since, likely related to the presence of atrophy. Vascular: Slightly dense appearing proximal left MCA, suspect that this is secondary to vessel tortuosity. Scattered calcifications at the carotid siphons. Skull: No fracture or suspicious bone lesion. Sinuses/Orbits: Minimal mucosal thickening in the maxillary and ethmoid sinuses. No acute orbital abnormality. Other: None IMPRESSION: No definite CT evidence for acute intracranial abnormality. Mild periventricular white matter small vessel ischemic changes. Electronically Signed   By: Jasmine Pang M.D.   On: 02/20/2017 16:30   Dg Chest Port 1 View  Result Date: 02/20/2017 CLINICAL DATA:  Weakness, malaise and falls for 1 week. EXAM: PORTABLE CHEST 1 VIEW COMPARISON:  09/13/2016 and prior radiographs FINDINGS: The cardiomediastinal silhouette is unremarkable. There is no evidence of focal airspace disease, pulmonary edema, suspicious pulmonary nodule/mass, pleural effusion, or pneumothorax. No acute bony abnormalities are identified. IMPRESSION: No active disease. Electronically Signed   By: Harmon Pier M.D.   On: 02/20/2017 16:56    Procedures Procedures (including critical care time)  Medications Ordered in ED Medications  vancomycin (VANCOCIN) IVPB 1000 mg/200 mL premix (1,000 mg Intravenous New Bag/Given 02/20/17 1711)  potassium chloride 30 mEq in sodium chloride 0.9 % 265 mL (KCL MULTIRUN) IVPB (not administered)  nicotine (NICODERM CQ - dosed in mg/24 hours) patch 21 mg (not administered)  LORazepam (ATIVAN) tablet 1 mg (not administered)    Or  LORazepam (ATIVAN) injection 1 mg (not administered)  thiamine (VITAMIN B-1) tablet 100 mg (not administered)    Or  thiamine (B-1) injection 100 mg (not administered)  folic acid (FOLVITE)  tablet 1 mg (not administered)  multivitamin with minerals tablet 1 tablet (not administered)  iopamidol (ISOVUE-300) 61 % injection (not administered)  ondansetron (ZOFRAN-ODT) disintegrating tablet 4 mg (4 mg Oral Given 02/20/17 1559)  sodium chloride 0.9 % bolus 2,000 mL (2,000 mLs Intravenous New Bag/Given 02/20/17 1644)  piperacillin-tazobactam (ZOSYN) IVPB 3.375 g (0 g Intravenous Stopped 02/20/17 1750)     Initial  Impression / Assessment and Plan / ED Course  I have reviewed the triage vital signs and the nursing notes.  Pertinent labs & imaging results that were available during my care of the patient were reviewed by me and considered in my medical decision making (see chart for details).     CRITICAL CARE Performed by: Ursula Dermody L Total critical care time: 35 minutes Critical care time was exclusive of separately billable procedures and treating other patients. Critical care was necessary to treat or prevent imminent or life-threatening deterioration. Critical care was time spent personally by me on the following activities: development of treatment plan with patient and/or surrogate as well as nursing, discussions with consultants, evaluation of patient's response to treatment, examination of patient, obtaining history from patient or surrogate, ordering and performing treatments and interventions, ordering and review of laboratory studies, ordering and review of radiographic studies, pulse oximetry and re-evaluation of patient's condition.   Final Clinical Impressions(s) / ED Diagnoses   Final diagnoses:  None   Patient with hyponatremia hypokalemia. He'll be admitted to internal medicine for further workup and treatment New Prescriptions New Prescriptions   No medications on file     Bethann Berkshire, MD 02/20/17 [phone]

## 2017-02-20 NOTE — ED Notes (Signed)
Phlebotomy at the bedside. Stated to this RN and brought to attention that only one blood culture was in process in the lab. This RN verified with lab that two cultures had been drawn and tubed to lab while starting two seperate IVs at 1630. Phlebotomy called Lab and Lab stated that they only had one set of cultures. This RN has attempted to call Lab multiple times to clarify. Two cultures were tubed to the lab.

## 2017-02-21 DIAGNOSIS — F17210 Nicotine dependence, cigarettes, uncomplicated: Secondary | ICD-10-CM | POA: Diagnosis present

## 2017-02-21 DIAGNOSIS — E871 Hypo-osmolality and hyponatremia: Secondary | ICD-10-CM | POA: Diagnosis present

## 2017-02-21 DIAGNOSIS — Z9049 Acquired absence of other specified parts of digestive tract: Secondary | ICD-10-CM | POA: Diagnosis not present

## 2017-02-21 DIAGNOSIS — E860 Dehydration: Secondary | ICD-10-CM | POA: Diagnosis present

## 2017-02-21 DIAGNOSIS — Z9889 Other specified postprocedural states: Secondary | ICD-10-CM | POA: Diagnosis not present

## 2017-02-21 DIAGNOSIS — F1010 Alcohol abuse, uncomplicated: Secondary | ICD-10-CM | POA: Diagnosis present

## 2017-02-21 DIAGNOSIS — Z87440 Personal history of urinary (tract) infections: Secondary | ICD-10-CM | POA: Diagnosis not present

## 2017-02-21 DIAGNOSIS — K219 Gastro-esophageal reflux disease without esophagitis: Secondary | ICD-10-CM | POA: Diagnosis present

## 2017-02-21 DIAGNOSIS — R296 Repeated falls: Secondary | ICD-10-CM | POA: Diagnosis present

## 2017-02-21 DIAGNOSIS — K580 Irritable bowel syndrome with diarrhea: Secondary | ICD-10-CM | POA: Diagnosis present

## 2017-02-21 DIAGNOSIS — Z79899 Other long term (current) drug therapy: Secondary | ICD-10-CM | POA: Diagnosis not present

## 2017-02-21 DIAGNOSIS — I4891 Unspecified atrial fibrillation: Secondary | ICD-10-CM | POA: Diagnosis present

## 2017-02-21 DIAGNOSIS — Z823 Family history of stroke: Secondary | ICD-10-CM | POA: Diagnosis not present

## 2017-02-21 DIAGNOSIS — E876 Hypokalemia: Secondary | ICD-10-CM | POA: Diagnosis present

## 2017-02-21 DIAGNOSIS — I10 Essential (primary) hypertension: Secondary | ICD-10-CM | POA: Diagnosis present

## 2017-02-21 LAB — BASIC METABOLIC PANEL
ANION GAP: 9 (ref 5–15)
Anion gap: 13 (ref 5–15)
Anion gap: 15 (ref 5–15)
Anion gap: 8 (ref 5–15)
BUN: 16 mg/dL (ref 6–20)
BUN: 18 mg/dL (ref 6–20)
BUN: 16 mg/dL (ref 6–20)
BUN: 14 mg/dL (ref 6–20)
CALCIUM: 8.1 mg/dL — AB (ref 8.9–10.3)
CALCIUM: 8.3 mg/dL — AB (ref 8.9–10.3)
CALCIUM: 8.3 mg/dL — AB (ref 8.9–10.3)
CHLORIDE: 83 mmol/L — AB (ref 101–111)
CO2: 24 mmol/L (ref 22–32)
CO2: 23 mmol/L (ref 22–32)
CO2: 27 mmol/L (ref 22–32)
CO2: 27 mmol/L (ref 22–32)
CREATININE: 1.09 mg/dL (ref 0.61–1.24)
CREATININE: 0.91 mg/dL (ref 0.61–1.24)
CREATININE: 0.76 mg/dL (ref 0.61–1.24)
Calcium: 7.9 mg/dL — ABNORMAL LOW (ref 8.9–10.3)
Chloride: 85 mmol/L — ABNORMAL LOW (ref 101–111)
Chloride: 86 mmol/L — ABNORMAL LOW (ref 101–111)
Chloride: 88 mmol/L — ABNORMAL LOW (ref 101–111)
Creatinine, Ser: 0.92 mg/dL (ref 0.61–1.24)
GFR calc Af Amer: >60 mL/min (ref >60)
GLUCOSE: 111 mg/dL — AB (ref 65–99)
GLUCOSE: 103 mg/dL — AB (ref 65–99)
GLUCOSE: 110 mg/dL — AB (ref 65–99)
Glucose, Bld: 98 mg/dL (ref 65–99)
POTASSIUM: 3 mmol/L — AB (ref 3.5–5.1)
Potassium: 3.3 mmol/L — ABNORMAL LOW (ref 3.5–5.1)
Potassium: 2.7 mmol/L — CL (ref 3.5–5.1)
Potassium: 3.2 mmol/L — ABNORMAL LOW (ref 3.5–5.1)
SODIUM: 120 mmol/L — AB (ref 135–145)
Sodium: 123 mmol/L — ABNORMAL LOW (ref 135–145)
Sodium: 122 mmol/L — ABNORMAL LOW (ref 135–145)
Sodium: 123 mmol/L — ABNORMAL LOW (ref 135–145)

## 2017-02-21 LAB — CBC
HCT: 35.9 % — ABNORMAL LOW (ref 39.0–52.0)
Hemoglobin: 13.2 g/dL (ref 13.0–17.0)
MCH: 32 pg (ref 26.0–34.0)
MCHC: 36.8 g/dL — ABNORMAL HIGH (ref 30.0–36.0)
MCV: 86.9 fL (ref 78.0–100.0)
PLATELETS: 361 10*3/uL (ref 150–400)
RBC: 4.13 MIL/uL — ABNORMAL LOW (ref 4.22–5.81)
RDW: 12.1 % (ref 11.5–15.5)
WBC: 9.6 10*3/uL (ref 4.0–10.5)

## 2017-02-21 LAB — HIV ANTIBODY (ROUTINE TESTING W REFLEX): HIV SCREEN 4TH GENERATION: NONREACTIVE

## 2017-02-21 MED ORDER — POTASSIUM CHLORIDE 2 MEQ/ML IV SOLN
30.0000 meq | Freq: Once | INTRAVENOUS | Status: AC
  Administered 2017-02-21: 30 meq via INTRAVENOUS
  Filled 2017-02-21: qty 15

## 2017-02-21 MED ORDER — MAGNESIUM SULFATE 2 GM/50ML IV SOLN
2.0000 g | Freq: Once | INTRAVENOUS | Status: AC
  Administered 2017-02-21: 2 g via INTRAVENOUS
  Filled 2017-02-21: qty 50

## 2017-02-21 MED ORDER — POTASSIUM CHLORIDE CRYS ER 20 MEQ PO TBCR
20.0000 meq | EXTENDED_RELEASE_TABLET | Freq: Once | ORAL | Status: AC
  Administered 2017-02-21: 20 meq via ORAL
  Filled 2017-02-21: qty 1

## 2017-02-21 NOTE — Progress Notes (Signed)
CRITICAL VALUE ALERT  Critical value received: NA=118;K=2.7  Date of notification:  02/20/2017  Time of notification:  2235  Critical value read back:Yes.    Nurse who received alert:  Nelson Chimes  MD notified (1st page):  NP Craige Cotta  Time of first page:  2245  MD notified (2nd page):  Time of second page:  Responding MD:  NP Craige Cotta  Time MD responded:  2307

## 2017-02-21 NOTE — Progress Notes (Signed)
PROGRESS NOTE    Alvin Benton  ZOX:[id number] DOB: November 15, 1950 DOA: 02/20/2017 PCP: Marilynn Rail, MD    Brief Narrative:  Alvin Benton is a 66 y.o. male with medical history significant of alcohol abuse, tobacco abuse, essential hypertension. 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 Pt has afib on chart but he denies having atrial fibrillation and is not on any rate controlling agents or anticoagulation  Pt admitted with nausea and emesis, hypokalemia, and profound hyponatremia   Assessment & Plan:   Hyponatremia - improving with saline administration. D/c'd normal saline given improvement in values. Continue BMP monitoring.  Active Problems:   Tobacco use disorder - nicotine patch    Alcohol abuse -  CIWA protocol    Atrial fibrillation (HCC) - listed on problem list. CV strip showing sinus rhythm - will d/c telemetry    Essential hypertension   Hypokalemia - K dur - reassess next am.    Dehydration with hyponatremia - improving. Liberalized diet.   DVT prophylaxis:  Heparin Code Status: Full Family Communication: None at bedside Disposition Plan: Pending improvement in condition. Most likely DC next a.m.   Consultants:   None   Procedures: None   Antimicrobials: None   Subjective: The patient has no new complaints. No acute issues overnight  Objective: Vitals:   02/21/17 0557 02/21/17 0924 02/21/17 1200 02/21/17 1337  BP: (!) 128/58 (!) 149/64 (!) 128/56 (!) 128/56  Pulse: 65 67 72 72  Resp: 18     Temp: 97.8 F (36.6 C)   97.8 F (36.6 C)  TempSrc:    Oral  SpO2: 97%   97%  Weight:      Height:        Intake/Output Summary (Last 24 hours) at 02/21/17 1617 Last data filed at 02/21/17 1200  Gross per 24 hour  Intake             3195 ml  Output              600 ml  Net             2595 ml   Filed Weights   02/20/17 1635 02/20/17 2052  Weight: 76.7 kg (169 lb)  76.8 kg (169 lb 5 oz)    Examination:  General exam: Appears calm and comfortable, in nad. Respiratory system: Clear to auscultation. Respiratory effort normal. Cardiovascular system: S1 & S2 heard, RRR. No JVD, murmurs, rubs, gallops or clicks. No pedal edema. Gastrointestinal system: Abdomen is nondistended, soft and nontender. No organomegaly or masses felt. Normal bowel sounds heard. Central nervous system: Alert and oriented. No focal neurological deficits. Extremities: Symmetric 5 x 5 power. Skin: No rashes, lesions or ulcers Psychiatry: Judgement and insight appear normal. Mood & affect appropriate.   Data Reviewed: I have personally reviewed following labs and imaging studies  CBC:  Recent Labs Lab 02/20/17 1600 02/20/17 2117 02/21/17 0557  WBC 14.9* 10.9* 9.6  HGB 17.3* 13.9 13.2  HCT 44.6 36.8* 35.9*  MCV 85.0 85.8 86.9  PLT 549* 393 361   Basic Metabolic Panel:  Recent Labs Lab 02/20/17 1600 02/20/17 2117 02/21/17 0557 02/21/17 1126  NA 113* 118* 120* 123*  K 2.9* 2.7* 3.0* 3.3*  CL 67* 78* 83* 85*  CO2 GLUCOSE 103* 91 98 111*  BUN CREATININE 1.49* 1.25* 1.09 0.92  CALCIUM 9.5 8.1* 7.9* 8.1*  MG  --  1.5*  --   --   PHOS  --  1.9*  --   --    GFR: Estimated Creatinine Clearance: 79.2 mL/min (by C-G formula based on SCr of 0.92 mg/dL). Liver Function Tests:  Recent Labs Lab 02/20/17 1600  AST 76*  ALT 36  ALKPHOS 57  BILITOT 1.5*  PROT 7.0  ALBUMIN 3.9    Recent Labs Lab 02/20/17 1848  LIPASE 29   No results for input(s): AMMONIA in the last 168 hours. Coagulation Profile: No results for input(s): INR, PROTIME in the last 168 hours. Cardiac Enzymes: No results for input(s): CKTOTAL, CKMB, CKMBINDEX, TROPONINI in the last 168 hours. BNP (last 3 results) No results for input(s): PROBNP in the last 8760 hours. HbA1C: No results for input(s): HGBA1C in the last 72 hours. CBG:  Recent Labs Lab  02/20/17 1641  GLUCAP 105*   Lipid Profile: No results for input(s): CHOL, HDL, LDLCALC, TRIG, CHOLHDL, LDLDIRECT in the last 72 hours. Thyroid Function Tests: No results for input(s): TSH, T4TOTAL, FREET4, T3FREE, THYROIDAB in the last 72 hours. Anemia Panel: No results for input(s): VITAMINB12, FOLATE, FERRITIN, TIBC, IRON, RETICCTPCT in the last 72 hours. Sepsis Labs:  Recent Labs Lab 02/20/17 1616 02/20/17 1905  LATICACIDVEN 5.59* 3.05*    Recent Results (from the past 240 hour(s))  Blood Culture (routine x 2)     Status: None (Preliminary result)   Collection Time: 02/20/17  4:30 PM  Result Value Ref Range Status   Specimen Description BLOOD RIGHT FOREARM  Final   Special Requests   Final    BOTTLES DRAWN AEROBIC AND ANAEROBIC Blood Culture adequate volume   Culture NO GROWTH < 24 HOURS  Final   Report Status PENDING  Incomplete  Blood Culture (routine x 2)     Status: None (Preliminary result)   Collection Time: 02/20/17  6:30 PM  Result Value Ref Range Status   Specimen Description BLOOD LEFT FOREARM  Final   Special Requests   Final    BOTTLES DRAWN AEROBIC AND ANAEROBIC Blood Culture adequate volume   Culture NO GROWTH < 24 HOURS  Final   Report Status PENDING  Incomplete         Radiology Studies: Ct Head Wo Contrast  Result Date: 02/20/2017 CLINICAL DATA:  Multiple falls with head injury EXAM: CT HEAD WITHOUT CONTRAST TECHNIQUE: Contiguous axial images were obtained from the base of the skull through the vertex without intravenous contrast. COMPARISON:  None. FINDINGS: Brain: No definite acute territorial infarction, hemorrhage or focal mass lesion is visualized. Mild periventricular white matter hypodensity suspect secondary to small vessel ischemic changes. Mild cortical atrophy. Slight ventricular prominence since, likely related to the presence of atrophy. Vascular: Slightly dense appearing proximal left MCA, suspect that this is secondary to vessel  tortuosity. Scattered calcifications at the carotid siphons. Skull: No fracture or suspicious bone lesion. Sinuses/Orbits: Minimal mucosal thickening in the maxillary and ethmoid sinuses. No acute orbital abnormality. Other: None IMPRESSION: No definite CT evidence for acute intracranial abnormality. Mild periventricular white matter small vessel ischemic changes. Electronically Signed   By: Jasmine Pang M.D.   On: 02/20/2017 16:30   Ct Abdomen Pelvis W Contrast  Result Date: 02/20/2017 CLINICAL DATA:  Initial evaluation for acute abdominal pain, fever, weakness, nausea. EXAM: CT ABDOMEN AND PELVIS WITH CONTRAST TECHNIQUE: Multidetector CT imaging of the abdomen and pelvis was performed using the standard protocol following bolus administration of intravenous contrast. CONTRAST:  ISOVUE-300 IOPAMIDOL (ISOVUE-300) INJECTION 61%  COMPARISON:  Prior CT from 06/04/2016. FINDINGS: Lower chest: Mild bibasilar atelectatic changes. Visualized lung bases are otherwise clear. Hepatobiliary: Diffuse hypoattenuation liver consistent with steatosis. Liver otherwise unremarkable. Gallbladder somewhat prominent. Minimal hazy pericholecystic hazy stranding/edema. No internal cholelithiasis. No biliary dilatation. Pancreas: Pancreas within normal limits. Spleen: Spleen within normal limits. Adrenals/Urinary Tract: Adrenal glands are normal. Kidneys equal in size with symmetric enhancement. No nephrolithiasis, hydronephrosis, or focal enhancing renal mass. No hydroureter. Bladder fairly distended without acute abnormality. Stomach/Bowel: Stomach within normal limits. No evidence for bowel obstruction appendix within normal limits. Sequelae of prior partial colectomy with surgical anastomosis within the sigmoid colon noted. No acute inflammatory changes seen about the bowels. Vascular/Lymphatic: Mild aorto bi-iliac atherosclerotic disease. No aneurysm. For opacification of the IMA proximally (series 3, image 47). Remaining  branch vessels are well opacified and within normal limits. No pathologically enlarged intra-abdominal or pelvic lymph nodes. Reproductive: Prostate mildly enlarged measuring 5.2 cm in transverse diameter, similar to previous. Other: No free air or fluid. Musculoskeletal: No acute osseous abnormality. No worrisome lytic or blastic osseous lesions. Scattered multilevel degenerative spondylolysis and facet arthrosis noted within the visualized spine. IMPRESSION: 1. Mildly prominent gallbladder with trace pericholecystic stranding/edema. Correlation with laboratory values for possible acute gallbladder pathology recommended. Additionally, further evaluation with dedicated right upper quadrant ultrasound could be performed for further evaluation as warranted. 2. Poor opacification of the proximal IMA. Findings of uncertain significance, and may be simply related to technique on this non angiographic examination. Correlation with physical exam and serum lactate recommended for potential signs of intestinal ischemia. No other secondary signs to suggest acute bowel ischemia identified on this exam. 3. Hepatic steatosis. 4. Mildly enlarged prostate. Prominent urinary bladder distension may reflect a degree of underlying outlet obstruction. Electronically Signed   By: Rise Mu M.D.   On: 02/20/2017 19:16   Dg Chest Port 1 View  Result Date: 02/20/2017 CLINICAL DATA:  Weakness, malaise and falls for 1 week. EXAM: PORTABLE CHEST 1 VIEW COMPARISON:  09/13/2016 and prior radiographs FINDINGS: The cardiomediastinal silhouette is unremarkable. There is no evidence of focal airspace disease, pulmonary edema, suspicious pulmonary nodule/mass, pleural effusion, or pneumothorax. No acute bony abnormalities are identified. IMPRESSION: No active disease. Electronically Signed   By: Harmon Pier M.D.   On: 02/20/2017 16:56    Scheduled Meds: . folic acid  1 mg Oral Daily  . heparin  5,000 Units Subcutaneous Q8H  .  multivitamin with minerals  1 tablet Oral Daily  . nicotine  21 mg Transdermal Daily  . thiamine  100 mg Oral Daily   Continuous Infusions:   LOS: 0 days    Time spent: >35 minutes  Penny Pia, MD [facility] Pager [phone]  If 7PM-7AM, please contact night-coverage [email or web address] Password [facility] 02/21/2017, 4:17 PM

## 2017-02-21 NOTE — Progress Notes (Signed)
Admitted pt.from ED;AAOX3,not in resp.distress noted but somewhat anxious.V/S taken & recorded.IV in place on RFA &LFA,no redness or swelling noted.Oriented pt.to the room,call bell.Fall assessment completed w/ pt.& able to verbalized understanding of riskd associated w/ falls & able to verbalized to call  The nurse before getting OOB.Call light with in reach Skin -  Assessment done w/ Toyin RN & noted abrasions on forehead,elbows,& knees & echymosis on arms & rt.hip .Placed pt.on heart monitor & verified with Manetta NT .Will cont.to monitor pt.

## 2017-02-22 DIAGNOSIS — I4891 Unspecified atrial fibrillation: Secondary | ICD-10-CM

## 2017-02-22 LAB — BASIC METABOLIC PANEL
ANION GAP: 8 (ref 5–15)
ANION GAP: 11 (ref 5–15)
Anion gap: 8 (ref 5–15)
Anion gap: 8 (ref 5–15)
BUN: 11 mg/dL (ref 6–20)
BUN: 10 mg/dL (ref 6–20)
BUN: 10 mg/dL (ref 6–20)
BUN: 9 mg/dL (ref 6–20)
CALCIUM: 8.2 mg/dL — AB (ref 8.9–10.3)
CALCIUM: 8.4 mg/dL — AB (ref 8.9–10.3)
CHLORIDE: 91 mmol/L — AB (ref 101–111)
CHLORIDE: 89 mmol/L — AB (ref 101–111)
CO2: 25 mmol/L (ref 22–32)
CO2: 25 mmol/L (ref 22–32)
CO2: 26 mmol/L (ref 22–32)
CO2: 27 mmol/L (ref 22–32)
CREATININE: 0.84 mg/dL (ref 0.61–1.24)
Calcium: 7.8 mg/dL — ABNORMAL LOW (ref 8.9–10.3)
Calcium: 8.2 mg/dL — ABNORMAL LOW (ref 8.9–10.3)
Chloride: 91 mmol/L — ABNORMAL LOW (ref 101–111)
Chloride: 89 mmol/L — ABNORMAL LOW (ref 101–111)
Creatinine, Ser: 0.73 mg/dL (ref 0.61–1.24)
Creatinine, Ser: 0.71 mg/dL (ref 0.61–1.24)
Creatinine, Ser: 0.83 mg/dL (ref 0.61–1.24)
GFR calc non Af Amer: >60 mL/min (ref >60)
GFR calc non Af Amer: >60 mL/min (ref >60)
Glucose, Bld: 105 mg/dL — ABNORMAL HIGH (ref 65–99)
Glucose, Bld: 99 mg/dL (ref 65–99)
Glucose, Bld: 132 mg/dL — ABNORMAL HIGH (ref 65–99)
Glucose, Bld: 106 mg/dL — ABNORMAL HIGH (ref 65–99)
Potassium: 3 mmol/L — ABNORMAL LOW (ref 3.5–5.1)
Potassium: 3.1 mmol/L — ABNORMAL LOW (ref 3.5–5.1)
Potassium: 3.2 mmol/L — ABNORMAL LOW (ref 3.5–5.1)
Potassium: 3.3 mmol/L — ABNORMAL LOW (ref 3.5–5.1)
SODIUM: 124 mmol/L — AB (ref 135–145)
SODIUM: 124 mmol/L — AB (ref 135–145)
Sodium: 125 mmol/L — ABNORMAL LOW (ref 135–145)
Sodium: 125 mmol/L — ABNORMAL LOW (ref 135–145)

## 2017-02-22 MED ORDER — POTASSIUM CHLORIDE CRYS ER 20 MEQ PO TBCR
40.0000 meq | EXTENDED_RELEASE_TABLET | Freq: Once | ORAL | Status: AC
  Administered 2017-02-22: 40 meq via ORAL
  Filled 2017-02-22: qty 2

## 2017-02-22 MED ORDER — NICOTINE 21 MG/24HR TD PT24
21.0000 mg | MEDICATED_PATCH | Freq: Every day | TRANSDERMAL | Status: DC
  Administered 2017-02-22 – 2017-02-23 (×2): 21 mg via TRANSDERMAL
  Filled 2017-02-22 (×2): qty 1

## 2017-02-22 MED ORDER — SODIUM CHLORIDE 1 G PO TABS
1.0000 g | ORAL_TABLET | Freq: Once | ORAL | Status: AC
  Administered 2017-02-22: 1 g via ORAL
  Filled 2017-02-22: qty 1

## 2017-02-22 NOTE — Progress Notes (Signed)
PROGRESS NOTE    Alvin Benton  ZOX:[id number] DOB: [birth year] DOA: 02/20/2017 PCP: Marilynn Rail, MD    Brief Narrative:  Alvin Benton is a 66 y.o. male with medical history significant of alcohol abuse, tobacco abuse, essential hypertension. 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 Pt has afib on chart but he denies having atrial fibrillation and is not on any rate controlling agents or anticoagulation  Pt admitted with nausea and emesis, hypokalemia, and profound hyponatremia  Assessment & Plan:   Hyponatremia - improving with improved oral intake - pt complaining of dizziness. Suspect this may be related. Will treat with salt tablet x 1 today. Reassess bmp. - trending up - most likely due to alcohol use and poor oral solute intake  Active Problems:   Tobacco use disorder - nicotine patch    Alcohol abuse -  CIWA protocol    Atrial fibrillation (HCC) - listed on problem list. CV strip showing sinus rhythm - will d/c telemetry    Essential hypertension   Hypokalemia - K replacement. Reassess bmp - kdur 40 meq today    Dehydration with hyponatremia - Liberalized diet   DVT prophylaxis:  Heparin Code Status: Full Family Communication: None at bedside Disposition Plan: Pending improvement in condition. Most likely DC next a.m.   Consultants:   None   Procedures: None   Antimicrobials: None   Subjective: The patient has no new complaints. No acute issues overnight  Objective: Vitals:   02/22/17 0000 02/22/17 0128 02/22/17 0549 02/22/17 1200  BP:   (!) 126/93 (!) 151/62  Pulse: 70 76 77 72  Resp:   18 17  Temp:   97.9 F (36.6 C) 97.8 F (36.6 C)  TempSrc:   Oral Oral  SpO2:   100% 100%  Weight:      Height:        Intake/Output Summary (Last 24 hours) at 02/22/17 1441 Last data filed at 02/22/17 0846  Gross per 24 hour  Intake             1970 ml  Output             2300 ml   Net             -330 ml   Filed Weights   02/20/17 1635 02/20/17 2052  Weight: 76.7 kg (169 lb) 76.8 kg (169 lb 5 oz)    Examination:  General exam: Appears calm and comfortable, in nad. Respiratory system: Clear to auscultation. Respiratory effort normal. Cardiovascular system: S1 & S2 heard, RRR. No JVD, murmurs, rubs, gallops or clicks. No pedal edema. Gastrointestinal system: Abdomen is nondistended, soft and nontender. No organomegaly or masses felt. Normal bowel sounds heard. Central nervous system: Alert and oriented. No focal neurological deficits. Extremities: Symmetric 5 x 5 power. Skin: No rashes, lesions or ulcers Psychiatry: Judgement and insight appear normal. Mood & affect appropriate.   Data Reviewed: I have personally reviewed following labs and imaging studies  CBC:  Recent Labs Lab 02/20/17 1600 02/20/17 2117 02/21/17 0557  WBC 14.9* 10.9* 9.6  HGB 17.3* 13.9 13.2  HCT 44.6 36.8* 35.9*  MCV 85.0 85.8 86.9  PLT 549* 393 361   Basic Metabolic Panel:  Recent Labs Lab 02/20/17 2117  02/21/17 1126 02/21/17 1634 02/21/17 2045 02/22/17 0221 02/22/17 0802  NA 118*  < > 123* 122* 123* 124* 125*  K 2.7*  < > 3.3* 2.7* 3.2* 3.0* 3.1*  CL 78*  < >  85* 86* 88* 91* 89*  CO2 27  < > GLUCOSE 91  < > 111* 103* 110* 105* 99  BUN 14  < > CREATININE 1.25*  < > 0.92 0.91 0.76 0.73 0.71  CALCIUM 8.1*  < > 8.1* 8.3* 8.3* 7.8* 8.2*  MG 1.5*  --   --   --   --   --   --   PHOS 1.9*  --   --   --   --   --   --   < > = values in this interval not displayed. GFR: Estimated Creatinine Clearance: 91 mL/min (by C-G formula based on SCr of 0.71 mg/dL). Liver Function Tests:  Recent Labs Lab 02/20/17 1600  AST 76*  ALT 36  ALKPHOS 57  BILITOT 1.5*  PROT 7.0  ALBUMIN 3.9    Recent Labs Lab 02/20/17 1848  LIPASE 29   No results for input(s): AMMONIA in the last 168 hours. Coagulation Profile: No results for input(s): INR,  PROTIME in the last 168 hours. Cardiac Enzymes: No results for input(s): CKTOTAL, CKMB, CKMBINDEX, TROPONINI in the last 168 hours. BNP (last 3 results) No results for input(s): PROBNP in the last 8760 hours. HbA1C: No results for input(s): HGBA1C in the last 72 hours. CBG:  Recent Labs Lab 02/20/17 1641  GLUCAP 105*   Lipid Profile: No results for input(s): CHOL, HDL, LDLCALC, TRIG, CHOLHDL, LDLDIRECT in the last 72 hours. Thyroid Function Tests: No results for input(s): TSH, T4TOTAL, FREET4, T3FREE, THYROIDAB in the last 72 hours. Anemia Panel: No results for input(s): VITAMINB12, FOLATE, FERRITIN, TIBC, IRON, RETICCTPCT in the last 72 hours. Sepsis Labs:  Recent Labs Lab 02/20/17 1616 02/20/17 1905  LATICACIDVEN 5.59* 3.05*    Recent Results (from the past 240 hour(s))  Blood Culture (routine x 2)     Status: None (Preliminary result)   Collection Time: 02/20/17  4:30 PM  Result Value Ref Range Status   Specimen Description BLOOD RIGHT FOREARM  Final   Special Requests   Final    BOTTLES DRAWN AEROBIC AND ANAEROBIC Blood Culture adequate volume   Culture NO GROWTH 2 DAYS  Final   Report Status PENDING  Incomplete  Blood Culture (routine x 2)     Status: None (Preliminary result)   Collection Time: 02/20/17  6:30 PM  Result Value Ref Range Status   Specimen Description BLOOD LEFT FOREARM  Final   Special Requests   Final    BOTTLES DRAWN AEROBIC AND ANAEROBIC Blood Culture adequate volume   Culture NO GROWTH 2 DAYS  Final   Report Status PENDING  Incomplete         Radiology Studies: Ct Head Wo Contrast  Result Date: 02/20/2017 CLINICAL DATA:  Multiple falls with head injury EXAM: CT HEAD WITHOUT CONTRAST TECHNIQUE: Contiguous axial images were obtained from the base of the skull through the vertex without intravenous contrast. COMPARISON:  None. FINDINGS: Brain: No definite acute territorial infarction, hemorrhage or focal mass lesion is visualized. Mild  periventricular white matter hypodensity suspect secondary to small vessel ischemic changes. Mild cortical atrophy. Slight ventricular prominence since, likely related to the presence of atrophy. Vascular: Slightly dense appearing proximal left MCA, suspect that this is secondary to vessel tortuosity. Scattered calcifications at the carotid siphons. Skull: No fracture or suspicious bone lesion. Sinuses/Orbits: Minimal mucosal thickening in the maxillary and ethmoid sinuses. No acute orbital abnormality. Other: None IMPRESSION:  No definite CT evidence for acute intracranial abnormality. Mild periventricular white matter small vessel ischemic changes. Electronically Signed   By: Jasmine Pang M.D.   On: 02/20/2017 16:30   Ct Abdomen Pelvis W Contrast  Result Date: 02/20/2017 CLINICAL DATA:  Initial evaluation for acute abdominal pain, fever, weakness, nausea. EXAM: CT ABDOMEN AND PELVIS WITH CONTRAST TECHNIQUE: Multidetector CT imaging of the abdomen and pelvis was performed using the standard protocol following bolus administration of intravenous contrast. CONTRAST:  ISOVUE-300 IOPAMIDOL (ISOVUE-300) INJECTION 61% COMPARISON:  Prior CT from 06/04/2016. FINDINGS: Lower chest: Mild bibasilar atelectatic changes. Visualized lung bases are otherwise clear. Hepatobiliary: Diffuse hypoattenuation liver consistent with steatosis. Liver otherwise unremarkable. Gallbladder somewhat prominent. Minimal hazy pericholecystic hazy stranding/edema. No internal cholelithiasis. No biliary dilatation. Pancreas: Pancreas within normal limits. Spleen: Spleen within normal limits. Adrenals/Urinary Tract: Adrenal glands are normal. Kidneys equal in size with symmetric enhancement. No nephrolithiasis, hydronephrosis, or focal enhancing renal mass. No hydroureter. Bladder fairly distended without acute abnormality. Stomach/Bowel: Stomach within normal limits. No evidence for bowel obstruction appendix within normal limits.  Sequelae of prior partial colectomy with surgical anastomosis within the sigmoid colon noted. No acute inflammatory changes seen about the bowels. Vascular/Lymphatic: Mild aorto bi-iliac atherosclerotic disease. No aneurysm. For opacification of the IMA proximally (series 3, image 47). Remaining branch vessels are well opacified and within normal limits. No pathologically enlarged intra-abdominal or pelvic lymph nodes. Reproductive: Prostate mildly enlarged measuring 5.2 cm in transverse diameter, similar to previous. Other: No free air or fluid. Musculoskeletal: No acute osseous abnormality. No worrisome lytic or blastic osseous lesions. Scattered multilevel degenerative spondylolysis and facet arthrosis noted within the visualized spine. IMPRESSION: 1. Mildly prominent gallbladder with trace pericholecystic stranding/edema. Correlation with laboratory values for possible acute gallbladder pathology recommended. Additionally, further evaluation with dedicated right upper quadrant ultrasound could be performed for further evaluation as warranted. 2. Poor opacification of the proximal IMA. Findings of uncertain significance, and may be simply related to technique on this non angiographic examination. Correlation with physical exam and serum lactate recommended for potential signs of intestinal ischemia. No other secondary signs to suggest acute bowel ischemia identified on this exam. 3. Hepatic steatosis. 4. Mildly enlarged prostate. Prominent urinary bladder distension may reflect a degree of underlying outlet obstruction. Electronically Signed   By: Rise Mu M.D.   On: 02/20/2017 19:16   Dg Chest Port 1 View  Result Date: 02/20/2017 CLINICAL DATA:  Weakness, malaise and falls for 1 week. EXAM: PORTABLE CHEST 1 VIEW COMPARISON:  09/13/2016 and prior radiographs FINDINGS: The cardiomediastinal silhouette is unremarkable. There is no evidence of focal airspace disease, pulmonary edema, suspicious  pulmonary nodule/mass, pleural effusion, or pneumothorax. No acute bony abnormalities are identified. IMPRESSION: No active disease. Electronically Signed   By: Harmon Pier M.D.   On: 02/20/2017 16:56    Scheduled Meds: . folic acid  1 mg Oral Daily  . heparin  5,000 Units Subcutaneous Q8H  . multivitamin with minerals  1 tablet Oral Daily  . nicotine  21 mg Transdermal Daily  . sodium chloride  1 g Oral Once  . thiamine  100 mg Oral Daily   Continuous Infusions:   LOS: 1 day    Time spent: >35 minutes  Penny Pia, MD [facility] Pager [phone]  If 7PM-7AM, please contact night-coverage [email or web address] Password [facility] 02/22/2017, 2:41 PM

## 2017-02-23 MED ORDER — CHLORDIAZEPOXIDE HCL 25 MG PO CAPS: 25.0000 mg | ORAL_CAPSULE | Freq: Three times a day (TID) | ORAL | 0 refills | Status: DC | PRN

## 2017-02-23 NOTE — Discharge Summary (Signed)
Physician Discharge Summary  Alvin Benton ZOX:[id number] DOB: April 04, 1951 DOA: 02/20/2017  PCP: Marilynn Rail, MD  Admit date: 02/20/2017 Discharge date: 02/23/2017  Time spent: > 35 minutes  Recommendations for Outpatient Follow-up:  1. Monitor sodium levels 2. Monitor potassium levels 3. Encourage alcohol cessation   Discharge Diagnoses:  Active Problems:   Tobacco use disorder   Alcohol abuse   Atrial fibrillation (HCC)   Essential hypertension   Hypokalemia   Dehydration with hyponatremia   Discharge Condition: stable  Diet recommendation: regular diet  Filed Weights   02/20/17 1635 02/20/17 2052  Weight: 76.7 kg (169 lb) 76.8 kg (169 lb 5 oz)    History of present illness:  Alvin Benton a 66 y.o.malewith medical history significant of alcohol abuse, tobacco abuse, essential hypertension. 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 Pt has afib on chart but he denies having atrial fibrillationand is not on any rate controlling agents or anticoagulation  Pt admitted with nausea and emesis, hypokalemia, and profound hyponatremia  Hospital Course:  Hyponatremia - improving with improved oral intake - I suspect this was secondary to his poor oral solute intake - dizziness resolved. Will recommend reassessing sodium levels within the next 5 days  Active Problems:   Tobacco use disorder - nicotine patch    Alcohol abuse -  CIWA protocol while patient was inhouse - d/c on librium (short course since patient already had 3 days on the CIWA protocol in house)    Atrial fibrillation (HCC) - listed on problem list. CV strip showing sinus rhythm - will d/c telemetry - recommend pcp f/u with this maybe consider holter monitoring.    Essential hypertension   Hypokalemia - K replacement. While in house - recommend reassessing within 5 days of d/c    Dehydration with hyponatremia - Liberalized  diet and saw improvement in condition   Procedures:  None  Consultations:  None  Discharge Exam: Vitals:   02/23/17 0448 02/23/17 0640  BP: (!) 164/74 (!) 168/74  Pulse: 71 70  Resp: 17   Temp: 98.3 F (36.8 C)     General: Pt in nad, alert and awake Cardiovascular: rrr, no rubs Respiratory: no increased wob, now wheezes  Discharge Instructions   Discharge Instructions    Call MD for:  extreme fatigue    Complete by:  As directed    Call MD for:  temperature >100.4    Complete by:  As directed    Diet - low sodium heart healthy    Complete by:  As directed    Discharge instructions    Complete by:  As directed    Abstain from drinking alcohol. Follow up with your pcp within the next 5 days after hospital discharge.   Increase activity slowly    Complete by:  As directed      Current Discharge Medication List    START taking these medications   Details  chlordiazePOXIDE (LIBRIUM) 25 MG capsule Take 1 capsule (25 mg total) by mouth 3 (three) times daily as needed for anxiety. Qty: 15 capsule, Refills: 0      CONTINUE these medications which have NOT CHANGED   Details  acetaminophen (TYLENOL) 325 MG tablet Take 1-2 tablets (325-650 mg total) by mouth every 6 (six) hours as needed for fever, headache, mild pain or moderate pain.    albuterol (PROVENTIL HFA;VENTOLIN HFA) 108 (90 BASE) MCG/ACT inhaler Inhale 2 puffs into the lungs every 6 (six) hours as  needed for wheezing or shortness of breath. Qty: 1 Inhaler, Refills: 2    hydrochlorothiazide (HYDRODIURIL) 25 MG tablet Take 25 mg by mouth daily.    Multiple Vitamin (MULTIVITAMIN WITH MINERALS) TABS tablet Take 1 tablet by mouth daily.    valsartan (DIOVAN) 160 MG tablet Take 160 mg by mouth daily.    oxyCODONE (OXY IR/ROXICODONE) 5 MG immediate release tablet Take 1-2 tablets (5-10 mg total) by mouth every 4 (four) hours as needed for moderate pain, severe pain or breakthrough pain. Qty: 30 tablet,  Refills: 0    potassium chloride SA (K-DUR,KLOR-CON) 20 MEQ tablet Take 2 tablets (40 mEq total) by mouth 2 (two) times daily. Qty: 40 tablet, Refills: 0      STOP taking these medications     ibuprofen (ADVIL,MOTRIN) 400 MG tablet        No Known Allergies    The results of significant diagnostics from this hospitalization (including imaging, microbiology, ancillary and laboratory) are listed below for reference.    Significant Diagnostic Studies: Ct Head Wo Contrast  Result Date: 02/20/2017 CLINICAL DATA:  Multiple falls with head injury EXAM: CT HEAD WITHOUT CONTRAST TECHNIQUE: Contiguous axial images were obtained from the base of the skull through the vertex without intravenous contrast. COMPARISON:  None. FINDINGS: Brain: No definite acute territorial infarction, hemorrhage or focal mass lesion is visualized. Mild periventricular white matter hypodensity suspect secondary to small vessel ischemic changes. Mild cortical atrophy. Slight ventricular prominence since, likely related to the presence of atrophy. Vascular: Slightly dense appearing proximal left MCA, suspect that this is secondary to vessel tortuosity. Scattered calcifications at the carotid siphons. Skull: No fracture or suspicious bone lesion. Sinuses/Orbits: Minimal mucosal thickening in the maxillary and ethmoid sinuses. No acute orbital abnormality. Other: None IMPRESSION: No definite CT evidence for acute intracranial abnormality. Mild periventricular white matter small vessel ischemic changes. Electronically Signed   By: Jasmine Pang M.D.   On: 02/20/2017 16:30   Ct Abdomen Pelvis W Contrast  Result Date: 02/20/2017 CLINICAL DATA:  Initial evaluation for acute abdominal pain, fever, weakness, nausea. EXAM: CT ABDOMEN AND PELVIS WITH CONTRAST TECHNIQUE: Multidetector CT imaging of the abdomen and pelvis was performed using the standard protocol following bolus administration of intravenous contrast. CONTRAST:   ISOVUE-300 IOPAMIDOL (ISOVUE-300) INJECTION 61% COMPARISON:  Prior CT from 06/04/2016. FINDINGS: Lower chest: Mild bibasilar atelectatic changes. Visualized lung bases are otherwise clear. Hepatobiliary: Diffuse hypoattenuation liver consistent with steatosis. Liver otherwise unremarkable. Gallbladder somewhat prominent. Minimal hazy pericholecystic hazy stranding/edema. No internal cholelithiasis. No biliary dilatation. Pancreas: Pancreas within normal limits. Spleen: Spleen within normal limits. Adrenals/Urinary Tract: Adrenal glands are normal. Kidneys equal in size with symmetric enhancement. No nephrolithiasis, hydronephrosis, or focal enhancing renal mass. No hydroureter. Bladder fairly distended without acute abnormality. Stomach/Bowel: Stomach within normal limits. No evidence for bowel obstruction appendix within normal limits. Sequelae of prior partial colectomy with surgical anastomosis within the sigmoid colon noted. No acute inflammatory changes seen about the bowels. Vascular/Lymphatic: Mild aorto bi-iliac atherosclerotic disease. No aneurysm. For opacification of the IMA proximally (series 3, image 47). Remaining branch vessels are well opacified and within normal limits. No pathologically enlarged intra-abdominal or pelvic lymph nodes. Reproductive: Prostate mildly enlarged measuring 5.2 cm in transverse diameter, similar to previous. Other: No free air or fluid. Musculoskeletal: No acute osseous abnormality. No worrisome lytic or blastic osseous lesions. Scattered multilevel degenerative spondylolysis and facet arthrosis noted within the visualized spine. IMPRESSION: 1. Mildly prominent gallbladder with trace pericholecystic stranding/edema.  Correlation with laboratory values for possible acute gallbladder pathology recommended. Additionally, further evaluation with dedicated right upper quadrant ultrasound could be performed for further evaluation as warranted. 2. Poor opacification of the proximal  IMA. Findings of uncertain significance, and may be simply related to technique on this non angiographic examination. Correlation with physical exam and serum lactate recommended for potential signs of intestinal ischemia. No other secondary signs to suggest acute bowel ischemia identified on this exam. 3. Hepatic steatosis. 4. Mildly enlarged prostate. Prominent urinary bladder distension may reflect a degree of underlying outlet obstruction. Electronically Signed   By: Rise Mu M.D.   On: 02/20/2017 19:16   Dg Chest Port 1 View  Result Date: 02/20/2017 CLINICAL DATA:  Weakness, malaise and falls for 1 week. EXAM: PORTABLE CHEST 1 VIEW COMPARISON:  09/13/2016 and prior radiographs FINDINGS: The cardiomediastinal silhouette is unremarkable. There is no evidence of focal airspace disease, pulmonary edema, suspicious pulmonary nodule/mass, pleural effusion, or pneumothorax. No acute bony abnormalities are identified. IMPRESSION: No active disease. Electronically Signed   By: Harmon Pier M.D.   On: 02/20/2017 16:56    Microbiology: Recent Results (from the past 240 hour(s))  Blood Culture (routine x 2)     Status: None (Preliminary result)   Collection Time: 02/20/17  4:30 PM  Result Value Ref Range Status   Specimen Description BLOOD RIGHT FOREARM  Final   Special Requests   Final    BOTTLES DRAWN AEROBIC AND ANAEROBIC Blood Culture adequate volume   Culture NO GROWTH 2 DAYS  Final   Report Status PENDING  Incomplete  Blood Culture (routine x 2)     Status: None (Preliminary result)   Collection Time: 02/20/17  6:30 PM  Result Value Ref Range Status   Specimen Description BLOOD LEFT FOREARM  Final   Special Requests   Final    BOTTLES DRAWN AEROBIC AND ANAEROBIC Blood Culture adequate volume   Culture NO GROWTH 2 DAYS  Final   Report Status PENDING  Incomplete     Labs: Basic Metabolic Panel:  Recent Labs Lab 02/20/17 2117  02/21/17 2045 02/22/17 0221 02/22/17 0802  02/22/17 1417 02/22/17 2006  NA 118*  < > 123* 124* 125* 125* 124*  K 2.7*  < > 3.2* 3.0* 3.1* 3.2* 3.3*  CL 78*  < > 88* 91* 89* 91* 89*  CO2 27  < > GLUCOSE 91  < > 110* 105* 99 132* 106*  BUN 14  < > CREATININE 1.25*  < > 0.76 0.73 0.71 0.84 0.83  CALCIUM 8.1*  < > 8.3* 7.8* 8.2* 8.2* 8.4*  MG 1.5*  --   --   --   --   --   --   PHOS 1.9*  --   --   --   --   --   --   < > = values in this interval not displayed. Liver Function Tests:  Recent Labs Lab 02/20/17 1600  AST 76*  ALT 36  ALKPHOS 57  BILITOT 1.5*  PROT 7.0  ALBUMIN 3.9    Recent Labs Lab 02/20/17 1848  LIPASE 29   No results for input(s): AMMONIA in the last 168 hours. CBC:  Recent Labs Lab 02/20/17 1600 02/20/17 2117 02/21/17 0557  WBC 14.9* 10.9* 9.6  HGB 17.3* 13.9 13.2  HCT 44.6 36.8* 35.9*  MCV 85.0 85.8 86.9  PLT 549* 393 361   Cardiac  Enzymes: No results for input(s): CKTOTAL, CKMB, CKMBINDEX, TROPONINI in the last 168 hours. BNP: BNP (last 3 results) No results for input(s): BNP in the last 8760 hours.  ProBNP (last 3 results) No results for input(s): PROBNP in the last 8760 hours.  CBG:  Recent Labs Lab 02/20/17 1641  GLUCAP 105*    Signed:  Penny Pia MD.  [facility] 02/23/2017, 12:09 PM

## 2017-02-23 NOTE — Progress Notes (Signed)
Alvin Benton to be D/C'd Home per MD order.  Discussed with the patient and all questions fully answered.  VSS, Skin clean, dry and intact without evidence of skin break down, no evidence of skin tears noted. IV catheter discontinued intact. Site without signs and symptoms of complications. Dressing and pressure applied.  An After Visit Summary was printed and given to the patient. Patient received prescription.  D/c education completed with patient/family including follow up instructions, medication list, d/c activities limitations if indicated, with other d/c instructions as indicated by MD - patient able to verbalize understanding, all questions fully answered.   Patient instructed to return to ED, call 911, or call MD for any changes in condition.   Patient escorted via WC, and D/C home via private auto.  Alvin Benton 02/23/2017 12:59 PM

## 2017-02-25 LAB — CULTURE, BLOOD (ROUTINE X 2)
Culture: NO GROWTH
Culture: NO GROWTH
SPECIAL REQUESTS: ADEQUATE
SPECIAL REQUESTS: ADEQUATE

## 2017-02-26 ENCOUNTER — Emergency Department (HOSPITAL_COMMUNITY): Payer: Managed Care, Other (non HMO)

## 2017-02-26 ENCOUNTER — Inpatient Hospital Stay (HOSPITAL_COMMUNITY)
Admission: EM | Admit: 2017-02-26 | Discharge: 2017-03-03 | DRG: 563 | Disposition: A | Discharge status: Skilled Nursing Facility | Payer: Managed Care, Other (non HMO) | Source: Ambulatory Visit | Attending: Internal Medicine | Admitting: Internal Medicine

## 2017-02-26 ENCOUNTER — Inpatient Hospital Stay (HOSPITAL_COMMUNITY): Payer: Managed Care, Other (non HMO)

## 2017-02-26 ENCOUNTER — Other Ambulatory Visit: Payer: Self-pay | Admitting: *Deleted

## 2017-02-26 ENCOUNTER — Encounter (HOSPITAL_COMMUNITY): Payer: Self-pay

## 2017-02-26 DIAGNOSIS — Z8679 Personal history of other diseases of the circulatory system: Secondary | ICD-10-CM | POA: Diagnosis not present

## 2017-02-26 DIAGNOSIS — G43909 Migraine, unspecified, not intractable, without status migrainosus: Secondary | ICD-10-CM | POA: Diagnosis present

## 2017-02-26 DIAGNOSIS — Z7982 Long term (current) use of aspirin: Secondary | ICD-10-CM

## 2017-02-26 DIAGNOSIS — J9811 Atelectasis: Secondary | ICD-10-CM | POA: Diagnosis present

## 2017-02-26 DIAGNOSIS — K589 Irritable bowel syndrome without diarrhea: Secondary | ICD-10-CM | POA: Diagnosis present

## 2017-02-26 DIAGNOSIS — F10129 Alcohol abuse with intoxication, unspecified: Secondary | ICD-10-CM | POA: Diagnosis present

## 2017-02-26 DIAGNOSIS — S4381XA Sprain of other specified parts of right shoulder girdle, initial encounter: Secondary | ICD-10-CM | POA: Diagnosis present

## 2017-02-26 DIAGNOSIS — M25519 Pain in unspecified shoulder: Secondary | ICD-10-CM

## 2017-02-26 DIAGNOSIS — W109XXA Fall (on) (from) unspecified stairs and steps, initial encounter: Secondary | ICD-10-CM | POA: Diagnosis present

## 2017-02-26 DIAGNOSIS — I10 Essential (primary) hypertension: Secondary | ICD-10-CM | POA: Diagnosis present

## 2017-02-26 DIAGNOSIS — I480 Paroxysmal atrial fibrillation: Secondary | ICD-10-CM | POA: Diagnosis present

## 2017-02-26 DIAGNOSIS — F17200 Nicotine dependence, unspecified, uncomplicated: Secondary | ICD-10-CM | POA: Diagnosis not present

## 2017-02-26 DIAGNOSIS — J90 Pleural effusion, not elsewhere classified: Secondary | ICD-10-CM | POA: Diagnosis present

## 2017-02-26 DIAGNOSIS — S301XXA Contusion of abdominal wall, initial encounter: Secondary | ICD-10-CM

## 2017-02-26 DIAGNOSIS — S0240EA Zygomatic fracture, right side, initial encounter for closed fracture: Secondary | ICD-10-CM | POA: Diagnosis present

## 2017-02-26 DIAGNOSIS — Y908 Blood alcohol level of 240 mg/100 ml or more: Secondary | ICD-10-CM | POA: Diagnosis present

## 2017-02-26 DIAGNOSIS — Y92009 Unspecified place in unspecified non-institutional (private) residence as the place of occurrence of the external cause: Secondary | ICD-10-CM

## 2017-02-26 DIAGNOSIS — R42 Dizziness and giddiness: Secondary | ICD-10-CM

## 2017-02-26 DIAGNOSIS — F1010 Alcohol abuse, uncomplicated: Secondary | ICD-10-CM

## 2017-02-26 DIAGNOSIS — F17210 Nicotine dependence, cigarettes, uncomplicated: Secondary | ICD-10-CM | POA: Diagnosis present

## 2017-02-26 DIAGNOSIS — R55 Syncope and collapse: Secondary | ICD-10-CM

## 2017-02-26 DIAGNOSIS — S2249XA Multiple fractures of ribs, unspecified side, initial encounter for closed fracture: Secondary | ICD-10-CM

## 2017-02-26 DIAGNOSIS — E871 Hypo-osmolality and hyponatremia: Secondary | ICD-10-CM | POA: Diagnosis not present

## 2017-02-26 DIAGNOSIS — Z79899 Other long term (current) drug therapy: Secondary | ICD-10-CM

## 2017-02-26 DIAGNOSIS — J810 Acute pulmonary edema: Secondary | ICD-10-CM

## 2017-02-26 DIAGNOSIS — S2241XA Multiple fractures of ribs, right side, initial encounter for closed fracture: Secondary | ICD-10-CM | POA: Diagnosis present

## 2017-02-26 DIAGNOSIS — J811 Chronic pulmonary edema: Secondary | ICD-10-CM

## 2017-02-26 DIAGNOSIS — M549 Dorsalgia, unspecified: Secondary | ICD-10-CM

## 2017-02-26 DIAGNOSIS — R296 Repeated falls: Secondary | ICD-10-CM | POA: Diagnosis present

## 2017-02-26 DIAGNOSIS — K219 Gastro-esophageal reflux disease without esophagitis: Secondary | ICD-10-CM | POA: Diagnosis present

## 2017-02-26 DIAGNOSIS — S43101A Unspecified dislocation of right acromioclavicular joint, initial encounter: Principal | ICD-10-CM | POA: Diagnosis present

## 2017-02-26 DIAGNOSIS — M25511 Pain in right shoulder: Secondary | ICD-10-CM

## 2017-02-26 DIAGNOSIS — I340 Nonrheumatic mitral (valve) insufficiency: Secondary | ICD-10-CM | POA: Diagnosis not present

## 2017-02-26 DIAGNOSIS — W19XXXA Unspecified fall, initial encounter: Secondary | ICD-10-CM

## 2017-02-26 LAB — COMPREHENSIVE METABOLIC PANEL
ALK PHOS: 41 U/L (ref 38–126)
ALT: 44 U/L (ref 17–63)
AST: 52 U/L — AB (ref 15–41)
Albumin: 3.1 g/dL — ABNORMAL LOW (ref 3.5–5.0)
Anion gap: 8 (ref 5–15)
BILIRUBIN TOTAL: 0.2 mg/dL — AB (ref 0.3–1.2)
BUN: 6 mg/dL (ref 6–20)
CALCIUM: 9.1 mg/dL (ref 8.9–10.3)
CHLORIDE: 103 mmol/L (ref 101–111)
CO2: 26 mmol/L (ref 22–32)
CREATININE: 0.81 mg/dL (ref 0.61–1.24)
Glucose, Bld: 101 mg/dL — ABNORMAL HIGH (ref 65–99)
Potassium: 3.7 mmol/L (ref 3.5–5.1)
Sodium: 137 mmol/L (ref 135–145)
Total Protein: 5.5 g/dL — ABNORMAL LOW (ref 6.5–8.1)

## 2017-02-26 LAB — CBC WITH DIFFERENTIAL/PLATELET
BASOS PCT: 1 %
Basophils Absolute: 0.1 10*3/uL (ref 0.0–0.1)
EOS ABS: 0.1 10*3/uL (ref 0.0–0.7)
EOS PCT: 1 %
HCT: 34.6 % — ABNORMAL LOW (ref 39.0–52.0)
HEMOGLOBIN: 12 g/dL — AB (ref 13.0–17.0)
LYMPHS ABS: 1.3 10*3/uL (ref 0.7–4.0)
Lymphocytes Relative: 13 %
MCH: 32.4 pg (ref 26.0–34.0)
MCHC: 34.7 g/dL (ref 30.0–36.0)
MCV: 93.5 fL (ref 78.0–100.0)
MONOS PCT: 8 %
Monocytes Absolute: 0.8 10*3/uL (ref 0.1–1.0)
NEUTROS PCT: 77 %
Neutro Abs: 7.9 10*3/uL — ABNORMAL HIGH (ref 1.7–7.7)
PLATELETS: 380 10*3/uL (ref 150–400)
RBC: 3.7 MIL/uL — ABNORMAL LOW (ref 4.22–5.81)
RDW: 13.1 % (ref 11.5–15.5)
WBC: 10.1 10*3/uL (ref 4.0–10.5)

## 2017-02-26 LAB — HEMOGLOBIN AND HEMATOCRIT, BLOOD
HCT: 36.5 % — ABNORMAL LOW (ref 39.0–52.0)
Hemoglobin: 12.5 g/dL — ABNORMAL LOW (ref 13.0–17.0)

## 2017-02-26 LAB — D-DIMER, QUANTITATIVE: D-Dimer, Quant: 11.4 ug/mL-FEU — ABNORMAL HIGH (ref 0.00–0.50)

## 2017-02-26 LAB — TROPONIN I: Troponin I: <0.03 ng/mL (ref <0.03)

## 2017-02-26 LAB — ETHANOL: Alcohol, Ethyl (B): 329 mg/dL (ref <5)

## 2017-02-26 LAB — PROCALCITONIN: Procalcitonin: <0.1 ng/mL

## 2017-02-26 LAB — BRAIN NATRIURETIC PEPTIDE: B NATRIURETIC PEPTIDE 5: 156.2 pg/mL — AB (ref 0.0–100.0)

## 2017-02-26 MED ORDER — FENTANYL CITRATE (PF) 100 MCG/2ML IJ SOLN
50.0000 ug | INTRAMUSCULAR | Status: DC | PRN
  Administered 2017-02-27 – 2017-02-28 (×5): 100 ug via INTRAVENOUS
  Filled 2017-02-26 (×5): qty 2

## 2017-02-26 MED ORDER — THIAMINE HCL 100 MG/ML IJ SOLN: 100.0000 mg | Freq: Every day | INTRAMUSCULAR | Status: DC

## 2017-02-26 MED ORDER — VITAMIN B-1 100 MG PO TABS
100.0000 mg | ORAL_TABLET | Freq: Every day | ORAL | Status: DC
  Administered 2017-02-26 – 2017-03-02 (×5): 100 mg via ORAL
  Filled 2017-02-26 (×5): qty 1

## 2017-02-26 MED ORDER — DEXTROSE-NACL 5-0.45 % IV SOLN
INTRAVENOUS | Status: DC
  Administered 2017-02-26: 20:00:00 via INTRAVENOUS

## 2017-02-26 MED ORDER — ACETAMINOPHEN 500 MG PO TABS
1000.0000 mg | ORAL_TABLET | Freq: Four times a day (QID) | ORAL | Status: DC | PRN
  Administered 2017-02-28 – 2017-03-03 (×7): 1000 mg via ORAL
  Filled 2017-02-26 (×8): qty 2

## 2017-02-26 MED ORDER — ASPIRIN 325 MG PO TABS
325.0000 mg | ORAL_TABLET | Freq: Every day | ORAL | Status: DC
  Administered 2017-02-27: 325 mg via ORAL
  Filled 2017-02-26: qty 1

## 2017-02-26 MED ORDER — FENTANYL CITRATE (PF) 100 MCG/2ML IJ SOLN
100.0000 ug | Freq: Once | INTRAMUSCULAR | Status: AC
Start: 2017-02-26 — End: 2017-02-26
  Administered 2017-02-26: 100 ug via INTRAVENOUS
  Filled 2017-02-26: qty 2

## 2017-02-26 MED ORDER — OXYCODONE HCL 5 MG PO TABS
5.0000 mg | ORAL_TABLET | ORAL | Status: DC | PRN
  Administered 2017-02-26 – 2017-03-03 (×18): 5 mg via ORAL
  Filled 2017-02-26 (×18): qty 1

## 2017-02-26 MED ORDER — FOLIC ACID 1 MG PO TABS
1.0000 mg | ORAL_TABLET | Freq: Every day | ORAL | Status: DC
  Administered 2017-02-26 – 2017-03-03 (×6): 1 mg via ORAL
  Filled 2017-02-26 (×6): qty 1

## 2017-02-26 MED ORDER — IOPAMIDOL (ISOVUE-370) INJECTION 76%
INTRAVENOUS | Status: AC
  Administered 2017-02-26: 100 mL
  Filled 2017-02-26: qty 100

## 2017-02-26 MED ORDER — FENTANYL CITRATE (PF) 100 MCG/2ML IJ SOLN
50.0000 ug | Freq: Once | INTRAMUSCULAR | Status: AC
  Administered 2017-02-26: 50 ug via INTRAVENOUS
  Filled 2017-02-26: qty 2

## 2017-02-26 MED ORDER — ALBUTEROL SULFATE (2.5 MG/3ML) 0.083% IN NEBU: 3.0000 mL | INHALATION_SOLUTION | RESPIRATORY_TRACT | Status: DC | PRN

## 2017-02-26 MED ORDER — LORAZEPAM 1 MG PO TABS
1.0000 mg | ORAL_TABLET | Freq: Four times a day (QID) | ORAL | Status: AC | PRN
  Administered 2017-02-27 – 2017-03-01 (×4): 1 mg via ORAL
  Filled 2017-02-26 (×4): qty 1

## 2017-02-26 MED ORDER — NICOTINE 14 MG/24HR TD PT24: 14.0000 mg | MEDICATED_PATCH | Freq: Every day | TRANSDERMAL | Status: DC | PRN

## 2017-02-26 MED ORDER — ONDANSETRON HCL 4 MG/2ML IJ SOLN: 4.0000 mg | Freq: Four times a day (QID) | INTRAMUSCULAR | Status: DC | PRN

## 2017-02-26 MED ORDER — HYDRALAZINE HCL 20 MG/ML IJ SOLN: 5.0000 mg | INTRAMUSCULAR | Status: DC | PRN

## 2017-02-26 MED ORDER — HYDROMORPHONE HCL 1 MG/ML IJ SOLN
0.5000 mg | Freq: Once | INTRAMUSCULAR | Status: AC
  Administered 2017-02-26: 0.5 mg via INTRAVENOUS
  Filled 2017-02-26: qty 1

## 2017-02-26 MED ORDER — LORAZEPAM 2 MG/ML IJ SOLN
1.0000 mg | Freq: Four times a day (QID) | INTRAMUSCULAR | Status: AC | PRN
  Administered 2017-02-26 – 2017-03-01 (×4): 1 mg via INTRAVENOUS
  Filled 2017-02-26 (×4): qty 1

## 2017-02-26 MED ORDER — ONDANSETRON HCL 4 MG PO TABS: 4.0000 mg | ORAL_TABLET | Freq: Four times a day (QID) | ORAL | Status: DC | PRN

## 2017-02-26 MED ORDER — ADULT MULTIVITAMIN W/MINERALS CH
1.0000 | ORAL_TABLET | Freq: Every day | ORAL | Status: DC
  Administered 2017-02-26 – 2017-03-03 (×6): 1 via ORAL
  Filled 2017-02-26 (×7): qty 1

## 2017-02-26 MED ORDER — KETOROLAC TROMETHAMINE 15 MG/ML IJ SOLN
15.0000 mg | Freq: Three times a day (TID) | INTRAMUSCULAR | Status: DC | PRN
  Administered 2017-02-26 – 2017-02-28 (×2): 15 mg via INTRAVENOUS
  Filled 2017-02-26 (×2): qty 1

## 2017-02-26 NOTE — ED Notes (Signed)
Patient transported to CT on telemetry monitor

## 2017-02-26 NOTE — Progress Notes (Signed)
Pt states he has no issues with small spaces and does not need anxiety medication for MRI.

## 2017-02-26 NOTE — ED Notes (Signed)
Pt returns from ct  

## 2017-02-26 NOTE — ED Notes (Signed)
Voids 600 cc to urinal.

## 2017-02-26 NOTE — ED Triage Notes (Signed)
Pt arrives EMS from home where he states he was leaving his second floor apartment for work and had chest pain while on stirs. He then woke up on ground and found by neighbor aT [phone]. Pt states he drinks daily to "chase the pain" in his chest but states this pain is different. Describes painas intermittent and non radiating heaviness. EMS gavwe 324 Aspirin and nitro sl x1 with relief of pain in truck but pain returned on arrival.

## 2017-02-26 NOTE — ED Notes (Signed)
Pt aopproached for discharge anxd then states he uis willing to stay and be admitted. Dr. Criss Alvine aware

## 2017-02-26 NOTE — ED Notes (Addendum)
PT requests that C Collar be removed. PT made aware that this cannot be removed until the physician reviews his CT results. PT made aware that I will bring this to MDs attention now. PT requests pain medicine. MD made aware

## 2017-02-26 NOTE — ED Provider Notes (Signed)
[facility]-EMER[facility]ENCY DEPT Provider Note   CSN: [id number] Arrival date & time: 02/26/17  0808     History   Chief Complaint Chief Complaint  Patient presents with  . Fall  . Chest Pain    HPI Alvin Benton is a 66 y.o. male.  HPI  66 year old male presents with a chief complaint of fall and chest pain. History is somewhat limited. Patient states that he was walking down the stairs in his apartment complex to go to work and then woke up on the floor. He thinks he passed out. He did not feel preceding dizziness or lightheadedness. A neighbor saw him and called EMS. Shortly after but not immediately after waking up he started feeling left-sided and mid chest pain. Has felt short of breath as well. Has a headache. He states he hurts "all over" but the chest and the headache are worst. Denies neck pain. He was recently admitted and discharged for hyponatremia. He has chronic alcohol abuse, states he had "a couple beers" this morning. He has been feeling generally weak since yesterday but no focal weakness.   Past Medical History:  Diagnosis Date  . Blood in stool   . Diverticulitis   . ETOH abuse   . GERD (gastroesophageal reflux disease)   . Heart murmur   . History of urinary tract infection   . Hypertension   . IBS (irritable bowel syndrome)   . Migraine    "none in a long time" (03/14/2016)  . Nocturia   . Pneumonia    childhood  . Seasonal allergies   . Tobacco abuse     Patient Active Problem List   Diagnosis Date Noted  . Dehydration with hyponatremia 02/20/2017  . Alcohol withdrawal (HCC) 06/04/2016  . Volume depletion 06/04/2016  . Hypokalemia 06/04/2016  . Diverticulitis of intestine with perforation without bleeding 06/04/2016  . Essential hypertension   . Hypomagnesemia   . Ileus (HCC) 05/05/2016  . Systolic murmur 05/05/2016  . Anxiety state 04/19/2016  . 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 Hypoxia 04/05/2016  . Atrial fibrillation (HCC) 04/05/2016  .  Diverticulitis of large intestine with perforation and abscess 03/14/2016  . Chronic constipation 03/08/2016  . Alcohol abuse 02/15/2016  . Tobacco use disorder 07/17/2015  . Allergic rhinitis 07/17/2015  . Reactive airway disease with wheezing 07/17/2015  . Overweight (BMI 25.0-29.9) 07/17/2015  . Hand pain, left 01/12/2014  . LLQ pain-chronic, intermittent and episodic 09/17/2013    Past Surgical History:  Procedure Laterality Date  . COLONOSCOPY    . CYSTOSCOPY WITH STENT PLACEMENT Left 10/03/2016   Procedure: CYSTOSCOPY WITH  LEFT URETERAL CATHETER PLACEMENT;  Surgeon: Bjorn Pippin, MD;  Location: [facility];  Service: Urology;  Laterality: Left;  . PROCTOSCOPY  10/03/2016   Procedure: PROCTOSCOPY;  Surgeon: Romie Levee, MD;  Location: [facility];  Service: General;;  . TONSILLECTOMY         Home Medications    Prior to Admission medications   Medication Sig Start Date End Date Taking? Authorizing Provider  acetaminophen (TYLENOL) 325 MG tablet Take 1-2 tablets (325-650 mg total) by mouth every 6 (six) hours as needed for fever, headache, mild pain or moderate pain. Patient taking differently: Take 325-650 mg by mouth every 6 (six) hours as needed (for pain).  10/07/16   Romie Levee, MD  albuterol (PROVENTIL HFA;VENTOLIN HFA) 108 (90 BASE) MCG/ACT inhaler Inhale 2 puffs into the lungs every 6 (six) hours as needed for wheezing or shortness of breath. Patient taking differently: Inhale  2 puffs into the lungs every 4 (four) hours as needed for wheezing or shortness of breath.  07/17/15   Sheliah Hatch, MD  chlordiazePOXIDE (LIBRIUM) 25 MG capsule Take 1 capsule (25 mg total) by mouth 3 (three) times daily as needed for anxiety. 02/23/17   Penny Pia, MD  hydrochlorothiazide (HYDRODIURIL) 25 MG tablet Take 25 mg by mouth daily. 02/03/17   Historical Provider, MD  Multiple Vitamin (MULTIVITAMIN WITH MINERALS) TABS tablet Take 1 tablet by mouth daily. 05/09/16   Rodolph Bong, MD    oxyCODONE (OXY IR/ROXICODONE) 5 MG immediate release tablet Take 1-2 tablets (5-10 mg total) by mouth every 4 (four) hours as needed for moderate pain, severe pain or breakthrough pain. Patient not taking: Reported on 02/20/2017 10/07/16   Romie Levee, MD  potassium chloride SA (K-DUR,KLOR-CON) 20 MEQ tablet Take 2 tablets (40 mEq total) by mouth 2 (two) times daily. Patient not taking: Reported on 02/20/2017 08/29/16   Benjiman Core, MD  valsartan (DIOVAN) 160 MG tablet Take 160 mg by mouth daily. 07/12/16   Historical Provider, MD    Family History Family History  Problem Relation Age of Onset  . Stroke Mother   . Colon cancer Neg Hx   . Esophageal cancer Neg Hx   . Rectal cancer Neg Hx   . Stomach cancer Neg Hx     Social History Social History  Substance Use Topics  . Smoking status: Current Every Day Smoker    Packs/day: 1.00    Years: 40.00    Types: Cigarettes  . Smokeless tobacco: Never Used  . Alcohol use 1.8 oz/week    3 Shots of liquor per week     Comment: pt states does not drink daily has beer with football     Allergies   Patient has no known allergies.   Review of Systems Review of Systems  Respiratory: Positive for shortness of breath.   Cardiovascular: Positive for chest pain.  Gastrointestinal: Negative for abdominal pain.  Neurological: Positive for syncope, weakness and headaches.  All other systems reviewed and are negative.    Physical Exam Updated Vital Signs SpO2 99% Comment: ra  Physical Exam  Constitutional: He is oriented to person, place, and time. He appears well-developed and well-nourished. Cervical collar in place.  Awake, alert. Responds slowly but otherwise appropriately  HENT:  Head: Normocephalic and atraumatic.  Right Ear: External ear normal.  Left Ear: External ear normal.  Nose: Nose normal.  Eyes: EOM are normal. Pupils are equal, round, and reactive to light. Right eye exhibits no discharge. Left eye exhibits no  discharge.  Neck: Neck supple. No spinous process tenderness and no muscular tenderness present.  Cardiovascular: Normal rate, regular rhythm and normal heart sounds.   Pulmonary/Chest: Effort normal and breath sounds normal. He exhibits no tenderness.  Abdominal: Soft. He exhibits no distension. There is no tenderness.  Musculoskeletal: He exhibits no edema.       Right hip: He exhibits normal range of motion.       Left hip: He exhibits normal range of motion.       Cervical back: He exhibits no tenderness.       Thoracic back: He exhibits tenderness.       Lumbar back: He exhibits no tenderness.  Neurological: He is alert and oriented to person, place, and time.  CN 3-12 grossly intact. Diffusely weak in all 4 extremities but symmetric. Grossly normal sensation. Normal finger to nose.   Skin: Skin  is warm and dry.  Nursing note and vitals reviewed.    ED Treatments / Results  Labs (all labs ordered are listed, but only abnormal results are displayed) Labs Reviewed  COMPREHENSIVE METABOLIC PANEL - Abnormal; Notable for the following:       Result Value   Glucose, Bld 101 (*)    Total Protein 5.5 (*)    Albumin 3.1 (*)    AST 52 (*)    Total Bilirubin 0.2 (*)    All other components within normal limits  ETHANOL - Abnormal; Notable for the following:    Alcohol, Ethyl (B) 329 (*)    All other components within normal limits  CBC WITH DIFFERENTIAL/PLATELET - Abnormal; Notable for the following:    RBC 3.70 (*)    Hemoglobin 12.0 (*)    HCT 34.6 (*)    Neutro Abs 7.9 (*)    All other components within normal limits  D-DIMER, QUANTITATIVE (NOT AT [facility]) - Abnormal; Notable for the following:    D-Dimer, Quant 11.40 (*)    All other components within normal limits  BRAIN NATRIURETIC PEPTIDE - Abnormal; Notable for the following:    B Natriuretic Peptide 156.2 (*)    All other components within normal limits  TROPONIN I  HEMOGLOBIN AND HEMATOCRIT, BLOOD  HEMOGLOBIN AND  HEMATOCRIT, BLOOD  PROCALCITONIN    EKG  EKG Interpretation  Date/Time:  Wednesday Feb 26 2017 08:15:15 EDT Ventricular Rate:  79 PR Interval:    QRS Duration: 88 QT Interval:  366 QTC Calculation: 420 R Axis:   14 Text Interpretation:  Sinus rhythm Probable left atrial enlargement Probable anteroseptal infarct, old ST segments similar to Nov 2017 Confirmed by Criss Alvine MD, [place] [phone]) on 02/26/2017 8:27:20 AM       Radiology Dg Chest 2 View  Result Date: 02/26/2017 CLINICAL DATA:  Syncopal episode with resultant fall down stairs this morning. Current smoker, alcohol abuse. EXAM: CHEST  2 VIEW COMPARISON:  Portable chest x-ray dated February 20, 2017 FINDINGS: The lungs are mildly hypoinflated which is accentuated by the lordotic positioning. There is no evidence of acute infiltrate or pulmonary contusion. There is no pneumothorax or pleural effusion. The heart and pulmonary vascularity and mediastinal structures are normal. The observed bony structures exhibit no definite acute abnormality. There is widening of the right [facility] joint which has appeared since the previous study. IMPRESSION: There is no acute cardiopulmonary abnormality. New widening of the right [facility] joint may be acute. Correlation with any symptoms over the right [facility] joint is needed. Electronically Signed   By: David  [place] M.D.   On: 02/26/2017 09:33   Ct Head Wo Contrast  Result Date: 02/26/2017 CLINICAL DATA:  Syncope with fall EXAM: CT HEAD WITHOUT CONTRAST CT CERVICAL SPINE WITHOUT CONTRAST TECHNIQUE: Multidetector CT imaging of the head and cervical spine was performed following the standard protocol without intravenous contrast. Multiplanar CT image reconstructions of the cervical spine were also generated. COMPARISON:  Head CT February 20, 2017 FINDINGS: CT HEAD FINDINGS Brain: Mild diffuse atrophy is stable. There is no intracranial mass, hemorrhage, extra-axial fluid collection, or midline shift. There is mild patchy small  vessel disease in the centra semiovale bilaterally. There is no new gray-white compartment lesion. No acute infarct evident. Vascular: No demonstrable hyperdense vessel. There is slight calcification in each cavernous carotid artery. Next Skull: Bony calvarium appears intact. There is, however, an acute appearing nondisplaced fracture of the anterior right zygomatic arch. Sinuses/Orbits: There is mild mucosal thickening  in several ethmoid air cells bilaterally. There is mucosal thickening in the inferior right maxillary antrum. Other paranasal sinuses which are visualized are clear. Orbits appear symmetric bilaterally. Other: Mastoid air cells are clear. CT CERVICAL SPINE FINDINGS Alignment: There is no spondylolisthesis. Skull base and vertebrae: Skullbase and craniocervical junction regions appear normal. There is no fracture in the cervical region. No blastic or lytic bone lesions. Fracture of the right zygomatic arch described in the head CT report is noted. There are also comminuted fractures of the posterior right second rib. Soft tissues and spinal canal: Prevertebral soft tissues and predental space regions are normal. No paraspinous lesions. There is no cord or canal hematoma evident. Disc levels: There is moderately severe disc space narrowing at C4-5 and C6-7. There is moderate disc space narrowing at C5-6. There is calcification in the anterior ligament at C3-4, C4-5, and C5-6. There are anterior osteophytes at C3, C4, C5, and C6. There is facet hypertrophy with exit foraminal narrowing at C5-6 and C6-7 bilaterally. Question degrees of facet hypertrophy noted at several levels. No frank disc extrusion. Upper chest: Visualized lung regions are clear. No apical pneumothorax evident. Other: None IMPRESSION: CT head: Nondisplaced fracture anterior right zygomatic arch. No skull fracture evident. There is stable atrophy with mild periventricular small vessel disease. No intracranial mass, hemorrhage, or  extra-axial fluid collection. There is mild ethmoid air cell disease bilaterally. There is slight carotid artery calcification distally. CT cervical spine: No cervical spine region fracture or spondylolisthesis. There is multilevel arthropathy. There are comminuted fractures of the posterior right second and third ribs as well as a fracture of the right zygomatic arch. There is multifocal cervical spine arthropathy. Electronically Signed   By: Bretta Bang III M.D.   On: 02/26/2017 09:27   Ct Angio Chest Pe W/cm &/or Wo Cm  Result Date: 02/26/2017 CLINICAL DATA:  Mid Chest pain; syncope with fall this morning EXAM: CT ANGIOGRAPHY CHEST WITH CONTRAST TECHNIQUE: Multidetector CT imaging of the chest was performed using the standard protocol during bolus administration of intravenous contrast. Multiplanar CT image reconstructions and MIPs were obtained to evaluate the vascular anatomy. CONTRAST:  100 cc Isovue 370 COMPARISON:  Chest x-ray 02/26/2017 FINDINGS: Cardiovascular: Heart size is normal. No imaged pericardial effusion or significant coronary artery calcifications. Pulmonary arteries are well opacified and there is no acute pulmonary embolus. There is minimal atherosclerotic calcification of the thoracic aorta. Mediastinum/Nodes: Normal appearance of the esophagus. No significant mediastinal, hilar, or axillary adenopathy. The visualized portion of the thyroid gland has a normal appearance. Lungs/Pleura: Small right pleural effusion. There is smooth septal thickening primarily involving the dependent aspects of the lungs. No suspicious pulmonary nodules. No frank consolidations. Upper Abdomen: Gallbladder is present. Musculoskeletal: Mild mid thoracic spondylosis. Review of the MIP images confirms the above findings. IMPRESSION: 1. Technically adequate exam showing no acute pulmonary embolus. 2. Mild changes of pulmonary edema with small right pleural effusion. 3. Mild thoracic atherosclerosis. 4. Mid  thoracic spondylosis. Electronically Signed   By: Norva Pavlov M.D.   On: 02/26/2017 13:03   Ct Cervical Spine Wo Contrast  Result Date: 02/26/2017 CLINICAL DATA:  Syncope with fall EXAM: CT HEAD WITHOUT CONTRAST CT CERVICAL SPINE WITHOUT CONTRAST TECHNIQUE: Multidetector CT imaging of the head and cervical spine was performed following the standard protocol without intravenous contrast. Multiplanar CT image reconstructions of the cervical spine were also generated. COMPARISON:  Head CT February 20, 2017 FINDINGS: CT HEAD FINDINGS Brain: Mild diffuse atrophy is stable.  There is no intracranial mass, hemorrhage, extra-axial fluid collection, or midline shift. There is mild patchy small vessel disease in the centra semiovale bilaterally. There is no new gray-white compartment lesion. No acute infarct evident. Vascular: No demonstrable hyperdense vessel. There is slight calcification in each cavernous carotid artery. Next Skull: Bony calvarium appears intact. There is, however, an acute appearing nondisplaced fracture of the anterior right zygomatic arch. Sinuses/Orbits: There is mild mucosal thickening in several ethmoid air cells bilaterally. There is mucosal thickening in the inferior right maxillary antrum. Other paranasal sinuses which are visualized are clear. Orbits appear symmetric bilaterally. Other: Mastoid air cells are clear. CT CERVICAL SPINE FINDINGS Alignment: There is no spondylolisthesis. Skull base and vertebrae: Skullbase and craniocervical junction regions appear normal. There is no fracture in the cervical region. No blastic or lytic bone lesions. Fracture of the right zygomatic arch described in the head CT report is noted. There are also comminuted fractures of the posterior right second rib. Soft tissues and spinal canal: Prevertebral soft tissues and predental space regions are normal. No paraspinous lesions. There is no cord or canal hematoma evident. Disc levels: There is moderately  severe disc space narrowing at C4-5 and C6-7. There is moderate disc space narrowing at C5-6. There is calcification in the anterior ligament at C3-4, C4-5, and C5-6. There are anterior osteophytes at C3, C4, C5, and C6. There is facet hypertrophy with exit foraminal narrowing at C5-6 and C6-7 bilaterally. Question degrees of facet hypertrophy noted at several levels. No frank disc extrusion. Upper chest: Visualized lung regions are clear. No apical pneumothorax evident. Other: None IMPRESSION: CT head: Nondisplaced fracture anterior right zygomatic arch. No skull fracture evident. There is stable atrophy with mild periventricular small vessel disease. No intracranial mass, hemorrhage, or extra-axial fluid collection. There is mild ethmoid air cell disease bilaterally. There is slight carotid artery calcification distally. CT cervical spine: No cervical spine region fracture or spondylolisthesis. There is multilevel arthropathy. There are comminuted fractures of the posterior right second and third ribs as well as a fracture of the right zygomatic arch. There is multifocal cervical spine arthropathy. Electronically Signed   By: Bretta Bang III M.D.   On: 02/26/2017 09:27    Procedures Procedures (including critical care time)  Medications Ordered in ED Medications  fentaNYL (SUBLIMAZE) injection 50 mcg (not administered)     Initial Impression / Assessment and Plan / ED Course  I have reviewed the triage vital signs and the nursing notes.  Pertinent labs & imaging results that were available during my care of the patient were reviewed by me and considered in my medical decision making (see chart for details).     Patient's workup shows no PE or significant injury. No pain or tenderness/swelling to face, unclear if this zygomatic fracture is new as he says he's had a prior face injury. CT does show pulmonary edema, unclear cause as he otherwise does not appear volume overloaded. In setting of  no prodrome with syncope, will admit for obs. Could be ETOH related. Dr Konrad Dolores to admit  Final Clinical Impressions(s) / ED Diagnoses   Final diagnoses:  Fall, initial encounter  Syncope, unspecified syncope type  Alcohol abuse  Acute pulmonary edema (HCC)  Dizziness  Dizziness  Pulmonary edema    New Prescriptions New Prescriptions   No medications on file     Pricilla Loveless, MD 02/26/17 1657

## 2017-02-26 NOTE — ED Notes (Signed)
Pt ambulates around unit and maintains pulse ox 100%.  Dr. Criss Alvine aware.

## 2017-02-26 NOTE — H&P (Addendum)
History and Physical    Alvin Benton ZOX:[id number] DOB: [birth year] DOA: 02/26/2017  PCP: Marilynn Rail, MD Patient coming from: Home  Chief Complaint: Syncope  HPI: Alvin Benton is a 66 y.o. male with medical history significant of EtOH abuse, GERD, hypertension, migraines, IBS, tobacco abuse. Patient presenting to [facility] emergency room after syncopal episode.    level V caveat applies as patient openly endorses not being entirely sure of the events surrounding his episode or the events of his day leading up to his admission. To the EDP patient endorses drinking 2 beers in the morning prior to leaving for work however to me patient states the only thing he had to drink was coffee. When pressed on this matter patient simply stated he is not sure. Patient denies any recent binge streaking. States he drinks a couple of trauma beverages every afternoon after work. Denies any recent intoxication to the point of passing out, difficulty with swallowing food or drink or other type aspiration episodes. Patient states he was walking down a flight of stairs on his way to work when he passed out. Denies any presyncopal symptoms such as chest pain, palpitations, nausea, dizziness, lightheadedness, vertigo, headache, weakness. Patient states he simply woke up on the ground at the bottom of the stairs to somebody trying to help him up. Patient denies any post syncopal confusion, unilateral weakness or facial asymmetry, loss of bowel or bladder function. Patient states that since being discharged from the hospital on April 29 he has been in his normal state of health and denies any recent fevers, dysuria, frequent, no pain, nausea, vomiting or chest pain, palpitations, neck stiffness.   Currently patient states his only complaint is right mid back pain at the location of his shoulder blade and constant dizziness. Patient states that the dizziness is associated with a feeling of being unsteady and unstable with  intermittent room spinning.   ED Course: Objective findings outlined below. IV narcotics provided for pain due to rib and zygomatic fractures  Review of Systems: As per HPI otherwise all other systems reviewed and are negative  Ambulatory Status: No restrictions.  Past Medical History:  Diagnosis Date  . Blood in stool   . Diverticulitis   . ETOH abuse   . GERD (gastroesophageal reflux disease)   . Heart murmur   . History of urinary tract infection   . Hypertension   . IBS (irritable bowel syndrome)   . Migraine    "none in a long time" (03/14/2016)  . Nocturia   . Pneumonia    childhood  . Seasonal allergies   . Tobacco abuse     Past Surgical History:  Procedure Laterality Date  . COLONOSCOPY    . CYSTOSCOPY WITH STENT PLACEMENT Left 10/03/2016   Procedure: CYSTOSCOPY WITH  LEFT URETERAL CATHETER PLACEMENT;  Surgeon: Bjorn Pippin, MD;  Location: [facility];  Service: Urology;  Laterality: Left;  . PROCTOSCOPY  10/03/2016   Procedure: PROCTOSCOPY;  Surgeon: Romie Levee, MD;  Location: [facility];  Service: General;;  . TONSILLECTOMY      Social History   Social History  . Marital status: Single    Spouse name: N/A  . Number of children: 1  . Years of education: N/A   Occupational History  . Inventory  [employer]   Social History Main Topics  . Smoking status: Current Every Day Smoker    Packs/day: 1.00    Years: 40.00    Types: Cigarettes  . Smokeless tobacco: Never Used  .  Alcohol use 1.8 oz/week    3 Shots of liquor per week     Comment: pt states does not drink daily has beer with football  . Drug use: No  . Sexual activity: Not Currently   Other Topics Concern  . Not on file   Social History Narrative   Area and one daughter   Regulatory affairs officer at [employer]   One caffeinated beverage a day    No Known Allergies  Family History  Problem Relation Age of Onset  . Stroke Mother   . Colon cancer Neg Hx   . Esophageal cancer Neg Hx     . Rectal cancer Neg Hx   . Stomach cancer Neg Hx     Prior to Admission medications   Medication Sig Start Date End Date Taking? Authorizing Provider  aspirin 325 MG tablet Take 325 mg by mouth daily.   Yes Historical Provider, MD  valsartan-hydrochlorothiazide (DIOVAN-HCT) 160-25 MG tablet Take 1 tablet by mouth daily.   Yes Historical Provider, MD  albuterol (PROVENTIL HFA;VENTOLIN HFA) 108 (90 BASE) MCG/ACT inhaler Inhale 2 puffs into the lungs every 6 (six) hours as needed for wheezing or shortness of breath. Patient taking differently: Inhale 2 puffs into the lungs every 4 (four) hours as needed for wheezing or shortness of breath.  07/17/15   Sheliah Hatch, MD  chlordiazePOXIDE (LIBRIUM) 25 MG capsule Take 1 capsule (25 mg total) by mouth 3 (three) times daily as needed for anxiety. Patient not taking: Reported on 02/26/2017 02/23/17   Penny Pia, MD  Multiple Vitamin (MULTIVITAMIN WITH MINERALS) TABS tablet Take 1 tablet by mouth daily. Patient not taking: Reported on 02/26/2017 05/09/16   Rodolph Bong, MD  oxyCODONE (OXY IR/ROXICODONE) 5 MG immediate release tablet Take 1-2 tablets (5-10 mg total) by mouth every 4 (four) hours as needed for moderate pain, severe pain or breakthrough pain. Patient not taking: Reported on 02/20/2017 10/07/16   Romie Levee, MD  potassium chloride SA (K-DUR,KLOR-CON) 20 MEQ tablet Take 2 tablets (40 mEq total) by mouth 2 (two) times daily. Patient not taking: Reported on 02/20/2017 08/29/16   Benjiman Core, MD    Physical Exam: Vitals:   02/26/17 1515 02/26/17 1530 02/26/17 1545 02/26/17 1600  BP: (!) 114/59 (!) 157/90 (!) 146/73 (!) 143/75  Pulse: 78 74 77 74  Resp: 16 (!) 31 (!) 24 (!) 24  Temp:      TempSrc:      SpO2: 96% 99% 99% 100%  Weight:      Height:         General:  Appears calm and comfortable Eyes:  PERRL, EOMI, normal lids, iris ENT:  grossly normal hearing, lips & tongue, mmm Neck:  no LAD, masses or  thyromegaly Cardiovascular:  RRR, no m/r/g. No LE edema.  Respiratory:  CTA bilaterally, no w/r/r. Normal respiratory effort. Abdomen:  soft, ntnd, NABS, large right flank hematoma that is tender to palpation, abdomen otherwise normal. Skin: Multiple areas of abrasion and bruising noted. No rash. No jaundice. Musculoskeletal: Point tenderness along the right proximal second and third ribs, left zygomatic process and right abdominal area. No appreciable bony abnormalities. Normal range of motion of upper and lower extremities bilaterally. grossly normal tone BUE/BLE, good ROM, no bony abnormality Psychiatric: grossly normal mood and affect, speech fluent and appropriate, AOx3 Neurologic: Cranial nerves II through XII intact, sensation intact, fine motor difficulty appreciated in the right upper extremity with finger to nose. Patient unable to sit  to evaluate truncal ataxia.  Labs on Admission: I have personally reviewed following labs and imaging studies  CBC:  Recent Labs Lab 02/20/17 1600 02/20/17 2117 02/21/17 0557 02/26/17 0835  WBC 14.9* 10.9* 9.6 10.1  NEUTROABS  --   --   --  7.9*  HGB 17.3* 13.9 13.2 12.0*  HCT 44.6 36.8* 35.9* 34.6*  MCV 85.0 85.8 86.9 93.5  PLT 549* 393 361 380   Basic Metabolic Panel:  Recent Labs Lab 02/20/17 2117  02/22/17 0221 02/22/17 0802 02/22/17 1417 02/22/17 2006 02/26/17 0835  NA 118*  < > 124* 125* 125* 124* 137  K 2.7*  < > 3.0* 3.1* 3.2* 3.3* 3.7  CL 78*  < > 91* 89* 91* 89* 103  CO2 27  < > 25 25 26 27 26   GLUCOSE 91  < > 105* 99 132* 106* 101*  BUN 14  < > 11 10 10 9 6   CREATININE 1.25*  < > 0.73 0.71 0.84 0.83 0.81  CALCIUM 8.1*  < > 7.8* 8.2* 8.2* 8.4* 9.1  MG 1.5*  --   --   --   --   --   --   PHOS 1.9*  --   --   --   --   --   --   < > = values in this interval not displayed. GFR: Estimated Creatinine Clearance: 92.1 mL/min (by C-G formula based on SCr of 0.81 mg/dL). Liver Function Tests:  Recent Labs Lab  02/20/17 1600 02/26/17 0835  AST 76* 52*  ALT 36 44  ALKPHOS 57 41  BILITOT 1.5* 0.2*  PROT 7.0 5.5*  ALBUMIN 3.9 3.1*    Recent Labs Lab 02/20/17 1848  LIPASE 29   No results for input(s): AMMONIA in the last 168 hours. Coagulation Profile: No results for input(s): INR, PROTIME in the last 168 hours. Cardiac Enzymes:  Recent Labs Lab 02/26/17 0835  TROPONINI <0.03   BNP (last 3 results) No results for input(s): PROBNP in the last 8760 hours. HbA1C: No results for input(s): HGBA1C in the last 72 hours. CBG:  Recent Labs Lab 02/20/17 1641  GLUCAP 105*   Lipid Profile: No results for input(s): CHOL, HDL, LDLCALC, TRIG, CHOLHDL, LDLDIRECT in the last 72 hours. Thyroid Function Tests: No results for input(s): TSH, T4TOTAL, FREET4, T3FREE, THYROIDAB in the last 72 hours. Anemia Panel: No results for input(s): VITAMINB12, FOLATE, FERRITIN, TIBC, IRON, RETICCTPCT in the last 72 hours. Urine analysis:    Component Value Date/Time   COLORURINE YELLOW 02/20/2017 1903   APPEARANCEUR CLEAR 02/20/2017 1903   LABSPEC 1.009 02/20/2017 1903   PHURINE 5.0 02/20/2017 1903   GLUCOSEU NEGATIVE 02/20/2017 1903   HGBUR NEGATIVE 02/20/2017 1903   BILIRUBINUR NEGATIVE 02/20/2017 1903   KETONESUR NEGATIVE 02/20/2017 1903   PROTEINUR NEGATIVE 02/20/2017 1903   NITRITE NEGATIVE 02/20/2017 1903   LEUKOCYTESUR NEGATIVE 02/20/2017 1903    Creatinine Clearance: Estimated Creatinine Clearance: 92.1 mL/min (by C-G formula based on SCr of 0.81 mg/dL).  Sepsis Labs: @LABRCNTIP (procalcitonin:4,lacticidven:4) ) Recent Results (from the past 240 hour(s))  Blood Culture (routine x 2)     Status: None   Collection Time: 02/20/17  4:30 PM  Result Value Ref Range Status   Specimen Description BLOOD RIGHT FOREARM  Final   Special Requests   Final    BOTTLES DRAWN AEROBIC AND ANAEROBIC Blood Culture adequate volume   Culture NO GROWTH 5 DAYS  Final   Report Status 02/25/2017 FINAL  Final  Blood Culture (routine x 2)     Status: None   Collection Time: 02/20/17  6:30 PM  Result Value Ref Range Status   Specimen Description BLOOD LEFT FOREARM  Final   Special Requests   Final    BOTTLES DRAWN AEROBIC AND ANAEROBIC Blood Culture adequate volume   Culture NO GROWTH 5 DAYS  Final   Report Status 02/25/2017 FINAL  Final     Radiological Exams on Admission: Dg Chest 2 View  Result Date: 02/26/2017 CLINICAL DATA:  Syncopal episode with resultant fall down stairs this morning. Current smoker, alcohol abuse. EXAM: CHEST  2 VIEW COMPARISON:  Portable chest x-ray dated February 20, 2017 FINDINGS: The lungs are mildly hypoinflated which is accentuated by the lordotic positioning. There is no evidence of acute infiltrate or pulmonary contusion. There is no pneumothorax or pleural effusion. The heart and pulmonary vascularity and mediastinal structures are normal. The observed bony structures exhibit no definite acute abnormality. There is widening of the right [facility] joint which has appeared since the previous study. IMPRESSION: There is no acute cardiopulmonary abnormality. New widening of the right [facility] joint may be acute. Correlation with any symptoms over the right [facility] joint is needed. Electronically Signed   By: Katheryn Culliton  [place] M.D.   On: 02/26/2017 09:33   Ct Head Wo Contrast  Result Date: 02/26/2017 CLINICAL DATA:  Syncope with fall EXAM: CT HEAD WITHOUT CONTRAST CT CERVICAL SPINE WITHOUT CONTRAST TECHNIQUE: Multidetector CT imaging of the head and cervical spine was performed following the standard protocol without intravenous contrast. Multiplanar CT image reconstructions of the cervical spine were also generated. COMPARISON:  Head CT February 20, 2017 FINDINGS: CT HEAD FINDINGS Brain: Mild diffuse atrophy is stable. There is no intracranial mass, hemorrhage, extra-axial fluid collection, or midline shift. There is mild patchy small vessel disease in the centra semiovale bilaterally. There is no new  gray-white compartment lesion. No acute infarct evident. Vascular: No demonstrable hyperdense vessel. There is slight calcification in each cavernous carotid artery. Next Skull: Bony calvarium appears intact. There is, however, an acute appearing nondisplaced fracture of the anterior right zygomatic arch. Sinuses/Orbits: There is mild mucosal thickening in several ethmoid air cells bilaterally. There is mucosal thickening in the inferior right maxillary antrum. Other paranasal sinuses which are visualized are clear. Orbits appear symmetric bilaterally. Other: Mastoid air cells are clear. CT CERVICAL SPINE FINDINGS Alignment: There is no spondylolisthesis. Skull base and vertebrae: Skullbase and craniocervical junction regions appear normal. There is no fracture in the cervical region. No blastic or lytic bone lesions. Fracture of the right zygomatic arch described in the head CT report is noted. There are also comminuted fractures of the posterior right second rib. Soft tissues and spinal canal: Prevertebral soft tissues and predental space regions are normal. No paraspinous lesions. There is no cord or canal hematoma evident. Disc levels: There is moderately severe disc space narrowing at C4-5 and C6-7. There is moderate disc space narrowing at C5-6. There is calcification in the anterior ligament at C3-4, C4-5, and C5-6. There are anterior osteophytes at C3, C4, C5, and C6. There is facet hypertrophy with exit foraminal narrowing at C5-6 and C6-7 bilaterally. Question degrees of facet hypertrophy noted at several levels. No frank disc extrusion. Upper chest: Visualized lung regions are clear. No apical pneumothorax evident. Other: None IMPRESSION: CT head: Nondisplaced fracture anterior right zygomatic arch. No skull fracture evident. There is stable atrophy with mild periventricular small vessel disease. No intracranial mass, hemorrhage, or extra-axial  fluid collection. There is mild ethmoid air cell disease  bilaterally. There is slight carotid artery calcification distally. CT cervical spine: No cervical spine region fracture or spondylolisthesis. There is multilevel arthropathy. There are comminuted fractures of the posterior right second and third ribs as well as a fracture of the right zygomatic arch. There is multifocal cervical spine arthropathy. Electronically Signed   By: Bretta Bang III M.D.   On: 02/26/2017 09:27   Ct Angio Chest Pe W/cm &/or Wo Cm  Result Date: 02/26/2017 CLINICAL DATA:  Mid Chest pain; syncope with fall this morning EXAM: CT ANGIOGRAPHY CHEST WITH CONTRAST TECHNIQUE: Multidetector CT imaging of the chest was performed using the standard protocol during bolus administration of intravenous contrast. Multiplanar CT image reconstructions and MIPs were obtained to evaluate the vascular anatomy. CONTRAST:  100 cc Isovue 370 COMPARISON:  Chest x-ray 02/26/2017 FINDINGS: Cardiovascular: Heart size is normal. No imaged pericardial effusion or significant coronary artery calcifications. Pulmonary arteries are well opacified and there is no acute pulmonary embolus. There is minimal atherosclerotic calcification of the thoracic aorta. Mediastinum/Nodes: Normal appearance of the esophagus. No significant mediastinal, hilar, or axillary adenopathy. The visualized portion of the thyroid gland has a normal appearance. Lungs/Pleura: Small right pleural effusion. There is smooth septal thickening primarily involving the dependent aspects of the lungs. No suspicious pulmonary nodules. No frank consolidations. Upper Abdomen: Gallbladder is present. Musculoskeletal: Mild mid thoracic spondylosis. Review of the MIP images confirms the above findings. IMPRESSION: 1. Technically adequate exam showing no acute pulmonary embolus. 2. Mild changes of pulmonary edema with small right pleural effusion. 3. Mild thoracic atherosclerosis. 4. Mid thoracic spondylosis. Electronically Signed   By: Norva Pavlov  M.D.   On: 02/26/2017 13:03   Ct Cervical Spine Wo Contrast  Result Date: 02/26/2017 CLINICAL DATA:  Syncope with fall EXAM: CT HEAD WITHOUT CONTRAST CT CERVICAL SPINE WITHOUT CONTRAST TECHNIQUE: Multidetector CT imaging of the head and cervical spine was performed following the standard protocol without intravenous contrast. Multiplanar CT image reconstructions of the cervical spine were also generated. COMPARISON:  Head CT February 20, 2017 FINDINGS: CT HEAD FINDINGS Brain: Mild diffuse atrophy is stable. There is no intracranial mass, hemorrhage, extra-axial fluid collection, or midline shift. There is mild patchy small vessel disease in the centra semiovale bilaterally. There is no new gray-white compartment lesion. No acute infarct evident. Vascular: No demonstrable hyperdense vessel. There is slight calcification in each cavernous carotid artery. Next Skull: Bony calvarium appears intact. There is, however, an acute appearing nondisplaced fracture of the anterior right zygomatic arch. Sinuses/Orbits: There is mild mucosal thickening in several ethmoid air cells bilaterally. There is mucosal thickening in the inferior right maxillary antrum. Other paranasal sinuses which are visualized are clear. Orbits appear symmetric bilaterally. Other: Mastoid air cells are clear. CT CERVICAL SPINE FINDINGS Alignment: There is no spondylolisthesis. Skull base and vertebrae: Skullbase and craniocervical junction regions appear normal. There is no fracture in the cervical region. No blastic or lytic bone lesions. Fracture of the right zygomatic arch described in the head CT report is noted. There are also comminuted fractures of the posterior right second rib. Soft tissues and spinal canal: Prevertebral soft tissues and predental space regions are normal. No paraspinous lesions. There is no cord or canal hematoma evident. Disc levels: There is moderately severe disc space narrowing at C4-5 and C6-7. There is moderate disc  space narrowing at C5-6. There is calcification in the anterior ligament at C3-4, C4-5, and C5-6. There are  anterior osteophytes at C3, C4, C5, and C6. There is facet hypertrophy with exit foraminal narrowing at C5-6 and C6-7 bilaterally. Question degrees of facet hypertrophy noted at several levels. No frank disc extrusion. Upper chest: Visualized lung regions are clear. No apical pneumothorax evident. Other: None IMPRESSION: CT head: Nondisplaced fracture anterior right zygomatic arch. No skull fracture evident. There is stable atrophy with mild periventricular small vessel disease. No intracranial mass, hemorrhage, or extra-axial fluid collection. There is mild ethmoid air cell disease bilaterally. There is slight carotid artery calcification distally. CT cervical spine: No cervical spine region fracture or spondylolisthesis. There is multilevel arthropathy. There are comminuted fractures of the posterior right second and third ribs as well as a fracture of the right zygomatic arch. There is multifocal cervical spine arthropathy. Electronically Signed   By: Bretta Bang III M.D.   On: 02/26/2017 09:27    EKG: Independently reviewed.   Assessment/Plan Active Problems:   Tobacco use disorder   Alcohol abuse   Essential hypertension   Syncope   History of atrial fibrillation   Right flank hematoma   Zygomatic fracture, right side, initial encounter for closed fracture [facility])   Multiple rib fractures   Syncope: Multiple etiologies possible including and most likely intoxication, arrhythmia, stroke, orthostatic hypotension, seizure. Blood alcohol level 329. Crushable ingestion of 2 beer in the morning prior to episode. No reported chest pain or palpitations. H/o Afib noted in chart though pt denies this. Patient continues to have significant dizzy symptoms which may be due to intoxication versus stroke. - MRI brain STAT - syncope orderset - orthostatic VS - Tele - Echo - PT in am  ETOH  abuse: ongoing. 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 Suspect from aspiration while intoxicated. Cannot r/o infectious process but w/o further sx will wait on ABX. Pt at high risk for pneumonia due to rib fractures. Doubt from CHF as BNP 157.  - Procalcitonin - Incentive spirometer - pulmonary PT - CXR in am  Afib: noted on single EKG on 04/04/16. Per review of records from DC summary during admission patient was given a single dose of diltiazem with conversion to sinus rhythm. Patient never followed up with cardiology after this admission but is been compliant on his aspirin which was started prophylactically. Patient is not on any rate controlling medicines. Currently in sinus. - Telemetry - Continue aspirin in a.m. - Discussed importance the patient follows up with cardiology at time of discharge.  Flank Hematoma: developing since presentation to ED. Very large and firm and ttp. No intraabominal pain.  - CT Abd stat - H&H Q8  Fractures: non-displaced R zygomatic fracture and R 2nd and 3rd proximal comminuted fractures noted. Correlates w/ pts complaints of back/chest pain. Non-operative. No pneumothorax.  - Pain control requiring multiple po and IV treatment options   HTN: - orthostatic VS - Hold valsartan - Hold HCTZ due to recent admission for hyponatremia in setting of continued ETOH abuse. Consider DC this RX at time of discharge.  - Hydralazine PRN   DVT prophylaxis: SCD  Code Status: full  Family Communication: none  Disposition Plan: pending improvement likely to require several day admission due to profound pain from fractures and workup for syncope and ETOH abuse.   Consults called: none  Admission status: inpt    Lachele Lievanos J MD [facility]  If 7PM-7AM,  please contact night-coverage [email or web address] Password  [facility]  02/26/2017, 4:28 PM

## 2017-02-26 NOTE — Progress Notes (Signed)
Pt symptomatic, unable to obtain orthostatics at this time. Will reassess and monitor.

## 2017-02-26 NOTE — ED Notes (Signed)
Pt saying OW loudly and repeatedly. Informed pt that I was tryoing to get order from dr. Evelena Benton for pain meds prior to transport.

## 2017-02-26 NOTE — Patient Outreach (Signed)
[facility]) Care Management  02/26/2017  Alvin Benton 1951/03/17 [id number]   Per [name] at [facility] Care Management, per [facility] [name], patient is currently being followed by internal [employer] CM, and no need for [name] Care Management to outreach to patient.  [name] RNCM will proceed with case closure, send closure due to other CM program request to [name] at [facility] Care Management.      [name] H. Gardiner Barefoot, BSN, CCM [facility] Care Management [facility] Telephonic CM Phone: [phone] Fax: [phone]

## 2017-02-27 ENCOUNTER — Inpatient Hospital Stay (HOSPITAL_COMMUNITY): Payer: Managed Care, Other (non HMO)

## 2017-02-27 DIAGNOSIS — I340 Nonrheumatic mitral (valve) insufficiency: Secondary | ICD-10-CM

## 2017-02-27 LAB — CBC
HCT: 31.1 % — ABNORMAL LOW (ref 39.0–52.0)
HEMOGLOBIN: 10.4 g/dL — AB (ref 13.0–17.0)
MCH: 31.6 pg (ref 26.0–34.0)
MCHC: 33.4 g/dL (ref 30.0–36.0)
MCV: 94.5 fL (ref 78.0–100.0)
PLATELETS: 380 10*3/uL (ref 150–400)
RBC: 3.29 MIL/uL — ABNORMAL LOW (ref 4.22–5.81)
RDW: 13.4 % (ref 11.5–15.5)
WBC: 9.2 10*3/uL (ref 4.0–10.5)

## 2017-02-27 LAB — COMPREHENSIVE METABOLIC PANEL
ALBUMIN: 2.8 g/dL — AB (ref 3.5–5.0)
ALK PHOS: 44 U/L (ref 38–126)
ALT: 32 U/L (ref 17–63)
ANION GAP: 10 (ref 5–15)
AST: 37 U/L (ref 15–41)
BUN: 11 mg/dL (ref 6–20)
CALCIUM: 8.5 mg/dL — AB (ref 8.9–10.3)
CHLORIDE: 97 mmol/L — AB (ref 101–111)
CO2: 26 mmol/L (ref 22–32)
Creatinine, Ser: 0.8 mg/dL (ref 0.61–1.24)
GFR calc Af Amer: >60 mL/min (ref >60)
GFR calc non Af Amer: >60 mL/min (ref >60)
GLUCOSE: 110 mg/dL — AB (ref 65–99)
POTASSIUM: 4 mmol/L (ref 3.5–5.1)
SODIUM: 133 mmol/L — AB (ref 135–145)
Total Bilirubin: 0.9 mg/dL (ref 0.3–1.2)
Total Protein: 5.1 g/dL — ABNORMAL LOW (ref 6.5–8.1)

## 2017-02-27 LAB — ECHOCARDIOGRAM COMPLETE
AOASC: 32 cm
E/e' ratio: 9.08
EWDT: 225 ms
FS: 31 % (ref 28–44)
HEIGHTINCHES: 69 in
IV/PV OW: 1.04
LA ID, A-P, ES: 39 mm
LA diam end sys: 39 mm
LA diam index: 2.04 cm/m2
LA vol A4C: 50.6 ml
LA vol index: 26.6 mL/m2
LA vol: 50.8 mL
LDCA: 2.27 cm2
LV E/e' medial: 9.08
LV PW d: 9.54 mm — AB (ref 0.6–1.1)
LV TDI E'LATERAL: 10.6
LV e' LATERAL: 10.6 cm/s
LVEEAVG: 9.08
LVOTD: 17 mm
MV Dec: 225
MV pk E vel: 96.2 m/s
MVPG: 4 mmHg
MVPKAVEL: 134 m/s
P 1/2 time: 468 ms
RV LATERAL S' VELOCITY: 13.7 cm/s
RV TAPSE: 24.8 mm
TDI e' medial: 6.25
WEIGHTICAEL: 2656 [oz_av]

## 2017-02-27 LAB — HEMOGLOBIN AND HEMATOCRIT, BLOOD
HEMATOCRIT: 32.1 % — AB (ref 39.0–52.0)
HEMOGLOBIN: 10.9 g/dL — AB (ref 13.0–17.0)

## 2017-02-27 LAB — CK: Total CK: 292 U/L (ref 49–397)

## 2017-02-27 MED ORDER — CHLORDIAZEPOXIDE HCL 5 MG PO CAPS
10.0000 mg | ORAL_CAPSULE | Freq: Three times a day (TID) | ORAL | Status: DC
  Administered 2017-02-27 – 2017-03-01 (×9): 10 mg via ORAL
  Filled 2017-02-27 (×9): qty 2

## 2017-02-27 MED ORDER — CARVEDILOL 6.25 MG PO TABS
6.2500 mg | ORAL_TABLET | Freq: Two times a day (BID) | ORAL | Status: DC
  Administered 2017-02-27 – 2017-03-01 (×4): 6.25 mg via ORAL
  Filled 2017-02-27 (×4): qty 1

## 2017-02-27 MED ORDER — HYDRALAZINE HCL 50 MG PO TABS
50.0000 mg | ORAL_TABLET | Freq: Three times a day (TID) | ORAL | Status: DC
  Administered 2017-02-27 – 2017-03-03 (×11): 50 mg via ORAL
  Filled 2017-02-27 (×11): qty 1

## 2017-02-27 MED ORDER — DEXTROSE-NACL 5-0.45 % IV SOLN
INTRAVENOUS | Status: DC
  Administered 2017-02-27: 16:00:00 via INTRAVENOUS

## 2017-02-27 MED ORDER — ASPIRIN 81 MG PO CHEW
81.0000 mg | CHEWABLE_TABLET | Freq: Every day | ORAL | Status: DC
  Administered 2017-02-27 – 2017-03-03 (×5): 81 mg via ORAL
  Filled 2017-02-27 (×5): qty 1

## 2017-02-27 NOTE — Progress Notes (Signed)
  Echocardiogram 2D Echocardiogram has been performed.  Delcie RochENNINGTON, Coulter Oldaker 02/27/2017, 12:31 PM

## 2017-02-27 NOTE — Progress Notes (Signed)
PROGRESS NOTE                                                                                                                                                                                                             Patient Demographics:    Alvin Benton, is a 66 y.o. male, DOB - [birth year], ZOX:[id number]  Admit date - 02/26/2017   Admitting Physician Ozella Rocks, MD  Outpatient Primary MD for the patient is Marilynn Rail, MD  LOS - 1  Chief Complaint  Patient presents with  . Fall  . Chest Pain       Brief Narrative  Alvin Benton is a 66 y.o. male with medical history significant of EtOH abuse, GERD, hypertension, migraines, IBS, tobacco abuse. Patient presenting to [facility] emergency room after syncopal episode, apparently he was leaving for work and fell through a flight of stairs, he does not recall passing out, he did say that he had some dizziness prior to the fall with sensation of some spinning, note his alcohol level was extremely high upon admission and he now except that he drank before the fall. He says he drinks on a daily basis and he drinks a lot.   Subjective:    Alvin Benton today has, No headache, No chest pain, No abdominal pain - No Nausea, No new weakness tingling or numbness, No Cough - SOB. +ve R shoulder pain.   Assessment  & Plan :     1.Syncope and fall. Most likely mechanical fall due to alcohol intoxication. CT head, CT C-spine, MRI brain all nonacute. He has no focal deficits, no headache at this time. Monitor orthostatics complete echocardiogram him a PT eval and follow.  2. Alcohol abuse. At risk for DTs, placed on scheduled Librium along with CIWA protocol, counseled to quit alcohol.  3. Right shoulder pain. Right flank hematoma. Due to the fall at home, check right shoulder CT to rule out a fracture. Supportive care for now.  4. Hypertension. Placed on Coreg and hydralazine  along with as needed IV hydralazine.  5. Paroxysmal atrial fibrillation. [place] vasc 2 score of at least 2. History of alcohol binging and multiple falls, poor candidate for anticoagulation, place on aspirin 81 mg along with Coreg and monitor.  6. Fractures: non-displaced R zygomatic fracture and R 2nd and 3rd proximal comminuted fractures  noted. Correlates w/ pts complaints of back/chest pain  7. Atelectasis with small left pleural effusion. Add an incentive spirometry. Clinically no CHF.    Diet : Diet regular Room service appropriate? Yes; Fluid consistency: Thin    Family Communication  :  None  Code Status :  Full  Disposition Plan  :  TBD  Consults  :    Procedures  :    CT head, CT C-spine, MRI brain. All nonacute.  MRI brain  CT right shoulder  DVT Prophylaxis  :   SCDs    Lab Results  Component Value Date   PLT 380 02/27/2017    Inpatient Medications  Scheduled Meds: . aspirin  325 mg Oral Daily  . chlordiazePOXIDE  10 mg Oral TID  . folic acid  1 mg Oral Daily  . multivitamin with minerals  1 tablet Oral Daily  . thiamine  100 mg Oral Daily   Or  . thiamine  100 mg Intravenous Daily   Continuous Infusions: . dextrose 5 % and 0.45% NaCl     PRN Meds:.acetaminophen, albuterol, fentaNYL (SUBLIMAZE) injection, hydrALAZINE, ketorolac, LORazepam **OR** LORazepam, nicotine, ondansetron **OR** ondansetron (ZOFRAN) IV, oxyCODONE  Antibiotics  :    Anti-infectives    None         Objective:   Vitals:   02/26/17 2110 02/26/17 2323 02/27/17 0532 02/27/17 0742  BP: 106/65 110/68 (!) 143/68 (!) 162/69  Pulse: 83 (!) 105 85 91  Resp: 20 18 18 18   Temp: 98.4 F (36.9 C) 97.5 F (36.4 C) 99 F (37.2 C) 98.5 F (36.9 C)  TempSrc: Oral Oral Oral Oral  SpO2: 100% 100% 99% 97%  Weight:   75.3 kg (166 lb)   Height:        Wt Readings from Last 3 Encounters:  02/27/17 75.3 kg (166 lb)  02/20/17 76.8 kg (169 lb 5 oz)  10/03/16 71.2 kg (157 lb)      Intake/Output Summary (Last 24 hours) at 02/27/17 1114 Last data filed at 02/27/17 0932  Gross per 24 hour  Intake           253.33 ml  Output              950 ml  Net          -696.67 ml     Physical Exam  Awake Alert, Oriented X 3, No new F.N deficits, Normal affect, Resting tremors [name].AT,PERRAL Supple Neck,No JVD, No cervical lymphadenopathy appriciated.  Symmetrical Chest wall movement, Good air movement bilaterally, CTAB RRR,No Gallops,Rubs or new Murmurs, No Parasternal Heave +ve B.Sounds, Abd Soft, No tenderness, No organomegaly appriciated, No rebound - guarding or rigidity. No Cyanosis, Clubbing or edema, No new Rash , Right flank bruise, painful range of motion in the right shoulder    Data Review:    CBC  Recent Labs Lab 02/20/17 1600 02/20/17 2117 02/21/17 0557 02/26/17 0835 02/26/17 1648 02/27/17 0038 02/27/17 0355  WBC 14.9* 10.9* 9.6 10.1  --   --  9.2  HGB 17.3* 13.9 13.2 12.0* 12.5* 10.9* 10.4*  HCT 44.6 36.8* 35.9* 34.6* 36.5* 32.1* 31.1*  PLT 549* 393 361 380  --   --  380  MCV 85.0 85.8 86.9 93.5  --   --  94.5  MCH 33.0 32.4 32.0 32.4  --   --  31.6  MCHC 38.8* 37.8* 36.8* 34.7  --   --  33.4  RDW 12.3 12.1 12.1 13.1  --   --  13.4  LYMPHSABS  --   --   --  1.3  --   --   --   MONOABS  --   --   --  0.8  --   --   --   EOSABS  --   --   --  0.1  --   --   --   BASOSABS  --   --   --  0.1  --   --   --     Chemistries   Recent Labs Lab 02/20/17 1600 02/20/17 2117  02/22/17 0802 02/22/17 1417 02/22/17 2006 02/26/17 0835 02/27/17 0355  NA 113* 118*  < > 125* 125* 124* 137 133*  K 2.9* 2.7*  < > 3.1* 3.2* 3.3* 3.7 4.0  CL 67* 78*  < > 89* 91* 89* 103 97*  CO2 25 27  < > 25 26 27 26 26   GLUCOSE 103* 91  < > 99 132* 106* 101* 110*  BUN 16 14  < > 10 10 9 6 11   CREATININE 1.49* 1.25*  < > 0.71 0.84 0.83 0.81 0.80  CALCIUM 9.5 8.1*  < > 8.2* 8.2* 8.4* 9.1 8.5*  MG  --  1.5*  --   --   --   --   --   --   AST 76*  --   --   --    --   --  52* 37  ALT 36  --   --   --   --   --  44 32  ALKPHOS 57  --   --   --   --   --  41 44  BILITOT 1.5*  --   --   --   --   --  0.2* 0.9  < > = values in this interval not displayed. ------------------------------------------------------------------------------------------------------------------ No results for input(s): CHOL, HDL, LDLCALC, TRIG, CHOLHDL, LDLDIRECT in the last 72 hours.  Lab Results  Component Value Date   HGBA1C 4.4 (L) 08/27/2016   ------------------------------------------------------------------------------------------------------------------ No results for input(s): TSH, T4TOTAL, T3FREE, THYROIDAB in the last 72 hours.  Invalid input(s): FREET3 ------------------------------------------------------------------------------------------------------------------ No results for input(s): VITAMINB12, FOLATE, FERRITIN, TIBC, IRON, RETICCTPCT in the last 72 hours.  Coagulation profile No results for input(s): INR, PROTIME in the last 168 hours.   Recent Labs  02/26/17 0835  DDIMER 11.40*    Cardiac Enzymes  Recent Labs Lab 02/26/17 0835  TROPONINI <0.03   ------------------------------------------------------------------------------------------------------------------    Component Value Date/Time   BNP 156.2 (H) 02/26/2017 1356    Micro Results Recent Results (from the past 240 hour(s))  Blood Culture (routine x 2)     Status: None   Collection Time: 02/20/17  4:30 PM  Result Value Ref Range Status   Specimen Description BLOOD RIGHT FOREARM  Final   Special Requests   Final    BOTTLES DRAWN AEROBIC AND ANAEROBIC Blood Culture adequate volume   Culture NO GROWTH 5 DAYS  Final   Report Status 02/25/2017 FINAL  Final  Blood Culture (routine x 2)     Status: None   Collection Time: 02/20/17  6:30 PM  Result Value Ref Range Status   Specimen Description BLOOD LEFT FOREARM  Final   Special Requests   Final    BOTTLES DRAWN AEROBIC AND ANAEROBIC  Blood Culture adequate volume   Culture NO GROWTH 5 DAYS  Final   Report Status 02/25/2017 FINAL  Final    Radiology Reports Ct  Abdomen Pelvis Wo Contrast  Result Date: 02/26/2017 CLINICAL DATA:  Pt states he fell yesterday and now has pain on right side of abdomen. Pt endorses n/v, denies diarrhea. HTN, IBS, diverticulitis EXAM: CT ABDOMEN AND PELVIS WITHOUT CONTRAST TECHNIQUE: Multidetector CT imaging of the abdomen and pelvis was performed following the standard protocol without IV contrast. COMPARISON:  CT of abdomen and pelvis 02/20/2017 FINDINGS: Lower chest: Interval development of small right pleural effusion. There is bibasilar atelectasis. Heart size is normal. Hepatobiliary: No focal liver abnormality is seen. No gallstones, gallbladder wall thickening, or biliary dilatation. Pancreas: Unremarkable. No pancreatic ductal dilatation or surrounding inflammatory changes. Spleen: Normal in size without focal abnormality. Adrenals/Urinary Tract: Adrenals are normal in appearance. Normal appearance of both kidneys. Contrast is identified in the collecting systems following prior contrast exam. Urinary bladder is normal in appearance, opacified with contrast following recent contrast exam. Stomach/Bowel: The stomach and small bowel loops are normal in appearance. The appendix is well seen and has a normal appearance. Colon contains moderate stool burden. No evidence for obstruction. Suture line identified in the rectosigmoid colon. Vascular/Lymphatic: There is atherosclerotic calcification of the abdominal aorta. No retroperitoneal or mesenteric adenopathy. Reproductive: Prostate is enlarged. Other: Within the region of the right flank, there is significant edema and subcutaneous stranding, new since the most recent CT exam. The hematoma measures 5.9 x 8.1 cm. There is no underlying fracture. Musculoskeletal: Mild degenerative changes are seen in thoracic and lumbar spine. No acute fracture or  subluxation.1 IMPRESSION: 1. Large right flank hematoma measuring 5.9 x 8.1 cm and associated significant flank edema. 2. Small right pleural effusion. 3. No evidence for acute fracture. 4. Contrast within the collecting systems and urinary bladder following recent intravenous contrast administration. 5.  Aortic atherosclerosis. 6. Prostatic enlargement. Electronically Signed   By: Norva Pavlov M.D.   On: 02/26/2017 19:09   Dg Chest 2 View  Result Date: 02/26/2017 CLINICAL DATA:  Syncopal episode with resultant fall down stairs this morning. Current smoker, alcohol abuse. EXAM: CHEST  2 VIEW COMPARISON:  Portable chest x-ray dated February 20, 2017 FINDINGS: The lungs are mildly hypoinflated which is accentuated by the lordotic positioning. There is no evidence of acute infiltrate or pulmonary contusion. There is no pneumothorax or pleural effusion. The heart and pulmonary vascularity and mediastinal structures are normal. The observed bony structures exhibit no definite acute abnormality. There is widening of the right [facility] joint which has appeared since the previous study. IMPRESSION: There is no acute cardiopulmonary abnormality. New widening of the right [facility] joint may be acute. Correlation with any symptoms over the right [facility] joint is needed. Electronically Signed   By: David  [place] M.D.   On: 02/26/2017 09:33   Dg Shoulder Right  Result Date: 02/27/2017 CLINICAL DATA:  Sharp pain.  Fall. EXAM: RIGHT SHOULDER - 2+ VIEW COMPARISON:  Chest x-ray 02/27/2017.  CT chest 02/26/2017. FINDINGS: Acromioclavicular separation cannot be excluded. No evidence of fracture or dislocation. Atelectatic changes right lung base again identified, follow-up its chest x-rays demonstrate clearing suggested. IMPRESSION: 1.  Right acromioclavicular separation cannot be excluded . 2. Right base atelectatic changes again noted. Follow-up chest x-rays to demonstrate clearing suggested. Electronically Signed   By: Maisie Fus  Register   On:  02/27/2017 09:07   Ct Head Wo Contrast  Result Date: 02/26/2017 CLINICAL DATA:  Syncope with fall EXAM: CT HEAD WITHOUT CONTRAST CT CERVICAL SPINE WITHOUT CONTRAST TECHNIQUE: Multidetector CT imaging of the head and cervical spine was performed following  the standard protocol without intravenous contrast. Multiplanar CT image reconstructions of the cervical spine were also generated. COMPARISON:  Head CT February 20, 2017 FINDINGS: CT HEAD FINDINGS Brain: Mild diffuse atrophy is stable. There is no intracranial mass, hemorrhage, extra-axial fluid collection, or midline shift. There is mild patchy small vessel disease in the centra semiovale bilaterally. There is no new gray-white compartment lesion. No acute infarct evident. Vascular: No demonstrable hyperdense vessel. There is slight calcification in each cavernous carotid artery. Next Skull: Bony calvarium appears intact. There is, however, an acute appearing nondisplaced fracture of the anterior right zygomatic arch. Sinuses/Orbits: There is mild mucosal thickening in several ethmoid air cells bilaterally. There is mucosal thickening in the inferior right maxillary antrum. Other paranasal sinuses which are visualized are clear. Orbits appear symmetric bilaterally. Other: Mastoid air cells are clear. CT CERVICAL SPINE FINDINGS Alignment: There is no spondylolisthesis. Skull base and vertebrae: Skullbase and craniocervical junction regions appear normal. There is no fracture in the cervical region. No blastic or lytic bone lesions. Fracture of the right zygomatic arch described in the head CT report is noted. There are also comminuted fractures of the posterior right second rib. Soft tissues and spinal canal: Prevertebral soft tissues and predental space regions are normal. No paraspinous lesions. There is no cord or canal hematoma evident. Disc levels: There is moderately severe disc space narrowing at C4-5 and C6-7. There is moderate disc space narrowing at C5-6.  There is calcification in the anterior ligament at C3-4, C4-5, and C5-6. There are anterior osteophytes at C3, C4, C5, and C6. There is facet hypertrophy with exit foraminal narrowing at C5-6 and C6-7 bilaterally. Question degrees of facet hypertrophy noted at several levels. No frank disc extrusion. Upper chest: Visualized lung regions are clear. No apical pneumothorax evident. Other: None IMPRESSION: CT head: Nondisplaced fracture anterior right zygomatic arch. No skull fracture evident. There is stable atrophy with mild periventricular small vessel disease. No intracranial mass, hemorrhage, or extra-axial fluid collection. There is mild ethmoid air cell disease bilaterally. There is slight carotid artery calcification distally. CT cervical spine: No cervical spine region fracture or spondylolisthesis. There is multilevel arthropathy. There are comminuted fractures of the posterior right second and third ribs as well as a fracture of the right zygomatic arch. There is multifocal cervical spine arthropathy. Electronically Signed   By: Bretta Bang III M.D.   On: 02/26/2017 09:27   Ct Head Wo Contrast  Result Date: 02/20/2017 CLINICAL DATA:  Multiple falls with head injury EXAM: CT HEAD WITHOUT CONTRAST TECHNIQUE: Contiguous axial images were obtained from the base of the skull through the vertex without intravenous contrast. COMPARISON:  None. FINDINGS: Brain: No definite acute territorial infarction, hemorrhage or focal mass lesion is visualized. Mild periventricular white matter hypodensity suspect secondary to small vessel ischemic changes. Mild cortical atrophy. Slight ventricular prominence since, likely related to the presence of atrophy. Vascular: Slightly dense appearing proximal left MCA, suspect that this is secondary to vessel tortuosity. Scattered calcifications at the carotid siphons. Skull: No fracture or suspicious bone lesion. Sinuses/Orbits: Minimal mucosal thickening in the maxillary and  ethmoid sinuses. No acute orbital abnormality. Other: None IMPRESSION: No definite CT evidence for acute intracranial abnormality. Mild periventricular white matter small vessel ischemic changes. Electronically Signed   By: Jasmine Pang M.D.   On: 02/20/2017 16:30   Ct Angio Chest Pe W/cm &/or Wo Cm  Result Date: 02/26/2017 CLINICAL DATA:  Mid Chest pain; syncope with fall this morning EXAM: CT  ANGIOGRAPHY CHEST WITH CONTRAST TECHNIQUE: Multidetector CT imaging of the chest was performed using the standard protocol during bolus administration of intravenous contrast. Multiplanar CT image reconstructions and MIPs were obtained to evaluate the vascular anatomy. CONTRAST:  100 cc Isovue 370 COMPARISON:  Chest x-ray 02/26/2017 FINDINGS: Cardiovascular: Heart size is normal. No imaged pericardial effusion or significant coronary artery calcifications. Pulmonary arteries are well opacified and there is no acute pulmonary embolus. There is minimal atherosclerotic calcification of the thoracic aorta. Mediastinum/Nodes: Normal appearance of the esophagus. No significant mediastinal, hilar, or axillary adenopathy. The visualized portion of the thyroid gland has a normal appearance. Lungs/Pleura: Small right pleural effusion. There is smooth septal thickening primarily involving the dependent aspects of the lungs. No suspicious pulmonary nodules. No frank consolidations. Upper Abdomen: Gallbladder is present. Musculoskeletal: Mild mid thoracic spondylosis. Review of the MIP images confirms the above findings. IMPRESSION: 1. Technically adequate exam showing no acute pulmonary embolus. 2. Mild changes of pulmonary edema with small right pleural effusion. 3. Mild thoracic atherosclerosis. 4. Mid thoracic spondylosis. Electronically Signed   By: Norva Pavlov M.D.   On: 02/26/2017 13:03   Ct Cervical Spine Wo Contrast  Result Date: 02/26/2017 CLINICAL DATA:  Syncope with fall EXAM: CT HEAD WITHOUT CONTRAST CT CERVICAL  SPINE WITHOUT CONTRAST TECHNIQUE: Multidetector CT imaging of the head and cervical spine was performed following the standard protocol without intravenous contrast. Multiplanar CT image reconstructions of the cervical spine were also generated. COMPARISON:  Head CT February 20, 2017 FINDINGS: CT HEAD FINDINGS Brain: Mild diffuse atrophy is stable. There is no intracranial mass, hemorrhage, extra-axial fluid collection, or midline shift. There is mild patchy small vessel disease in the centra semiovale bilaterally. There is no new gray-white compartment lesion. No acute infarct evident. Vascular: No demonstrable hyperdense vessel. There is slight calcification in each cavernous carotid artery. Next Skull: Bony calvarium appears intact. There is, however, an acute appearing nondisplaced fracture of the anterior right zygomatic arch. Sinuses/Orbits: There is mild mucosal thickening in several ethmoid air cells bilaterally. There is mucosal thickening in the inferior right maxillary antrum. Other paranasal sinuses which are visualized are clear. Orbits appear symmetric bilaterally. Other: Mastoid air cells are clear. CT CERVICAL SPINE FINDINGS Alignment: There is no spondylolisthesis. Skull base and vertebrae: Skullbase and craniocervical junction regions appear normal. There is no fracture in the cervical region. No blastic or lytic bone lesions. Fracture of the right zygomatic arch described in the head CT report is noted. There are also comminuted fractures of the posterior right second rib. Soft tissues and spinal canal: Prevertebral soft tissues and predental space regions are normal. No paraspinous lesions. There is no cord or canal hematoma evident. Disc levels: There is moderately severe disc space narrowing at C4-5 and C6-7. There is moderate disc space narrowing at C5-6. There is calcification in the anterior ligament at C3-4, C4-5, and C5-6. There are anterior osteophytes at C3, C4, C5, and C6. There is facet  hypertrophy with exit foraminal narrowing at C5-6 and C6-7 bilaterally. Question degrees of facet hypertrophy noted at several levels. No frank disc extrusion. Upper chest: Visualized lung regions are clear. No apical pneumothorax evident. Other: None IMPRESSION: CT head: Nondisplaced fracture anterior right zygomatic arch. No skull fracture evident. There is stable atrophy with mild periventricular small vessel disease. No intracranial mass, hemorrhage, or extra-axial fluid collection. There is mild ethmoid air cell disease bilaterally. There is slight carotid artery calcification distally. CT cervical spine: No cervical spine region fracture or  spondylolisthesis. There is multilevel arthropathy. There are comminuted fractures of the posterior right second and third ribs as well as a fracture of the right zygomatic arch. There is multifocal cervical spine arthropathy. Electronically Signed   By: Bretta BangWilliam  Woodruff III M.D.   On: 02/26/2017 09:27   Mr Brain Wo Contrast  Result Date: 02/26/2017 CLINICAL DATA:  Syncopal episode at home. LEFT chest pain. History of migraines, chronic alcohol abuse, hypertension. EXAM: MRI HEAD WITHOUT CONTRAST TECHNIQUE: Multiplanar, multiecho pulse sequences of the brain and surrounding structures were obtained without intravenous contrast. COMPARISON:  CT HEAD Feb 26, 2017 at 0906 hours FINDINGS: Severely limited FLAIR sequence due to artifact. BRAIN: No reduced diffusion to suggest acute ischemia. No susceptibility artifact to suggest hemorrhage. Moderate to severe ventriculomegaly on the basis of parenchymal brain volume loss as there is overall commensurate enlargement of cerebral sulci and cerebellar folia. No suspicious parenchymal signal, masses or mass effect. Patchy supratentorial white matter FLAIR T2 hyperintensities. No abnormal extra-axial fluid collections. LEFT inferior cerebellar developmental venous anomaly, benign finding. VASCULAR: Normal major intracranial  vascular flow voids present at skull base. SKULL AND UPPER CERVICAL SPINE: No abnormal sellar expansion. No suspicious calvarial bone marrow signal. Craniocervical junction maintained. SINUSES/ORBITS: The mastoid air-cells and included paranasal sinuses are well-aerated. The included ocular globes and orbital contents are non-suspicious. OTHER: None. IMPRESSION: No acute intracranial process. Moderate to severe parenchymal brain volume loss and mild chronic small vessel ischemic disease. Electronically Signed   By: Awilda Metroourtnay  Bloomer M.D.   On: 02/26/2017 18:34   Ct Abdomen Pelvis W Contrast  Result Date: 02/20/2017 CLINICAL DATA:  Initial evaluation for acute abdominal pain, fever, weakness, nausea. EXAM: CT ABDOMEN AND PELVIS WITH CONTRAST TECHNIQUE: Multidetector CT imaging of the abdomen and pelvis was performed using the standard protocol following bolus administration of intravenous contrast. CONTRAST:  100mL ISOVUE-300 IOPAMIDOL (ISOVUE-300) INJECTION 61% COMPARISON:  Prior CT from 06/04/2016. FINDINGS: Lower chest: Mild bibasilar atelectatic changes. Visualized lung bases are otherwise clear. Hepatobiliary: Diffuse hypoattenuation liver consistent with steatosis. Liver otherwise unremarkable. Gallbladder somewhat prominent. Minimal hazy pericholecystic hazy stranding/edema. No internal cholelithiasis. No biliary dilatation. Pancreas: Pancreas within normal limits. Spleen: Spleen within normal limits. Adrenals/Urinary Tract: Adrenal glands are normal. Kidneys equal in size with symmetric enhancement. No nephrolithiasis, hydronephrosis, or focal enhancing renal mass. No hydroureter. Bladder fairly distended without acute abnormality. Stomach/Bowel: Stomach within normal limits. No evidence for bowel obstruction appendix within normal limits. Sequelae of prior partial colectomy with surgical anastomosis within the sigmoid colon noted. No acute inflammatory changes seen about the bowels. Vascular/Lymphatic:  Mild aorto bi-iliac atherosclerotic disease. No aneurysm. For opacification of the IMA proximally (series 3, image 47). Remaining branch vessels are well opacified and within normal limits. No pathologically enlarged intra-abdominal or pelvic lymph nodes. Reproductive: Prostate mildly enlarged measuring 5.2 cm in transverse diameter, similar to previous. Other: No free air or fluid. Musculoskeletal: No acute osseous abnormality. No worrisome lytic or blastic osseous lesions. Scattered multilevel degenerative spondylolysis and facet arthrosis noted within the visualized spine. IMPRESSION: 1. Mildly prominent gallbladder with trace pericholecystic stranding/edema. Correlation with laboratory values for possible acute gallbladder pathology recommended. Additionally, further evaluation with dedicated right upper quadrant ultrasound could be performed for further evaluation as warranted. 2. Poor opacification of the proximal IMA. Findings of uncertain significance, and may be simply related to technique on this non angiographic examination. Correlation with physical exam and serum lactate recommended for potential signs of intestinal ischemia. No other secondary signs to suggest acute  bowel ischemia identified on this exam. 3. Hepatic steatosis. 4. Mildly enlarged prostate. Prominent urinary bladder distension may reflect a degree of underlying outlet obstruction. Electronically Signed   By: Rise Mu M.D.   On: 02/20/2017 19:16   Ct T-spine No Charge  Result Date: 02/27/2017 CLINICAL DATA:  Fall yesterday.  Upper back pain. EXAM: CT THORACIC SPINE WITHOUT CONTRAST TECHNIQUE: Multidetector CT images of the thoracic were obtained using the standard protocol without intravenous contrast. COMPARISON:  None. FINDINGS: Alignment: Normal Vertebrae: No fracture Paraspinal and other soft tissues: No epidural or paraspinal soft tissue swelling/hematoma. Disc levels: The disc space narrowing and spurring in the mid  and lower thoracic spine. IMPRESSION: No evidence of thoracic fracture.  Mild spondylosis. Electronically Signed   By: Charlett Nose M.D.   On: 02/27/2017 10:43   Dg Chest Port 1 View  Result Date: 02/27/2017 CLINICAL DATA:  Followup for pulmonary edema/pleural effusion. Patient with cough and dyspnea. Patient fell yesterday. EXAM: PORTABLE CHEST 1 VIEW COMPARISON:  02/26/2017 FINDINGS: Basilar lung opacity, right greater left, has increased since the prior exam, consistent with atelectasis. Remainder of the lungs is clear. The pleural effusions evident on the previous day's CT is not resolved radiographically. No pneumothorax. Cardiac silhouette is normal in size. No mediastinal or hilar masses. IMPRESSION: 1. Mild increase in lung base opacity since the previous day's studies consistent with atelectasis. No convincing pneumonia or pulmonary edema. No pneumothorax. Electronically Signed   By: Amie Portland M.D.   On: 02/27/2017 07:46   Dg Chest Port 1 View  Result Date: 02/20/2017 CLINICAL DATA:  Weakness, malaise and falls for 1 week. EXAM: PORTABLE CHEST 1 VIEW COMPARISON:  09/13/2016 and prior radiographs FINDINGS: The cardiomediastinal silhouette is unremarkable. There is no evidence of focal airspace disease, pulmonary edema, suspicious pulmonary nodule/mass, pleural effusion, or pneumothorax. No acute bony abnormalities are identified. IMPRESSION: No active disease. Electronically Signed   By: Harmon Pier M.D.   On: 02/20/2017 16:56    Time Spent in minutes  30   Susa Raring M.D on 02/27/2017 at 11:14 AM  Between 7am to 7pm - Pager - [phone] ( page via [email or web address], text pages only, please mention full 10 digit call back number). After 7pm go to [email or web address] - password [email or web address]

## 2017-02-27 NOTE — Progress Notes (Signed)
PT Cancellation Note  Patient Details Name: Alvin Benton MRN: [id number] DOB: Jan 09, 1951   Cancelled Treatment:    Reason Eval/Treat Not Completed: Pain limiting ability to participate.  Max encouragement provided, including benefits of mobility.  Patient declined PT services today due to pain.  Will return at later time.   Vena AustriaSusan H Kymoni Lesperance 02/27/2017, 10:25 AM Durenda HurtSusan H. Renaldo Fiddleravis, PT, [facility] Acute Rehab Services Pager [phone]

## 2017-02-27 NOTE — Progress Notes (Signed)
Pt asymptomatic, unable to obtain orthostatics at this time. Will continue to monitor.  Bertran Zeimet, RN

## 2017-02-27 NOTE — Clinical Social Work Note (Signed)
Clinical Social Work Assessment  Patient Details  Name: Alvin Benton MRN: [id number] Date of Birth: 1951-08-06  Date of referral:  02/27/17               Reason for consult:  Substance Use/ETOH Abuse                Permission sought to share information with:    Permission granted to share information::  No  Name::        Agency::     Relationship::     Contact Information:     Housing/Transportation Living arrangements for the past 2 months:  Apartment Source of Information:  Patient, Medical Team Patient Interpreter Needed:  None Criminal Activity/Legal Involvement Pertinent to Current Situation/Hospitalization:  No - Comment as needed Significant Relationships:  Friend Lives with:  Self Do you feel safe going back to the place where you live?  Yes Need for family participation in patient care:  Yes (Comment)  Care giving concerns:  Patient in need of resources for substance abuse treatment.   Social Worker assessment / plan:  CSW met with patient. No supports at bedside. CSW introduced role and inquired about interest in substance abuse resources. Patient confirmed, stating "it's time." Resources included both inpatient and outpatient treatment centers. No further concerns. CSW signing off as social work intervention is no longer needed.  Employment status:  [employer] information:  Other (Comment Required) Psychologist, counselling) PT Recommendations:  Not assessed at this time Information / Referral to community resources:  Residential Substance Abuse Treatment Options, Outpatient Substance Abuse Treatment Options  Patient/Family's Response to care:  Patient agreeable to receiving substance abuse resources. Patient's friend supportive and involved in patient's care. Patient appreciated social work intervention.  Patient/Family's Understanding of and Emotional Response to Diagnosis, Current Treatment, and Prognosis:  Patient has a good understanding of the reason for admission and for  the social work consult. When discussing substance abuse treatment, he replied, "it's time." Patient appears happy with hospital care.  Emotional Assessment Appearance:  Appears stated age Attitude/Demeanor/Rapport:  Other (Pleasant) Affect (typically observed):  Accepting, Appropriate, Calm, Pleasant Orientation:  Oriented to Self, Oriented to Place, Oriented to  Time, Oriented to Situation Alcohol / Substance use:  Alcohol Use, Tobacco Use Psych involvement (Current and /or in the community):  No (Comment)  Discharge Needs  Concerns to be addressed:  Care Coordination Readmission within the last 30 days:  Yes Current discharge risk:  Substance Abuse Barriers to Discharge:  Active Substance Use, Continued Medical Work up   Candie Chroman, LCSW 02/27/2017, 11:31 AM

## 2017-02-27 NOTE — Progress Notes (Signed)
Unable to obtain orthostatic vitals at this time. Patient refuses to sit up or stand up. States he is in pain. Nurse medicated. Patient continues to refuse. Passed on to day shift nurse.

## 2017-02-28 ENCOUNTER — Inpatient Hospital Stay (HOSPITAL_COMMUNITY): Payer: Managed Care, Other (non HMO)

## 2017-02-28 LAB — CBC
HCT: 28.8 % — ABNORMAL LOW (ref 39.0–52.0)
HEMOGLOBIN: 9.7 g/dL — AB (ref 13.0–17.0)
MCH: 32 pg (ref 26.0–34.0)
MCHC: 33.7 g/dL (ref 30.0–36.0)
MCV: 95 fL (ref 78.0–100.0)
Platelets: 351 10*3/uL (ref 150–400)
RBC: 3.03 MIL/uL — AB (ref 4.22–5.81)
RDW: 13.5 % (ref 11.5–15.5)
WBC: 8.4 10*3/uL (ref 4.0–10.5)

## 2017-02-28 LAB — BASIC METABOLIC PANEL
ANION GAP: 9 (ref 5–15)
BUN: 7 mg/dL (ref 6–20)
CHLORIDE: 98 mmol/L — AB (ref 101–111)
CO2: 25 mmol/L (ref 22–32)
Calcium: 8.8 mg/dL — ABNORMAL LOW (ref 8.9–10.3)
Creatinine, Ser: 0.76 mg/dL (ref 0.61–1.24)
Glucose, Bld: 117 mg/dL — ABNORMAL HIGH (ref 65–99)
POTASSIUM: 4.3 mmol/L (ref 3.5–5.1)
SODIUM: 132 mmol/L — AB (ref 135–145)

## 2017-02-28 LAB — PHOSPHORUS: PHOSPHORUS: 3.5 mg/dL (ref 2.5–4.6)

## 2017-02-28 LAB — PROCALCITONIN: Procalcitonin: <0.1 ng/mL

## 2017-02-28 LAB — MAGNESIUM: MAGNESIUM: 1.9 mg/dL (ref 1.7–2.4)

## 2017-02-28 NOTE — Evaluation (Signed)
Physical Therapy Evaluation Patient Details Name: Alvin Benton MRN: [id number] DOB: [birth year] Today's Date: 02/28/2017   History of Present Illness  Pt is a 66 yo male admitted following a syncopal episode in a stairwell on 02/26/17. Fall is suspected to be related to ETOH abuse as pt was intoxicated when arrived at the hospital. Imaging has ruled out fractures in right shoulder, ribs and spine and no acute findings of brain. Ortho consulted and will get another CT of right shoulder with supsect injury. PMH significant for ETOH abuse, GERD, HTN, IBS.   Clinical Impression  Pt presents with the above diagnosis and below deficits for therapy evaluation. Prior to admission, pt was living alone and works full time. Pt admits to being a heavy drinker and had a significant fall resulting in current injuries. Pt require Min to Mod A for a majority of mobility and was unable to perform any gait this session due to pain and fatigue. Pt will benefit from continued acute PT services in order to address the below deficits prior to discharge to venue recommended below.     Follow Up Recommendations SNF;Supervision/Assistance - 24 hour    Equipment Recommendations  None recommended by PT    Recommendations for Other Services       Precautions / Restrictions Precautions Precautions: Fall Precaution Comments: alcohol withdrawl Restrictions Weight Bearing Restrictions: No      Mobility  Bed Mobility Overal bed mobility: Needs Assistance Bed Mobility: Supine to Sit     Supine to sit: Min assist     General bed mobility comments: Min A with use of railing to get to EOB  Transfers Overall transfer level: Needs assistance Equipment used: 2 person hand held assist Transfers: Sit to/from [employer] Sit to Stand: Min assist Stand pivot transfers: +2 safety/equipment;Mod assist       General transfer comment: Min A to stand as RN performed orthostatic vitals. Mod A for stand  pivot transfer from bed to recliner with WBOS and difficulty to maneuver LE's to transfer into chair.    Ambulation/Gait             General Gait Details: No performed this session  Stairs            Wheelchair Mobility    Modified Rankin (Stroke Patients Only)       Balance Overall balance assessment: Needs assistance Sitting-balance support: No upper extremity supported;Feet supported Sitting balance-Leahy Scale: Fair     Standing balance support: Single extremity supported;During functional activity Standing balance-Leahy Scale: Poor                               Pertinent Vitals/Pain Pain Assessment: 0-10 Pain Score: 8  Pain Location: right shoulder and low back Pain Descriptors / Indicators: Grimacing;Guarding;Sharp Pain Intervention(s): Monitored during session;Patient requesting pain meds-RN notified    Home Living Family/patient expects to be discharged to:: Private residence Living Arrangements: Alone Available Help at Discharge: Friend(s) Type of Home: Apartment Home Access: Stairs to enter Entrance Stairs-Rails: Right Entrance Stairs-Number of Steps: 12 Home Layout: One level Home Equipment: Cane - single point      Prior Function Level of Independence: Independent         Comments: 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      Hand Dominance   Dominant Hand: Right    Extremity/Trunk Assessment   Upper Extremity Assessment Upper Extremity  Assessment: Defer to OT evaluation    Lower Extremity Assessment Lower Extremity Assessment: Generalized weakness    Cervical / Trunk Assessment Cervical / Trunk Assessment: Normal  Communication   Communication: No difficulties  Cognition Arousal/Alertness: Awake/alert Behavior During Therapy: WFL for tasks assessed/performed;Agitated Overall Cognitive Status: Within Functional Limits for tasks assessed                                  General Comments: Pt agitated and has tremors      General Comments General comments (skin integrity, edema, etc.): Pt is anxious and shakey throught session.     Exercises     Assessment/Plan    PT Assessment Patient needs continued PT services  PT Problem List Decreased activity tolerance;Decreased balance;Decreased mobility;Decreased strength;Pain       PT Treatment Interventions DME instruction;Gait training;Stair training;Functional mobility training;Therapeutic activities;Therapeutic exercise;Balance training;Patient/family education    PT Goals (Current goals can be found in the Care Plan section)  Acute Rehab PT Goals Patient Stated Goal: to have less pain.  PT Goal Formulation: With patient Time For Goal Achievement: 03/07/17 Potential to Achieve Goals: Fair    Frequency Min 3X/week   Barriers to discharge        Co-evaluation               AM-PAC PT "6 Clicks" Daily Activity  Outcome Measure Difficulty turning over in bed (including adjusting bedclothes, sheets and blankets)?: Total Difficulty moving from lying on back to sitting on the side of the bed? : Total Difficulty sitting down on and standing up from a chair with arms (e.g., wheelchair, bedside commode, etc,.)?: A Lot Help needed moving to and from a bed to chair (including a wheelchair)?: A Lot Help needed walking in hospital room?: A Lot Help needed climbing 3-5 steps with a railing? : A Lot 6 Click Score: 10    End of Session Equipment Utilized During Treatment: Gait belt Activity Tolerance: Patient limited by pain Patient left: in chair;with call bell/phone within reach;with chair alarm set Nurse Communication: Mobility status PT Visit Diagnosis: Unsteadiness on feet (R26.81);Difficulty in walking, not elsewhere classified (R26.2);Pain Pain - Right/Left: Right Pain - part of body: Shoulder    Time: [id number] PT Time Calculation (min) (ACUTE ONLY): 33 min   Charges:   PT  Evaluation $PT Eval Moderate Complexity: 1 Procedure PT Treatments $Therapeutic Activity: 8-22 mins   PT G Codes:        Colin BroachSabra M. Izzabella Besse PT, DPT  [phone]   Ruel FavorsSabra Aletha HalimMarie Jazzmyne Rasnick 02/28/2017, 1:20 PM

## 2017-02-28 NOTE — Progress Notes (Signed)
PROGRESS NOTE                                                                                                                                                                                                             Patient Demographics:    Alvin Benton, is a 66 y.o. male, DOB - 05-27-1951, ZOX:[id number]  Admit date - 02/26/2017   Admitting Physician Ozella Rocks, MD  Outpatient Primary MD for the patient is Marilynn Rail, MD  LOS - 2  Chief Complaint  Patient presents with  . Fall  . Chest Pain       Brief Narrative  Alvin Benton is a 66 y.o. male with medical history significant of EtOH abuse, GERD, hypertension, migraines, IBS, tobacco abuse. Patient presenting to [facility] emergency room after syncopal episode, apparently he was leaving for work and fell through a flight of stairs, he does not recall passing out, he did say that he had some dizziness prior to the fall with sensation of some spinning, note his alcohol level was extremely high upon admission and he now except that he drank before the fall. He says he drinks on a daily basis and he drinks a lot.   Subjective:   Patient in bed, appears comfortable, denies any headache, no fever, no chest pain or pressure, no shortness of breath , no abdominal pain. No focal weakness. +ve R shoulder pain.   Assessment  & Plan :     1.Syncope and fall. Most likely mechanical fall due to alcohol intoxication. CT head, CT C-spine, MRI brain all nonacute. He has no focal deficits, no headache at this time. Check orthostatics, had stable echocardiogram, PT to evaluate likely discharge in the morning.  2. Alcohol abuse. Remains at risk for DTs, currently on scheduled Librium along with as needed CIWA protocol, counseled to quit alcohol, will request social work evaluation for help if needed.  3. Right shoulder grade 3 AC separation and coracoclavicular ligament tear.  Right flank hematoma. Due to the fall at home, supportive care orthopedics consulted to provide sliding, await PT OT eval.  4. Hypertension. On scheduled Coreg and hydralazine along with as needed IV hydralazine.  5. Paroxysmal atrial fibrillation. [place] vasc 2 score of at least 2. History of alcohol binging and multiple falls, poor candidate for anticoagulation, place on aspirin 81 mg  along with Coreg and monitor.  6. Fractures: non-displaced R zygomatic fracture and R 2nd and 3rd proximal comminuted fractures noted. Correlates w/ pts complaints of back/chest pain, supportive care.  7. Atelectasis with small left pleural effusion. Add an incentive spirometry. Clinically no CHF.    Diet : Diet regular Room service appropriate? Yes; Fluid consistency: Thin    Family Communication  :  None  Code Status :  Full  Disposition Plan  :  TBD  Consults  :    Procedures  :    CT head, CT C-spine, MRI brain. All nonacute.  MRI brain  CT right shoulder  DVT Prophylaxis  :   SCDs    Lab Results  Component Value Date   PLT 351 02/28/2017    Inpatient Medications  Scheduled Meds: . aspirin  81 mg Oral Daily  . carvedilol  6.25 mg Oral BID WC  . chlordiazePOXIDE  10 mg Oral TID  . folic acid  1 mg Oral Daily  . hydrALAZINE  50 mg Oral Q8H  . multivitamin with minerals  1 tablet Oral Daily  . thiamine  100 mg Oral Daily   Continuous Infusions: . dextrose 5 % and 0.45% NaCl 50 mL/hr at 02/27/17 1549   PRN Meds:.acetaminophen, albuterol, fentaNYL (SUBLIMAZE) injection, hydrALAZINE, ketorolac, LORazepam **OR** LORazepam, nicotine, ondansetron **OR** ondansetron (ZOFRAN) IV, oxyCODONE  Antibiotics  :    Anti-infectives    None         Objective:   Vitals:   02/27/17 1414 02/27/17 2142 02/28/17 0054 02/28/17 0600  BP: 125/66 (!) 145/82 (!) 153/64 (!) 151/73  Pulse: 80 75 72 70  Resp:  20  18  Temp:  98.7 F (37.1 C)  97.3 F (36.3 C)  TempSrc:  Oral  Oral  SpO2:  99%   96%  Weight:      Height:        Wt Readings from Last 3 Encounters:  02/27/17 75.3 kg (166 lb)  02/20/17 76.8 kg (169 lb 5 oz)  10/03/16 71.2 kg (157 lb)     Intake/Output Summary (Last 24 hours) at 02/28/17 0906 Last data filed at 02/28/17 [id number]  Gross per 24 hour  Intake           1667.5 ml  Output             1825 ml  Net           -157.5 ml     Physical Exam  Awake Alert, Oriented X 3, No new F.N deficits, Anxious affect, +ve mild tremors .AT,PERRAL Supple Neck,No JVD, No cervical lymphadenopathy appriciated.  Symmetrical Chest wall movement, Good air movement bilaterally, CTAB RRR,No Gallops,Rubs or new Murmurs, No Parasternal Heave +ve B.Sounds, Abd Soft, No tenderness, No organomegaly appriciated, No rebound - guarding or rigidity. No Cyanosis, Clubbing or edema, No new Rash, multiple old bruises Painful range of motion in the right shoulder    Data Review:    CBC  Recent Labs Lab 02/26/17 0835 02/26/17 1648 02/27/17 0038 02/27/17 0355 02/28/17 0328  WBC 10.1  --   --  9.2 8.4  HGB 12.0* 12.5* 10.9* 10.4* 9.7*  HCT 34.6* 36.5* 32.1* 31.1* 28.8*  PLT 380  --   --  380 351  MCV 93.5  --   --  94.5 95.0  MCH 32.4  --   --  31.6 32.0  MCHC 34.7  --   --  33.4 33.7  RDW 13.1  --   --  13.4 13.5  LYMPHSABS 1.3  --   --   --   --   MONOABS 0.8  --   --   --   --   EOSABS 0.1  --   --   --   --   BASOSABS 0.1  --   --   --   --     Chemistries   Recent Labs Lab 02/22/17 1417 02/22/17 2006 02/26/17 0835 02/27/17 0355 02/28/17 0328  NA 125* 124* 137 133* 132*  K 3.2* 3.3* 3.7 4.0 4.3  CL 91* 89* 103 97* 98*  CO2 26 27 26 26 25   GLUCOSE 132* 106* 101* 110* 117*  BUN 10 9 6 11 7   CREATININE 0.84 0.83 0.81 0.80 0.76  CALCIUM 8.2* 8.4* 9.1 8.5* 8.8*  MG  --   --   --   --  1.9  AST  --   --  52* 37  --   ALT  --   --  44 32  --   ALKPHOS  --   --  41 44  --   BILITOT  --   --  0.2* 0.9  --     ------------------------------------------------------------------------------------------------------------------ No results for input(s): CHOL, HDL, LDLCALC, TRIG, CHOLHDL, LDLDIRECT in the last 72 hours.  Lab Results  Component Value Date   HGBA1C 4.4 (L) 08/27/2016   ------------------------------------------------------------------------------------------------------------------ No results for input(s): TSH, T4TOTAL, T3FREE, THYROIDAB in the last 72 hours.  Invalid input(s): FREET3 ------------------------------------------------------------------------------------------------------------------ No results for input(s): VITAMINB12, FOLATE, FERRITIN, TIBC, IRON, RETICCTPCT in the last 72 hours.  Coagulation profile No results for input(s): INR, PROTIME in the last 168 hours.   Recent Labs  02/26/17 0835  DDIMER 11.40*    Cardiac Enzymes  Recent Labs Lab 02/26/17 0835  TROPONINI <0.03   ------------------------------------------------------------------------------------------------------------------    Component Value Date/Time   BNP 156.2 (H) 02/26/2017 1356    Micro Results Recent Results (from the past 240 hour(s))  Blood Culture (routine x 2)     Status: None   Collection Time: 02/20/17  4:30 PM  Result Value Ref Range Status   Specimen Description BLOOD RIGHT FOREARM  Final   Special Requests   Final    BOTTLES DRAWN AEROBIC AND ANAEROBIC Blood Culture adequate volume   Culture NO GROWTH 5 DAYS  Final   Report Status 02/25/2017 FINAL  Final  Blood Culture (routine x 2)     Status: None   Collection Time: 02/20/17  6:30 PM  Result Value Ref Range Status   Specimen Description BLOOD LEFT FOREARM  Final   Special Requests   Final    BOTTLES DRAWN AEROBIC AND ANAEROBIC Blood Culture adequate volume   Culture NO GROWTH 5 DAYS  Final   Report Status 02/25/2017 FINAL  Final    Radiology Reports Ct Abdomen Pelvis Wo Contrast  Result Date:  02/26/2017 CLINICAL DATA:  Pt states he fell yesterday and now has pain on right side of abdomen. Pt endorses n/v, denies diarrhea. HTN, IBS, diverticulitis EXAM: CT ABDOMEN AND PELVIS WITHOUT CONTRAST TECHNIQUE: Multidetector CT imaging of the abdomen and pelvis was performed following the standard protocol without IV contrast. COMPARISON:  CT of abdomen and pelvis 02/20/2017 FINDINGS: Lower chest: Interval development of small right pleural effusion. There is bibasilar atelectasis. Heart size is normal. Hepatobiliary: No focal liver abnormality is seen. No gallstones, gallbladder wall thickening, or biliary dilatation. Pancreas: Unremarkable. No pancreatic ductal dilatation or surrounding inflammatory changes. Spleen: Normal in size  without focal abnormality. Adrenals/Urinary Tract: Adrenals are normal in appearance. Normal appearance of both kidneys. Contrast is identified in the collecting systems following prior contrast exam. Urinary bladder is normal in appearance, opacified with contrast following recent contrast exam. Stomach/Bowel: The stomach and small bowel loops are normal in appearance. The appendix is well seen and has a normal appearance. Colon contains moderate stool burden. No evidence for obstruction. Suture line identified in the rectosigmoid colon. Vascular/Lymphatic: There is atherosclerotic calcification of the abdominal aorta. No retroperitoneal or mesenteric adenopathy. Reproductive: Prostate is enlarged. Other: Within the region of the right flank, there is significant edema and subcutaneous stranding, new since the most recent CT exam. The hematoma measures 5.9 x 8.1 cm. There is no underlying fracture. Musculoskeletal: Mild degenerative changes are seen in thoracic and lumbar spine. No acute fracture or subluxation.1 IMPRESSION: 1. Large right flank hematoma measuring 5.9 x 8.1 cm and associated significant flank edema. 2. Small right pleural effusion. 3. No evidence for acute fracture. 4.  Contrast within the collecting systems and urinary bladder following recent intravenous contrast administration. 5.  Aortic atherosclerosis. 6. Prostatic enlargement. Electronically Signed   By: Norva Pavlov M.D.   On: 02/26/2017 19:09   Dg Chest 2 View  Result Date: 02/26/2017 CLINICAL DATA:  Syncopal episode with resultant fall down stairs this morning. Current smoker, alcohol abuse. EXAM: CHEST  2 VIEW COMPARISON:  Portable chest x-ray dated February 20, 2017 FINDINGS: The lungs are mildly hypoinflated which is accentuated by the lordotic positioning. There is no evidence of acute infiltrate or pulmonary contusion. There is no pneumothorax or pleural effusion. The heart and pulmonary vascularity and mediastinal structures are normal. The observed bony structures exhibit no definite acute abnormality. There is widening of the right [facility] joint which has appeared since the previous study. IMPRESSION: There is no acute cardiopulmonary abnormality. New widening of the right [facility] joint may be acute. Correlation with any symptoms over the right [facility] joint is needed. Electronically Signed   By: David  [place] M.D.   On: 02/26/2017 09:33   Dg Shoulder Right  Result Date: 02/27/2017 CLINICAL DATA:  Sharp pain.  Fall. EXAM: RIGHT SHOULDER - 2+ VIEW COMPARISON:  Chest x-ray 02/27/2017.  CT chest 02/26/2017. FINDINGS: Acromioclavicular separation cannot be excluded. No evidence of fracture or dislocation. Atelectatic changes right lung base again identified, follow-up its chest x-rays demonstrate clearing suggested. IMPRESSION: 1.  Right acromioclavicular separation cannot be excluded . 2. Right base atelectatic changes again noted. Follow-up chest x-rays to demonstrate clearing suggested. Electronically Signed   By: Maisie Fus  Register   On: 02/27/2017 09:07   Ct Head Wo Contrast  Result Date: 02/26/2017 CLINICAL DATA:  Syncope with fall EXAM: CT HEAD WITHOUT CONTRAST CT CERVICAL SPINE WITHOUT CONTRAST TECHNIQUE:  Multidetector CT imaging of the head and cervical spine was performed following the standard protocol without intravenous contrast. Multiplanar CT image reconstructions of the cervical spine were also generated. COMPARISON:  Head CT February 20, 2017 FINDINGS: CT HEAD FINDINGS Brain: Mild diffuse atrophy is stable. There is no intracranial mass, hemorrhage, extra-axial fluid collection, or midline shift. There is mild patchy small vessel disease in the centra semiovale bilaterally. There is no new gray-white compartment lesion. No acute infarct evident. Vascular: No demonstrable hyperdense vessel. There is slight calcification in each cavernous carotid artery. Next Skull: Bony calvarium appears intact. There is, however, an acute appearing nondisplaced fracture of the anterior right zygomatic arch. Sinuses/Orbits: There is mild mucosal thickening in several ethmoid air  cells bilaterally. There is mucosal thickening in the inferior right maxillary antrum. Other paranasal sinuses which are visualized are clear. Orbits appear symmetric bilaterally. Other: Mastoid air cells are clear. CT CERVICAL SPINE FINDINGS Alignment: There is no spondylolisthesis. Skull base and vertebrae: Skullbase and craniocervical junction regions appear normal. There is no fracture in the cervical region. No blastic or lytic bone lesions. Fracture of the right zygomatic arch described in the head CT report is noted. There are also comminuted fractures of the posterior right second rib. Soft tissues and spinal canal: Prevertebral soft tissues and predental space regions are normal. No paraspinous lesions. There is no cord or canal hematoma evident. Disc levels: There is moderately severe disc space narrowing at C4-5 and C6-7. There is moderate disc space narrowing at C5-6. There is calcification in the anterior ligament at C3-4, C4-5, and C5-6. There are anterior osteophytes at C3, C4, C5, and C6. There is facet hypertrophy with exit foraminal  narrowing at C5-6 and C6-7 bilaterally. Question degrees of facet hypertrophy noted at several levels. No frank disc extrusion. Upper chest: Visualized lung regions are clear. No apical pneumothorax evident. Other: None IMPRESSION: CT head: Nondisplaced fracture anterior right zygomatic arch. No skull fracture evident. There is stable atrophy with mild periventricular small vessel disease. No intracranial mass, hemorrhage, or extra-axial fluid collection. There is mild ethmoid air cell disease bilaterally. There is slight carotid artery calcification distally. CT cervical spine: No cervical spine region fracture or spondylolisthesis. There is multilevel arthropathy. There are comminuted fractures of the posterior right second and third ribs as well as a fracture of the right zygomatic arch. There is multifocal cervical spine arthropathy. Electronically Signed   By: Bretta Bang III M.D.   On: 02/26/2017 09:27   Ct Head Wo Contrast  Result Date: 02/20/2017 CLINICAL DATA:  Multiple falls with head injury EXAM: CT HEAD WITHOUT CONTRAST TECHNIQUE: Contiguous axial images were obtained from the base of the skull through the vertex without intravenous contrast. COMPARISON:  None. FINDINGS: Brain: No definite acute territorial infarction, hemorrhage or focal mass lesion is visualized. Mild periventricular white matter hypodensity suspect secondary to small vessel ischemic changes. Mild cortical atrophy. Slight ventricular prominence since, likely related to the presence of atrophy. Vascular: Slightly dense appearing proximal left MCA, suspect that this is secondary to vessel tortuosity. Scattered calcifications at the carotid siphons. Skull: No fracture or suspicious bone lesion. Sinuses/Orbits: Minimal mucosal thickening in the maxillary and ethmoid sinuses. No acute orbital abnormality. Other: None IMPRESSION: No definite CT evidence for acute intracranial abnormality. Mild periventricular white matter small  vessel ischemic changes. Electronically Signed   By: Jasmine Pang M.D.   On: 02/20/2017 16:30   Ct Angio Chest Pe W/cm &/or Wo Cm  Result Date: 02/26/2017 CLINICAL DATA:  Mid Chest pain; syncope with fall this morning EXAM: CT ANGIOGRAPHY CHEST WITH CONTRAST TECHNIQUE: Multidetector CT imaging of the chest was performed using the standard protocol during bolus administration of intravenous contrast. Multiplanar CT image reconstructions and MIPs were obtained to evaluate the vascular anatomy. CONTRAST:  100 cc Isovue 370 COMPARISON:  Chest x-ray 02/26/2017 FINDINGS: Cardiovascular: Heart size is normal. No imaged pericardial effusion or significant coronary artery calcifications. Pulmonary arteries are well opacified and there is no acute pulmonary embolus. There is minimal atherosclerotic calcification of the thoracic aorta. Mediastinum/Nodes: Normal appearance of the esophagus. No significant mediastinal, hilar, or axillary adenopathy. The visualized portion of the thyroid gland has a normal appearance. Lungs/Pleura: Small right pleural effusion. There  is smooth septal thickening primarily involving the dependent aspects of the lungs. No suspicious pulmonary nodules. No frank consolidations. Upper Abdomen: Gallbladder is present. Musculoskeletal: Mild mid thoracic spondylosis. Review of the MIP images confirms the above findings. IMPRESSION: 1. Technically adequate exam showing no acute pulmonary embolus. 2. Mild changes of pulmonary edema with small right pleural effusion. 3. Mild thoracic atherosclerosis. 4. Mid thoracic spondylosis. Electronically Signed   By: Norva Pavlov M.D.   On: 02/26/2017 13:03   Ct Cervical Spine Wo Contrast  Result Date: 02/26/2017 CLINICAL DATA:  Syncope with fall EXAM: CT HEAD WITHOUT CONTRAST CT CERVICAL SPINE WITHOUT CONTRAST TECHNIQUE: Multidetector CT imaging of the head and cervical spine was performed following the standard protocol without intravenous contrast.  Multiplanar CT image reconstructions of the cervical spine were also generated. COMPARISON:  Head CT February 20, 2017 FINDINGS: CT HEAD FINDINGS Brain: Mild diffuse atrophy is stable. There is no intracranial mass, hemorrhage, extra-axial fluid collection, or midline shift. There is mild patchy small vessel disease in the centra semiovale bilaterally. There is no new gray-white compartment lesion. No acute infarct evident. Vascular: No demonstrable hyperdense vessel. There is slight calcification in each cavernous carotid artery. Next Skull: Bony calvarium appears intact. There is, however, an acute appearing nondisplaced fracture of the anterior right zygomatic arch. Sinuses/Orbits: There is mild mucosal thickening in several ethmoid air cells bilaterally. There is mucosal thickening in the inferior right maxillary antrum. Other paranasal sinuses which are visualized are clear. Orbits appear symmetric bilaterally. Other: Mastoid air cells are clear. CT CERVICAL SPINE FINDINGS Alignment: There is no spondylolisthesis. Skull base and vertebrae: Skullbase and craniocervical junction regions appear normal. There is no fracture in the cervical region. No blastic or lytic bone lesions. Fracture of the right zygomatic arch described in the head CT report is noted. There are also comminuted fractures of the posterior right second rib. Soft tissues and spinal canal: Prevertebral soft tissues and predental space regions are normal. No paraspinous lesions. There is no cord or canal hematoma evident. Disc levels: There is moderately severe disc space narrowing at C4-5 and C6-7. There is moderate disc space narrowing at C5-6. There is calcification in the anterior ligament at C3-4, C4-5, and C5-6. There are anterior osteophytes at C3, C4, C5, and C6. There is facet hypertrophy with exit foraminal narrowing at C5-6 and C6-7 bilaterally. Question degrees of facet hypertrophy noted at several levels. No frank disc extrusion. Upper  chest: Visualized lung regions are clear. No apical pneumothorax evident. Other: None IMPRESSION: CT head: Nondisplaced fracture anterior right zygomatic arch. No skull fracture evident. There is stable atrophy with mild periventricular small vessel disease. No intracranial mass, hemorrhage, or extra-axial fluid collection. There is mild ethmoid air cell disease bilaterally. There is slight carotid artery calcification distally. CT cervical spine: No cervical spine region fracture or spondylolisthesis. There is multilevel arthropathy. There are comminuted fractures of the posterior right second and third ribs as well as a fracture of the right zygomatic arch. There is multifocal cervical spine arthropathy. Electronically Signed   By: Bretta Bang III M.D.   On: 02/26/2017 09:27   Mr Brain Wo Contrast  Result Date: 02/26/2017 CLINICAL DATA:  Syncopal episode at home. LEFT chest pain. History of migraines, chronic alcohol abuse, hypertension. EXAM: MRI HEAD WITHOUT CONTRAST TECHNIQUE: Multiplanar, multiecho pulse sequences of the brain and surrounding structures were obtained without intravenous contrast. COMPARISON:  CT HEAD Feb 26, 2017 at 0906 hours FINDINGS: Severely limited FLAIR sequence due to  artifact. BRAIN: No reduced diffusion to suggest acute ischemia. No susceptibility artifact to suggest hemorrhage. Moderate to severe ventriculomegaly on the basis of parenchymal brain volume loss as there is overall commensurate enlargement of cerebral sulci and cerebellar folia. No suspicious parenchymal signal, masses or mass effect. Patchy supratentorial white matter FLAIR T2 hyperintensities. No abnormal extra-axial fluid collections. LEFT inferior cerebellar developmental venous anomaly, benign finding. VASCULAR: Normal major intracranial vascular flow voids present at skull base. SKULL AND UPPER CERVICAL SPINE: No abnormal sellar expansion. No suspicious calvarial bone marrow signal. Craniocervical junction  maintained. SINUSES/ORBITS: The mastoid air-cells and included paranasal sinuses are well-aerated. The included ocular globes and orbital contents are non-suspicious. OTHER: None. IMPRESSION: No acute intracranial process. Moderate to severe parenchymal brain volume loss and mild chronic small vessel ischemic disease. Electronically Signed   By: Awilda Metro M.D.   On: 02/26/2017 18:34   Ct Abdomen Pelvis W Contrast  Result Date: 02/20/2017 CLINICAL DATA:  Initial evaluation for acute abdominal pain, fever, weakness, nausea. EXAM: CT ABDOMEN AND PELVIS WITH CONTRAST TECHNIQUE: Multidetector CT imaging of the abdomen and pelvis was performed using the standard protocol following bolus administration of intravenous contrast. CONTRAST:  ISOVUE-300 IOPAMIDOL (ISOVUE-300) INJECTION 61% COMPARISON:  Prior CT from 06/04/2016. FINDINGS: Lower chest: Mild bibasilar atelectatic changes. Visualized lung bases are otherwise clear. Hepatobiliary: Diffuse hypoattenuation liver consistent with steatosis. Liver otherwise unremarkable. Gallbladder somewhat prominent. Minimal hazy pericholecystic hazy stranding/edema. No internal cholelithiasis. No biliary dilatation. Pancreas: Pancreas within normal limits. Spleen: Spleen within normal limits. Adrenals/Urinary Tract: Adrenal glands are normal. Kidneys equal in size with symmetric enhancement. No nephrolithiasis, hydronephrosis, or focal enhancing renal mass. No hydroureter. Bladder fairly distended without acute abnormality. Stomach/Bowel: Stomach within normal limits. No evidence for bowel obstruction appendix within normal limits. Sequelae of prior partial colectomy with surgical anastomosis within the sigmoid colon noted. No acute inflammatory changes seen about the bowels. Vascular/Lymphatic: Mild aorto bi-iliac atherosclerotic disease. No aneurysm. For opacification of the IMA proximally (series 3, image 47). Remaining branch vessels are well opacified and within  normal limits. No pathologically enlarged intra-abdominal or pelvic lymph nodes. Reproductive: Prostate mildly enlarged measuring 5.2 cm in transverse diameter, similar to previous. Other: No free air or fluid. Musculoskeletal: No acute osseous abnormality. No worrisome lytic or blastic osseous lesions. Scattered multilevel degenerative spondylolysis and facet arthrosis noted within the visualized spine. IMPRESSION: 1. Mildly prominent gallbladder with trace pericholecystic stranding/edema. Correlation with laboratory values for possible acute gallbladder pathology recommended. Additionally, further evaluation with dedicated right upper quadrant ultrasound could be performed for further evaluation as warranted. 2. Poor opacification of the proximal IMA. Findings of uncertain significance, and may be simply related to technique on this non angiographic examination. Correlation with physical exam and serum lactate recommended for potential signs of intestinal ischemia. No other secondary signs to suggest acute bowel ischemia identified on this exam. 3. Hepatic steatosis. 4. Mildly enlarged prostate. Prominent urinary bladder distension may reflect a degree of underlying outlet obstruction. Electronically Signed   By: Rise Mu M.D.   On: 02/20/2017 19:16   Ct T-spine No Charge  Result Date: 02/27/2017 CLINICAL DATA:  Fall yesterday.  Upper back pain. EXAM: CT THORACIC SPINE WITHOUT CONTRAST TECHNIQUE: Multidetector CT images of the thoracic were obtained using the standard protocol without intravenous contrast. COMPARISON:  None. FINDINGS: Alignment: Normal Vertebrae: No fracture Paraspinal and other soft tissues: No epidural or paraspinal soft tissue swelling/hematoma. Disc levels: The disc space narrowing and spurring in the mid and  lower thoracic spine. IMPRESSION: No evidence of thoracic fracture.  Mild spondylosis. Electronically Signed   By: Charlett Nose M.D.   On: 02/27/2017 10:43   Dg Chest  Port 1 View  Result Date: 02/27/2017 CLINICAL DATA:  Followup for pulmonary edema/pleural effusion. Patient with cough and dyspnea. Patient fell yesterday. EXAM: PORTABLE CHEST 1 VIEW COMPARISON:  02/26/2017 FINDINGS: Basilar lung opacity, right greater left, has increased since the prior exam, consistent with atelectasis. Remainder of the lungs is clear. The pleural effusions evident on the previous day's CT is not resolved radiographically. No pneumothorax. Cardiac silhouette is normal in size. No mediastinal or hilar masses. IMPRESSION: 1. Mild increase in lung base opacity since the previous day's studies consistent with atelectasis. No convincing pneumonia or pulmonary edema. No pneumothorax. Electronically Signed   By: Amie Portland M.D.   On: 02/27/2017 07:46   Dg Chest Port 1 View  Result Date: 02/20/2017 CLINICAL DATA:  Weakness, malaise and falls for 1 week. EXAM: PORTABLE CHEST 1 VIEW COMPARISON:  09/13/2016 and prior radiographs FINDINGS: The cardiomediastinal silhouette is unremarkable. There is no evidence of focal airspace disease, pulmonary edema, suspicious pulmonary nodule/mass, pleural effusion, or pneumothorax. No acute bony abnormalities are identified. IMPRESSION: No active disease. Electronically Signed   By: Harmon Pier M.D.   On: 02/20/2017 16:56   Ct No Charge  Result Date: 02/27/2017 CLINICAL DATA:  Syncope and fall 02/26/2017. Right shoulder pain. Initial encounter. EXAM: CT OF THE UPPER RIGHT EXTREMITY WITHOUT CONTRAST TECHNIQUE: Multidetector CT imaging of the upper right extremity was performed according to the standard protocol. COMPARISON:  None. FINDINGS: Bones/Joint/Cartilage Provided images are reformatted from chest CT and cervical spine CT performed yesterday. No fracture is identified. The acromioclavicular joint is abnormally widened at 1.1 cm and the distal clavicle appears elevated relative to the acromion. The humeral head is located. Ligaments Suboptimally assessed  by CT. Muscles and Tendons Appear intact. Soft tissues Soft tissue swelling and contusion are seen about the [facility] joint. IMPRESSION: Negative for fracture. Abnormal appearance of the acromioclavicular joint is consistent with separation, at least grade 2. Mild elevation of the distal clavicle relative to the acromion is worrisome for grade 3 AC separation and coracoclavicular ligament tear. Ligaments cannot be adequately assessed with CT scan. Electronically Signed   By: Drusilla Kanner M.D.   On: 02/27/2017 12:16    Time Spent in minutes  30   Susa Raring M.D on 02/28/2017 at 9:06 AM  Between 7am to 7pm - Pager - [phone] ( page via [email or web address], text pages only, please mention full 10 digit call back number). After 7pm go to [email or web address] - password [email or web address]

## 2017-02-28 NOTE — Clinical Social Work Placement (Signed)
   CLINICAL SOCIAL WORK PLACEMENT  NOTE  Date:  02/28/2017  Patient Details  Name: Alvin Benton MRN: [id number] Date of Birth: 03-29-1951  Clinical Social Work is seeking post-discharge placement for this patient at the Skilled  Nursing Facility level of care (*CSW will initial, date and re-position this form in  chart as items are completed):  Yes   Patient/family provided with [facility] Clinical Social Work Department's list of facilities offering this level of care within the geographic area requested by the patient (or if unable, by the patient's family).  Yes   Patient/family informed of their freedom to choose among providers that offer the needed level of care, that participate in Medicare, Medicaid or managed care program needed by the patient, have an available bed and are willing to accept the patient.  Yes   Patient/family informed of [facility]'s ownership interest in [facility] and [facility], as well as of the fact that they are under no obligation to receive care at these facilities.  PASRR submitted to EDS on 02/28/17     PASRR number received on       Existing PASRR number confirmed on       FL2 transmitted to all facilities in geographic area requested by pt/family on 02/28/17     FL2 transmitted to all facilities within larger geographic area on       Patient informed that his/her managed care company has contracts with or will negotiate with certain facilities, including the following:            Patient/family informed of bed offers received.  Patient chooses bed at       Physician recommends and patient chooses bed at      Patient to be transferred to   on  .  Patient to be transferred to facility by       Patient family notified on   of transfer.  Name of family member notified:        PHYSICIAN Please sign FL2     Additional Comment:    _______________________________________________ [name], LCSW 02/28/2017, 2:34 PM

## 2017-02-28 NOTE — Clinical Social Work Note (Signed)
Patient has a good understanding of how unsteady he is and is agreeable to SNF. CSW faxed referral to facilities that take his insurance.  Charlynn CourtSarah Sheylin Scharnhorst, CSW [phone]

## 2017-02-28 NOTE — Consult Note (Signed)
Reason for Consult:Right shoulder injury Referring Physician: Ronnie Derby  Alvin Benton is an 66 y.o. male.  HPI: Alvin Benton fell down a flight of stairs yesterday. He doesn't quite remember how he fell or landed. He complains of right posterior shoulder and back pain. This is exacerbated by any type of movement. He denies any prior hx/o shoulder problems. He describes himself as RHD though writes with his left hand.   Past Medical History:  Diagnosis Date  . Blood in stool   . Diverticulitis   . ETOH abuse   . GERD (gastroesophageal reflux disease)   . Heart murmur   . History of urinary tract infection   . Hypertension   . IBS (irritable bowel syndrome)   . Migraine    "none in a long time" (03/14/2016)  . Nocturia   . Pneumonia    childhood  . Seasonal allergies   . Tobacco abuse     Past Surgical History:  Procedure Laterality Date  . COLONOSCOPY    . CYSTOSCOPY WITH STENT PLACEMENT Left 10/03/2016   Procedure: CYSTOSCOPY WITH  LEFT URETERAL CATHETER PLACEMENT;  Surgeon: Irine Seal, MD;  Location: [facility];  Service: Urology;  Laterality: Left;  . PROCTOSCOPY  10/03/2016   Procedure: PROCTOSCOPY;  Surgeon: Leighton Ruff, MD;  Location: [facility];  Service: General;;  . TONSILLECTOMY      Family History  Problem Relation Age of Onset  . Stroke Mother   . Colon cancer Neg Hx   . Esophageal cancer Neg Hx   . Rectal cancer Neg Hx   . Stomach cancer Neg Hx     Social History:  reports that he has been smoking Cigarettes.  He has a 40.00 pack-year smoking history. He has never used smokeless tobacco. He reports that he drinks about 1.8 oz of alcohol per week . He reports that he does not use drugs.  Allergies: No Known Allergies  Medications: I have reviewed the patient's current medications.  Results for orders placed or performed during the hospital encounter of 02/26/17 (from the past 48 hour(s))  Brain natriuretic peptide     Status: Abnormal   Collection Time: 02/26/17  1:56 PM   Result Value Ref Range   B Natriuretic Peptide 156.2 (H) 0.0 - 100.0 pg/mL  Hemoglobin and hematocrit, blood     Status: Abnormal   Collection Time: 02/26/17  4:48 PM  Result Value Ref Range   Hemoglobin 12.5 (L) 13.0 - 17.0 g/dL   HCT 36.5 (L) 39.0 - 52.0 %  Procalcitonin - Baseline     Status: None   Collection Time: 02/26/17  4:48 PM  Result Value Ref Range   Procalcitonin <0.10 ng/mL    Comment:        Interpretation: PCT (Procalcitonin) <= 0.5 ng/mL: Systemic infection (sepsis) is not likely. Local bacterial infection is possible. (NOTE)         ICU PCT Algorithm               Non ICU PCT Algorithm    ----------------------------     ------------------------------         PCT < 0.25 ng/mL                 PCT < 0.1 ng/mL     Stopping of antibiotics            Stopping of antibiotics       strongly encouraged.  strongly encouraged.    ----------------------------     ------------------------------       PCT level decrease by               PCT < 0.25 ng/mL       >= 80% from peak PCT       OR PCT 0.25 - 0.5 ng/mL          Stopping of antibiotics                                             encouraged.     Stopping of antibiotics           encouraged.    ----------------------------     ------------------------------       PCT level decrease by              PCT >= 0.25 ng/mL       < 80% from peak PCT        AND PCT >= 0.5 ng/mL            Continuin g antibiotics                                              encouraged.       Continuing antibiotics            encouraged.    ----------------------------     ------------------------------     PCT level increase compared          PCT > 0.5 ng/mL         with peak PCT AND          PCT >= 0.5 ng/mL             Escalation of antibiotics                                          strongly encouraged.      Escalation of antibiotics        strongly encouraged.   Hemoglobin and hematocrit, blood     Status: Abnormal    Collection Time: 02/27/17 12:38 AM  Result Value Ref Range   Hemoglobin 10.9 (L) 13.0 - 17.0 g/dL   HCT 32.1 (L) 39.0 - 52.0 %  CK     Status: None   Collection Time: 02/27/17 12:38 AM  Result Value Ref Range   Total CK 292 49 - 397 U/L  CBC     Status: Abnormal   Collection Time: 02/27/17  3:55 AM  Result Value Ref Range   WBC 9.2 4.0 - 10.5 K/uL   RBC 3.29 (L) 4.22 - 5.81 MIL/uL   Hemoglobin 10.4 (L) 13.0 - 17.0 g/dL   HCT 31.1 (L) 39.0 - 52.0 %   MCV 94.5 78.0 - 100.0 fL   MCH 31.6 26.0 - 34.0 pg   MCHC 33.4 30.0 - 36.0 g/dL   RDW 13.4 11.5 - 15.5 %   Platelets 380 150 - 400 K/uL  Comprehensive metabolic panel     Status: Abnormal   Collection Time: 02/27/17  3:55 AM  Result Value Ref Range   Sodium  133 (L) 135 - 145 mmol/L   Potassium 4.0 3.5 - 5.1 mmol/L   Chloride 97 (L) 101 - 111 mmol/L   CO2 26 22 - 32 mmol/L   Glucose, Bld 110 (H) 65 - 99 mg/dL   BUN 11 6 - 20 mg/dL   Creatinine, Ser 0.80 0.61 - 1.24 mg/dL   Calcium 8.5 (L) 8.9 - 10.3 mg/dL   Total Protein 5.1 (L) 6.5 - 8.1 g/dL   Albumin 2.8 (L) 3.5 - 5.0 g/dL   AST 37 15 - 41 U/L   ALT 32 17 - 63 U/L   Alkaline Phosphatase 44 38 - 126 U/L   Total Bilirubin 0.9 0.3 - 1.2 mg/dL   GFR calc non Af Amer >60 >60 mL/min   GFR calc Af Amer >60 >60 mL/min    Comment: (NOTE) The eGFR has been calculated using the CKD EPI equation. This calculation has not been validated in all clinical situations. eGFR's persistently <60 mL/min signify possible Chronic Kidney Disease.    Anion gap 10 5 - 15  Procalcitonin     Status: None   Collection Time: 02/28/17  3:28 AM  Result Value Ref Range   Procalcitonin <0.10 ng/mL    Comment:        Interpretation: PCT (Procalcitonin) <= 0.5 ng/mL: Systemic infection (sepsis) is not likely. Local bacterial infection is possible. (NOTE)         ICU PCT Algorithm               Non ICU PCT Algorithm    ----------------------------     ------------------------------         PCT <  0.25 ng/mL                 PCT < 0.1 ng/mL     Stopping of antibiotics            Stopping of antibiotics       strongly encouraged.               strongly encouraged.    ----------------------------     ------------------------------       PCT level decrease by               PCT < 0.25 ng/mL       >= 80% from peak PCT       OR PCT 0.25 - 0.5 ng/mL          Stopping of antibiotics                                             encouraged.     Stopping of antibiotics           encouraged.    ----------------------------     ------------------------------       PCT level decrease by              PCT >= 0.25 ng/mL       < 80% from peak PCT        AND PCT >= 0.5 ng/mL            Continuin g antibiotics  encouraged.       Continuing antibiotics            encouraged.    ----------------------------     ------------------------------     PCT level increase compared          PCT > 0.5 ng/mL         with peak PCT AND          PCT >= 0.5 ng/mL             Escalation of antibiotics                                          strongly encouraged.      Escalation of antibiotics        strongly encouraged.   CBC     Status: Abnormal   Collection Time: 02/28/17  3:28 AM  Result Value Ref Range   WBC 8.4 4.0 - 10.5 K/uL   RBC 3.03 (L) 4.22 - 5.81 MIL/uL   Hemoglobin 9.7 (L) 13.0 - 17.0 g/dL   HCT 28.8 (L) 39.0 - 52.0 %   MCV 95.0 78.0 - 100.0 fL   MCH 32.0 26.0 - 34.0 pg   MCHC 33.7 30.0 - 36.0 g/dL   RDW 13.5 11.5 - 15.5 %   Platelets 351 150 - 400 K/uL  Basic metabolic panel     Status: Abnormal   Collection Time: 02/28/17  3:28 AM  Result Value Ref Range   Sodium 132 (L) 135 - 145 mmol/L   Potassium 4.3 3.5 - 5.1 mmol/L   Chloride 98 (L) 101 - 111 mmol/L   CO2 25 22 - 32 mmol/L   Glucose, Bld 117 (H) 65 - 99 mg/dL   BUN 7 6 - 20 mg/dL   Creatinine, Ser 0.76 0.61 - 1.24 mg/dL   Calcium 8.8 (L) 8.9 - 10.3 mg/dL   GFR calc non Af Amer >60 >60  mL/min   GFR calc Af Amer >60 >60 mL/min    Comment: (NOTE) The eGFR has been calculated using the CKD EPI equation. This calculation has not been validated in all clinical situations. eGFR's persistently <60 mL/min signify possible Chronic Kidney Disease.    Anion gap 9 5 - 15  Magnesium     Status: None   Collection Time: 02/28/17  3:28 AM  Result Value Ref Range   Magnesium 1.9 1.7 - 2.4 mg/dL  Phosphorus     Status: None   Collection Time: 02/28/17  3:28 AM  Result Value Ref Range   Phosphorus 3.5 2.5 - 4.6 mg/dL    Ct Abdomen Pelvis Wo Contrast  Result Date: 02/26/2017 CLINICAL DATA:  Pt states he fell yesterday and now has pain on right side of abdomen. Pt endorses n/v, denies diarrhea. HTN, IBS, diverticulitis EXAM: CT ABDOMEN AND PELVIS WITHOUT CONTRAST TECHNIQUE: Multidetector CT imaging of the abdomen and pelvis was performed following the standard protocol without IV contrast. COMPARISON:  CT of abdomen and pelvis 02/20/2017 FINDINGS: Lower chest: Interval development of small right pleural effusion. There is bibasilar atelectasis. Heart size is normal. Hepatobiliary: No focal liver abnormality is seen. No gallstones, gallbladder wall thickening, or biliary dilatation. Pancreas: Unremarkable. No pancreatic ductal dilatation or surrounding inflammatory changes. Spleen: Normal in size without focal abnormality. Adrenals/Urinary Tract: Adrenals are normal in appearance. Normal appearance of both kidneys. Contrast is identified in the collecting  systems following prior contrast exam. Urinary bladder is normal in appearance, opacified with contrast following recent contrast exam. Stomach/Bowel: The stomach and small bowel loops are normal in appearance. The appendix is well seen and has a normal appearance. Colon contains moderate stool burden. No evidence for obstruction. Suture line identified in the rectosigmoid colon. Vascular/Lymphatic: There is atherosclerotic calcification of the  abdominal aorta. No retroperitoneal or mesenteric adenopathy. Reproductive: Prostate is enlarged. Other: Within the region of the right flank, there is significant edema and subcutaneous stranding, new since the most recent CT exam. The hematoma measures 5.9 x 8.1 cm. There is no underlying fracture. Musculoskeletal: Mild degenerative changes are seen in thoracic and lumbar spine. No acute fracture or subluxation.1 IMPRESSION: 1. Large right flank hematoma measuring 5.9 x 8.1 cm and associated significant flank edema. 2. Small right pleural effusion. 3. No evidence for acute fracture. 4. Contrast within the collecting systems and urinary bladder following recent intravenous contrast administration. 5.  Aortic atherosclerosis. 6. Prostatic enlargement. Electronically Signed   By: Nolon Nations M.D.   On: 02/26/2017 19:09   Dg Shoulder Right  Result Date: 02/27/2017 CLINICAL DATA:  Sharp pain.  Fall. EXAM: RIGHT SHOULDER - 2+ VIEW COMPARISON:  Chest x-ray 02/27/2017.  CT chest 02/26/2017. FINDINGS: Acromioclavicular separation cannot be excluded. No evidence of fracture or dislocation. Atelectatic changes right lung base again identified, follow-up its chest x-rays demonstrate clearing suggested. IMPRESSION: 1.  Right acromioclavicular separation cannot be excluded . 2. Right base atelectatic changes again noted. Follow-up chest x-rays to demonstrate clearing suggested. Electronically Signed   By: Marcello Moores  Register   On: 02/27/2017 09:07   Ct Angio Chest Pe W/cm &/or Wo Cm  Result Date: 02/26/2017 CLINICAL DATA:  Mid Chest pain; syncope with fall this morning EXAM: CT ANGIOGRAPHY CHEST WITH CONTRAST TECHNIQUE: Multidetector CT imaging of the chest was performed using the standard protocol during bolus administration of intravenous contrast. Multiplanar CT image reconstructions and MIPs were obtained to evaluate the vascular anatomy. CONTRAST:  100 cc Isovue 370 COMPARISON:  Chest x-ray 02/26/2017 FINDINGS:  Cardiovascular: Heart size is normal. No imaged pericardial effusion or significant coronary artery calcifications. Pulmonary arteries are well opacified and there is no acute pulmonary embolus. There is minimal atherosclerotic calcification of the thoracic aorta. Mediastinum/Nodes: Normal appearance of the esophagus. No significant mediastinal, hilar, or axillary adenopathy. The visualized portion of the thyroid gland has a normal appearance. Lungs/Pleura: Small right pleural effusion. There is smooth septal thickening primarily involving the dependent aspects of the lungs. No suspicious pulmonary nodules. No frank consolidations. Upper Abdomen: Gallbladder is present. Musculoskeletal: Mild mid thoracic spondylosis. Review of the MIP images confirms the above findings. IMPRESSION: 1. Technically adequate exam showing no acute pulmonary embolus. 2. Mild changes of pulmonary edema with small right pleural effusion. 3. Mild thoracic atherosclerosis. 4. Mid thoracic spondylosis. Electronically Signed   By: Nolon Nations M.D.   On: 02/26/2017 13:03   Mr Brain Wo Contrast  Result Date: 02/26/2017 CLINICAL DATA:  Syncopal episode at home. LEFT chest pain. History of migraines, chronic alcohol abuse, hypertension. EXAM: MRI HEAD WITHOUT CONTRAST TECHNIQUE: Multiplanar, multiecho pulse sequences of the brain and surrounding structures were obtained without intravenous contrast. COMPARISON:  CT HEAD Feb 26, 2017 at 0906 hours FINDINGS: Severely limited FLAIR sequence due to artifact. BRAIN: No reduced diffusion to suggest acute ischemia. No susceptibility artifact to suggest hemorrhage. Moderate to severe ventriculomegaly on the basis of parenchymal brain volume loss as there is overall commensurate enlargement  of cerebral sulci and cerebellar folia. No suspicious parenchymal signal, masses or mass effect. Patchy supratentorial white matter FLAIR T2 hyperintensities. No abnormal extra-axial fluid collections. LEFT  inferior cerebellar developmental venous anomaly, benign finding. VASCULAR: Normal major intracranial vascular flow voids present at skull base. SKULL AND UPPER CERVICAL SPINE: No abnormal sellar expansion. No suspicious calvarial bone marrow signal. Craniocervical junction maintained. SINUSES/ORBITS: The mastoid air-cells and included paranasal sinuses are well-aerated. The included ocular globes and orbital contents are non-suspicious. OTHER: None. IMPRESSION: No acute intracranial process. Moderate to severe parenchymal brain volume loss and mild chronic small vessel ischemic disease. Electronically Signed   By: Elon Alas M.D.   On: 02/26/2017 18:34   Ct T-spine No Charge  Result Date: 02/27/2017 CLINICAL DATA:  Fall yesterday.  Upper back pain. EXAM: CT THORACIC SPINE WITHOUT CONTRAST TECHNIQUE: Multidetector CT images of the thoracic were obtained using the standard protocol without intravenous contrast. COMPARISON:  None. FINDINGS: Alignment: Normal Vertebrae: No fracture Paraspinal and other soft tissues: No epidural or paraspinal soft tissue swelling/hematoma. Disc levels: The disc space narrowing and spurring in the mid and lower thoracic spine. IMPRESSION: No evidence of thoracic fracture.  Mild spondylosis. Electronically Signed   By: Rolm Baptise M.D.   On: 02/27/2017 10:43   Dg Chest Port 1 View  Result Date: 02/27/2017 CLINICAL DATA:  Followup for pulmonary edema/pleural effusion. Patient with cough and dyspnea. Patient fell yesterday. EXAM: PORTABLE CHEST 1 VIEW COMPARISON:  02/26/2017 FINDINGS: Basilar lung opacity, right greater left, has increased since the prior exam, consistent with atelectasis. Remainder of the lungs is clear. The pleural effusions evident on the previous day's CT is not resolved radiographically. No pneumothorax. Cardiac silhouette is normal in size. No mediastinal or hilar masses. IMPRESSION: 1. Mild increase in lung base opacity since the previous day's studies  consistent with atelectasis. No convincing pneumonia or pulmonary edema. No pneumothorax. Electronically Signed   By: Lajean Manes M.D.   On: 02/27/2017 07:46   Ct No Charge  Result Date: 02/27/2017 CLINICAL DATA:  Syncope and fall 02/26/2017. Right shoulder pain. Initial encounter. EXAM: CT OF THE UPPER RIGHT EXTREMITY WITHOUT CONTRAST TECHNIQUE: Multidetector CT imaging of the upper right extremity was performed according to the standard protocol. COMPARISON:  None. FINDINGS: Bones/Joint/Cartilage Provided images are reformatted from chest CT and cervical spine CT performed yesterday. No fracture is identified. The acromioclavicular joint is abnormally widened at 1.1 cm and the distal clavicle appears elevated relative to the acromion. The humeral head is located. Ligaments Suboptimally assessed by CT. Muscles and Tendons Appear intact. Soft tissues Soft tissue swelling and contusion are seen about the [place] joint. IMPRESSION: Negative for fracture. Abnormal appearance of the acromioclavicular joint is consistent with separation, at least grade 2. Mild elevation of the distal clavicle relative to the acromion is worrisome for grade 3 AC separation and coracoclavicular ligament tear. Ligaments cannot be adequately assessed with CT scan. Electronically Signed   By: Inge Rise M.D.   On: 02/27/2017 12:16    Review of Systems  Constitutional: Negative for weight loss.  HENT: Negative for ear discharge, ear pain, hearing loss and tinnitus.   Eyes: Negative for blurred vision, double vision, photophobia and pain.  Respiratory: Negative for cough, sputum production and shortness of breath.   Cardiovascular: Positive for chest pain.  Gastrointestinal: Negative for abdominal pain, nausea and vomiting.  Genitourinary: Negative for dysuria, flank pain, frequency and urgency.  Musculoskeletal: Positive for back pain and joint pain (Right shoulder).  Negative for falls, myalgias and neck pain.  Neurological:  Positive for tingling. Negative for dizziness, sensory change, focal weakness, loss of consciousness and headaches.  Endo/Heme/Allergies: Does not bruise/bleed easily.  Psychiatric/Behavioral: Positive for substance abuse. Negative for depression and memory loss. The patient is not nervous/anxious.    Blood pressure (!) 157/77, pulse 90, temperature 98.4 F (36.9 C), temperature source Oral, resp. rate 20, height _0  (1.753 m), weight 75.3 kg (166 lb), SpO2 98 %. Physical Exam  Constitutional: He appears well-developed and well-nourished. No distress.  HENT:  Head: Normocephalic.  Eyes: Conjunctivae are normal. Right eye exhibits no discharge. Left eye exhibits no discharge. No scleral icterus.  Cardiovascular: Normal rate and regular rhythm.   Respiratory: Effort normal. No respiratory distress.  Musculoskeletal:  Right shoulder TTP, diffuse but worst at [facility] joint, large ecchymosis posteriorly/superiorly. AROM limited, esp lateral extension. Elbow, wrist, digits- no skin wounds, nontender, no instability, no blocks to motion  Sens  Ax/R/M/U intact  Mot   Ax/ R/ PIN/ M/ AIN/ U intact, tremor noted  Rad 2+  Left shoulder, elbow, wrist, digits- no skin wounds, nontender, no instability, no blocks to motion  Sens  Ax/R/M/U intact  Mot   Ax/ R/ PIN/ M/ AIN/ U intact, tremor noted  Rad 2+  Lymphadenopathy:    He has no cervical adenopathy.  Neurological: He is alert.  Skin: Skin is warm and dry. He is not diaphoretic.  Psychiatric: He has a normal mood and affect. His behavior is normal.    Assessment/Plan: Right shoulder pain -- X-ray unremarkable but AC separation likely. Reread on CT remarked on some elevation of clavicle compared with contralateral side. Some of the diffuse pain likely 2/2 adjacent rib fractures. Given extensive bruising and pain OOP I suspect additional pathology. Will try to get MRI of shoulder. Sling for now. Dr. Stann Mainland to f/u as outpatient.  Addendum MR shows  grade III AC separation, clavicular displacement is less than 2cm, should heal well without surgery. Continue sling, NWB, ice, and f/u with Dr. Stann Mainland in office in 1-2 weeks.    Lisette Abu, PA-C Orthopedic Surgery [phone] 02/28/2017, 9:32 AM

## 2017-02-28 NOTE — [place] FL2 (Signed)
[place] MEDICAID FL2 LEVEL OF CARE SCREENING TOOL     IDENTIFICATION  Patient Name: Alvin Benton Birthdate: 06-Aug-1951 Sex: male Admission Date (Current Location): 02/26/2017  [facility] [place]   Facility and Address:  [facility] [place], [address], [place], [place] [address]      Provider Number: [id number]  Attending Physician Name and Address:  Leroy SeaPrashant K Singh, MD  Relative Name and Phone Number:       Current Level of Care: Hospital Recommended Level of Care: Skilled Nursing Facility Prior Approval Number:    Date Approved/Denied:   PASRR Number: Manual review  Discharge Plan: SNF    Current Diagnoses: Patient Active Problem List   Diagnosis Date Noted  . Syncope 02/26/2017  . History of atrial fibrillation 02/26/2017  . Right flank hematoma 02/26/2017  . Zygomatic fracture, right side, initial encounter for closed fracture (HCC) 02/26/2017  . Multiple rib fractures 02/26/2017  . Acute pulmonary edema (HCC)   . Dizziness   . Fall   . Dehydration with hyponatremia 02/20/2017  . Alcohol withdrawal (HCC) 06/04/2016  . Volume depletion 06/04/2016  . Hypokalemia 06/04/2016  . Diverticulitis of intestine with perforation without bleeding 06/04/2016  . Essential hypertension   . Hypomagnesemia   . Ileus (HCC) 05/05/2016  . Systolic murmur 05/05/2016  . Anxiety state 04/19/2016  . 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 Hypoxia 04/05/2016  . Atrial fibrillation (HCC) 04/05/2016  . Diverticulitis of large intestine with perforation and abscess 03/14/2016  . Chronic constipation 03/08/2016  . Alcohol abuse 02/15/2016  . Tobacco use disorder 07/17/2015  . Allergic rhinitis 07/17/2015  . Reactive airway disease with wheezing 07/17/2015  . Overweight (BMI 25.0-29.9) 07/17/2015  . Hand pain, left 01/12/2014  . LLQ pain-chronic, intermittent and episodic 09/17/2013    Orientation RESPIRATION BLADDER Height & Weight      Self, Time, Situation, Place  Normal Continent Weight: 166 lb (75.3 kg) Height:  5\' 9"  (175.3 cm)  BEHAVIORAL SYMPTOMS/MOOD NEUROLOGICAL BOWEL NUTRITION STATUS   (Anxious, Irritable, Restless)  (None) Continent Diet (Regular)  AMBULATORY STATUS COMMUNICATION OF NEEDS Skin   Extensive Assist Verbally Skin abrasions, Bruising                       Personal Care Assistance Level of Assistance  Bathing, Feeding, Dressing Bathing Assistance: Limited assistance Feeding assistance: Independent Dressing Assistance: Limited assistance     Functional Limitations Info  Sight, Hearing, Speech Sight Info: Adequate Hearing Info: Adequate Speech Info: Adequate    SPECIAL CARE FACTORS FREQUENCY  PT (By licensed PT), OT (By licensed OT), Blood pressure     PT Frequency: 5 x week OT Frequency: 5 x week            Contractures Contractures Info: Not present    Additional Factors Info  Code Status, Allergies, Psychotropic Code Status Info: Full Allergies Info: NKDA Psychotropic Info: Anxiety: Ativan 1 mg PO or IV every 6 hours prn         Current Medications (02/28/2017):  This is the current hospital active medication list Current Facility-Administered Medications  Medication Dose Route Frequency Provider Last Rate Last Dose  . acetaminophen (TYLENOL) tablet 1,000 mg  1,000 mg Oral Q6H PRN Ozella Rocksavid J Merrell, MD   1,000 mg at 02/28/17 1144  . albuterol (PROVENTIL) (2.5 MG/3ML) 0.083% nebulizer solution 3 mL  3 mL Inhalation Q4H PRN Ozella Rocksavid J Merrell, MD      .  aspirin chewable tablet 81 mg  81 mg Oral Daily Leroy Sea, MD   81 mg at 02/28/17 0954  . carvedilol (COREG) tablet 6.25 mg  6.25 mg Oral BID WC Leroy Sea, MD   6.25 mg at 02/28/17 0629  . chlordiazePOXIDE (LIBRIUM) capsule 10 mg  10 mg Oral TID Leroy Sea, MD   10 mg at 02/28/17 0954  . fentaNYL (SUBLIMAZE) injection 50-100 mcg  50-100 mcg Intravenous Q4H PRN Ozella Rocks, MD   100 mcg at 02/28/17 1610   . folic acid (FOLVITE) tablet 1 mg  1 mg Oral Daily Ozella Rocks, MD   1 mg at 02/28/17 0954  . hydrALAZINE (APRESOLINE) injection 5-10 mg  5-10 mg Intravenous Q4H PRN Ozella Rocks, MD      . hydrALAZINE (APRESOLINE) tablet 50 mg  50 mg Oral Q8H Leroy Sea, MD   50 mg at 02/28/17 9604  . ketorolac (TORADOL) 15 MG/ML injection 15 mg  15 mg Intravenous Q8H PRN Ozella Rocks, MD   15 mg at 02/28/17 0953  . LORazepam (ATIVAN) tablet 1 mg  1 mg Oral Q6H PRN Ozella Rocks, MD   1 mg at 02/28/17 5409   Or  . LORazepam (ATIVAN) injection 1 mg  1 mg Intravenous Q6H PRN Ozella Rocks, MD   1 mg at 02/26/17 2325  . multivitamin with minerals tablet 1 tablet  1 tablet Oral Daily Ozella Rocks, MD   1 tablet at 02/28/17 [phone]  . nicotine (NICODERM CQ - dosed in mg/24 hours) patch 14 mg  14 mg Transdermal Daily PRN Ozella Rocks, MD      . ondansetron [facility]) tablet 4 mg  4 mg Oral Q6H PRN Ozella Rocks, MD       Or  . ondansetron [facility]) injection 4 mg  4 mg Intravenous Q6H PRN Ozella Rocks, MD      . oxyCODONE (Oxy IR/ROXICODONE) immediate release tablet 5 mg  5 mg Oral Q4H PRN Ozella Rocks, MD   5 mg at 02/28/17 0954  . thiamine (VITAMIN B-1) tablet 100 mg  100 mg Oral Daily Ozella Rocks, MD   100 mg at 02/28/17 1478   Facility-Administered Medications Ordered in Other Encounters  Medication Dose Route Frequency Provider Last Rate Last Dose  . lactated ringers infusion    Continuous PRN Epimenio Sarin, CRNA   Stopped at 08/29/16 [phone]     Discharge Medications: Please see discharge summary for a list of discharge medications.  Relevant Imaging Results:  Relevant Lab Results:   Additional Information SS#: [id number]  Margarito Liner, LCSW

## 2017-03-01 MED ORDER — CARVEDILOL 12.5 MG PO TABS
12.5000 mg | ORAL_TABLET | Freq: Two times a day (BID) | ORAL | Status: DC
  Administered 2017-03-01 – 2017-03-03 (×4): 12.5 mg via ORAL
  Filled 2017-03-01 (×4): qty 1

## 2017-03-01 NOTE — Progress Notes (Signed)
PROGRESS NOTE                                                                                                                                                                                                             Patient Demographics:    Alvin Benton, is a 66 y.o. male, DOB - 1951/06/23, WUJ:[id number]  Admit date - 02/26/2017   Admitting Physician Ozella Rocks, MD  Outpatient Primary MD for the patient is Marilynn Rail, MD  LOS - 3  Chief Complaint  Patient presents with  . Fall  . Chest Pain       Brief Narrative  Alvin Benton is a 66 y.o. male with medical history significant of EtOH abuse, GERD, hypertension, migraines, IBS, tobacco abuse. Patient presenting to [facility] emergency room after syncopal episode, apparently he was leaving for work and fell through a flight of stairs, he does not recall passing out, he did say that he had some dizziness prior to the fall with sensation of some spinning, note his alcohol level was extremely high upon admission and he now except that he drank before the fall. He says he drinks on a daily basis and he drinks a lot.   Subjective:   Patient in bed, appears comfortable, denies any headache, no fever, no chest pain or pressure, no shortness of breath , no abdominal pain. No focal weakness. +ve R shoulder pain.   Assessment  & Plan :     1. Syncope and fall. Most likely mechanical fall due to alcohol intoxication. CT head, CT C-spine, MRI brain all nonacute. He has no focal deficits, no headache at this time. Not orthostatic, had stable echocardiogram, Was evaluated by PT and will qualify for SNF due to poor balance.  2.  Alcohol abuse. Currently no signs of DTs but remains at risk for the same, continue Librium and CIWA protocol, counseled to quit alcohol, already seen by social work.  3. Right shoulder grade 3 AC separation and coracoclavicular ligament tear. Right  flank hematoma. Due to the fall at home, continue supportive care and pain control, seen by orthopedics, right shoulder sling will be provided, will qualify for SNF likely discharge tomorrow  4. Hypertension. Blood pressure her Coreg dose increased, continue hydralazine scheduled and as needed  5. Paroxysmal atrial fibrillation. [place] vasc 2 score of at  least 2. History of alcohol binging and multiple falls, poor candidate for anticoagulation, placed on Coreg and aspirin.  6. Fractures: non-displaced R Zygomatic fracture and R 2nd and 3rd proximal comminuted fractures noted. Correlates w/ pts complaints of back/chest pain, supportive care.  7. Atelectasis with small left pleural effusion. Add an incentive spirometry. Clinically no CHF.    Diet : Diet regular Room service appropriate? Yes; Fluid consistency: Thin    Family Communication  :  None  Code Status :  Full  Disposition Plan  :  SNF in 1-2 days  Consults  :    Procedures  :    CT head, CT C-spine, MRI brain. All nonacute.  MRI brain - non acute  CT right shoulder - AC seperation  DVT Prophylaxis  :   SCDs    Lab Results  Component Value Date   PLT 351 02/28/2017    Inpatient Medications  Scheduled Meds: . aspirin  81 mg Oral Daily  . carvedilol  6.25 mg Oral BID WC  . chlordiazePOXIDE  10 mg Oral TID  . folic acid  1 mg Oral Daily  . hydrALAZINE  50 mg Oral Q8H  . multivitamin with minerals  1 tablet Oral Daily  . thiamine  100 mg Oral Daily   Continuous Infusions:  PRN Meds:.acetaminophen, albuterol, fentaNYL (SUBLIMAZE) injection, hydrALAZINE, ketorolac, LORazepam **OR** LORazepam, nicotine, ondansetron **OR** ondansetron (ZOFRAN) IV, oxyCODONE  Antibiotics  :    Anti-infectives    None         Objective:   Vitals:   02/28/17 1817 02/28/17 2050 03/01/17 0020 03/01/17 0520  BP: 134/63 (!) 148/63 (!) 153/62 (!) 161/87  Pulse: 72 77 79 85  Resp:  18 20 20   Temp:  98.3 F (36.8 C) 98.2 F (36.8  C) 98.7 F (37.1 C)  TempSrc:  Oral Oral Oral  SpO2:  97% 96% 95%  Weight:    73.9 kg (163 lb)  Height:        Wt Readings from Last 3 Encounters:  03/01/17 73.9 kg (163 lb)  02/20/17 76.8 kg (169 lb 5 oz)  10/03/16 71.2 kg (157 lb)     Intake/Output Summary (Last 24 hours) at 03/01/17 1006 Last data filed at 03/01/17 0657  Gross per 24 hour  Intake              720 ml  Output             1525 ml  Net             -805 ml     Physical Exam  Awake Alert, Oriented X 3, No new F.N deficits,  Anxious affect, +ve mild tremors, Supple Neck,No JVD, No cervical lymphadenopathy appriciated.  Symmetrical Chest wall movement, Good air movement bilaterally, CTAB RRR,No Gallops,Rubs or new Murmurs, No Parasternal Heave +ve B.Sounds, Abd Soft, No tenderness, No organomegaly appriciated, No rebound - guarding or rigidity. No Cyanosis, Clubbing or edema,  multiple old bruises, Painful range of motion in the right shoulder    Data Review:    CBC  Recent Labs Lab 02/26/17 0835 02/26/17 1648 02/27/17 0038 02/27/17 0355 02/28/17 0328  WBC 10.1  --   --  9.2 8.4  HGB 12.0* 12.5* 10.9* 10.4* 9.7*  HCT 34.6* 36.5* 32.1* 31.1* 28.8*  PLT 380  --   --  380 351  MCV 93.5  --   --  94.5 95.0  MCH 32.4  --   --  31.6 32.0  MCHC 34.7  --   --  33.4 33.7  RDW 13.1  --   --  13.4 13.5  LYMPHSABS 1.3  --   --   --   --   MONOABS 0.8  --   --   --   --   EOSABS 0.1  --   --   --   --   BASOSABS 0.1  --   --   --   --     Chemistries   Recent Labs Lab 02/22/17 1417 02/22/17 2006 02/26/17 0835 02/27/17 0355 02/28/17 0328  NA 125* 124* 137 133* 132*  K 3.2* 3.3* 3.7 4.0 4.3  CL 91* 89* 103 97* 98*  CO2 26 27 26 26 25   GLUCOSE 132* 106* 101* 110* 117*  BUN 10 9 6 11 7   CREATININE 0.84 0.83 0.81 0.80 0.76  CALCIUM 8.2* 8.4* 9.1 8.5* 8.8*  MG  --   --   --   --  1.9  AST  --   --  52* 37  --   ALT  --   --  44 32  --   ALKPHOS  --   --  41 44  --   BILITOT  --   --  0.2* 0.9   --    ------------------------------------------------------------------------------------------------------------------ No results for input(s): CHOL, HDL, LDLCALC, TRIG, CHOLHDL, LDLDIRECT in the last 72 hours.  Lab Results  Component Value Date   HGBA1C 4.4 (L) 08/27/2016   ------------------------------------------------------------------------------------------------------------------ No results for input(s): TSH, T4TOTAL, T3FREE, THYROIDAB in the last 72 hours.  Invalid input(s): FREET3 ------------------------------------------------------------------------------------------------------------------ No results for input(s): VITAMINB12, FOLATE, FERRITIN, TIBC, IRON, RETICCTPCT in the last 72 hours.  Coagulation profile No results for input(s): INR, PROTIME in the last 168 hours.  No results for input(s): DDIMER in the last 72 hours.  Cardiac Enzymes  Recent Labs Lab 02/26/17 0835  TROPONINI <0.03   ------------------------------------------------------------------------------------------------------------------    Component Value Date/Time   BNP 156.2 (H) 02/26/2017 1356    Micro Results Recent Results (from the past 240 hour(s))  Blood Culture (routine x 2)     Status: None   Collection Time: 02/20/17  4:30 PM  Result Value Ref Range Status   Specimen Description BLOOD RIGHT FOREARM  Final   Special Requests   Final    BOTTLES DRAWN AEROBIC AND ANAEROBIC Blood Culture adequate volume   Culture NO GROWTH 5 DAYS  Final   Report Status 02/25/2017 FINAL  Final  Blood Culture (routine x 2)     Status: None   Collection Time: 02/20/17  6:30 PM  Result Value Ref Range Status   Specimen Description BLOOD LEFT FOREARM  Final   Special Requests   Final    BOTTLES DRAWN AEROBIC AND ANAEROBIC Blood Culture adequate volume   Culture NO GROWTH 5 DAYS  Final   Report Status 02/25/2017 FINAL  Final    Radiology Reports Ct Abdomen Pelvis Wo Contrast  Result Date:  02/26/2017 CLINICAL DATA:  Pt states he fell yesterday and now has pain on right side of abdomen. Pt endorses n/v, denies diarrhea. HTN, IBS, diverticulitis EXAM: CT ABDOMEN AND PELVIS WITHOUT CONTRAST TECHNIQUE: Multidetector CT imaging of the abdomen and pelvis was performed following the standard protocol without IV contrast. COMPARISON:  CT of abdomen and pelvis 02/20/2017 FINDINGS: Lower chest: Interval development of small right pleural effusion. There is bibasilar atelectasis. Heart size is normal. Hepatobiliary: No focal liver abnormality is seen. No  gallstones, gallbladder wall thickening, or biliary dilatation. Pancreas: Unremarkable. No pancreatic ductal dilatation or surrounding inflammatory changes. Spleen: Normal in size without focal abnormality. Adrenals/Urinary Tract: Adrenals are normal in appearance. Normal appearance of both kidneys. Contrast is identified in the collecting systems following prior contrast exam. Urinary bladder is normal in appearance, opacified with contrast following recent contrast exam. Stomach/Bowel: The stomach and small bowel loops are normal in appearance. The appendix is well seen and has a normal appearance. Colon contains moderate stool burden. No evidence for obstruction. Suture line identified in the rectosigmoid colon. Vascular/Lymphatic: There is atherosclerotic calcification of the abdominal aorta. No retroperitoneal or mesenteric adenopathy. Reproductive: Prostate is enlarged. Other: Within the region of the right flank, there is significant edema and subcutaneous stranding, new since the most recent CT exam. The hematoma measures 5.9 x 8.1 cm. There is no underlying fracture. Musculoskeletal: Mild degenerative changes are seen in thoracic and lumbar spine. No acute fracture or subluxation.1 IMPRESSION: 1. Large right flank hematoma measuring 5.9 x 8.1 cm and associated significant flank edema. 2. Small right pleural effusion. 3. No evidence for acute fracture. 4.  Contrast within the collecting systems and urinary bladder following recent intravenous contrast administration. 5.  Aortic atherosclerosis. 6. Prostatic enlargement. Electronically Signed   By: Norva Pavlov M.D.   On: 02/26/2017 19:09   Dg Chest 2 View  Result Date: 02/26/2017 CLINICAL DATA:  Syncopal episode with resultant fall down stairs this morning. Current smoker, alcohol abuse. EXAM: CHEST  2 VIEW COMPARISON:  Portable chest x-ray dated February 20, 2017 FINDINGS: The lungs are mildly hypoinflated which is accentuated by the lordotic positioning. There is no evidence of acute infiltrate or pulmonary contusion. There is no pneumothorax or pleural effusion. The heart and pulmonary vascularity and mediastinal structures are normal. The observed bony structures exhibit no definite acute abnormality. There is widening of the right [facility] joint which has appeared since the previous study. IMPRESSION: There is no acute cardiopulmonary abnormality. New widening of the right [facility] joint may be acute. Correlation with any symptoms over the right [facility] joint is needed. Electronically Signed   By: David  [place] M.D.   On: 02/26/2017 09:33   Dg Shoulder Right  Result Date: 02/27/2017 CLINICAL DATA:  Sharp pain.  Fall. EXAM: RIGHT SHOULDER - 2+ VIEW COMPARISON:  Chest x-ray 02/27/2017.  CT chest 02/26/2017. FINDINGS: Acromioclavicular separation cannot be excluded. No evidence of fracture or dislocation. Atelectatic changes right lung base again identified, follow-up its chest x-rays demonstrate clearing suggested. IMPRESSION: 1.  Right acromioclavicular separation cannot be excluded . 2. Right base atelectatic changes again noted. Follow-up chest x-rays to demonstrate clearing suggested. Electronically Signed   By: Maisie Fus  Register   On: 02/27/2017 09:07   Ct Head Wo Contrast  Result Date: 02/26/2017 CLINICAL DATA:  Syncope with fall EXAM: CT HEAD WITHOUT CONTRAST CT CERVICAL SPINE WITHOUT CONTRAST TECHNIQUE:  Multidetector CT imaging of the head and cervical spine was performed following the standard protocol without intravenous contrast. Multiplanar CT image reconstructions of the cervical spine were also generated. COMPARISON:  Head CT February 20, 2017 FINDINGS: CT HEAD FINDINGS Brain: Mild diffuse atrophy is stable. There is no intracranial mass, hemorrhage, extra-axial fluid collection, or midline shift. There is mild patchy small vessel disease in the centra semiovale bilaterally. There is no new gray-white compartment lesion. No acute infarct evident. Vascular: No demonstrable hyperdense vessel. There is slight calcification in each cavernous carotid artery. Next Skull: Bony calvarium appears intact. There is, however,  an acute appearing nondisplaced fracture of the anterior right zygomatic arch. Sinuses/Orbits: There is mild mucosal thickening in several ethmoid air cells bilaterally. There is mucosal thickening in the inferior right maxillary antrum. Other paranasal sinuses which are visualized are clear. Orbits appear symmetric bilaterally. Other: Mastoid air cells are clear. CT CERVICAL SPINE FINDINGS Alignment: There is no spondylolisthesis. Skull base and vertebrae: Skullbase and craniocervical junction regions appear normal. There is no fracture in the cervical region. No blastic or lytic bone lesions. Fracture of the right zygomatic arch described in the head CT report is noted. There are also comminuted fractures of the posterior right second rib. Soft tissues and spinal canal: Prevertebral soft tissues and predental space regions are normal. No paraspinous lesions. There is no cord or canal hematoma evident. Disc levels: There is moderately severe disc space narrowing at C4-5 and C6-7. There is moderate disc space narrowing at C5-6. There is calcification in the anterior ligament at C3-4, C4-5, and C5-6. There are anterior osteophytes at C3, C4, C5, and C6. There is facet hypertrophy with exit foraminal  narrowing at C5-6 and C6-7 bilaterally. Question degrees of facet hypertrophy noted at several levels. No frank disc extrusion. Upper chest: Visualized lung regions are clear. No apical pneumothorax evident. Other: None IMPRESSION: CT head: Nondisplaced fracture anterior right zygomatic arch. No skull fracture evident. There is stable atrophy with mild periventricular small vessel disease. No intracranial mass, hemorrhage, or extra-axial fluid collection. There is mild ethmoid air cell disease bilaterally. There is slight carotid artery calcification distally. CT cervical spine: No cervical spine region fracture or spondylolisthesis. There is multilevel arthropathy. There are comminuted fractures of the posterior right second and third ribs as well as a fracture of the right zygomatic arch. There is multifocal cervical spine arthropathy. Electronically Signed   By: Bretta Bang III M.D.   On: 02/26/2017 09:27   Ct Head Wo Contrast  Result Date: 02/20/2017 CLINICAL DATA:  Multiple falls with head injury EXAM: CT HEAD WITHOUT CONTRAST TECHNIQUE: Contiguous axial images were obtained from the base of the skull through the vertex without intravenous contrast. COMPARISON:  None. FINDINGS: Brain: No definite acute territorial infarction, hemorrhage or focal mass lesion is visualized. Mild periventricular white matter hypodensity suspect secondary to small vessel ischemic changes. Mild cortical atrophy. Slight ventricular prominence since, likely related to the presence of atrophy. Vascular: Slightly dense appearing proximal left MCA, suspect that this is secondary to vessel tortuosity. Scattered calcifications at the carotid siphons. Skull: No fracture or suspicious bone lesion. Sinuses/Orbits: Minimal mucosal thickening in the maxillary and ethmoid sinuses. No acute orbital abnormality. Other: None IMPRESSION: No definite CT evidence for acute intracranial abnormality. Mild periventricular white matter small  vessel ischemic changes. Electronically Signed   By: Jasmine Pang M.D.   On: 02/20/2017 16:30   Ct Angio Chest Pe W/cm &/or Wo Cm  Result Date: 02/26/2017 CLINICAL DATA:  Mid Chest pain; syncope with fall this morning EXAM: CT ANGIOGRAPHY CHEST WITH CONTRAST TECHNIQUE: Multidetector CT imaging of the chest was performed using the standard protocol during bolus administration of intravenous contrast. Multiplanar CT image reconstructions and MIPs were obtained to evaluate the vascular anatomy. CONTRAST:  100 cc Isovue 370 COMPARISON:  Chest x-ray 02/26/2017 FINDINGS: Cardiovascular: Heart size is normal. No imaged pericardial effusion or significant coronary artery calcifications. Pulmonary arteries are well opacified and there is no acute pulmonary embolus. There is minimal atherosclerotic calcification of the thoracic aorta. Mediastinum/Nodes: Normal appearance of the esophagus. No significant mediastinal,  hilar, or axillary adenopathy. The visualized portion of the thyroid gland has a normal appearance. Lungs/Pleura: Small right pleural effusion. There is smooth septal thickening primarily involving the dependent aspects of the lungs. No suspicious pulmonary nodules. No frank consolidations. Upper Abdomen: Gallbladder is present. Musculoskeletal: Mild mid thoracic spondylosis. Review of the MIP images confirms the above findings. IMPRESSION: 1. Technically adequate exam showing no acute pulmonary embolus. 2. Mild changes of pulmonary edema with small right pleural effusion. 3. Mild thoracic atherosclerosis. 4. Mid thoracic spondylosis. Electronically Signed   By: Norva Pavlov M.D.   On: 02/26/2017 13:03   Ct Cervical Spine Wo Contrast  Result Date: 02/26/2017 CLINICAL DATA:  Syncope with fall EXAM: CT HEAD WITHOUT CONTRAST CT CERVICAL SPINE WITHOUT CONTRAST TECHNIQUE: Multidetector CT imaging of the head and cervical spine was performed following the standard protocol without intravenous contrast.  Multiplanar CT image reconstructions of the cervical spine were also generated. COMPARISON:  Head CT February 20, 2017 FINDINGS: CT HEAD FINDINGS Brain: Mild diffuse atrophy is stable. There is no intracranial mass, hemorrhage, extra-axial fluid collection, or midline shift. There is mild patchy small vessel disease in the centra semiovale bilaterally. There is no new gray-white compartment lesion. No acute infarct evident. Vascular: No demonstrable hyperdense vessel. There is slight calcification in each cavernous carotid artery. Next Skull: Bony calvarium appears intact. There is, however, an acute appearing nondisplaced fracture of the anterior right zygomatic arch. Sinuses/Orbits: There is mild mucosal thickening in several ethmoid air cells bilaterally. There is mucosal thickening in the inferior right maxillary antrum. Other paranasal sinuses which are visualized are clear. Orbits appear symmetric bilaterally. Other: Mastoid air cells are clear. CT CERVICAL SPINE FINDINGS Alignment: There is no spondylolisthesis. Skull base and vertebrae: Skullbase and craniocervical junction regions appear normal. There is no fracture in the cervical region. No blastic or lytic bone lesions. Fracture of the right zygomatic arch described in the head CT report is noted. There are also comminuted fractures of the posterior right second rib. Soft tissues and spinal canal: Prevertebral soft tissues and predental space regions are normal. No paraspinous lesions. There is no cord or canal hematoma evident. Disc levels: There is moderately severe disc space narrowing at C4-5 and C6-7. There is moderate disc space narrowing at C5-6. There is calcification in the anterior ligament at C3-4, C4-5, and C5-6. There are anterior osteophytes at C3, C4, C5, and C6. There is facet hypertrophy with exit foraminal narrowing at C5-6 and C6-7 bilaterally. Question degrees of facet hypertrophy noted at several levels. No frank disc extrusion. Upper  chest: Visualized lung regions are clear. No apical pneumothorax evident. Other: None IMPRESSION: CT head: Nondisplaced fracture anterior right zygomatic arch. No skull fracture evident. There is stable atrophy with mild periventricular small vessel disease. No intracranial mass, hemorrhage, or extra-axial fluid collection. There is mild ethmoid air cell disease bilaterally. There is slight carotid artery calcification distally. CT cervical spine: No cervical spine region fracture or spondylolisthesis. There is multilevel arthropathy. There are comminuted fractures of the posterior right second and third ribs as well as a fracture of the right zygomatic arch. There is multifocal cervical spine arthropathy. Electronically Signed   By: Bretta Bang III M.D.   On: 02/26/2017 09:27   Mr Brain Wo Contrast  Result Date: 02/26/2017 CLINICAL DATA:  Syncopal episode at home. LEFT chest pain. History of migraines, chronic alcohol abuse, hypertension. EXAM: MRI HEAD WITHOUT CONTRAST TECHNIQUE: Multiplanar, multiecho pulse sequences of the brain and surrounding structures were  obtained without intravenous contrast. COMPARISON:  CT HEAD Feb 26, 2017 at 0906 hours FINDINGS: Severely limited FLAIR sequence due to artifact. BRAIN: No reduced diffusion to suggest acute ischemia. No susceptibility artifact to suggest hemorrhage. Moderate to severe ventriculomegaly on the basis of parenchymal brain volume loss as there is overall commensurate enlargement of cerebral sulci and cerebellar folia. No suspicious parenchymal signal, masses or mass effect. Patchy supratentorial white matter FLAIR T2 hyperintensities. No abnormal extra-axial fluid collections. LEFT inferior cerebellar developmental venous anomaly, benign finding. VASCULAR: Normal major intracranial vascular flow voids present at skull base. SKULL AND UPPER CERVICAL SPINE: No abnormal sellar expansion. No suspicious calvarial bone marrow signal. Craniocervical junction  maintained. SINUSES/ORBITS: The mastoid air-cells and included paranasal sinuses are well-aerated. The included ocular globes and orbital contents are non-suspicious. OTHER: None. IMPRESSION: No acute intracranial process. Moderate to severe parenchymal brain volume loss and mild chronic small vessel ischemic disease. Electronically Signed   By: Awilda Metro M.D.   On: 02/26/2017 18:34   Ct Abdomen Pelvis W Contrast  Result Date: 02/20/2017 CLINICAL DATA:  Initial evaluation for acute abdominal pain, fever, weakness, nausea. EXAM: CT ABDOMEN AND PELVIS WITH CONTRAST TECHNIQUE: Multidetector CT imaging of the abdomen and pelvis was performed using the standard protocol following bolus administration of intravenous contrast. CONTRAST:  ISOVUE-300 IOPAMIDOL (ISOVUE-300) INJECTION 61% COMPARISON:  Prior CT from 06/04/2016. FINDINGS: Lower chest: Mild bibasilar atelectatic changes. Visualized lung bases are otherwise clear. Hepatobiliary: Diffuse hypoattenuation liver consistent with steatosis. Liver otherwise unremarkable. Gallbladder somewhat prominent. Minimal hazy pericholecystic hazy stranding/edema. No internal cholelithiasis. No biliary dilatation. Pancreas: Pancreas within normal limits. Spleen: Spleen within normal limits. Adrenals/Urinary Tract: Adrenal glands are normal. Kidneys equal in size with symmetric enhancement. No nephrolithiasis, hydronephrosis, or focal enhancing renal mass. No hydroureter. Bladder fairly distended without acute abnormality. Stomach/Bowel: Stomach within normal limits. No evidence for bowel obstruction appendix within normal limits. Sequelae of prior partial colectomy with surgical anastomosis within the sigmoid colon noted. No acute inflammatory changes seen about the bowels. Vascular/Lymphatic: Mild aorto bi-iliac atherosclerotic disease. No aneurysm. For opacification of the IMA proximally (series 3, image 47). Remaining branch vessels are well opacified and within  normal limits. No pathologically enlarged intra-abdominal or pelvic lymph nodes. Reproductive: Prostate mildly enlarged measuring 5.2 cm in transverse diameter, similar to previous. Other: No free air or fluid. Musculoskeletal: No acute osseous abnormality. No worrisome lytic or blastic osseous lesions. Scattered multilevel degenerative spondylolysis and facet arthrosis noted within the visualized spine. IMPRESSION: 1. Mildly prominent gallbladder with trace pericholecystic stranding/edema. Correlation with laboratory values for possible acute gallbladder pathology recommended. Additionally, further evaluation with dedicated right upper quadrant ultrasound could be performed for further evaluation as warranted. 2. Poor opacification of the proximal IMA. Findings of uncertain significance, and may be simply related to technique on this non angiographic examination. Correlation with physical exam and serum lactate recommended for potential signs of intestinal ischemia. No other secondary signs to suggest acute bowel ischemia identified on this exam. 3. Hepatic steatosis. 4. Mildly enlarged prostate. Prominent urinary bladder distension may reflect a degree of underlying outlet obstruction. Electronically Signed   By: Rise Mu M.D.   On: 02/20/2017 19:16   Mr Shoulder Right Wo Contrast  Result Date: 02/28/2017 CLINICAL DATA:  Status post syncope and fall 02/26/2017 with a right shoulder injury and pain. Possible AC joint separation. Initial encounter. EXAM: MRI OF THE RIGHT SHOULDER WITHOUT CONTRAST TECHNIQUE: Multiplanar, multisequence MR imaging of the shoulder was performed. No intravenous  contrast was administered. COMPARISON:  Plain films left shoulder 02/27/2017. CT scan 02/26/2017. FINDINGS: Rotator cuff:  Intact. Muscles:  No atrophy or focal lesion. Biceps long head:  Intact. Acromioclavicular Joint: The AC joint is abnormally widened with edema and hemorrhage within and about it consistent AC  joint separation. The coracoclavicular ligament is completely torn resulting in elevation of the distal clavicle relative to the acromion. Type 2 acromion. No subacromial/subdeltoid bursal fluid. Glenohumeral Joint: Negative. Labrum:  Intact. Bones:  No fracture. Other: None. IMPRESSION: The study is positive for a grade 3 AC joint separation. The rotator cuff and long head of biceps tendons are intact. Electronically Signed   By: Drusilla Kannerhomas  Dalessio M.D.   On: 02/28/2017 13:56   Ct T-spine No Charge  Result Date: 02/27/2017 CLINICAL DATA:  Fall yesterday.  Upper back pain. EXAM: CT THORACIC SPINE WITHOUT CONTRAST TECHNIQUE: Multidetector CT images of the thoracic were obtained using the standard protocol without intravenous contrast. COMPARISON:  None. FINDINGS: Alignment: Normal Vertebrae: No fracture Paraspinal and other soft tissues: No epidural or paraspinal soft tissue swelling/hematoma. Disc levels: The disc space narrowing and spurring in the mid and lower thoracic spine. IMPRESSION: No evidence of thoracic fracture.  Mild spondylosis. Electronically Signed   By: Charlett NoseKevin  Dover M.D.   On: 02/27/2017 10:43   Dg Chest Port 1 View  Result Date: 02/27/2017 CLINICAL DATA:  Followup for pulmonary edema/pleural effusion. Patient with cough and dyspnea. Patient fell yesterday. EXAM: PORTABLE CHEST 1 VIEW COMPARISON:  02/26/2017 FINDINGS: Basilar lung opacity, right greater left, has increased since the prior exam, consistent with atelectasis. Remainder of the lungs is clear. The pleural effusions evident on the previous day's CT is not resolved radiographically. No pneumothorax. Cardiac silhouette is normal in size. No mediastinal or hilar masses. IMPRESSION: 1. Mild increase in lung base opacity since the previous day's studies consistent with atelectasis. No convincing pneumonia or pulmonary edema. No pneumothorax. Electronically Signed   By: Amie Portlandavid  Ormond M.D.   On: 02/27/2017 07:46   Dg Chest Port 1  View  Result Date: 02/20/2017 CLINICAL DATA:  Weakness, malaise and falls for 1 week. EXAM: PORTABLE CHEST 1 VIEW COMPARISON:  09/13/2016 and prior radiographs FINDINGS: The cardiomediastinal silhouette is unremarkable. There is no evidence of focal airspace disease, pulmonary edema, suspicious pulmonary nodule/mass, pleural effusion, or pneumothorax. No acute bony abnormalities are identified. IMPRESSION: No active disease. Electronically Signed   By: Harmon PierJeffrey  Hu M.D.   On: 02/20/2017 16:56   Ct No Charge  Result Date: 02/27/2017 CLINICAL DATA:  Syncope and fall 02/26/2017. Right shoulder pain. Initial encounter. EXAM: CT OF THE UPPER RIGHT EXTREMITY WITHOUT CONTRAST TECHNIQUE: Multidetector CT imaging of the upper right extremity was performed according to the standard protocol. COMPARISON:  None. FINDINGS: Bones/Joint/Cartilage Provided images are reformatted from chest CT and cervical spine CT performed yesterday. No fracture is identified. The acromioclavicular joint is abnormally widened at 1.1 cm and the distal clavicle appears elevated relative to the acromion. The humeral head is located. Ligaments Suboptimally assessed by CT. Muscles and Tendons Appear intact. Soft tissues Soft tissue swelling and contusion are seen [facility]. IMPRESSION: Negative for fracture. Abnormal appearance of the acromioclavicular joint is consistent with separation, at least grade 2. Mild elevation of the distal clavicle relative to the acromion is worrisome for grade 3 AC separation and coracoclavicular ligament tear. Ligaments cannot be adequately assessed with CT scan. Electronically Signed   By: Drusilla Kannerhomas  Dalessio M.D.   On: 02/27/2017  12:16    Time Spent in minutes  30   Susa Raring M.D on 03/01/2017 at 10:06 AM  Between 7am to 7pm - Pager - [phone] ( page via [email or web address], text pages only, please mention full 10 digit call back number). After 7pm go to [email or web address] - password [email or web address]

## 2017-03-01 NOTE — Evaluation (Signed)
Occupational Therapy Evaluation Patient Details Name: Alvin Benton MRN: [id number] DOB: September 15, 1951 Today's Date: 03/01/2017    History of Present Illness Pt is a 66 yo male admitted following a syncopal episode in a stairwell on 02/26/17. Fall is suspected to be related to ETOH abuse as pt was intoxicated when arrived at the hospital. Imaging has ruled out fractures in right shoulder, ribs and spine and no acute findings of brain. Ortho consulted and will get another CT of right shoulder with supsect injury. PMH significant for ETOH abuse, GERD, HTN, IBS.    Clinical Impression   PT admitted with s/p fall with R shoulder injury and detox from ETOH. Pt currently with functional limitiations due to the deficits listed below (see OT problem list). Pt with very ataxic movement that limits all adls and requires steady (A) for adls.  Pt will benefit from skilled OT to increase their independence and safety with adls and balance to allow discharge SNF. Pt can not transfer at this time without MOD (A) and rest break after 5 ft in a seated position.     Follow Up Recommendations  SNF    Equipment Recommendations  Other (comment) (defer to SNF)    Recommendations for Other Services       Precautions / Restrictions Precautions Precautions: Fall Precaution Comments: alcohol withdrawl      Mobility Bed Mobility Overal bed mobility: Modified Independent                Transfers Overall transfer level: Needs assistance Equipment used: 1 person hand held assist Transfers: Sit to/from Stand Sit to Stand: Min assist         General transfer comment: pt requires incr assistance with movement with ataxic movemnt. pt with very tiny steps and LOB in all directions    Balance             Standing balance-Leahy Scale: Poor                             ADL either performed or assessed with clinical judgement   ADL Overall ADL's : Needs  assistance/impaired Eating/Feeding: Set up   Grooming: Minimal assistance Grooming Details (indicate cue type and reason): requires seated position Upper Body Bathing: Minimal assistance   Lower Body Bathing: Minimal assistance           Toilet Transfer: Moderate assistance           Functional mobility during ADLs: Moderate assistance;Cane General ADL Comments: pt requires cane and constant cues for NWB on R UE. pt expressed desire to remove R UE from sling and no awareness to precautions for NWB      Vision         Perception     Praxis      Pertinent Vitals/Pain Pain Assessment: Faces Faces Pain Scale: Hurts even more Pain Location: R shoulder and lower back Pain Descriptors / Indicators: Grimacing Pain Intervention(s): Monitored during session;Premedicated before session;Repositioned     Hand Dominance Right   Extremity/Trunk Assessment Upper Extremity Assessment Upper Extremity Assessment: Generalized weakness   Lower Extremity Assessment Lower Extremity Assessment: Defer to PT evaluation   Cervical / Trunk Assessment Cervical / Trunk Assessment: Normal   Communication Communication Communication: No difficulties   Cognition Arousal/Alertness: Awake/alert Behavior During Therapy: Anxious;Restless Overall Cognitive Status: Impaired/Different from baseline  General Comments: Pt agitated and has tremors. 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         OT Problem List: Decreased activity tolerance;Decreased cognition;Decreased knowledge of precautions;Decreased safety awareness;Decreased strength;Decreased knowledge of use of DME or AE;Pain      OT Treatment/Interventions: Self-care/ADL training;Therapeutic exercise;DME and/or AE instruction;Neuromuscular education;Energy conservation;Therapeutic activities;Cognitive remediation/compensation;Patient/family education;Balance training    OT Goals(Current goals can be found in the care plan section) Acute Rehab OT Goals Patient Stated Goal: to take this sling off OT Goal Formulation: With patient Time For Goal Achievement: 03/15/17 Potential to Achieve Goals: Good  OT Frequency: Min 2X/week   Barriers to D/C:            Co-evaluation              AM-PAC PT "6 Clicks" Daily Activity     Outcome Measure Help from another person eating meals?: None Help from another person taking care of personal grooming?: A Little Help from another person toileting, which includes using toliet, bedpan, or urinal?: A Lot Help from another person bathing (including washing, rinsing, drying)?: A Lot Help from another person to put on and taking off regular upper body clothing?: A Little Help from another person to put on and taking off regular lower body clothing?: A Lot 6 Click Score: 16   End of Session Equipment Utilized During Treatment: Gait belt Nurse Communication: Mobility status;Precautions  Activity Tolerance: Patient tolerated treatment well Patient left: in chair;with call bell/phone within reach;with chair alarm set  OT Visit Diagnosis: Unsteadiness on feet (R26.81)                Time: 8119-1478 OT Time Calculation (min): 14 min Charges:  OT General Charges $OT Visit: 1 Procedure OT Evaluation $OT Eval Moderate Complexity: 1 Procedure G-Codes:      [name]   OTR/L Pager: [phone] Office:  [phone] .   [name] 03/01/2017, 2:55 PM

## 2017-03-01 NOTE — Progress Notes (Signed)
Orthopedic Tech Progress Note Patient Details:  Alvin Benton 08/11/1951 [id number]  Ortho Devices Type of Ortho Device: Sling immobilizer Ortho Device/Splint Interventions: Application   Saul FordyceJennifer C Alexei Ey 03/01/2017, 10:14 AM

## 2017-03-01 NOTE — Progress Notes (Signed)
Pt is frequently up and down to bed and chair throughout the shift, CIWA is in progress, vitals stable, medicated for pain and anxiety, will continue to monitor

## 2017-03-02 LAB — TROPONIN I: Troponin I: <0.03 ng/mL (ref <0.03)

## 2017-03-02 MED ORDER — LORAZEPAM 1 MG PO TABS
1.0000 mg | ORAL_TABLET | ORAL | Status: DC | PRN
  Administered 2017-03-03: 1 mg via ORAL
  Filled 2017-03-02: qty 1

## 2017-03-02 MED ORDER — LORAZEPAM 2 MG/ML IJ SOLN
2.0000 mg | Freq: Once | INTRAMUSCULAR | Status: AC
  Administered 2017-03-02: 2 mg via INTRAVENOUS
  Filled 2017-03-02: qty 1

## 2017-03-02 MED ORDER — OXYCODONE HCL 5 MG PO TABS: 5.0000 mg | ORAL_TABLET | ORAL | 0 refills | Status: DC | PRN

## 2017-03-02 MED ORDER — NITROGLYCERIN 0.4 MG SL SUBL
0.4000 mg | SUBLINGUAL_TABLET | SUBLINGUAL | Status: DC | PRN
Start: 2017-03-02 — End: 2017-03-03
  Administered 2017-03-02: 0.4 mg via SUBLINGUAL

## 2017-03-02 MED ORDER — THIAMINE HCL 100 MG/ML IJ SOLN
100.0000 mg | Freq: Every day | INTRAMUSCULAR | Status: DC
  Administered 2017-03-02: 100 mg via INTRAVENOUS
  Filled 2017-03-02: qty 2

## 2017-03-02 MED ORDER — CHLORDIAZEPOXIDE HCL 5 MG PO CAPS
10.0000 mg | ORAL_CAPSULE | Freq: Once | ORAL | Status: AC
  Administered 2017-03-02: 10 mg via ORAL
  Filled 2017-03-02: qty 2

## 2017-03-02 MED ORDER — LORAZEPAM 2 MG/ML IJ SOLN: 2.0000 mg | Freq: Four times a day (QID) | INTRAMUSCULAR | Status: DC | PRN

## 2017-03-02 MED ORDER — NITROGLYCERIN 0.4 MG SL SUBL
SUBLINGUAL_TABLET | SUBLINGUAL | Status: AC
  Administered 2017-03-02: 0.4 mg
  Filled 2017-03-02: qty 1

## 2017-03-02 MED ORDER — CHLORDIAZEPOXIDE HCL 5 MG PO CAPS
10.0000 mg | ORAL_CAPSULE | Freq: Three times a day (TID) | ORAL | Status: DC
  Administered 2017-03-02 – 2017-03-03 (×3): 10 mg via ORAL
  Filled 2017-03-02 (×3): qty 2

## 2017-03-02 MED ORDER — CHLORDIAZEPOXIDE HCL 5 MG PO CAPS
5.0000 mg | ORAL_CAPSULE | Freq: Three times a day (TID) | ORAL | Status: DC
  Administered 2017-03-02: 5 mg via ORAL
  Filled 2017-03-02: qty 1

## 2017-03-02 NOTE — Progress Notes (Signed)
RN called into pt room due to pt experiencing chest pain that he states "occasionally runs down his left arm". Pt rated pain as pressure at 4/10. BP 133/63 HR 75. After two nitro's, pain resolved at a 0/10. BP 99/60 HR 83.  Paged MD to make aware. MD placed new orders. Will monitor.

## 2017-03-02 NOTE — Progress Notes (Signed)
PROGRESS NOTE                                                                                                                                                                                                             Patient Demographics:    Alvin Benton, is a 66 y.o. male, DOB - 08-20-1951, ZOX:[id number]  Admit date - 02/26/2017   Admitting Physician Ozella Rocks, MD  Outpatient Primary MD for the patient is Marilynn Rail, MD  LOS - 4  Chief Complaint  Patient presents with  . Fall  . Chest Pain       Brief Narrative  Alvin Benton is a 66 y.o. male with medical history significant of EtOH abuse, GERD, hypertension, migraines, IBS, tobacco abuse. Patient presenting to [facility] emergency room after syncopal episode, apparently he was leaving for work and fell through a flight of stairs, he does not recall passing out, he did say that he had some dizziness prior to the fall with sensation of some spinning, note his alcohol level was extremely high upon admission and he now except that he drank before the fall. He says he drinks on a daily basis and he drinks a lot.   Subjective:   Patient in bed, appears comfortable, denies any headache, no fever, no chest pain or pressure, no shortness of breath , no abdominal pain. No focal weakness. +ve R shoulder pain.   Assessment  & Plan :     1. Syncope and fall. Most likely mechanical fall due to alcohol intoxication. Negative CT head and C-spine, MRI brain negative as well. No headache or focal deficits. He was not orthostatic and had a stable echocardiogram, seen by PT due for SNF discharge.  2.  Alcohol abuse. Currently no signs of DTs continue Librium but taper down the dose continue CIWA protocol, counseled to quit by me and social work.  3. Right shoulder grade 3 AC separation and coracoclavicular ligament tear. Right shoulder and flank hematoma. Due to the fall at  home, continue supportive care and pain control, seen by orthopedics, right shoulder sling will be provided, will qualify for SNF likely discharge tomorrow  4. Hypertension. Stable on present dose of Coreg and hydralazine.  5. Paroxysmal atrial fibrillation. [place] vasc 2 score of at least 2. History of alcohol binging and multiple falls, poor  candidate for anticoagulation, placed on Coreg and aspirin.  6. Fractures: non-displaced R Zygomatic fracture and R 2nd and 3rd proximal comminuted fractures noted. Correlates w/ pts complaints of back/chest pain, supportive care.  7. Atelectasis with small left pleural effusion. Add an incentive spirometry. Clinically no CHF.    Diet : Diet regular Room service appropriate? Yes; Fluid consistency: Thin    Family Communication  :  None  Code Status :  Full  Disposition Plan  :  SNF in am  Consults  :  Ortho  Procedures  :    CT head, CT C-spine, MRI brain. All nonacute.  MRI brain - non acute  CT right shoulder - AC seperation  DVT Prophylaxis  :   SCDs    Lab Results  Component Value Date   PLT 351 02/28/2017    Inpatient Medications  Scheduled Meds: . aspirin  81 mg Oral Daily  . carvedilol  12.5 mg Oral BID WC  . chlordiazePOXIDE  5 mg Oral TID  . folic acid  1 mg Oral Daily  . hydrALAZINE  50 mg Oral Q8H  . multivitamin with minerals  1 tablet Oral Daily  . thiamine  100 mg Oral Daily   Continuous Infusions:  PRN Meds:.acetaminophen, albuterol, fentaNYL (SUBLIMAZE) injection, hydrALAZINE, ketorolac, nicotine, ondansetron **OR** ondansetron (ZOFRAN) IV, oxyCODONE  Antibiotics  :    Anti-infectives    None         Objective:   Vitals:   03/01/17 1543 03/01/17 1953 03/02/17 0000 03/02/17 0423  BP: 138/69 116/61 124/77 (!) 145/74  Pulse: 73 66 68 68  Resp: 18 18    Temp: 98.6 F (37 C) 97.8 F (36.6 C)  97.6 F (36.4 C)  TempSrc: Oral Oral  Oral  SpO2: 96% 98%  98%  Weight:    69.9 kg (154 lb 3.2 oz)    Height:        Wt Readings from Last 3 Encounters:  03/02/17 69.9 kg (154 lb 3.2 oz)  02/20/17 76.8 kg (169 lb 5 oz)  10/03/16 71.2 kg (157 lb)     Intake/Output Summary (Last 24 hours) at 03/02/17 0930 Last data filed at 03/02/17 0500  Gross per 24 hour  Intake             3680 ml  Output             1625 ml  Net             2055 ml     Physical Exam  Awake Alert, Oriented X 3, anxious [place].AT,PERRAL Supple Neck,No JVD, No cervical lymphadenopathy appriciated.  Symmetrical Chest wall movement, Good air movement bilaterally, CTAB RRR,No Gallops,Rubs or new Murmurs, No Parasternal Heave +ve B.Sounds, Abd Soft, No tenderness, No organomegaly appriciated, No rebound - guarding or rigidity. No Cyanosis, Clubbing or edema,  multiple old bruises on the R shoulder and chest wall, Painful range of motion in the right shoulder     Data Review:    CBC  Recent Labs Lab 02/26/17 0835 02/26/17 1648 02/27/17 0038 02/27/17 0355 02/28/17 0328  WBC 10.1  --   --  9.2 8.4  HGB 12.0* 12.5* 10.9* 10.4* 9.7*  HCT 34.6* 36.5* 32.1* 31.1* 28.8*  PLT 380  --   --  380 351  MCV 93.5  --   --  94.5 95.0  MCH 32.4  --   --  31.6 32.0  MCHC 34.7  --   --  33.4  33.7  RDW 13.1  --   --  13.4 13.5  LYMPHSABS 1.3  --   --   --   --   MONOABS 0.8  --   --   --   --   EOSABS 0.1  --   --   --   --   BASOSABS 0.1  --   --   --   --     Chemistries   Recent Labs Lab 02/26/17 0835 02/27/17 0355 02/28/17 0328  NA 137 133* 132*  K 3.7 4.0 4.3  CL 103 97* 98*  CO2 26 26 25   GLUCOSE 101* 110* 117*  BUN 6 11 7   CREATININE 0.81 0.80 0.76  CALCIUM 9.1 8.5* 8.8*  MG  --   --  1.9  AST 52* 37  --   ALT 44 32  --   ALKPHOS 41 44  --   BILITOT 0.2* 0.9  --    ------------------------------------------------------------------------------------------------------------------ No results for input(s): CHOL, HDL, LDLCALC, TRIG, CHOLHDL, LDLDIRECT in the last 72 hours.  Lab Results   Component Value Date   HGBA1C 4.4 (L) 08/27/2016   ------------------------------------------------------------------------------------------------------------------ No results for input(s): TSH, T4TOTAL, T3FREE, THYROIDAB in the last 72 hours.  Invalid input(s): FREET3 ------------------------------------------------------------------------------------------------------------------ No results for input(s): VITAMINB12, FOLATE, FERRITIN, TIBC, IRON, RETICCTPCT in the last 72 hours.  Coagulation profile No results for input(s): INR, PROTIME in the last 168 hours.  No results for input(s): DDIMER in the last 72 hours.  Cardiac Enzymes  Recent Labs Lab 02/26/17 0835  TROPONINI <0.03   ------------------------------------------------------------------------------------------------------------------    Component Value Date/Time   BNP 156.2 (H) 02/26/2017 1356    Micro Results Recent Results (from the past 240 hour(s))  Blood Culture (routine x 2)     Status: None   Collection Time: 02/20/17  4:30 PM  Result Value Ref Range Status   Specimen Description BLOOD RIGHT FOREARM  Final   Special Requests   Final    BOTTLES DRAWN AEROBIC AND ANAEROBIC Blood Culture adequate volume   Culture NO GROWTH 5 DAYS  Final   Report Status 02/25/2017 FINAL  Final  Blood Culture (routine x 2)     Status: None   Collection Time: 02/20/17  6:30 PM  Result Value Ref Range Status   Specimen Description BLOOD LEFT FOREARM  Final   Special Requests   Final    BOTTLES DRAWN AEROBIC AND ANAEROBIC Blood Culture adequate volume   Culture NO GROWTH 5 DAYS  Final   Report Status 02/25/2017 FINAL  Final    Radiology Reports Ct Abdomen Pelvis Wo Contrast  Result Date: 02/26/2017 CLINICAL DATA:  Pt states he fell yesterday and now has pain on right side of abdomen. Pt endorses n/v, denies diarrhea. HTN, IBS, diverticulitis EXAM: CT ABDOMEN AND PELVIS WITHOUT CONTRAST TECHNIQUE: Multidetector CT imaging  of the abdomen and pelvis was performed following the standard protocol without IV contrast. COMPARISON:  CT of abdomen and pelvis 02/20/2017 FINDINGS: Lower chest: Interval development of small right pleural effusion. There is bibasilar atelectasis. Heart size is normal. Hepatobiliary: No focal liver abnormality is seen. No gallstones, gallbladder wall thickening, or biliary dilatation. Pancreas: Unremarkable. No pancreatic ductal dilatation or surrounding inflammatory changes. Spleen: Normal in size without focal abnormality. Adrenals/Urinary Tract: Adrenals are normal in appearance. Normal appearance of both kidneys. Contrast is identified in the collecting systems following prior contrast exam. Urinary bladder is normal in appearance, opacified with contrast following recent contrast exam. Stomach/Bowel: The  stomach and small bowel loops are normal in appearance. The appendix is well seen and has a normal appearance. Colon contains moderate stool burden. No evidence for obstruction. Suture line identified in the rectosigmoid colon. Vascular/Lymphatic: There is atherosclerotic calcification of the abdominal aorta. No retroperitoneal or mesenteric adenopathy. Reproductive: Prostate is enlarged. Other: Within the region of the right flank, there is significant edema and subcutaneous stranding, new since the most recent CT exam. The hematoma measures 5.9 x 8.1 cm. There is no underlying fracture. Musculoskeletal: Mild degenerative changes are seen in thoracic and lumbar spine. No acute fracture or subluxation.1 IMPRESSION: 1. Large right flank hematoma measuring 5.9 x 8.1 cm and associated significant flank edema. 2. Small right pleural effusion. 3. No evidence for acute fracture. 4. Contrast within the collecting systems and urinary bladder following recent intravenous contrast administration. 5.  Aortic atherosclerosis. 6. Prostatic enlargement. Electronically Signed   By: Norva Pavlov M.D.   On: 02/26/2017  19:09   Dg Chest 2 View  Result Date: 02/26/2017 CLINICAL DATA:  Syncopal episode with resultant fall down stairs this morning. Current smoker, alcohol abuse. EXAM: CHEST  2 VIEW COMPARISON:  Portable chest x-ray dated February 20, 2017 FINDINGS: The lungs are mildly hypoinflated which is accentuated by the lordotic positioning. There is no evidence of acute infiltrate or pulmonary contusion. There is no pneumothorax or pleural effusion. The heart and pulmonary vascularity and mediastinal structures are normal. The observed bony structures exhibit no definite acute abnormality. There is widening of the right [facility] joint which has appeared since the previous study. IMPRESSION: There is no acute cardiopulmonary abnormality. New widening of the right [facility] joint may be acute. Correlation with any symptoms over the right [facility] joint is needed. Electronically Signed   By: David  [place] M.D.   On: 02/26/2017 09:33   Dg Shoulder Right  Result Date: 02/27/2017 CLINICAL DATA:  Sharp pain.  Fall. EXAM: RIGHT SHOULDER - 2+ VIEW COMPARISON:  Chest x-ray 02/27/2017.  CT chest 02/26/2017. FINDINGS: Acromioclavicular separation cannot be excluded. No evidence of fracture or dislocation. Atelectatic changes right lung base again identified, follow-up its chest x-rays demonstrate clearing suggested. IMPRESSION: 1.  Right acromioclavicular separation cannot be excluded . 2. Right base atelectatic changes again noted. Follow-up chest x-rays to demonstrate clearing suggested. Electronically Signed   By: Maisie Fus  Register   On: 02/27/2017 09:07   Ct Head Wo Contrast  Result Date: 02/26/2017 CLINICAL DATA:  Syncope with fall EXAM: CT HEAD WITHOUT CONTRAST CT CERVICAL SPINE WITHOUT CONTRAST TECHNIQUE: Multidetector CT imaging of the head and cervical spine was performed following the standard protocol without intravenous contrast. Multiplanar CT image reconstructions of the cervical spine were also generated. COMPARISON:  Head CT February 20, 2017 FINDINGS: CT HEAD FINDINGS Brain: Mild diffuse atrophy is stable. There is no intracranial mass, hemorrhage, extra-axial fluid collection, or midline shift. There is mild patchy small vessel disease in the centra semiovale bilaterally. There is no new gray-white compartment lesion. No acute infarct evident. Vascular: No demonstrable hyperdense vessel. There is slight calcification in each cavernous carotid artery. Next Skull: Bony calvarium appears intact. There is, however, an acute appearing nondisplaced fracture of the anterior right zygomatic arch. Sinuses/Orbits: There is mild mucosal thickening in several ethmoid air cells bilaterally. There is mucosal thickening in the inferior right maxillary antrum. Other paranasal sinuses which are visualized are clear. Orbits appear symmetric bilaterally. Other: Mastoid air cells are clear. CT CERVICAL SPINE FINDINGS Alignment: There is no spondylolisthesis. Skull base  and vertebrae: Skullbase and craniocervical junction regions appear normal. There is no fracture in the cervical region. No blastic or lytic bone lesions. Fracture of the right zygomatic arch described in the head CT report is noted. There are also comminuted fractures of the posterior right second rib. Soft tissues and spinal canal: Prevertebral soft tissues and predental space regions are normal. No paraspinous lesions. There is no cord or canal hematoma evident. Disc levels: There is moderately severe disc space narrowing at C4-5 and C6-7. There is moderate disc space narrowing at C5-6. There is calcification in the anterior ligament at C3-4, C4-5, and C5-6. There are anterior osteophytes at C3, C4, C5, and C6. There is facet hypertrophy with exit foraminal narrowing at C5-6 and C6-7 bilaterally. Question degrees of facet hypertrophy noted at several levels. No frank disc extrusion. Upper chest: Visualized lung regions are clear. No apical pneumothorax evident. Other: None IMPRESSION: CT head:  Nondisplaced fracture anterior right zygomatic arch. No skull fracture evident. There is stable atrophy with mild periventricular small vessel disease. No intracranial mass, hemorrhage, or extra-axial fluid collection. There is mild ethmoid air cell disease bilaterally. There is slight carotid artery calcification distally. CT cervical spine: No cervical spine region fracture or spondylolisthesis. There is multilevel arthropathy. There are comminuted fractures of the posterior right second and third ribs as well as a fracture of the right zygomatic arch. There is multifocal cervical spine arthropathy. Electronically Signed   By: Bretta Bang III M.D.   On: 02/26/2017 09:27   Ct Head Wo Contrast  Result Date: 02/20/2017 CLINICAL DATA:  Multiple falls with head injury EXAM: CT HEAD WITHOUT CONTRAST TECHNIQUE: Contiguous axial images were obtained from the base of the skull through the vertex without intravenous contrast. COMPARISON:  None. FINDINGS: Brain: No definite acute territorial infarction, hemorrhage or focal mass lesion is visualized. Mild periventricular white matter hypodensity suspect secondary to small vessel ischemic changes. Mild cortical atrophy. Slight ventricular prominence since, likely related to the presence of atrophy. Vascular: Slightly dense appearing proximal left MCA, suspect that this is secondary to vessel tortuosity. Scattered calcifications at the carotid siphons. Skull: No fracture or suspicious bone lesion. Sinuses/Orbits: Minimal mucosal thickening in the maxillary and ethmoid sinuses. No acute orbital abnormality. Other: None IMPRESSION: No definite CT evidence for acute intracranial abnormality. Mild periventricular white matter small vessel ischemic changes. Electronically Signed   By: Jasmine Pang M.D.   On: 02/20/2017 16:30   Ct Angio Chest Pe W/cm &/or Wo Cm  Result Date: 02/26/2017 CLINICAL DATA:  Mid Chest pain; syncope with fall this morning EXAM: CT ANGIOGRAPHY  CHEST WITH CONTRAST TECHNIQUE: Multidetector CT imaging of the chest was performed using the standard protocol during bolus administration of intravenous contrast. Multiplanar CT image reconstructions and MIPs were obtained to evaluate the vascular anatomy. CONTRAST:  100 cc Isovue 370 COMPARISON:  Chest x-ray 02/26/2017 FINDINGS: Cardiovascular: Heart size is normal. No imaged pericardial effusion or significant coronary artery calcifications. Pulmonary arteries are well opacified and there is no acute pulmonary embolus. There is minimal atherosclerotic calcification of the thoracic aorta. Mediastinum/Nodes: Normal appearance of the esophagus. No significant mediastinal, hilar, or axillary adenopathy. The visualized portion of the thyroid gland has a normal appearance. Lungs/Pleura: Small right pleural effusion. There is smooth septal thickening primarily involving the dependent aspects of the lungs. No suspicious pulmonary nodules. No frank consolidations. Upper Abdomen: Gallbladder is present. Musculoskeletal: Mild mid thoracic spondylosis. Review of the MIP images confirms the above findings. IMPRESSION: 1. Technically  adequate exam showing no acute pulmonary embolus. 2. Mild changes of pulmonary edema with small right pleural effusion. 3. Mild thoracic atherosclerosis. 4. Mid thoracic spondylosis. Electronically Signed   By: Norva PavlovElizabeth  Brown M.D.   On: 02/26/2017 13:03   Ct Cervical Spine Wo Contrast  Result Date: 02/26/2017 CLINICAL DATA:  Syncope with fall EXAM: CT HEAD WITHOUT CONTRAST CT CERVICAL SPINE WITHOUT CONTRAST TECHNIQUE: Multidetector CT imaging of the head and cervical spine was performed following the standard protocol without intravenous contrast. Multiplanar CT image reconstructions of the cervical spine were also generated. COMPARISON:  Head CT February 20, 2017 FINDINGS: CT HEAD FINDINGS Brain: Mild diffuse atrophy is stable. There is no intracranial mass, hemorrhage, extra-axial fluid  collection, or midline shift. There is mild patchy small vessel disease in the centra semiovale bilaterally. There is no new gray-white compartment lesion. No acute infarct evident. Vascular: No demonstrable hyperdense vessel. There is slight calcification in each cavernous carotid artery. Next Skull: Bony calvarium appears intact. There is, however, an acute appearing nondisplaced fracture of the anterior right zygomatic arch. Sinuses/Orbits: There is mild mucosal thickening in several ethmoid air cells bilaterally. There is mucosal thickening in the inferior right maxillary antrum. Other paranasal sinuses which are visualized are clear. Orbits appear symmetric bilaterally. Other: Mastoid air cells are clear. CT CERVICAL SPINE FINDINGS Alignment: There is no spondylolisthesis. Skull base and vertebrae: Skullbase and craniocervical junction regions appear normal. There is no fracture in the cervical region. No blastic or lytic bone lesions. Fracture of the right zygomatic arch described in the head CT report is noted. There are also comminuted fractures of the posterior right second rib. Soft tissues and spinal canal: Prevertebral soft tissues and predental space regions are normal. No paraspinous lesions. There is no cord or canal hematoma evident. Disc levels: There is moderately severe disc space narrowing at C4-5 and C6-7. There is moderate disc space narrowing at C5-6. There is calcification in the anterior ligament at C3-4, C4-5, and C5-6. There are anterior osteophytes at C3, C4, C5, and C6. There is facet hypertrophy with exit foraminal narrowing at C5-6 and C6-7 bilaterally. Question degrees of facet hypertrophy noted at several levels. No frank disc extrusion. Upper chest: Visualized lung regions are clear. No apical pneumothorax evident. Other: None IMPRESSION: CT head: Nondisplaced fracture anterior right zygomatic arch. No skull fracture evident. There is stable atrophy with mild periventricular small  vessel disease. No intracranial mass, hemorrhage, or extra-axial fluid collection. There is mild ethmoid air cell disease bilaterally. There is slight carotid artery calcification distally. CT cervical spine: No cervical spine region fracture or spondylolisthesis. There is multilevel arthropathy. There are comminuted fractures of the posterior right second and third ribs as well as a fracture of the right zygomatic arch. There is multifocal cervical spine arthropathy. Electronically Signed   By: Bretta BangWilliam  Woodruff III M.D.   On: 02/26/2017 09:27   Mr Brain Wo Contrast  Result Date: 02/26/2017 CLINICAL DATA:  Syncopal episode at home. LEFT chest pain. History of migraines, chronic alcohol abuse, hypertension. EXAM: MRI HEAD WITHOUT CONTRAST TECHNIQUE: Multiplanar, multiecho pulse sequences of the brain and surrounding structures were obtained without intravenous contrast. COMPARISON:  CT HEAD Feb 26, 2017 at 0906 hours FINDINGS: Severely limited FLAIR sequence due to artifact. BRAIN: No reduced diffusion to suggest acute ischemia. No susceptibility artifact to suggest hemorrhage. Moderate to severe ventriculomegaly on the basis of parenchymal brain volume loss as there is overall commensurate enlargement of cerebral sulci and cerebellar folia. No suspicious  parenchymal signal, masses or mass effect. Patchy supratentorial white matter FLAIR T2 hyperintensities. No abnormal extra-axial fluid collections. LEFT inferior cerebellar developmental venous anomaly, benign finding. VASCULAR: Normal major intracranial vascular flow voids present at skull base. SKULL AND UPPER CERVICAL SPINE: No abnormal sellar expansion. No suspicious calvarial bone marrow signal. Craniocervical junction maintained. SINUSES/ORBITS: The mastoid air-cells and included paranasal sinuses are well-aerated. The included ocular globes and orbital contents are non-suspicious. OTHER: None. IMPRESSION: No acute intracranial process. Moderate to severe  parenchymal brain volume loss and mild chronic small vessel ischemic disease. Electronically Signed   By: Awilda Metro M.D.   On: 02/26/2017 18:34   Ct Abdomen Pelvis W Contrast  Result Date: 02/20/2017 CLINICAL DATA:  Initial evaluation for acute abdominal pain, fever, weakness, nausea. EXAM: CT ABDOMEN AND PELVIS WITH CONTRAST TECHNIQUE: Multidetector CT imaging of the abdomen and pelvis was performed using the standard protocol following bolus administration of intravenous contrast. CONTRAST:  ISOVUE-300 IOPAMIDOL (ISOVUE-300) INJECTION 61% COMPARISON:  Prior CT from 06/04/2016. FINDINGS: Lower chest: Mild bibasilar atelectatic changes. Visualized lung bases are otherwise clear. Hepatobiliary: Diffuse hypoattenuation liver consistent with steatosis. Liver otherwise unremarkable. Gallbladder somewhat prominent. Minimal hazy pericholecystic hazy stranding/edema. No internal cholelithiasis. No biliary dilatation. Pancreas: Pancreas within normal limits. Spleen: Spleen within normal limits. Adrenals/Urinary Tract: Adrenal glands are normal. Kidneys equal in size with symmetric enhancement. No nephrolithiasis, hydronephrosis, or focal enhancing renal mass. No hydroureter. Bladder fairly distended without acute abnormality. Stomach/Bowel: Stomach within normal limits. No evidence for bowel obstruction appendix within normal limits. Sequelae of prior partial colectomy with surgical anastomosis within the sigmoid colon noted. No acute inflammatory changes seen about the bowels. Vascular/Lymphatic: Mild aorto bi-iliac atherosclerotic disease. No aneurysm. For opacification of the IMA proximally (series 3, image 47). Remaining branch vessels are well opacified and within normal limits. No pathologically enlarged intra-abdominal or pelvic lymph nodes. Reproductive: Prostate mildly enlarged measuring 5.2 cm in transverse diameter, similar to previous. Other: No free air or fluid. Musculoskeletal: No acute  osseous abnormality. No worrisome lytic or blastic osseous lesions. Scattered multilevel degenerative spondylolysis and facet arthrosis noted within the visualized spine. IMPRESSION: 1. Mildly prominent gallbladder with trace pericholecystic stranding/edema. Correlation with laboratory values for possible acute gallbladder pathology recommended. Additionally, further evaluation with dedicated right upper quadrant ultrasound could be performed for further evaluation as warranted. 2. Poor opacification of the proximal IMA. Findings of uncertain significance, and may be simply related to technique on this non angiographic examination. Correlation with physical exam and serum lactate recommended for potential signs of intestinal ischemia. No other secondary signs to suggest acute bowel ischemia identified on this exam. 3. Hepatic steatosis. 4. Mildly enlarged prostate. Prominent urinary bladder distension may reflect a degree of underlying outlet obstruction. Electronically Signed   By: Rise Mu M.D.   On: 02/20/2017 19:16   Mr Shoulder Right Wo Contrast  Result Date: 02/28/2017 CLINICAL DATA:  Status post syncope and fall 02/26/2017 with a right shoulder injury and pain. Possible AC joint separation. Initial encounter. EXAM: MRI OF THE RIGHT SHOULDER WITHOUT CONTRAST TECHNIQUE: Multiplanar, multisequence MR imaging of the shoulder was performed. No intravenous contrast was administered. COMPARISON:  Plain films left shoulder 02/27/2017. CT scan 02/26/2017. FINDINGS: Rotator cuff:  Intact. Muscles:  No atrophy or focal lesion. Biceps long head:  Intact. Acromioclavicular Joint: The AC joint is abnormally widened with edema and hemorrhage within and about it consistent AC joint separation. The coracoclavicular ligament is completely torn resulting in elevation of the distal  clavicle relative to the acromion. Type 2 acromion. No subacromial/subdeltoid bursal fluid. Glenohumeral Joint: Negative. Labrum:   Intact. Bones:  No fracture. Other: None. IMPRESSION: The study is positive for a grade 3 AC joint separation. The rotator cuff and long head of biceps tendons are intact. Electronically Signed   By: Drusilla Kanner M.D.   On: 02/28/2017 13:56   Ct T-spine No Charge  Result Date: 02/27/2017 CLINICAL DATA:  Fall yesterday.  Upper back pain. EXAM: CT THORACIC SPINE WITHOUT CONTRAST TECHNIQUE: Multidetector CT images of the thoracic were obtained using the standard protocol without intravenous contrast. COMPARISON:  None. FINDINGS: Alignment: Normal Vertebrae: No fracture Paraspinal and other soft tissues: No epidural or paraspinal soft tissue swelling/hematoma. Disc levels: The disc space narrowing and spurring in the mid and lower thoracic spine. IMPRESSION: No evidence of thoracic fracture.  Mild spondylosis. Electronically Signed   By: Charlett Nose M.D.   On: 02/27/2017 10:43   Dg Chest Port 1 View  Result Date: 02/27/2017 CLINICAL DATA:  Followup for pulmonary edema/pleural effusion. Patient with cough and dyspnea. Patient fell yesterday. EXAM: PORTABLE CHEST 1 VIEW COMPARISON:  02/26/2017 FINDINGS: Basilar lung opacity, right greater left, has increased since the prior exam, consistent with atelectasis. Remainder of the lungs is clear. The pleural effusions evident on the previous day's CT is not resolved radiographically. No pneumothorax. Cardiac silhouette is normal in size. No mediastinal or hilar masses. IMPRESSION: 1. Mild increase in lung base opacity since the previous day's studies consistent with atelectasis. No convincing pneumonia or pulmonary edema. No pneumothorax. Electronically Signed   By: Amie Portland M.D.   On: 02/27/2017 07:46   Dg Chest Port 1 View  Result Date: 02/20/2017 CLINICAL DATA:  Weakness, malaise and falls for 1 week. EXAM: PORTABLE CHEST 1 VIEW COMPARISON:  09/13/2016 and prior radiographs FINDINGS: The cardiomediastinal silhouette is unremarkable. There is no evidence  of focal airspace disease, pulmonary edema, suspicious pulmonary nodule/mass, pleural effusion, or pneumothorax. No acute bony abnormalities are identified. IMPRESSION: No active disease. Electronically Signed   By: Harmon Pier M.D.   On: 02/20/2017 16:56   Ct No Charge  Result Date: 02/27/2017 CLINICAL DATA:  Syncope and fall 02/26/2017. Right shoulder pain. Initial encounter. EXAM: CT OF THE UPPER RIGHT EXTREMITY WITHOUT CONTRAST TECHNIQUE: Multidetector CT imaging of the upper right extremity was performed according to the standard protocol. COMPARISON:  None. FINDINGS: Bones/Joint/Cartilage Provided images are reformatted from chest CT and cervical spine CT performed yesterday. No fracture is identified. The acromioclavicular joint is abnormally widened at 1.1 cm and the distal clavicle appears elevated relative to the acromion. The humeral head is located. Ligaments Suboptimally assessed by CT. Muscles and Tendons Appear intact. Soft tissues Soft tissue swelling and contusion are seen about the [facility] joint. IMPRESSION: Negative for fracture. Abnormal appearance of the acromioclavicular joint is consistent with separation, at least grade 2. Mild elevation of the distal clavicle relative to the acromion is worrisome for grade 3 AC separation and coracoclavicular ligament tear. Ligaments cannot be adequately assessed with CT scan. Electronically Signed   By: Drusilla Kanner M.D.   On: 02/27/2017 12:16    Time Spent in minutes  30   Susa Raring M.D on 03/02/2017 at 9:30 AM  Between 7am to 7pm - Pager - [phone] ( page via [email or web address], text pages only, please mention full 10 digit call back number). After 7pm go to [email or web address] - password [email or web address]

## 2017-03-03 LAB — TROPONIN I

## 2017-03-03 MED ORDER — HYDRALAZINE HCL 50 MG PO TABS: 50.0000 mg | ORAL_TABLET | Freq: Three times a day (TID) | ORAL | Status: DC

## 2017-03-03 MED ORDER — HYDRALAZINE HCL 50 MG PO TABS
50.0000 mg | ORAL_TABLET | Freq: Three times a day (TID) | ORAL | 0 refills | Status: DC
Start: 2017-03-03 — End: 2017-06-05

## 2017-03-03 MED ORDER — CARVEDILOL 12.5 MG PO TABS: 12.5000 mg | ORAL_TABLET | Freq: Two times a day (BID) | ORAL | Status: DC

## 2017-03-03 MED ORDER — NICOTINE 14 MG/24HR TD PT24: 14.0000 mg | MEDICATED_PATCH | Freq: Every day | TRANSDERMAL | 0 refills | Status: DC | PRN

## 2017-03-03 MED ORDER — VALSARTAN-HYDROCHLOROTHIAZIDE 160-25 MG PO TABS: 1.0000 | ORAL_TABLET | Freq: Every day | ORAL | 0 refills | Status: DC

## 2017-03-03 MED ORDER — ASPIRIN 81 MG PO CHEW: 81.0000 mg | CHEWABLE_TABLET | Freq: Every day | ORAL | Status: DC

## 2017-03-03 MED ORDER — FOLIC ACID 1 MG PO TABS: 1.0000 mg | ORAL_TABLET | Freq: Every day | ORAL | Status: DC

## 2017-03-03 MED ORDER — FOLIC ACID 1 MG PO TABS: 1.0000 mg | ORAL_TABLET | Freq: Every day | ORAL | 0 refills | Status: DC

## 2017-03-03 MED ORDER — THIAMINE HCL 100 MG/ML IJ SOLN: 100.0000 mg | Freq: Every day | INTRAMUSCULAR | Status: DC

## 2017-03-03 MED ORDER — CHLORDIAZEPOXIDE HCL 5 MG PO CAPS: ORAL_CAPSULE | ORAL | 0 refills | Status: DC

## 2017-03-03 MED ORDER — ASPIRIN 81 MG PO CHEW: 81.0000 mg | CHEWABLE_TABLET | Freq: Every day | ORAL | 0 refills | Status: DC

## 2017-03-03 MED ORDER — THIAMINE HCL 100 MG PO TABS: 100.0000 mg | ORAL_TABLET | Freq: Every day | ORAL | Status: DC

## 2017-03-03 MED ORDER — VITAMIN B-1 100 MG PO TABS
100.0000 mg | ORAL_TABLET | Freq: Every day | ORAL | Status: DC
  Administered 2017-03-03: 100 mg via ORAL
  Filled 2017-03-03: qty 1

## 2017-03-03 NOTE — Clinical Social Work Note (Signed)
CSW facilitated patient discharge including contacting patient family and facility to confirm patient discharge plans. Clinical information faxed to facility and family agreeable with plan. CSW arranged ambulance transport via [facility] to [facility]. RN to call report prior to discharge ([phone] Room [id number]).  CSW will sign off for now as social work intervention is no longer needed. Please consult us again if new needs arise.  Glyn Gerads, CSW [phone]   

## 2017-03-03 NOTE — Progress Notes (Signed)
RN called to pt room to see pt belonging bag. Pt had multiple cigarette lighters along with two packs of cigarettes in his belongings. RN also found an e-cigarette and 3 empty airplane liquor bottles. Belongings placed in AD's office. Paged MD to make aware, as pt has orders to be DC today.

## 2017-03-03 NOTE — Discharge Summary (Addendum)
Add Dinapoli AVW:[id number] DOB: November 27, 1950 DOA: 02/26/2017  PCP: Marilynn Rail, MD  Admit date: 02/26/2017  Discharge date: 03/03/2017  Admitted From: Home  Disposition:  SNF   Recommendations for Outpatient Follow-up:   Follow up with PCP in 1-2 weeks  PCP Please obtain BMP/CBC, 2 view CXR in 1week,  (see Discharge instructions)   PCP Please follow up on the following pending results: None   Home Health: None   Equipment/Devices: None  Consultations: Ortho Discharge Condition: Stable   CODE STATUS: Full   Diet Recommendation:  Heart Healthy    Chief Complaint  Patient presents with  . Fall  . Chest Pain     Brief history of present illness from the day of admission and additional interim summary    Windsor Sarveris a 66 y.o.malewith medical history significant of EtOH abuse, GERD, hypertension, migraines, IBS, tobacco abuse. Patient presenting to [facility] emergency room after syncopal episode, apparently he was leaving for work and fell through a flight of stairs, he does not recall passing out, he did say that he had some dizziness prior to the fall with sensation of some spinning, note his alcohol level was extremely high upon admission and he now except that he drank before the fall. He says he drinks on a daily basis and he drinks a lot.                                                                 Hospital Course    1. Syncope and fall. Most likely mechanical fall due to alcohol intoxication. Negative CT head and C-spine, MRI brain negative as well. No headache or focal deficits. He was not orthostatic and had a stable echocardiogram, seen by PT due for SNF discharge.  2.  Alcohol abuse. Currently no signs of DTs continue Librium Taper, Foley catheter and thiamine, he is mildly anxious but not  in frank DTs, continue to monitor at SNF. Counseled to quit alcohol.  3. Right shoulder grade 3 AC separation and coracoclavicular ligament tear. Right shoulder and flank hematoma. Due to the fall at home, continue supportive care and pain control, seen by orthopedics, right shoulder sling will be provided which he must wear at all times except when taking shower, no weightbearing or lifting through right upper extremity, will qualify for SNF likely discharge today, must follow with orthopedics in 1-2 weeks post discharge.  4. Hypertension. Stable on present dose of Coreg and hydralazine.  5. Paroxysmal atrial fibrillation. [place] vasc 2 score of at least 2. History of alcohol binging and multiple falls, poor candidate for anticoagulation, placed on Coreg and aspirin.  6. Fractures: non-displaced R Zygomatic fracture and R 2nd and 3rd proximal comminuted fractures noted. Correlates w/ pts complaints of back/chest pain, supportive care. Monitor clinically.  7. Atelectasis  with small left pleural effusion. In use incentive spirometry every hour while awake at SNF, encourage sitting up in the chair. Clinically no CHF.   Discharge diagnosis     Active Problems:   Tobacco use disorder   Alcohol abuse   Essential hypertension   Syncope   History of atrial fibrillation   Right flank hematoma   Zygomatic fracture, right side, initial encounter for closed fracture [facility])   Multiple rib fractures    Discharge instructions    Discharge Instructions    Diet - low sodium heart healthy    Complete by:  As directed    Discharge instructions    Complete by:  As directed    Wear sling for comfort. Ok to remove for shower and dressing. No lifting with right arm.  Follow with Primary MD Marilynn Rail, MD in 7 days   Get CBC, CMP, 2 view Chest X ray checked  by Primary MD or SNF MD in 5-7 days ( we routinely change or add medications that can affect your baseline labs and fluid status,  therefore we recommend that you get the mentioned basic workup next visit with your PCP, your PCP may decide not to get them or add new tests based on their clinical decision)  Activity: As tolerated with Full fall precautions use walker/cane & assistance as needed  Disposition SNF  Diet:   Heart Healthy with feeding assistance and aspiration precautions.  For Heart failure patients - Check your Weight same time everyday, if you gain over 2 pounds, or you develop in leg swelling, experience more shortness of breath or chest pain, call your Primary MD immediately. Follow Cardiac Low Salt Diet and 1.5 lit/day fluid restriction.  On your next visit with your primary care physician please Get Medicines reviewed and adjusted.  Please request your Prim.MD to go over all Hospital Tests and Procedure/Radiological results at the follow up, please get all Hospital records sent to your Prim MD by signing hospital release before you go home.  If you experience worsening of your admission symptoms, develop shortness of breath, life threatening emergency, suicidal or homicidal thoughts you must seek medical attention immediately by calling 911 or calling your MD immediately  if symptoms less severe.  You Must read complete instructions/literature along with all the possible adverse reactions/side effects for all the Medicines you take and that have been prescribed to you. Take any new Medicines after you have completely understood and accpet all the possible adverse reactions/side effects.   Do not drive, operate heavy machinery, perform activities at heights, swimming or participation in water activities or provide baby sitting services if your were admitted for syncope or siezures until you have seen by Primary MD or a Neurologist and advised to do so again.  Do not drive when taking Pain medications.    Do not take more than prescribed Pain, Sleep and Anxiety Medications  Special Instructions: If you  have smoked or chewed Tobacco  in the last 2 yrs please stop smoking, stop any regular Alcohol  and or any Recreational drug use.  Wear Seat belts while driving.   Please note  You were cared for by a hospitalist during your hospital stay. If you have any questions about your discharge medications or the care you received while you were in the hospital after you are discharged, you can call the unit and asked to speak with the hospitalist on call if the hospitalist that took care of you is not available.  Once you are discharged, your primary care physician will handle any further medical issues. Please note that NO REFILLS for any discharge medications will be authorized once you are discharged, as it is imperative that you return to your primary care physician (or establish a relationship with a primary care physician if you do not have one) for your aftercare needs so that they can reassess your need for medications and monitor your lab values.   Increase activity slowly    Complete by:  As directed       Discharge Medications   Allergies as of 03/03/2017   No Known Allergies     Medication List    STOP taking these medications   aspirin 325 MG tablet Replaced by:  aspirin 81 MG chewable tablet   potassium chloride SA 20 MEQ tablet Commonly known as:  K-DUR,KLOR-CON     TAKE these medications   albuterol 108 (90 Base) MCG/ACT inhaler Commonly known as:  PROVENTIL HFA;VENTOLIN HFA Inhale 2 puffs into the lungs every 6 (six) hours as needed for wheezing or shortness of breath. What changed:  when to take this   aspirin 81 MG chewable tablet Chew 1 tablet (81 mg total) by mouth daily. Replaces:  aspirin 325 MG tablet   carvedilol 12.5 MG tablet Commonly known as:  COREG Take 1 tablet (12.5 mg total) by mouth 2 (two) times daily with a meal.   chlordiazePOXIDE 5 MG capsule Commonly known as:  LIBRIUM Please dispense 18 pills - Take 1 pill three times a day for 3 days, then  Take 1 pill two times a day for 3 days, then Take 1 pill once a day for 3 days and stop. What changed:  medication strength  how much to take  how to take this  when to take this  reasons to take this  additional instructions   folic acid 1 MG tablet Commonly known as:  FOLVITE Take 1 tablet (1 mg total) by mouth daily.   hydrALAZINE 50 MG tablet Commonly known as:  APRESOLINE Take 1 tablet (50 mg total) by mouth every 8 (eight) hours.   multivitamin with minerals Tabs tablet Take 1 tablet by mouth daily.   nicotine 14 mg/24hr patch Commonly known as:  NICODERM CQ - dosed in mg/24 hours Place 1 patch (14 mg total) onto the skin daily as needed (nicotine cessation).   oxyCODONE 5 MG immediate release tablet Commonly known as:  Oxy IR/ROXICODONE Take 1-2 tablets (5-10 mg total) by mouth every 4 (four) hours as needed for moderate pain, severe pain or breakthrough pain.   thiamine 100 MG tablet Take 1 tablet (100 mg total) by mouth daily. Start taking on:  03/04/2017   valsartan-hydrochlorothiazide 160-25 MG tablet Commonly known as:  DIOVAN-HCT Take 1 tablet by mouth daily.        Contact information for follow-up providers    Marilynn Rail, MD. Schedule an appointment as soon as possible for a visit in 2 day(s).   Specialty:  Family Medicine       [facility] EMERGENCY DEPARTMENT Follow up.   Specialty:  Emergency Medicine Why:  If symptoms worsen Contact information: [address] [id number] [place] [address] [phone]       Yolonda Kida, MD. Schedule an appointment as soon as possible for a visit in 1 week(s).   Specialty:  Orthopedic Surgery Contact information: [address] [place] [address] [phone]  Contact information for after-discharge care    Destination    [facility] .   Specialty:  Skilled Nursing Facility Contact  information: [address] [place] [address] [phone]                  Major procedures and Radiology Reports - PLEASE review detailed and final reports thoroughly  -         Ct Abdomen Pelvis Wo Contrast  Result Date: 02/26/2017 CLINICAL DATA:  Pt states he fell yesterday and now has pain on right side of abdomen. Pt endorses n/v, denies diarrhea. HTN, IBS, diverticulitis EXAM: CT ABDOMEN AND PELVIS WITHOUT CONTRAST TECHNIQUE: Multidetector CT imaging of the abdomen and pelvis was performed following the standard protocol without IV contrast. COMPARISON:  CT of abdomen and pelvis 02/20/2017 FINDINGS: Lower chest: Interval development of small right pleural effusion. There is bibasilar atelectasis. Heart size is normal. Hepatobiliary: No focal liver abnormality is seen. No gallstones, gallbladder wall thickening, or biliary dilatation. Pancreas: Unremarkable. No pancreatic ductal dilatation or surrounding inflammatory changes. Spleen: Normal in size without focal abnormality. Adrenals/Urinary Tract: Adrenals are normal in appearance. Normal appearance of both kidneys. Contrast is identified in the collecting systems following prior contrast exam. Urinary bladder is normal in appearance, opacified with contrast following recent contrast exam. Stomach/Bowel: The stomach and small bowel loops are normal in appearance. The appendix is well seen and has a normal appearance. Colon contains moderate stool burden. No evidence for obstruction. Suture line identified in the rectosigmoid colon. Vascular/Lymphatic: There is atherosclerotic calcification of the abdominal aorta. No retroperitoneal or mesenteric adenopathy. Reproductive: Prostate is enlarged. Other: Within the region of the right flank, there is significant edema and subcutaneous stranding, new since the most recent CT exam. The hematoma measures 5.9 x 8.1 cm. There is no underlying fracture. Musculoskeletal: Mild  degenerative changes are seen in thoracic and lumbar spine. No acute fracture or subluxation.1 IMPRESSION: 1. Large right flank hematoma measuring 5.9 x 8.1 cm and associated significant flank edema. 2. Small right pleural effusion. 3. No evidence for acute fracture. 4. Contrast within the collecting systems and urinary bladder following recent intravenous contrast administration. 5.  Aortic atherosclerosis. 6. Prostatic enlargement. Electronically Signed   By: Norva Pavlov M.D.   On: 02/26/2017 19:09   Dg Chest 2 View  Result Date: 02/26/2017 CLINICAL DATA:  Syncopal episode with resultant fall down stairs this morning. Current smoker, alcohol abuse. EXAM: CHEST  2 VIEW COMPARISON:  Portable chest x-ray dated February 20, 2017 FINDINGS: The lungs are mildly hypoinflated which is accentuated by the lordotic positioning. There is no evidence of acute infiltrate or pulmonary contusion. There is no pneumothorax or pleural effusion. The heart and pulmonary vascularity and mediastinal structures are normal. The observed bony structures exhibit no definite acute abnormality. There is widening of the right [facility] joint which has appeared since the previous study. IMPRESSION: There is no acute cardiopulmonary abnormality. New widening of the right [facility] joint may be acute. Correlation with any symptoms over the right [facility] joint is needed. Electronically Signed   By: David  [place] M.D.   On: 02/26/2017 09:33   Dg Shoulder Right  Result Date: 02/27/2017 CLINICAL DATA:  Sharp pain.  Fall. EXAM: RIGHT SHOULDER - 2+ VIEW COMPARISON:  Chest x-ray 02/27/2017.  CT chest 02/26/2017. FINDINGS: Acromioclavicular separation cannot be excluded. No evidence of fracture or dislocation. Atelectatic changes right lung base again identified, follow-up its chest x-rays demonstrate clearing suggested. IMPRESSION:  1.  Right acromioclavicular separation cannot be excluded . 2. Right base atelectatic changes again noted. Follow-up chest x-rays to  demonstrate clearing suggested. Electronically Signed   By: Maisie Fus  Register   On: 02/27/2017 09:07   Ct Head Wo Contrast  Result Date: 02/26/2017 CLINICAL DATA:  Syncope with fall EXAM: CT HEAD WITHOUT CONTRAST CT CERVICAL SPINE WITHOUT CONTRAST TECHNIQUE: Multidetector CT imaging of the head and cervical spine was performed following the standard protocol without intravenous contrast. Multiplanar CT image reconstructions of the cervical spine were also generated. COMPARISON:  Head CT February 20, 2017 FINDINGS: CT HEAD FINDINGS Brain: Mild diffuse atrophy is stable. There is no intracranial mass, hemorrhage, extra-axial fluid collection, or midline shift. There is mild patchy small vessel disease in the centra semiovale bilaterally. There is no new gray-white compartment lesion. No acute infarct evident. Vascular: No demonstrable hyperdense vessel. There is slight calcification in each cavernous carotid artery. Next Skull: Bony calvarium appears intact. There is, however, an acute appearing nondisplaced fracture of the anterior right zygomatic arch. Sinuses/Orbits: There is mild mucosal thickening in several ethmoid air cells bilaterally. There is mucosal thickening in the inferior right maxillary antrum. Other paranasal sinuses which are visualized are clear. Orbits appear symmetric bilaterally. Other: Mastoid air cells are clear. CT CERVICAL SPINE FINDINGS Alignment: There is no spondylolisthesis. Skull base and vertebrae: Skullbase and craniocervical junction regions appear normal. There is no fracture in the cervical region. No blastic or lytic bone lesions. Fracture of the right zygomatic arch described in the head CT report is noted. There are also comminuted fractures of the posterior right second rib. Soft tissues and spinal canal: Prevertebral soft tissues and predental space regions are normal. No paraspinous lesions. There is no cord or canal hematoma evident. Disc levels: There is moderately severe  disc space narrowing at C4-5 and C6-7. There is moderate disc space narrowing at C5-6. There is calcification in the anterior ligament at C3-4, C4-5, and C5-6. There are anterior osteophytes at C3, C4, C5, and C6. There is facet hypertrophy with exit foraminal narrowing at C5-6 and C6-7 bilaterally. Question degrees of facet hypertrophy noted at several levels. No frank disc extrusion. Upper chest: Visualized lung regions are clear. No apical pneumothorax evident. Other: None IMPRESSION: CT head: Nondisplaced fracture anterior right zygomatic arch. No skull fracture evident. There is stable atrophy with mild periventricular small vessel disease. No intracranial mass, hemorrhage, or extra-axial fluid collection. There is mild ethmoid air cell disease bilaterally. There is slight carotid artery calcification distally. CT cervical spine: No cervical spine region fracture or spondylolisthesis. There is multilevel arthropathy. There are comminuted fractures of the posterior right second and third ribs as well as a fracture of the right zygomatic arch. There is multifocal cervical spine arthropathy. Electronically Signed   By: Bretta Bang III M.D.   On: 02/26/2017 09:27   Ct Head Wo Contrast  Result Date: 02/20/2017 CLINICAL DATA:  Multiple falls with head injury EXAM: CT HEAD WITHOUT CONTRAST TECHNIQUE: Contiguous axial images were obtained from the base of the skull through the vertex without intravenous contrast. COMPARISON:  None. FINDINGS: Brain: No definite acute territorial infarction, hemorrhage or focal mass lesion is visualized. Mild periventricular white matter hypodensity suspect secondary to small vessel ischemic changes. Mild cortical atrophy. Slight ventricular prominence since, likely related to the presence of atrophy. Vascular: Slightly dense appearing proximal left MCA, suspect that this is secondary to vessel tortuosity. Scattered calcifications at the carotid siphons. Skull: No fracture or  suspicious  bone lesion. Sinuses/Orbits: Minimal mucosal thickening in the maxillary and ethmoid sinuses. No acute orbital abnormality. Other: None IMPRESSION: No definite CT evidence for acute intracranial abnormality. Mild periventricular white matter small vessel ischemic changes. Electronically Signed   By: Jasmine Pang M.D.   On: 02/20/2017 16:30   Ct Angio Chest Pe W/cm &/or Wo Cm  Result Date: 02/26/2017 CLINICAL DATA:  Mid Chest pain; syncope with fall this morning EXAM: CT ANGIOGRAPHY CHEST WITH CONTRAST TECHNIQUE: Multidetector CT imaging of the chest was performed using the standard protocol during bolus administration of intravenous contrast. Multiplanar CT image reconstructions and MIPs were obtained to evaluate the vascular anatomy. CONTRAST:  100 cc Isovue 370 COMPARISON:  Chest x-ray 02/26/2017 FINDINGS: Cardiovascular: Heart size is normal. No imaged pericardial effusion or significant coronary artery calcifications. Pulmonary arteries are well opacified and there is no acute pulmonary embolus. There is minimal atherosclerotic calcification of the thoracic aorta. Mediastinum/Nodes: Normal appearance of the esophagus. No significant mediastinal, hilar, or axillary adenopathy. The visualized portion of the thyroid gland has a normal appearance. Lungs/Pleura: Small right pleural effusion. There is smooth septal thickening primarily involving the dependent aspects of the lungs. No suspicious pulmonary nodules. No frank consolidations. Upper Abdomen: Gallbladder is present. Musculoskeletal: Mild mid thoracic spondylosis. Review of the MIP images confirms the above findings. IMPRESSION: 1. Technically adequate exam showing no acute pulmonary embolus. 2. Mild changes of pulmonary edema with small right pleural effusion. 3. Mild thoracic atherosclerosis. 4. Mid thoracic spondylosis. Electronically Signed   By: Norva Pavlov M.D.   On: 02/26/2017 13:03   Ct Cervical Spine Wo Contrast  Result Date:  02/26/2017 CLINICAL DATA:  Syncope with fall EXAM: CT HEAD WITHOUT CONTRAST CT CERVICAL SPINE WITHOUT CONTRAST TECHNIQUE: Multidetector CT imaging of the head and cervical spine was performed following the standard protocol without intravenous contrast. Multiplanar CT image reconstructions of the cervical spine were also generated. COMPARISON:  Head CT February 20, 2017 FINDINGS: CT HEAD FINDINGS Brain: Mild diffuse atrophy is stable. There is no intracranial mass, hemorrhage, extra-axial fluid collection, or midline shift. There is mild patchy small vessel disease in the centra semiovale bilaterally. There is no new gray-white compartment lesion. No acute infarct evident. Vascular: No demonstrable hyperdense vessel. There is slight calcification in each cavernous carotid artery. Next Skull: Bony calvarium appears intact. There is, however, an acute appearing nondisplaced fracture of the anterior right zygomatic arch. Sinuses/Orbits: There is mild mucosal thickening in several ethmoid air cells bilaterally. There is mucosal thickening in the inferior right maxillary antrum. Other paranasal sinuses which are visualized are clear. Orbits appear symmetric bilaterally. Other: Mastoid air cells are clear. CT CERVICAL SPINE FINDINGS Alignment: There is no spondylolisthesis. Skull base and vertebrae: Skullbase and craniocervical junction regions appear normal. There is no fracture in the cervical region. No blastic or lytic bone lesions. Fracture of the right zygomatic arch described in the head CT report is noted. There are also comminuted fractures of the posterior right second rib. Soft tissues and spinal canal: Prevertebral soft tissues and predental space regions are normal. No paraspinous lesions. There is no cord or canal hematoma evident. Disc levels: There is moderately severe disc space narrowing at C4-5 and C6-7. There is moderate disc space narrowing at C5-6. There is calcification in the anterior ligament at C3-4,  C4-5, and C5-6. There are anterior osteophytes at C3, C4, C5, and C6. There is facet hypertrophy with exit foraminal narrowing at C5-6 and C6-7 bilaterally. Question degrees of facet hypertrophy  noted at several levels. No frank disc extrusion. Upper chest: Visualized lung regions are clear. No apical pneumothorax evident. Other: None IMPRESSION: CT head: Nondisplaced fracture anterior right zygomatic arch. No skull fracture evident. There is stable atrophy with mild periventricular small vessel disease. No intracranial mass, hemorrhage, or extra-axial fluid collection. There is mild ethmoid air cell disease bilaterally. There is slight carotid artery calcification distally. CT cervical spine: No cervical spine region fracture or spondylolisthesis. There is multilevel arthropathy. There are comminuted fractures of the posterior right second and third ribs as well as a fracture of the right zygomatic arch. There is multifocal cervical spine arthropathy. Electronically Signed   By: Bretta Bang III M.D.   On: 02/26/2017 09:27   Mr Brain Wo Contrast  Result Date: 02/26/2017 CLINICAL DATA:  Syncopal episode at home. LEFT chest pain. History of migraines, chronic alcohol abuse, hypertension. EXAM: MRI HEAD WITHOUT CONTRAST TECHNIQUE: Multiplanar, multiecho pulse sequences of the brain and surrounding structures were obtained without intravenous contrast. COMPARISON:  CT HEAD Feb 26, 2017 at 0906 hours FINDINGS: Severely limited FLAIR sequence due to artifact. BRAIN: No reduced diffusion to suggest acute ischemia. No susceptibility artifact to suggest hemorrhage. Moderate to severe ventriculomegaly on the basis of parenchymal brain volume loss as there is overall commensurate enlargement of cerebral sulci and cerebellar folia. No suspicious parenchymal signal, masses or mass effect. Patchy supratentorial white matter FLAIR T2 hyperintensities. No abnormal extra-axial fluid collections. LEFT inferior cerebellar  developmental venous anomaly, benign finding. VASCULAR: Normal major intracranial vascular flow voids present at skull base. SKULL AND UPPER CERVICAL SPINE: No abnormal sellar expansion. No suspicious calvarial bone marrow signal. Craniocervical junction maintained. SINUSES/ORBITS: The mastoid air-cells and included paranasal sinuses are well-aerated. The included ocular globes and orbital contents are non-suspicious. OTHER: None. IMPRESSION: No acute intracranial process. Moderate to severe parenchymal brain volume loss and mild chronic small vessel ischemic disease. Electronically Signed   By: Awilda Metro M.D.   On: 02/26/2017 18:34   Ct Abdomen Pelvis W Contrast  Result Date: 02/20/2017 CLINICAL DATA:  Initial evaluation for acute abdominal pain, fever, weakness, nausea. EXAM: CT ABDOMEN AND PELVIS WITH CONTRAST TECHNIQUE: Multidetector CT imaging of the abdomen and pelvis was performed using the standard protocol following bolus administration of intravenous contrast. CONTRAST:  ISOVUE-300 IOPAMIDOL (ISOVUE-300) INJECTION 61% COMPARISON:  Prior CT from 06/04/2016. FINDINGS: Lower chest: Mild bibasilar atelectatic changes. Visualized lung bases are otherwise clear. Hepatobiliary: Diffuse hypoattenuation liver consistent with steatosis. Liver otherwise unremarkable. Gallbladder somewhat prominent. Minimal hazy pericholecystic hazy stranding/edema. No internal cholelithiasis. No biliary dilatation. Pancreas: Pancreas within normal limits. Spleen: Spleen within normal limits. Adrenals/Urinary Tract: Adrenal glands are normal. Kidneys equal in size with symmetric enhancement. No nephrolithiasis, hydronephrosis, or focal enhancing renal mass. No hydroureter. Bladder fairly distended without acute abnormality. Stomach/Bowel: Stomach within normal limits. No evidence for bowel obstruction appendix within normal limits. Sequelae of prior partial colectomy with surgical anastomosis within the sigmoid colon  noted. No acute inflammatory changes seen about the bowels. Vascular/Lymphatic: Mild aorto bi-iliac atherosclerotic disease. No aneurysm. For opacification of the IMA proximally (series 3, image 47). Remaining branch vessels are well opacified and within normal limits. No pathologically enlarged intra-abdominal or pelvic lymph nodes. Reproductive: Prostate mildly enlarged measuring 5.2 cm in transverse diameter, similar to previous. Other: No free air or fluid. Musculoskeletal: No acute osseous abnormality. No worrisome lytic or blastic osseous lesions. Scattered multilevel degenerative spondylolysis and facet arthrosis noted within the visualized spine. IMPRESSION: 1. Mildly prominent  gallbladder with trace pericholecystic stranding/edema. Correlation with laboratory values for possible acute gallbladder pathology recommended. Additionally, further evaluation with dedicated right upper quadrant ultrasound could be performed for further evaluation as warranted. 2. Poor opacification of the proximal IMA. Findings of uncertain significance, and may be simply related to technique on this non angiographic examination. Correlation with physical exam and serum lactate recommended for potential signs of intestinal ischemia. No other secondary signs to suggest acute bowel ischemia identified on this exam. 3. Hepatic steatosis. 4. Mildly enlarged prostate. Prominent urinary bladder distension may reflect a degree of underlying outlet obstruction. Electronically Signed   By: Rise MuBenjamin  McClintock M.D.   On: 02/20/2017 19:16   Mr Shoulder Right Wo Contrast  Result Date: 02/28/2017 CLINICAL DATA:  Status post syncope and fall 02/26/2017 with a right shoulder injury and pain. Possible AC joint separation. Initial encounter. EXAM: MRI OF THE RIGHT SHOULDER WITHOUT CONTRAST TECHNIQUE: Multiplanar, multisequence MR imaging of the shoulder was performed. No intravenous contrast was administered. COMPARISON:  Plain films left  shoulder 02/27/2017. CT scan 02/26/2017. FINDINGS: Rotator cuff:  Intact. Muscles:  No atrophy or focal lesion. Biceps long head:  Intact. Acromioclavicular Joint: The AC joint is abnormally widened with edema and hemorrhage within and about it consistent AC joint separation. The coracoclavicular ligament is completely torn resulting in elevation of the distal clavicle relative to the acromion. Type 2 acromion. No subacromial/subdeltoid bursal fluid. Glenohumeral Joint: Negative. Labrum:  Intact. Bones:  No fracture. Other: None. IMPRESSION: The study is positive for a grade 3 AC joint separation. The rotator cuff and long head of biceps tendons are intact. Electronically Signed   By: Drusilla Kannerhomas  Dalessio M.D.   On: 02/28/2017 13:56   Ct T-spine No Charge  Result Date: 02/27/2017 CLINICAL DATA:  Fall yesterday.  Upper back pain. EXAM: CT THORACIC SPINE WITHOUT CONTRAST TECHNIQUE: Multidetector CT images of the thoracic were obtained using the standard protocol without intravenous contrast. COMPARISON:  None. FINDINGS: Alignment: Normal Vertebrae: No fracture Paraspinal and other soft tissues: No epidural or paraspinal soft tissue swelling/hematoma. Disc levels: The disc space narrowing and spurring in the mid and lower thoracic spine. IMPRESSION: No evidence of thoracic fracture.  Mild spondylosis. Electronically Signed   By: Charlett NoseKevin  Dover M.D.   On: 02/27/2017 10:43   Dg Chest Port 1 View  Result Date: 02/27/2017 CLINICAL DATA:  Followup for pulmonary edema/pleural effusion. Patient with cough and dyspnea. Patient fell yesterday. EXAM: PORTABLE CHEST 1 VIEW COMPARISON:  02/26/2017 FINDINGS: Basilar lung opacity, right greater left, has increased since the prior exam, consistent with atelectasis. Remainder of the lungs is clear. The pleural effusions evident on the previous day's CT is not resolved radiographically. No pneumothorax. Cardiac silhouette is normal in size. No mediastinal or hilar masses. IMPRESSION:  1. Mild increase in lung base opacity since the previous day's studies consistent with atelectasis. No convincing pneumonia or pulmonary edema. No pneumothorax. Electronically Signed   By: Amie Portlandavid  Ormond M.D.   On: 02/27/2017 07:46   Dg Chest Port 1 View  Result Date: 02/20/2017 CLINICAL DATA:  Weakness, malaise and falls for 1 week. EXAM: PORTABLE CHEST 1 VIEW COMPARISON:  09/13/2016 and prior radiographs FINDINGS: The cardiomediastinal silhouette is unremarkable. There is no evidence of focal airspace disease, pulmonary edema, suspicious pulmonary nodule/mass, pleural effusion, or pneumothorax. No acute bony abnormalities are identified. IMPRESSION: No active disease. Electronically Signed   By: Harmon PierJeffrey  Hu M.D.   On: 02/20/2017 16:56   Ct No Charge  Result  Date: 02/27/2017 CLINICAL DATA:  Syncope and fall 02/26/2017. Right shoulder pain. Initial encounter. EXAM: CT OF THE UPPER RIGHT EXTREMITY WITHOUT CONTRAST TECHNIQUE: Multidetector CT imaging of the upper right extremity was performed according to the standard protocol. COMPARISON:  None. FINDINGS: Bones/Joint/Cartilage Provided images are reformatted from chest CT and cervical spine CT performed yesterday. No fracture is identified. The acromioclavicular joint is abnormally widened at 1.1 cm and the distal clavicle appears elevated relative to the acromion. The humeral head is located. Ligaments Suboptimally assessed by CT. Muscles and Tendons Appear intact. Soft tissues Soft tissue swelling and contusion are seen about the [facility] joint. IMPRESSION: Negative for fracture. Abnormal appearance of the acromioclavicular joint is consistent with separation, at least grade 2. Mild elevation of the distal clavicle relative to the acromion is worrisome for grade 3 AC separation and coracoclavicular ligament tear. Ligaments cannot be adequately assessed with CT scan. Electronically Signed   By: Drusilla Kanner M.D.   On: 02/27/2017 12:16    Micro Results       No results found for this or any previous visit (from the past 240 hour(s)).  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 rate 18, height 5\' 9"  (1.753 m), weight 66.7 kg (147 lb), SpO2 100 %.   Intake/Output Summary (Last 24 hours) at 03/03/17 1441 Last data filed at 03/03/17 1155  Gross per 24 hour  Intake              740 ml  Output             2276 ml  Net            -1536 ml    Exam Awake Alert, Oriented x 3, No new F.N deficits, anxious affect [place].AT,PERRAL Supple Neck,No JVD, No cervical lymphadenopathy appriciated.  Symmetrical Chest wall movement, Good air movement bilaterally, CTAB RRR,No Gallops,Rubs or new Murmurs, No Parasternal Heave +ve B.Sounds, Abd Soft, Non tender, No organomegaly appriciated, No rebound -guarding or rigidity. No Cyanosis, Clubbing or edema, No new Rash , multiple old bruises on the R shoulder and chest wall, Painful range of motion in the right shoulder   Data Review   CBC w Diff:  Lab Results  Component Value Date   WBC 8.4 02/28/2017   HGB 9.7 (L) 02/28/2017   HCT 28.8 (L) 02/28/2017   PLT 351 02/28/2017   LYMPHOPCT 13 02/26/2017   MONOPCT 8 02/26/2017   EOSPCT 1 02/26/2017   BASOPCT 1 02/26/2017    CMP:  Lab Results  Component Value Date   NA 132 (L) 02/28/2017   K 4.3 02/28/2017   CL 98 (L) 02/28/2017   CO2 25 02/28/2017   BUN 7 02/28/2017   CREATININE 0.76 02/28/2017   CREATININE 0.96 09/13/2016   PROT 5.1 (L) 02/27/2017   ALBUMIN 2.8 (L) 02/27/2017   BILITOT 0.9 02/27/2017   ALKPHOS 44 02/27/2017   AST 37 02/27/2017   ALT 32 02/27/2017  .   Total Time in preparing paper work, data evaluation and todays exam - 35 minutes  Susa Raring M.D on 03/03/2017 at 2:41 PM  [facility]   Office   [phone]

## 2017-03-03 NOTE — Clinical Social Work Note (Addendum)
Patient has discharge orders for today. Patient has [employer] and will not be able to get insurance authorization today. Discussed case with Chiropodistassistant director of social work and 5-day LOG approved if patient is not ambulating more than 5-10 feet. Per staff and the patient, PT has worked with him today. Awaiting progress note. CSW presented patient with bed offers. He [place] to stay in [place]. Two bed offers: [facility] and [facility]. Neither will take a 5-day LOG and require authorization first. [facility] states it takes 1-2 days. Waiting to see how long it takes for [facility]. [facility] states they can usually get auth same day. If they do not get the auth, they cannot take a 5 day LOG.  Charlynn CourtSarah Alisa Stjames, CSW [phone]  12:33 pm Patient does not meet criteria for an LOG as he walked 50 feet min assist today in PT. Patient initially agreeable to returning home with HHPT. Patient later called CSW in the room and stated that he did not feel safe returning home since he has to walk up stairs to get into his apartment and did not feel that he could stay alone right now. Patient is agreeable to private pay for SNF, understanding that it costs a few hundred dollars per day. CSW notified admissions coordinator [facility] and she will go ahead and start auth. She will check the status around 3:00 and if we do not have it we can start discussing private pay. CSW spoke with Misty StanleyLisa at [facility] and she stated that clinicals were being uploaded now and she would start working on it.  Charlynn CourtSarah Kylii Ennis, CSW [phone]  1:06 pm Insurance authorization obtained: [id number]. CSW awaiting clearance from SNF to send him.  Charlynn CourtSarah Jahden Schara, CSW [phone]

## 2017-03-03 NOTE — Discharge Instructions (Signed)
Wear sling for comfort. Ok to remove for shower and dressing. No lifting with right arm.  Follow with Primary MD Marilynn RailSeltzer, Stephen, MD in 7 days   Get CBC, CMP, 2 view Chest X ray checked  by Primary MD or SNF MD in 5-7 days ( we routinely change or add medications that can affect your baseline labs and fluid status, therefore we recommend that you get the mentioned basic workup next visit with your PCP, your PCP may decide not to get them or add new tests based on their clinical decision)  Activity: As tolerated with Full fall precautions use walker/cane & assistance as needed  Disposition SNF  Diet:   Heart Healthy with feeding assistance and aspiration precautions.  For Heart failure patients - Check your Weight same time everyday, if you gain over 2 pounds, or you develop in leg swelling, experience more shortness of breath or chest pain, call your Primary MD immediately. Follow Cardiac Low Salt Diet and 1.5 lit/day fluid restriction.  On your next visit with your primary care physician please Get Medicines reviewed and adjusted.  Please request your Prim.MD to go over all Hospital Tests and Procedure/Radiological results at the follow up, please get all Hospital records sent to your Prim MD by signing hospital release before you go home.  If you experience worsening of your admission symptoms, develop shortness of breath, life threatening emergency, suicidal or homicidal thoughts you must seek medical attention immediately by calling 911 or calling your MD immediately  if symptoms less severe.  You Must read complete instructions/literature along with all the possible adverse reactions/side effects for all the Medicines you take and that have been prescribed to you. Take any new Medicines after you have completely understood and accpet all the possible adverse reactions/side effects.   Do not drive, operate heavy machinery, perform activities at heights, swimming or participation in water  activities or provide baby sitting services if your were admitted for syncope or siezures until you have seen by Primary MD or a Neurologist and advised to do so again.  Do not drive when taking Pain medications.    Do not take more than prescribed Pain, Sleep and Anxiety Medications  Special Instructions: If you have smoked or chewed Tobacco  in the last 2 yrs please stop smoking, stop any regular Alcohol  and or any Recreational drug use.  Wear Seat belts while driving.   Please note  You were cared for by a hospitalist during your hospital stay. If you have any questions about your discharge medications or the care you received while you were in the hospital after you are discharged, you can call the unit and asked to speak with the hospitalist on call if the hospitalist that took care of you is not available. Once you are discharged, your primary care physician will handle any further medical issues. Please note that NO REFILLS for any discharge medications will be authorized once you are discharged, as it is imperative that you return to your primary care physician (or establish a relationship with a primary care physician if you do not have one) for your aftercare needs so that they can reassess your need for medications and monitor your lab values.

## 2017-03-03 NOTE — Clinical Social Work Placement (Signed)
   CLINICAL SOCIAL WORK PLACEMENT  NOTE  Date:  03/03/2017  Patient Details  Name: Alvin Benton MRN: [id number] Date of Birth: 12/24/1950  Clinical Social Work is seeking post-discharge placement for this patient at the Skilled  Nursing Facility level of care (*CSW will initial, date and re-position this form in  chart as items are completed):  Yes   Patient/family provided with [place] Clinical Social Work Department's list of facilities offering this level of care within the geographic area requested by the patient (or if unable, by the patient's family).  Yes   Patient/family informed of their freedom to choose among providers that offer the needed level of care, that participate in Medicare, Medicaid or managed care program needed by the patient, have an available bed and are willing to accept the patient.  Yes   Patient/family informed of [facility]'s ownership interest in [facility] and [facility], as well as of the fact that they are under no obligation to receive care at these facilities.  PASRR submitted to EDS on 02/28/17     PASRR number received on 03/02/17     Existing PASRR number confirmed on       FL2 transmitted to all facilities in geographic area requested by pt/family on 02/28/17     FL2 transmitted to all facilities within larger geographic area on       Patient informed that his/her managed care company has contracts with or will negotiate with certain facilities, including the following:        Yes   Patient/family informed of bed offers received.  [facility]     Physician recommends and patient chooses bed at      [facility].  Patient to be transferred to facility by [facility]     Patient family notified on 03/03/17 of transfer.  Name of family member notified:  Betsey HolidayBob Dickerson     PHYSICIAN Please prepare prescriptions     Additional Comment:     _______________________________________________ Margarito LinerSarah C Neno Hohensee, LCSW 03/03/2017, 1:29 PM

## 2017-03-03 NOTE — Progress Notes (Signed)
Physical Therapy Treatment Patient Details Name: Alvin Benton MRN: [id number] DOB: 06-May-1951 Today's Date: 03/03/2017    History of Present Illness Pt is a 66 yo male admitted following a syncopal episode in a stairwell on 02/26/17. Fall is suspected to be related to ETOH abuse as pt was intoxicated when arrived at the hospital. Imaging has ruled out fractures in right shoulder, ribs and spine and no acute findings of brain. Ortho consulted and will get another CT of right shoulder with supsect injury. PMH significant for ETOH abuse, GERD, HTN, IBS.     PT Comments    Pt with anxiety this date however able to ambulate 56' with SPC and minA. Pt to benefit from chaplain visit. RN made aware.   Follow Up Recommendations  SNF;Supervision/Assistance - 24 hour     Equipment Recommendations  None recommended by PT    Recommendations for Other Services       Precautions / Restrictions Precautions Precautions: Fall Precaution Comments: panic attacks/high anxiety Required Braces or Orthoses: Sling Restrictions Weight Bearing Restrictions: No    Mobility  Bed Mobility Overal bed mobility: Needs Assistance Bed Mobility: Supine to Sit     Supine to sit: Min assist     General bed mobility comments: minA for verbal directional cues, trunk elevation. pt able to bring LEs off EOB  Transfers Overall transfer level: Needs assistance Equipment used: 1 person hand held assist Transfers: Sit to/from Stand Sit to Stand: Min assist         General transfer comment: minA to steady pt due to tremors and inability to use R UE due to sling  Ambulation/Gait Ambulation/Gait assistance: Min assist Ambulation Distance (Feet): 50 Feet Assistive device: Straight cane Gait Pattern/deviations: Trunk flexed;Shuffle;Decreased stride length;Narrow base of support Gait velocity: slow Gait velocity interpretation: Below normal speed for age/gender General Gait Details: max directional v/c's,  shuffled, unsteady gait pattern, pt with increased fear of falling forward, max directional v/c's to decrease anxiety   Stairs            Wheelchair Mobility    Modified Rankin (Stroke Patients Only)       Balance Overall balance assessment: Needs assistance Sitting-balance support: No upper extremity supported;Feet supported Sitting balance-Leahy Scale: Fair Sitting balance - Comments: pt able to sit and look through bag   Standing balance support: Bilateral upper extremity supported Standing balance-Leahy Scale: Poor                              Cognition Arousal/Alertness: Awake/alert Behavior During Therapy: Anxious Overall Cognitive Status: Impaired/Different from baseline                                 General Comments: pt very anxious. talked to RN about getting CSW and chaplain to visit patient      Exercises      General Comments General comments (skin integrity, edema, etc.): pt with noted bruising along R side of flank with large bump/hematoma.      Pertinent Vitals/Pain Pain Assessment: 0-10 Pain Score: 8  Pain Location: R shoulder, back, between the shoulder blades Pain Intervention(s): Monitored during session    Home Living                      Prior Function            PT Goals (  current goals can now be found in the care plan section) Acute Rehab PT Goals Patient Stated Goal: find out what they're doing to me Progress towards PT goals: Progressing toward goals    Frequency    Min 3X/week      PT Plan Current plan remains appropriate    Co-evaluation              AM-PAC PT "6 Clicks" Daily Activity  Outcome Measure  Difficulty turning over in bed (including adjusting bedclothes, sheets and blankets)?: A Lot Difficulty moving from lying on back to sitting on the side of the bed? : A Lot Difficulty sitting down on and standing up from a chair with arms (e.g., wheelchair, bedside  commode, etc,.)?: A Lot Help needed moving to and from a bed to chair (including a wheelchair)?: A Lot Help needed walking in hospital room?: A Lot Help needed climbing 3-5 steps with a railing? : A Lot 6 Click Score: 12    End of Session Equipment Utilized During Treatment: Gait belt Activity Tolerance: Patient limited by pain (and anxiety) Patient left: in chair;with call bell/phone within reach;with chair alarm set Nurse Communication: Mobility status PT Visit Diagnosis: Unsteadiness on feet (R26.81);Difficulty in walking, not elsewhere classified (R26.2);Pain Pain - Right/Left: Right Pain - part of body: Shoulder     Time: [id number] PT Time Calculation (min) (ACUTE ONLY): 34 min  Charges:  $Gait Training: 8-22 mins $Therapeutic Activity: 8-22 mins                    G Codes:      Lewis ShockAshly Shantell Belongia, PT, DPT Pager #: [phone] Office #: [phone]    Lacosta Hargan M Leathia Farnell 03/03/2017, 11:49 AM

## 2017-03-03 NOTE — Progress Notes (Signed)
Report called to [facility].  °

## 2017-05-20 ENCOUNTER — Emergency Department (HOSPITAL_COMMUNITY): Payer: Managed Care, Other (non HMO)

## 2017-05-20 ENCOUNTER — Encounter (HOSPITAL_COMMUNITY): Payer: Self-pay | Admitting: Emergency Medicine

## 2017-05-20 ENCOUNTER — Inpatient Hospital Stay (HOSPITAL_COMMUNITY): Payer: Managed Care, Other (non HMO)

## 2017-05-20 ENCOUNTER — Inpatient Hospital Stay (HOSPITAL_COMMUNITY)
Admission: EM | Admit: 2017-05-20 | Discharge: 2017-05-25 | DRG: 640 | Disposition: A | Discharge status: Home / Self Care | Payer: Managed Care, Other (non HMO) | Attending: Internal Medicine | Admitting: Internal Medicine

## 2017-05-20 DIAGNOSIS — E876 Hypokalemia: Secondary | ICD-10-CM | POA: Diagnosis present

## 2017-05-20 DIAGNOSIS — F1010 Alcohol abuse, uncomplicated: Secondary | ICD-10-CM | POA: Diagnosis present

## 2017-05-20 DIAGNOSIS — Z7982 Long term (current) use of aspirin: Secondary | ICD-10-CM

## 2017-05-20 DIAGNOSIS — K219 Gastro-esophageal reflux disease without esophagitis: Secondary | ICD-10-CM | POA: Diagnosis present

## 2017-05-20 DIAGNOSIS — F17210 Nicotine dependence, cigarettes, uncomplicated: Secondary | ICD-10-CM | POA: Diagnosis present

## 2017-05-20 DIAGNOSIS — F10239 Alcohol dependence with withdrawal, unspecified: Secondary | ICD-10-CM

## 2017-05-20 DIAGNOSIS — Z87440 Personal history of urinary (tract) infections: Secondary | ICD-10-CM | POA: Diagnosis not present

## 2017-05-20 DIAGNOSIS — J690 Pneumonitis due to inhalation of food and vomit: Secondary | ICD-10-CM | POA: Diagnosis present

## 2017-05-20 DIAGNOSIS — K589 Irritable bowel syndrome without diarrhea: Secondary | ICD-10-CM | POA: Diagnosis present

## 2017-05-20 DIAGNOSIS — R1110 Vomiting, unspecified: Secondary | ICD-10-CM | POA: Diagnosis not present

## 2017-05-20 DIAGNOSIS — E860 Dehydration: Secondary | ICD-10-CM | POA: Diagnosis present

## 2017-05-20 DIAGNOSIS — E871 Hypo-osmolality and hyponatremia: Secondary | ICD-10-CM | POA: Diagnosis present

## 2017-05-20 DIAGNOSIS — J189 Pneumonia, unspecified organism: Secondary | ICD-10-CM | POA: Diagnosis present

## 2017-05-20 DIAGNOSIS — I351 Nonrheumatic aortic (valve) insufficiency: Secondary | ICD-10-CM | POA: Diagnosis present

## 2017-05-20 DIAGNOSIS — I340 Nonrheumatic mitral (valve) insufficiency: Secondary | ICD-10-CM | POA: Diagnosis not present

## 2017-05-20 DIAGNOSIS — Z9889 Other specified postprocedural states: Secondary | ICD-10-CM

## 2017-05-20 DIAGNOSIS — Z823 Family history of stroke: Secondary | ICD-10-CM

## 2017-05-20 DIAGNOSIS — R011 Cardiac murmur, unspecified: Secondary | ICD-10-CM | POA: Diagnosis present

## 2017-05-20 DIAGNOSIS — Z79899 Other long term (current) drug therapy: Secondary | ICD-10-CM | POA: Diagnosis not present

## 2017-05-20 DIAGNOSIS — Y903 Blood alcohol level of 60-79 mg/100 ml: Secondary | ICD-10-CM | POA: Diagnosis present

## 2017-05-20 DIAGNOSIS — F17200 Nicotine dependence, unspecified, uncomplicated: Secondary | ICD-10-CM | POA: Diagnosis not present

## 2017-05-20 DIAGNOSIS — I10 Essential (primary) hypertension: Secondary | ICD-10-CM

## 2017-05-20 DIAGNOSIS — K5909 Other constipation: Secondary | ICD-10-CM | POA: Diagnosis not present

## 2017-05-20 DIAGNOSIS — J309 Allergic rhinitis, unspecified: Secondary | ICD-10-CM | POA: Diagnosis present

## 2017-05-20 DIAGNOSIS — F1021 Alcohol dependence, in remission: Secondary | ICD-10-CM | POA: Diagnosis present

## 2017-05-20 DIAGNOSIS — Z720 Tobacco use: Secondary | ICD-10-CM | POA: Diagnosis not present

## 2017-05-20 DIAGNOSIS — F411 Generalized anxiety disorder: Secondary | ICD-10-CM | POA: Diagnosis present

## 2017-05-20 LAB — CBC WITH DIFFERENTIAL/PLATELET
Basophils Absolute: 0 10*3/uL (ref 0.0–0.1)
Basophils Relative: 0 %
EOS ABS: 0 10*3/uL (ref 0.0–0.7)
Eosinophils Relative: 0 %
HEMATOCRIT: 40.5 % (ref 39.0–52.0)
HEMOGLOBIN: 15.8 g/dL (ref 13.0–17.0)
LYMPHS ABS: 0.9 10*3/uL (ref 0.7–4.0)
Lymphocytes Relative: 11 %
MCH: 32.9 pg (ref 26.0–34.0)
MCHC: 39 g/dL — ABNORMAL HIGH (ref 30.0–36.0)
MCV: 84.4 fL (ref 78.0–100.0)
MONOS PCT: 4 %
Monocytes Absolute: 0.3 10*3/uL (ref 0.1–1.0)
NEUTROS ABS: 7 10*3/uL (ref 1.7–7.7)
NEUTROS PCT: 85 %
Platelets: 221 10*3/uL (ref 150–400)
RBC: 4.8 MIL/uL (ref 4.22–5.81)
RDW: 12.7 % (ref 11.5–15.5)
WBC: 8.3 10*3/uL (ref 4.0–10.5)

## 2017-05-20 LAB — BASIC METABOLIC PANEL
Anion gap: 13 (ref 5–15)
BUN: 7 mg/dL (ref 6–20)
CALCIUM: 8.3 mg/dL — AB (ref 8.9–10.3)
CO2: 24 mmol/L (ref 22–32)
CREATININE: 0.75 mg/dL (ref 0.61–1.24)
Chloride: 82 mmol/L — ABNORMAL LOW (ref 101–111)
GFR calc Af Amer: >60 mL/min (ref >60)
GFR calc non Af Amer: >60 mL/min (ref >60)
GLUCOSE: 86 mg/dL (ref 65–99)
Potassium: 3.7 mmol/L (ref 3.5–5.1)
Sodium: 119 mmol/L — CL (ref 135–145)

## 2017-05-20 LAB — COMPREHENSIVE METABOLIC PANEL
ALK PHOS: 80 U/L (ref 38–126)
ALT: 29 U/L (ref 17–63)
AST: 68 U/L — AB (ref 15–41)
Albumin: 4.3 g/dL (ref 3.5–5.0)
Anion gap: 22 — ABNORMAL HIGH (ref 5–15)
BUN: 8 mg/dL (ref 6–20)
CALCIUM: 9 mg/dL (ref 8.9–10.3)
CHLORIDE: 73 mmol/L — AB (ref 101–111)
CO2: 20 mmol/L — AB (ref 22–32)
CREATININE: 0.74 mg/dL (ref 0.61–1.24)
GFR calc Af Amer: >60 mL/min (ref >60)
Glucose, Bld: 86 mg/dL (ref 65–99)
Potassium: 3.1 mmol/L — ABNORMAL LOW (ref 3.5–5.1)
SODIUM: 115 mmol/L — AB (ref 135–145)
Total Bilirubin: 1.4 mg/dL — ABNORMAL HIGH (ref 0.3–1.2)
Total Protein: 7.5 g/dL (ref 6.5–8.1)

## 2017-05-20 LAB — RAPID URINE DRUG SCREEN, HOSP PERFORMED
Amphetamines: NOT DETECTED
BARBITURATES: NOT DETECTED
Benzodiazepines: POSITIVE — AB
Cocaine: NOT DETECTED
Opiates: NOT DETECTED
Tetrahydrocannabinol: NOT DETECTED

## 2017-05-20 LAB — ETHANOL: Alcohol, Ethyl (B): 79 mg/dL — ABNORMAL HIGH (ref <5)

## 2017-05-20 LAB — TSH: TSH: 0.872 u[IU]/mL (ref 0.350–4.500)

## 2017-05-20 LAB — OSMOLALITY: Osmolality: 249 mOsm/kg — CL (ref 275–295)

## 2017-05-20 LAB — CREATININE, URINE, RANDOM: Creatinine, Urine: 45.18 mg/dL

## 2017-05-20 LAB — OSMOLALITY, URINE: OSMOLALITY UR: 203 mosm/kg — AB (ref 300–900)

## 2017-05-20 LAB — MAGNESIUM: Magnesium: 2.1 mg/dL (ref 1.7–2.4)

## 2017-05-20 LAB — SODIUM, URINE, RANDOM: SODIUM UR: 39 mmol/L

## 2017-05-20 MED ORDER — PANTOPRAZOLE SODIUM 40 MG PO TBEC: 40.0000 mg | DELAYED_RELEASE_TABLET | Freq: Every day | ORAL | Status: DC

## 2017-05-20 MED ORDER — SODIUM CHLORIDE 0.9 % IV BOLUS (SEPSIS)
1000.0000 mL | Freq: Once | INTRAVENOUS | Status: AC
  Administered 2017-05-20: 1000 mL via INTRAVENOUS

## 2017-05-20 MED ORDER — POLYETHYLENE GLYCOL 3350 17 G PO PACK: 17.0000 g | PACK | Freq: Every day | ORAL | Status: DC | PRN

## 2017-05-20 MED ORDER — MOMETASONE FURO-FORMOTEROL FUM 200-5 MCG/ACT IN AERO
2.0000 | INHALATION_SPRAY | Freq: Two times a day (BID) | RESPIRATORY_TRACT | Status: DC
  Administered 2017-05-20 – 2017-05-25 (×10): 2 via RESPIRATORY_TRACT
  Filled 2017-05-20: qty 8.8

## 2017-05-20 MED ORDER — SODIUM CHLORIDE 0.9% FLUSH
3.0000 mL | Freq: Two times a day (BID) | INTRAVENOUS | Status: DC
  Administered 2017-05-21 – 2017-05-24 (×7): 3 mL via INTRAVENOUS

## 2017-05-20 MED ORDER — NICOTINE 14 MG/24HR TD PT24: 14.0000 mg | MEDICATED_PATCH | Freq: Every day | TRANSDERMAL | Status: DC | PRN

## 2017-05-20 MED ORDER — PANTOPRAZOLE SODIUM 40 MG PO TBEC
40.0000 mg | DELAYED_RELEASE_TABLET | Freq: Every day | ORAL | Status: DC
  Administered 2017-05-20 – 2017-05-25 (×6): 40 mg via ORAL
  Filled 2017-05-20 (×6): qty 1

## 2017-05-20 MED ORDER — VITAMIN B-1 100 MG PO TABS: 100.0000 mg | ORAL_TABLET | Freq: Every day | ORAL | Status: DC

## 2017-05-20 MED ORDER — LORAZEPAM 2 MG/ML IJ SOLN
1.0000 mg | Freq: Once | INTRAMUSCULAR | Status: AC
  Administered 2017-05-20: 1 mg via INTRAVENOUS
  Filled 2017-05-20: qty 1

## 2017-05-20 MED ORDER — LORAZEPAM 2 MG/ML IJ SOLN
1.0000 mg | Freq: Four times a day (QID) | INTRAMUSCULAR | Status: AC | PRN
  Administered 2017-05-21 – 2017-05-23 (×5): 1 mg via INTRAVENOUS
  Filled 2017-05-20 (×5): qty 1

## 2017-05-20 MED ORDER — FOLIC ACID 1 MG PO TABS
1.0000 mg | ORAL_TABLET | Freq: Every day | ORAL | Status: DC
Start: 2017-05-21 — End: 2017-05-25
  Administered 2017-05-21 – 2017-05-24 (×4): 1 mg via ORAL
  Filled 2017-05-20 (×5): qty 1

## 2017-05-20 MED ORDER — TIOTROPIUM BROMIDE MONOHYDRATE 18 MCG IN CAPS
18.0000 ug | ORAL_CAPSULE | Freq: Every day | RESPIRATORY_TRACT | Status: DC
  Administered 2017-05-21 – 2017-05-25 (×5): 18 ug via RESPIRATORY_TRACT
  Filled 2017-05-20 (×2): qty 5

## 2017-05-20 MED ORDER — AMLODIPINE BESYLATE 5 MG PO TABS
5.0000 mg | ORAL_TABLET | Freq: Every day | ORAL | Status: DC
  Administered 2017-05-20 – 2017-05-24 (×5): 5 mg via ORAL
  Filled 2017-05-20 (×5): qty 1

## 2017-05-20 MED ORDER — LORAZEPAM 2 MG/ML IJ SOLN
0.0000 mg | Freq: Two times a day (BID) | INTRAMUSCULAR | Status: AC
  Administered 2017-05-22 – 2017-05-23 (×3): 2 mg via INTRAVENOUS
  Filled 2017-05-20 (×3): qty 1

## 2017-05-20 MED ORDER — POTASSIUM CHLORIDE CRYS ER 20 MEQ PO TBCR
40.0000 meq | EXTENDED_RELEASE_TABLET | ORAL | Status: AC
  Administered 2017-05-20 (×2): 40 meq via ORAL
  Filled 2017-05-20 (×3): qty 2

## 2017-05-20 MED ORDER — VITAMIN B-1 100 MG PO TABS
100.0000 mg | ORAL_TABLET | Freq: Every day | ORAL | Status: DC
  Administered 2017-05-22 – 2017-05-24 (×3): 100 mg via ORAL
  Filled 2017-05-20 (×5): qty 1

## 2017-05-20 MED ORDER — LORAZEPAM 2 MG/ML IJ SOLN
0.0000 mg | Freq: Four times a day (QID) | INTRAMUSCULAR | Status: AC
  Administered 2017-05-20 (×2): 1 mg via INTRAVENOUS
  Administered 2017-05-21: 2 mg via INTRAVENOUS
  Administered 2017-05-21: 1 mg via INTRAVENOUS
  Administered 2017-05-21 – 2017-05-22 (×3): 2 mg via INTRAVENOUS
  Filled 2017-05-20 (×7): qty 1

## 2017-05-20 MED ORDER — HYDRALAZINE HCL 50 MG PO TABS
50.0000 mg | ORAL_TABLET | Freq: Three times a day (TID) | ORAL | Status: DC
  Administered 2017-05-20 – 2017-05-25 (×15): 50 mg via ORAL
  Filled 2017-05-20 (×15): qty 1

## 2017-05-20 MED ORDER — SENNA 8.6 MG PO TABS
1.0000 | ORAL_TABLET | Freq: Two times a day (BID) | ORAL | Status: DC
  Administered 2017-05-20 – 2017-05-23 (×5): 8.6 mg via ORAL
  Filled 2017-05-20 (×6): qty 1

## 2017-05-20 MED ORDER — FLEET ENEMA 7-19 GM/118ML RE ENEM: 1.0000 | ENEMA | Freq: Once | RECTAL | Status: DC | PRN

## 2017-05-20 MED ORDER — ACETAMINOPHEN 325 MG PO TABS
650.0000 mg | ORAL_TABLET | Freq: Four times a day (QID) | ORAL | Status: DC | PRN
  Administered 2017-05-23: 650 mg via ORAL
  Filled 2017-05-20: qty 2

## 2017-05-20 MED ORDER — IPRATROPIUM BROMIDE 0.02 % IN SOLN: 0.5000 mg | RESPIRATORY_TRACT | Status: DC | PRN

## 2017-05-20 MED ORDER — ONDANSETRON HCL 4 MG/2ML IJ SOLN
4.0000 mg | Freq: Once | INTRAMUSCULAR | Status: AC
  Administered 2017-05-20: 4 mg via INTRAVENOUS
  Filled 2017-05-20: qty 2

## 2017-05-20 MED ORDER — THIAMINE HCL 100 MG/ML IJ SOLN
100.0000 mg | Freq: Every day | INTRAMUSCULAR | Status: DC
  Administered 2017-05-21: 100 mg via INTRAVENOUS
  Filled 2017-05-20: qty 2

## 2017-05-20 MED ORDER — ACETAMINOPHEN 650 MG RE SUPP: 650.0000 mg | Freq: Four times a day (QID) | RECTAL | Status: DC | PRN

## 2017-05-20 MED ORDER — TRAMADOL HCL 50 MG PO TABS: 50.0000 mg | ORAL_TABLET | Freq: Four times a day (QID) | ORAL | Status: DC | PRN

## 2017-05-20 MED ORDER — CARVEDILOL 12.5 MG PO TABS
12.5000 mg | ORAL_TABLET | Freq: Two times a day (BID) | ORAL | Status: DC
  Administered 2017-05-20 – 2017-05-25 (×10): 12.5 mg via ORAL
  Filled 2017-05-20 (×10): qty 1

## 2017-05-20 MED ORDER — THIAMINE HCL 100 MG/ML IJ SOLN
Freq: Once | INTRAVENOUS | Status: AC
  Administered 2017-05-20: 14:00:00 via INTRAVENOUS
  Filled 2017-05-20: qty 1000

## 2017-05-20 MED ORDER — LORAZEPAM 1 MG PO TABS: 1.0000 mg | ORAL_TABLET | Freq: Four times a day (QID) | ORAL | Status: AC | PRN

## 2017-05-20 MED ORDER — ADULT MULTIVITAMIN W/MINERALS CH
1.0000 | ORAL_TABLET | Freq: Every day | ORAL | Status: DC
  Administered 2017-05-21 – 2017-05-24 (×4): 1 via ORAL
  Filled 2017-05-20 (×5): qty 1

## 2017-05-20 MED ORDER — ONDANSETRON HCL 4 MG/2ML IJ SOLN: 4.0000 mg | Freq: Four times a day (QID) | INTRAMUSCULAR | Status: DC | PRN

## 2017-05-20 MED ORDER — ONDANSETRON HCL 4 MG PO TABS: 4.0000 mg | ORAL_TABLET | Freq: Four times a day (QID) | ORAL | Status: DC | PRN

## 2017-05-20 MED ORDER — ALBUTEROL SULFATE (2.5 MG/3ML) 0.083% IN NEBU: 2.5000 mg | INHALATION_SOLUTION | RESPIRATORY_TRACT | Status: DC | PRN

## 2017-05-20 MED ORDER — SORBITOL 70 % SOLN
30.0000 mL | Freq: Every day | Status: DC | PRN
  Filled 2017-05-20: qty 30

## 2017-05-20 MED ORDER — NICOTINE 21 MG/24HR TD PT24
21.0000 mg | MEDICATED_PATCH | Freq: Every day | TRANSDERMAL | Status: DC
  Administered 2017-05-20 – 2017-05-24 (×5): 21 mg via TRANSDERMAL
  Filled 2017-05-20 (×5): qty 1

## 2017-05-20 NOTE — Progress Notes (Signed)
CRITICAL VALUE ALERT  Critical Value:  Na 119  Date & Time Notied:  05/20/2017 1815  Provider Notified: Pixie CasinoKim, J  Orders Received/Actions taken: No orders at this time

## 2017-05-20 NOTE — ED Notes (Signed)
Patient has been approved for room 1230. The nurse will be Maralyn SagoSarah, [phone]. The nurse is sending a patient upstairs. Please call the nurse to see if she is ready for the 20 minute timer to begin. She needs to get this patient out first. Thank you.

## 2017-05-20 NOTE — ED Triage Notes (Signed)
Pt here for alcohol detox. Shaking, diaphoretic, vomiting. Pt usually drinks about 6-8 airplane bottles of rum per day. Last drink this at 0430 today.

## 2017-05-20 NOTE — ED Notes (Signed)
Patient given urinal to provide urinal sample

## 2017-05-20 NOTE — ED Notes (Signed)
Date and time results received: 05/20/17 11:06 AM   Test: Sodium Critical Value: 115  Name of Provider Notified: Bethann BerkshireJoseph Zammit, MD  Orders Received? Or Actions Taken?: awaiting orders from Bethann BerkshireJoseph Zammit, MD

## 2017-05-20 NOTE — H&P (Signed)
History and Physical    Alvin Benton ZOX:[id number] DOB: 27-Jun-1951 DOA: 05/20/2017  PCP: Marilynn Rail, MD  Patient coming from: Home  I have personally briefly reviewed patient's old medical records in [facility]  Chief Complaint: I'm tired of drinking.  HPI: Alvin Benton is a 66 y.o. male with medical history significant of ongoing alcohol abuse, gastroesophageal reflux disease, hypertension, IBS, tobacco abuse, hypertension who presents to the ED stating he is tired of being on alcoholic and is here for detox and wanting to go to a rehabilitation facility after hospitalization. Patient states his last drink was at 4:30 AM the morning of admission. Patient states he drinks 5-6 small airplane bottles of liquor daily. Patient also states he drinks a lot. Patient does endorse decreased oral intake for the past 3 days the nausea and emesis and inability to keep anything down. Patient denies any fever, no chills, no chest, no shortness of breath, no diarrhea, no constipation, no abdominal pain, no melena, no hematemesis, no hematochezia, no focal neurological deficits, no cough.  ED Course: Patient seen in the ED comprehensive metabolic profile done had a sodium of 115, potassium of 3.1, chloride of 73, bicarbonate of 20, AST of 16, ALT of 29 otherwise was within normal limits. CBC was unremarkable. Alcohol level was 79. UDS was positive for benzos. Abdominal films were not done. Patient was given a bolus of IV fluids.[facility] hospitalists were called to admit the patient for further evaluation and management.  Review of Systems: As per HPI otherwise 10 point review of systems negative.  Past Medical History:  Diagnosis Date  . Blood in stool   . Diverticulitis   . ETOH abuse   . GERD (gastroesophageal reflux disease)   . Heart murmur   . History of urinary tract infection   . Hypertension   . IBS (irritable bowel syndrome)   . Migraine    "none in a long time" (03/14/2016)  .  Nocturia   . Pneumonia    childhood  . Seasonal allergies   . Tobacco abuse     Past Surgical History:  Procedure Laterality Date  . COLONOSCOPY    . CYSTOSCOPY WITH STENT PLACEMENT Left 10/03/2016   Procedure: CYSTOSCOPY WITH  LEFT URETERAL CATHETER PLACEMENT;  Surgeon: Bjorn Pippin, MD;  Location: [facility];  Service: Urology;  Laterality: Left;  . PROCTOSCOPY  10/03/2016   Procedure: PROCTOSCOPY;  Surgeon: Romie Levee, MD;  Location: [facility];  Service: General;;  . TONSILLECTOMY       reports that he has been smoking Cigarettes.  He has a 40.00 pack-year smoking history. He has never used smokeless tobacco. He reports that he drinks about 1.8 oz of alcohol per week . He reports that he does not use drugs.  No Known Allergies  Family History  Problem Relation Age of Onset  . Stroke Mother   . Colon cancer Neg Hx   . Esophageal cancer Neg Hx   . Rectal cancer Neg Hx   . Stomach cancer Neg Hx    Mother deceased age 16 from multiple CVAs. Father deceased age 69 from possible MI per patient.  Prior to Admission medications   Medication Sig Start Date End Date Taking? Authorizing Provider  albuterol (PROVENTIL HFA;VENTOLIN HFA) 108 (90 BASE) MCG/ACT inhaler Inhale 2 puffs into the lungs every 6 (six) hours as needed for wheezing or shortness of breath. Patient taking differently: Inhale 2 puffs into the lungs every 4 (four) hours as needed for wheezing  or shortness of breath.  07/17/15  Yes Sheliah Hatch, MD  hydrochlorothiazide (HYDRODIURIL) 25 MG tablet Take 25 mg by mouth daily.   Yes [provider]  Multiple Vitamin (MULTIVITAMIN WITH MINERALS) TABS tablet Take 1 tablet by mouth daily. 05/09/16  Yes Rodolph Bong, MD  valsartan (DIOVAN) 160 MG tablet Take 160 mg by mouth daily.   Yes [provider]  valsartan-hydrochlorothiazide (DIOVAN-HCT) 160-25 MG tablet Take 1 tablet by mouth daily. 03/03/17  Yes Leroy Sea, MD  aspirin 81 MG chewable tablet  Chew 1 tablet (81 mg total) by mouth daily. Patient not taking: Reported on 05/20/2017 03/03/17   Leroy Sea, MD  carvedilol (COREG) 12.5 MG tablet Take 1 tablet (12.5 mg total) by mouth 2 (two) times daily with a meal. Patient not taking: Reported on 05/20/2017 03/03/17   Leroy Sea, MD  chlordiazePOXIDE (LIBRIUM) 5 MG capsule Please dispense 18 pills - Take 1 pill three times a day for 3 days, then Take 1 pill two times a day for 3 days, then Take 1 pill once a day for 3 days and stop. Patient not taking: Reported on 05/20/2017 03/03/17   Leroy Sea, MD  folic acid (FOLVITE) 1 MG tablet Take 1 tablet (1 mg total) by mouth daily. Patient not taking: Reported on 05/20/2017 03/03/17   Leroy Sea, MD  hydrALAZINE (APRESOLINE) 50 MG tablet Take 1 tablet (50 mg total) by mouth every 8 (eight) hours. Patient not taking: Reported on 05/20/2017 03/03/17   Leroy Sea, MD  nicotine (NICODERM CQ - DOSED IN MG/24 HOURS) 14 mg/24hr patch Place 1 patch (14 mg total) onto the skin daily as needed (nicotine cessation). Patient not taking: Reported on 05/20/2017 03/03/17   Leroy Sea, MD  oxyCODONE (OXY IR/ROXICODONE) 5 MG immediate release tablet Take 1-2 tablets (5-10 mg total) by mouth every 4 (four) hours as needed for moderate pain, severe pain or breakthrough pain. Patient not taking: Reported on 05/20/2017 03/02/17   Leroy Sea, MD  thiamine 100 MG tablet Take 1 tablet (100 mg total) by mouth daily. Patient not taking: Reported on 05/20/2017 03/04/17   Leroy Sea, MD    Physical Exam: Vitals:   05/20/17 0844 05/20/17 1004 05/20/17 1100 05/20/17 1308  BP: (!) 134/91 (!) 148/79 133/73 (!) 151/84  Pulse: 80 99 88 94  Resp:  16  16  Temp:      TempSrc:      SpO2:  98% 100% 99%    Constitutional: Sitting up in bed. Diffuse tremors. Vitals:   05/20/17 0844 05/20/17 1004 05/20/17 1100 05/20/17 1308  BP: (!) 134/91 (!) 148/79 133/73 (!) 151/84  Pulse: 80 99 88 94    Resp:  16  16  Temp:      TempSrc:      SpO2:  98% 100% 99%   Eyes: PERRLA, lids and conjunctivae normal ENMT: Mucous membranes are dry. Posterior pharynx clear of any exudate or lesions.Normal dentition.  Neck: normal, supple, no masses, no thyromegaly Respiratory: clear to auscultation bilaterally, no wheezing, no crackles. Fair air movement. Normal respiratory effort. No accessory muscle use.  Cardiovascular: Regular rate and rhythm, 3/6 systolic ejection murmur. no rubs / gallops. No extremity edema. 2+ pedal pulses. No carotid bruits.  Abdomen: no tenderness, no masses palpated. No hepatosplenomegaly. Bowel sounds positive.  Musculoskeletal: no clubbing / cyanosis. No joint deformity upper and lower extremities. Good ROM, no contractures. Normal muscle tone.  Skin: no rashes, lesions, ulcers. No induration Neurologic: CN 2-12 grossly intact. Sensation intact, DTR normal. Strength 5/5 in all 4.  Psychiatric: Normal judgment and insight. Alert and oriented x 3. Normal mood.   Labs on Admission: I have personally reviewed following labs and imaging studies  CBC:  Recent Labs Lab 05/20/17 1030  WBC 8.3  NEUTROABS 7.0  HGB 15.8  HCT 40.5  MCV 84.4  PLT 221   Basic Metabolic Panel:  Recent Labs Lab 05/20/17 1030  NA 115*  K 3.1*  CL 73*  CO2 20*  GLUCOSE 86  BUN 8  CREATININE 0.74  CALCIUM 9.0  MG 2.1   GFR: CrCl cannot be calculated (Unknown ideal weight.). Liver Function Tests:  Recent Labs Lab 05/20/17 1030  AST 68*  ALT 29  ALKPHOS 80  BILITOT 1.4*  PROT 7.5  ALBUMIN 4.3   No results for input(s): LIPASE, AMYLASE in the last 168 hours. No results for input(s): AMMONIA in the last 168 hours. Coagulation Profile: No results for input(s): INR, PROTIME in the last 168 hours. Cardiac Enzymes: No results for input(s): CKTOTAL, CKMB, CKMBINDEX, TROPONINI in the last 168 hours. BNP (last 3 results) No results for input(s): PROBNP in the last 8760  hours. HbA1C: No results for input(s): HGBA1C in the last 72 hours. CBG: No results for input(s): GLUCAP in the last 168 hours. Lipid Profile: No results for input(s): CHOL, HDL, LDLCALC, TRIG, CHOLHDL, LDLDIRECT in the last 72 hours. Thyroid Function Tests: No results for input(s): TSH, T4TOTAL, FREET4, T3FREE, THYROIDAB in the last 72 hours. Anemia Panel: No results for input(s): VITAMINB12, FOLATE, FERRITIN, TIBC, IRON, RETICCTPCT in the last 72 hours. Urine analysis:    Component Value Date/Time   COLORURINE YELLOW 02/20/2017 1903   APPEARANCEUR CLEAR 02/20/2017 1903   LABSPEC 1.009 02/20/2017 1903   PHURINE 5.0 02/20/2017 1903   GLUCOSEU NEGATIVE 02/20/2017 1903   HGBUR NEGATIVE 02/20/2017 1903   BILIRUBINUR NEGATIVE 02/20/2017 1903   KETONESUR NEGATIVE 02/20/2017 1903   PROTEINUR NEGATIVE 02/20/2017 1903   NITRITE NEGATIVE 02/20/2017 1903   LEUKOCYTESUR NEGATIVE 02/20/2017 1903    Radiological Exams on Admission: No results found.  EKG: Not done  Assessment/Plan Principal Problem:   Hyponatremia Active Problems:   Alcohol abuse   Tobacco use disorder   Allergic rhinitis   Chronic constipation   Anxiety state   Systolic murmur   Essential hypertension   Alcohol withdrawal (HCC)   Hypokalemia   Dehydration  #1 severe hyponatremia Likely secondary to hypovolemic hyponatremia in the setting of chronic alcohol abuse and thiazide diuretics. Will check orthostatics. Check a fractional excretion of sodium. Check a urine osmolality. Check a serum osmolality. Check a TSH. Will discontinue patient's thiazide diuretics I will not resumed on discharge. Hydrated with IV fluids. Repeat labs now and in the morning. Follow.  #2 alcohol withdrawal/alcohol abuse Patient states he is tired of drinking alcohol and is seeking help for alcohol detox and rehabilitation. Patient currently with diffuse tremors. Place patient in the step down unit on the Ativan withdrawal protocol and  monitor closely. Will give a banana bag IV 1. Place on folic acid, thiamine, multivitamin. Monitor closely. Social work consult to help with placement once patient is medically stable.  #3 tobacco use disorder Tobacco cessation. Nicotine patch.  #4 chronic constipation Will place on MiraLAX twice daily as well as Senokot S.  #5 hypertension Continue home regimen of Coreg and hydralazine. Will discontinue patient's HCTZ as likely contributing to patient's  severe hyponatremia. Add Norvasc 5 mg daily for better blood pressure control. If further blood pressure control is needed may need to resume patient's home dose Diovan.  #6 hypokalemia Replete.  #7 systolic murmur Patient states has had a history of systolic murmur since childhood. Will check a 2-D echo.   DVT prophylaxis:SCDs Code Status: Full Family Communication: Updated patient. No family at bedside. Disposition Plan: Likely to alcohol rehabilitation facility once medically stable. Consults called: None Admission status: Admit to inpatient.   [facility] Center - Hot Spring CountyHOMPSON,Illeana Edick MD [facility] Pager [phone]  If 7PM-7AM, please contact night-coverage [email or web address] Password [email or web address]  05/20/2017, 1:46 PM

## 2017-05-20 NOTE — Progress Notes (Signed)
CRITICAL VALUE ALERT  Critical Value:  Osmolality 249  Date & Time Notied:  05/20/17 1934  Provider Notified: Clearence PedSchorr, [facility] NP

## 2017-05-20 NOTE — ED Provider Notes (Signed)
[facility]-EMERGENCY DEPT Provider Note   CSN: [id number] Arrival date & time: 05/20/17  4098     History   Chief Complaint Chief Complaint  Patient presents with  . Alcohol Withdrawal    HPI Alvin Benton is a 66 y.o. male.  Patient complains of vomiting for 3 days. Patient has a long history of alcohol abuse   The history is provided by the patient. No language interpreter was used.  Emesis   This is a new problem. The current episode started more than 2 days ago. The problem occurs 2 to 4 times per day. The problem has not changed since onset.The emesis has an appearance of stomach contents. There has been no fever. Pertinent negatives include no abdominal pain, no chills, no cough, no diarrhea and no headaches.    Past Medical History:  Diagnosis Date  . Blood in stool   . Diverticulitis   . ETOH abuse   . GERD (gastroesophageal reflux disease)   . Heart murmur   . History of urinary tract infection   . Hypertension   . IBS (irritable bowel syndrome)   . Migraine    "none in a long time" (03/14/2016)  . Nocturia   . Pneumonia    childhood  . Seasonal allergies   . Tobacco abuse     Patient Active Problem List   Diagnosis Date Noted  . Syncope 02/26/2017  . History of atrial fibrillation 02/26/2017  . Right flank hematoma 02/26/2017  . Zygomatic fracture, right side, initial encounter for closed fracture (HCC) 02/26/2017  . Multiple rib fractures 02/26/2017  . Acute pulmonary edema (HCC)   . Dizziness   . Fall   . Dehydration with hyponatremia 02/20/2017  . Alcohol withdrawal (HCC) 06/04/2016  . Volume depletion 06/04/2016  . Hypokalemia 06/04/2016  . Diverticulitis of intestine with perforation without bleeding 06/04/2016  . Essential hypertension   . Hypomagnesemia   . Ileus (HCC) 05/05/2016  . Systolic murmur 05/05/2016  . Anxiety state 04/19/2016  . 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 Hypoxia 04/05/2016  . Atrial fibrillation (HCC) 04/05/2016  .  Diverticulitis of large intestine with perforation and abscess 03/14/2016  . Chronic constipation 03/08/2016  . Alcohol abuse 02/15/2016  . Tobacco use disorder 07/17/2015  . Allergic rhinitis 07/17/2015  . Reactive airway disease with wheezing 07/17/2015  . Overweight (BMI 25.0-29.9) 07/17/2015  . Hand pain, left 01/12/2014  . LLQ pain-chronic, intermittent and episodic 09/17/2013    Past Surgical History:  Procedure Laterality Date  . COLONOSCOPY    . CYSTOSCOPY WITH STENT PLACEMENT Left 10/03/2016   Procedure: CYSTOSCOPY WITH  LEFT URETERAL CATHETER PLACEMENT;  Surgeon: Bjorn Pippin, MD;  Location: [facility];  Service: Urology;  Laterality: Left;  . PROCTOSCOPY  10/03/2016   Procedure: PROCTOSCOPY;  Surgeon: Romie Levee, MD;  Location: [facility];  Service: General;;  . TONSILLECTOMY         Home Medications    Prior to Admission medications   Medication Sig Start Date End Date Taking? Authorizing Provider  albuterol (PROVENTIL HFA;VENTOLIN HFA) 108 (90 BASE) MCG/ACT inhaler Inhale 2 puffs into the lungs every 6 (six) hours as needed for wheezing or shortness of breath. Patient taking differently: Inhale 2 puffs into the lungs every 4 (four) hours as needed for wheezing or shortness of breath.  07/17/15  Yes Sheliah Hatch, MD  hydrochlorothiazide (HYDRODIURIL) 25 MG tablet Take 25 mg by mouth daily.   Yes [provider]  Multiple Vitamin (MULTIVITAMIN WITH MINERALS) TABS  tablet Take 1 tablet by mouth daily. 05/09/16  Yes Rodolph Bonghompson, Daniel V, MD  valsartan (DIOVAN) 160 MG tablet Take 160 mg by mouth daily.   Yes [provider]  valsartan-hydrochlorothiazide (DIOVAN-HCT) 160-25 MG tablet Take 1 tablet by mouth daily. 03/03/17  Yes Leroy SeaSingh, Prashant K, MD  aspirin 81 MG chewable tablet Chew 1 tablet (81 mg total) by mouth daily. Patient not taking: Reported on 05/20/2017 03/03/17   Leroy SeaSingh, Prashant K, MD  carvedilol (COREG) 12.5 MG tablet Take 1 tablet (12.5 mg total) by  mouth 2 (two) times daily with a meal. Patient not taking: Reported on 05/20/2017 03/03/17   Leroy SeaSingh, Prashant K, MD  chlordiazePOXIDE (LIBRIUM) 5 MG capsule Please dispense 18 pills - Take 1 pill three times a day for 3 days, then Take 1 pill two times a day for 3 days, then Take 1 pill once a day for 3 days and stop. Patient not taking: Reported on 05/20/2017 03/03/17   Leroy SeaSingh, Prashant K, MD  folic acid (FOLVITE) 1 MG tablet Take 1 tablet (1 mg total) by mouth daily. Patient not taking: Reported on 05/20/2017 03/03/17   Leroy SeaSingh, Prashant K, MD  hydrALAZINE (APRESOLINE) 50 MG tablet Take 1 tablet (50 mg total) by mouth every 8 (eight) hours. Patient not taking: Reported on 05/20/2017 03/03/17   Leroy SeaSingh, Prashant K, MD  nicotine (NICODERM CQ - DOSED IN MG/24 HOURS) 14 mg/24hr patch Place 1 patch (14 mg total) onto the skin daily as needed (nicotine cessation). Patient not taking: Reported on 05/20/2017 03/03/17   Leroy SeaSingh, Prashant K, MD  oxyCODONE (OXY IR/ROXICODONE) 5 MG immediate release tablet Take 1-2 tablets (5-10 mg total) by mouth every 4 (four) hours as needed for moderate pain, severe pain or breakthrough pain. Patient not taking: Reported on 05/20/2017 03/02/17   Leroy SeaSingh, Prashant K, MD  thiamine 100 MG tablet Take 1 tablet (100 mg total) by mouth daily. Patient not taking: Reported on 05/20/2017 03/04/17   Leroy SeaSingh, Prashant K, MD    Family History Family History  Problem Relation Age of Onset  . Stroke Mother   . Colon cancer Neg Hx   . Esophageal cancer Neg Hx   . Rectal cancer Neg Hx   . Stomach cancer Neg Hx     Social History Social History  Substance Use Topics  . Smoking status: Current Every Day Smoker    Packs/day: 1.00    Years: 40.00    Types: Cigarettes  . Smokeless tobacco: Never Used  . Alcohol use 1.8 oz/week    3 Shots of liquor per week     Comment: pt states does not drink daily has beer with football     Allergies   Patient has no known allergies.   Review of Systems Review  of Systems  Constitutional: Negative for appetite change, chills and fatigue.  HENT: Negative for congestion, ear discharge and sinus pressure.   Eyes: Negative for discharge.  Respiratory: Negative for cough.   Cardiovascular: Negative for chest pain.  Gastrointestinal: Positive for vomiting. Negative for abdominal pain and diarrhea.  Genitourinary: Negative for frequency and hematuria.  Musculoskeletal: Negative for back pain.  Skin: Negative for rash.  Neurological: Negative for seizures and headaches.  Psychiatric/Behavioral: Negative for hallucinations.     Physical Exam Updated Vital Signs BP 133/73   Pulse 88   Temp 98.2 F (36.8 C) (Oral)   Resp 16   SpO2 100%   Physical Exam  Constitutional: He is oriented to person, place, and  time. He appears well-developed.  HENT:  Head: Normocephalic.  Eyes: Conjunctivae and EOM are normal. No scleral icterus.  Neck: Neck supple. No thyromegaly present.  Cardiovascular: Normal rate and regular rhythm.  Exam reveals no gallop and no friction rub.   No murmur heard. Pulmonary/Chest: No stridor. He has no wheezes. He has no rales. He exhibits no tenderness.  Abdominal: He exhibits no distension. There is no tenderness. There is no rebound.  Musculoskeletal: Normal range of motion. He exhibits no edema.  Lymphadenopathy:    He has no cervical adenopathy.  Neurological: He is oriented to person, place, and time. He exhibits normal muscle tone. Coordination normal.  Skin: No rash noted. No erythema.  Psychiatric: He has a normal mood and affect. His behavior is normal.     ED Treatments / Results  Labs (all labs ordered are listed, but only abnormal results are displayed) Labs Reviewed  CBC WITH DIFFERENTIAL/PLATELET - Abnormal; Notable for the following:       Result Value   MCHC 39.0 (*)    All other components within normal limits  ETHANOL - Abnormal; Notable for the following:    Alcohol, Ethyl (B) 79 (*)    All other  components within normal limits  COMPREHENSIVE METABOLIC PANEL - Abnormal; Notable for the following:    Sodium 115 (*)    Potassium 3.1 (*)    Chloride 73 (*)    CO2 20 (*)    AST 68 (*)    Total Bilirubin 1.4 (*)    Anion gap 22 (*)    All other components within normal limits  RAPID URINE DRUG SCREEN, HOSP PERFORMED - Abnormal; Notable for the following:    Benzodiazepines POSITIVE (*)    All other components within normal limits  MAGNESIUM    EKG  EKG Interpretation None       Radiology No results found.  Procedures Procedures (including critical care time)  Medications Ordered in ED Medications  sodium chloride 0.9 % 1,000 mL with thiamine 100 mg, folic acid 1 mg, multivitamins adult 10 mL infusion (not administered)  LORazepam (ATIVAN) injection 1 mg (1 mg Intravenous Given 05/20/17 1027)  ondansetron (ZOFRAN) injection 4 mg (4 mg Intravenous Given 05/20/17 1027)  sodium chloride 0.9 % bolus 1,000 mL (0 mLs Intravenous Stopped 05/20/17 1200)  sodium chloride 0.9 % bolus 1,000 mL (1,000 mLs Intravenous New Bag/Given 05/20/17 1206)     Initial Impression / Assessment and Plan / ED Course  I have reviewed the triage vital signs and the nursing notes.  Pertinent labs & imaging results that were available during my care of the patient were reviewed by me and considered in my medical decision making (see chart for details).    CRITICAL CARE Performed by: Jonee Lamore L Total critical care time:35 minutes Critical care time was exclusive of separately billable procedures and treating other patients. Critical care was necessary to treat or prevent imminent or life-threatening deterioration. Critical care was time spent personally by me on the following activities: development of treatment plan with patient and/or surrogate as well as nursing, discussions with consultants, evaluation of patient's response to treatment, examination of patient, obtaining history from patient  or surrogate, ordering and performing treatments and interventions, ordering and review of laboratory studies, ordering and review of radiographic studies, pulse oximetry and re-evaluation of patient's condition.   Patient with hyponatremia. He will be admitted for treatment  Final Clinical Impressions(s) / ED Diagnoses   Final diagnoses:  Hyponatremia  New Prescriptions New Prescriptions   No medications on file     Bethann Berkshire, MD 05/20/17 1220

## 2017-05-20 NOTE — ED Notes (Signed)
Recent departure condition charted on the wrong patient.

## 2017-05-21 ENCOUNTER — Encounter (HOSPITAL_COMMUNITY): Payer: Self-pay

## 2017-05-21 ENCOUNTER — Inpatient Hospital Stay (HOSPITAL_COMMUNITY): Payer: Managed Care, Other (non HMO)

## 2017-05-21 DIAGNOSIS — R011 Cardiac murmur, unspecified: Secondary | ICD-10-CM

## 2017-05-21 DIAGNOSIS — F1010 Alcohol abuse, uncomplicated: Secondary | ICD-10-CM

## 2017-05-21 DIAGNOSIS — I340 Nonrheumatic mitral (valve) insufficiency: Secondary | ICD-10-CM

## 2017-05-21 DIAGNOSIS — J189 Pneumonia, unspecified organism: Secondary | ICD-10-CM | POA: Diagnosis present

## 2017-05-21 DIAGNOSIS — J690 Pneumonitis due to inhalation of food and vomit: Secondary | ICD-10-CM

## 2017-05-21 LAB — ECHOCARDIOGRAM COMPLETE
HEIGHTINCHES: 69 in
WEIGHTICAEL: 2828.94 [oz_av]

## 2017-05-21 LAB — COMPREHENSIVE METABOLIC PANEL
ALBUMIN: 3.3 g/dL — AB (ref 3.5–5.0)
ALT: 22 U/L (ref 17–63)
AST: 54 U/L — AB (ref 15–41)
Alkaline Phosphatase: 60 U/L (ref 38–126)
Anion gap: 8 (ref 5–15)
BUN: 9 mg/dL (ref 6–20)
CHLORIDE: 96 mmol/L — AB (ref 101–111)
CO2: 24 mmol/L (ref 22–32)
CREATININE: 0.68 mg/dL (ref 0.61–1.24)
Calcium: 8.4 mg/dL — ABNORMAL LOW (ref 8.9–10.3)
GFR calc Af Amer: >60 mL/min (ref >60)
GFR calc non Af Amer: >60 mL/min (ref >60)
Glucose, Bld: 81 mg/dL (ref 65–99)
POTASSIUM: 4.1 mmol/L (ref 3.5–5.1)
SODIUM: 128 mmol/L — AB (ref 135–145)
Total Bilirubin: 1.4 mg/dL — ABNORMAL HIGH (ref 0.3–1.2)
Total Protein: 6.1 g/dL — ABNORMAL LOW (ref 6.5–8.1)

## 2017-05-21 LAB — CBC
HCT: 41 % (ref 39.0–52.0)
Hemoglobin: 14.8 g/dL (ref 13.0–17.0)
MCH: 31.6 pg (ref 26.0–34.0)
MCHC: 36.1 g/dL — ABNORMAL HIGH (ref 30.0–36.0)
MCV: 87.6 fL (ref 78.0–100.0)
PLATELETS: 139 10*3/uL — AB (ref 150–400)
RBC: 4.68 MIL/uL (ref 4.22–5.81)
RDW: 13.2 % (ref 11.5–15.5)
WBC: 5.7 10*3/uL (ref 4.0–10.5)

## 2017-05-21 LAB — GLUCOSE, CAPILLARY: Glucose-Capillary: 87 mg/dL (ref 65–99)

## 2017-05-21 MED ORDER — LIP MEDEX EX OINT
TOPICAL_OINTMENT | CUTANEOUS | Status: AC
  Administered 2017-05-21: 1
  Filled 2017-05-21: qty 7

## 2017-05-21 MED ORDER — SODIUM CHLORIDE 0.9 % IV SOLN
INTRAVENOUS | Status: DC
  Administered 2017-05-21 – 2017-05-22 (×2): via INTRAVENOUS

## 2017-05-21 MED ORDER — LIP MEDEX EX OINT: TOPICAL_OINTMENT | CUTANEOUS | Status: DC | PRN

## 2017-05-21 NOTE — Evaluation (Signed)
Physical Therapy Evaluation Patient Details Name: Alvin Benton MRN: [id number] DOB: 05/25/1951 Today's Date: 05/21/2017   History of Present Illness  Alvin Benton is a 66 y.o. male with medical history significant of ongoing alcohol abuse, gastroesophageal reflux disease, hypertension, IBS, tobacco abuse, hypertension who presents to the ED 05/20/17  stating he is tired of being on alcoholic and is here for detox and wanting to go to a rehabilitation facility after hospitalization  Clinical Impression  The patient  Presents with extremity shaking with mobility and requires assistance of 1 to mobilize to recliner. Unsteady standing. Very sluggish. Currently will require 2 assist to safely ambulate.  Pt admitted with above diagnosis. Pt currently with functional limitations due to the deficits listed below (see PT Problem List).  Pt will benefit from skilled PT to increase their independence and safety with mobility to allow discharge to the venue listed below.       Follow Up Recommendations Other (comment) (if able, may DC to facillity for alcohol dependency.)    Equipment Recommendations  None recommended by PT    Recommendations for Other Services OT consult     Precautions / Restrictions Precautions Precautions: Fall Precaution Comments: incontinent BM Restrictions Weight Bearing Restrictions: No      Mobility  Bed Mobility Overal bed mobility: Needs Assistance Bed Mobility: Supine to Sit     Supine to sit: Mod assist     General bed mobility comments: self assisting legs, assistance for trunk to sitting upright and scoot to bed edge.  Transfers Overall transfer level: Needs assistance Equipment used: Rolling walker (2 wheeled) Transfers: Sit to/from UGI CorporationStand;Stand Pivot Transfers Sit to Stand: Mod assist Stand pivot transfers: Mod assist       General transfer comment: steady assist to stand from bed at RW. shuffle steps to recliner, noted to be  tremulous.  Ambulation/Gait                Stairs            Wheelchair Mobility    Modified Rankin (Stroke Patients Only)       Balance Overall balance assessment: Needs assistance Sitting-balance support: Feet supported;Bilateral upper extremity supported Sitting balance-Leahy Scale: Fair     Standing balance support: During functional activity;Bilateral upper extremity supported Standing balance-Leahy Scale: Poor                               Pertinent Vitals/Pain Pain Assessment: No/denies pain    Home Living Family/patient expects to be discharged to:: Private residence Living Arrangements: Alone Available Help at Discharge: Friend(s) Type of Home: Apartment Home Access: Stairs to enter Entrance Stairs-Rails: Right Entrance Stairs-Number of Steps: 12 Home Layout: One level Home Equipment: Cane - single point      Prior Function Level of Independence: Independent               Hand Dominance        Extremity/Trunk Assessment   Upper Extremity Assessment Upper Extremity Assessment: RUE deficits/detail;LUE deficits/detail RUE Deficits / Details: noted tremors both hands    Lower Extremity Assessment Lower Extremity Assessment: LLE deficits/detail;RLE deficits/detail RLE Deficits / Details: legs shaking with efforts to move LLE Deficits / Details: same as right       Communication   Communication: Other (comment) (speach is somewhat garbled.)  Cognition Arousal/Alertness: Lethargic;Suspect due to medications Behavior During Therapy: Impulsive Overall Cognitive Status: Impaired/Different from baseline Area of Impairment: Safety/judgement  Safety/Judgement: Decreased awareness of safety     General Comments: multimodal cue for safety, not to get up .oriented to time and place.      General Comments      Exercises     Assessment/Plan    PT Assessment Patient needs continued  PT services  PT Problem List Decreased strength;Decreased range of motion;Decreased activity tolerance;Decreased balance;Decreased mobility       PT Treatment Interventions DME instruction;Gait training;Stair training;Functional mobility training;Therapeutic activities;Balance training;Patient/family education    PT Goals (Current goals can be found in the Care Plan section)  Acute Rehab PT Goals Patient Stated Goal: agreed with getting OOB PT Goal Formulation: With patient Time For Goal Achievement: 06/04/17    Frequency Min 3X/week   Barriers to discharge Decreased caregiver support      Co-evaluation               AM-PAC PT "6 Clicks" Daily Activity  Outcome Measure Difficulty turning over in bed (including adjusting bedclothes, sheets and blankets)?: Total Difficulty moving from lying on back to sitting on the side of the bed? : Total Difficulty sitting down on and standing up from a chair with arms (e.g., wheelchair, bedside commode, etc,.)?: Total Help needed moving to and from a bed to chair (including a wheelchair)?: Total Help needed walking in hospital room?: Total Help needed climbing 3-5 steps with a railing? : Total 6 Click Score: 6    End of Session Equipment Utilized During Treatment: Gait belt Activity Tolerance: Patient tolerated treatment well Patient left: in chair;with call bell/phone within reach;with chair alarm set Nurse Communication: Mobility status PT Visit Diagnosis: Unsteadiness on feet (R26.81);Ataxic gait (R26.0);Other abnormalities of gait and mobility (R26.89)    Time: [id number] PT Time Calculation (min) (ACUTE ONLY): 28 min   Charges:   PT Evaluation $PT Eval Low Complexity: 1 Procedure PT Treatments $Therapeutic Activity: 8-22 mins   PT G CodesBlanchard Kelch:       Ensley Blas PT [phone]   Rada HayHill, Teagon Kron Elizabeth 05/21/2017, 11:24 AM

## 2017-05-21 NOTE — Evaluation (Signed)
Occupational Therapy Evaluation Patient Details Name: Alvin Benton MRN: [id number] DOB: Apr 13, 1951 Today's Date: 05/21/2017    History of Present Illness Alvin Benton is a 66 y.o. male with medical history significant of ongoing alcohol abuse, gastroesophageal reflux disease, hypertension, IBS, tobacco abuse, hypertension who presents to the ED 05/20/17  stating he is tired of being on alcoholic and is here for detox and wanting to go to a rehabilitation facility after hospitalization   Clinical Impression   Pt was admitted for the above.  He lives alone and is independent with adls/iadls at baseline.  Pt currently needs min to mod A for adls and toilet transfers. He will benefit from continued OT to increase safety and independence. Goals are for supervision level    Follow Up Recommendations  Supervision/Assistance - 24 hour (? substance abuse placement)    Equipment Recommendations   (to be further assessed)    Recommendations for Other Services       Precautions / Restrictions Precautions Precautions: Fall Restrictions Weight Bearing Restrictions: No      Mobility Bed Mobility         Supine to sit: Min assist     General bed mobility comments: min guard for sit to supine--watched catheter  Transfers   Equipment used: Rolling walker (2 wheeled)   Sit to Stand: Min assist;From elevated surface         General transfer comment: assist to stand and stabilize    Balance     Sitting balance-Leahy Scale: Fair       Standing balance-Leahy Scale: Poor                             ADL either performed or assessed with clinical judgement   ADL Overall ADL's : Needs assistance/impaired Eating/Feeding: Set up;Sitting (cup of water)   Grooming: Oral care;Set up;Sitting   Upper Body Bathing: Set up;Sitting   Lower Body Bathing: Minimal assistance;Sit to/from stand   Upper Body Dressing : Minimal assistance;Sitting   Lower Body Dressing: Minimal  assistance;Sit to/from stand   Toilet Transfer: Ambulation;Moderate assistance (back to bed)             General ADL Comments: pt took a few steps in room towards door with RW.  Unsteady and min to mod A given for balance. Cues to step into RW.  He has not used one in the past     Vision         Perception     Praxis      Pertinent Vitals/Pain Pain Assessment: No/denies pain     Hand Dominance     Extremity/Trunk Assessment Upper Extremity Assessment Upper Extremity Assessment: Generalized weakness (bil tremors)           Communication Communication Communication: No difficulties   Cognition Arousal/Alertness: Awake/alert Behavior During Therapy: Impulsive Overall Cognitive Status: Impaired/Different from baseline                           Safety/Judgement: Decreased awareness of safety     General Comments: cues for safety   General Comments       Exercises     Shoulder Instructions      Home Living Family/patient expects to be discharged to:: Private residence Living Arrangements: Alone                     Bathroom Toilet: Standard  Home Equipment: Cane - single point          Prior Functioning/Environment Level of Independence: Independent;Independent with assistive device(s)        Comments: uses a cane when he feels unsteady        OT Problem List: Decreased strength;Decreased activity tolerance;Decreased coordination;Impaired balance (sitting and/or standing);Decreased safety awareness;Decreased knowledge of use of DME or AE      OT Treatment/Interventions: Self-care/ADL training;DME and/or AE instruction;Patient/family education;Balance training;Therapeutic activities    OT Goals(Current goals can be found in the care plan section) Acute Rehab OT Goals Patient Stated Goal: return to independence OT Goal Formulation: With patient Time For Goal Achievement: 05/28/17 Potential to Achieve Goals: Good ADL  Goals Pt Will Transfer to Toilet: with min guard assist;bedside commode;ambulating Additional ADL Goal #1: pt will perform adl with set up/supervision sit to stand Additional ADL Goal #2: pt will perform bed mobility at supervision level in preparation for adls  OT Frequency: Min 2X/week   Barriers to D/C:            Co-evaluation              AM-PAC PT "6 Clicks" Daily Activity     Outcome Measure Help from another person eating meals?: A Little Help from another person taking care of personal grooming?: A Little Help from another person toileting, which includes using toliet, bedpan, or urinal?: A Lot Help from another person bathing (including washing, rinsing, drying)?: A Little Help from another person to put on and taking off regular upper body clothing?: A Little Help from another person to put on and taking off regular lower body clothing?: A Little 6 Click Score: 17   End of Session    Activity Tolerance: Patient tolerated treatment well Patient left: in bed;with call bell/phone within reach;with bed alarm set  OT Visit Diagnosis: Unsteadiness on feet (R26.81)                Time: [id number] OT Time Calculation (min): 20 min Charges:  OT General Charges $OT Visit: 1 Procedure OT Evaluation $OT Eval Low Complexity: 1 Procedure G-Codes:     [place] Waterway EstatesMaryellen Seichi Benton, OTR/L [phone] 05/21/2017  Alvin Benton 05/21/2017, 3:43 PM

## 2017-05-21 NOTE — Progress Notes (Signed)
PROGRESS NOTE    Alvin Benton  JXB:[id number] DOB: 09/28/1951 DOA: 05/20/2017 PCP: Marilynn RailSeltzer, Stephen, MD   Brief Narrative:  Patient is a 66 year old gentleman history of ongoing alcohol abuse, gastroesophageal reflux disease, hypertension, IBS, tobacco abuse, hypertension presented to the ED for alcohol detox and wanted to go to rehabilitation facility. Patient noted on admission to have a significant hyponatremia with a sodium of 115, hypokalemia with a potassium of 3.1, alcohol level of 79. Acute abdominal series done worrisome for right basilar infiltrates.   Assessment & Plan:   Principal Problem:   Hyponatremia Active Problems:   Alcohol abuse   Tobacco use disorder   Allergic rhinitis   Chronic constipation   Anxiety state   Systolic murmur   Essential hypertension   Alcohol withdrawal (HCC)   Hypokalemia   Dehydration   PNA (pneumonia): pROBABLE ASPIRATION  #1 severe hyponatremia Likely secondary to hypovolemic hyponatremia in the setting of chronic alcohol abuse and thiazide diuretics. Sodium levels improving and currently at 128 from 115 on admission. Serum osmolality was low at 249. Urine osmolality was low at 203. Urine sodium was 39. Urine creatinine was 45.18. TSH within normal limits at 0.872. Continue hydration with IV fluids. Will not resume patient's thiazide diuretics on discharge. Follow.  #2 alcohol withdrawal/alcohol abuse Patient states he is tired of drinking alcohol and is seeking help for alcohol detox and rehabilitation. Patient with improvement with diffuse tremors. Continue the Ativan withdrawal protocol. Continue folic acid, vitamin, thiamine. Patient status post banana bag IV 1. Social work consult to help with placement once patient is medically stable.  #3 tobacco use disorder Tobacco cessation. Nicotine patch. Continue Spiriva and dulera  #4 chronic constipation Continue twice daily Senokot-S.  #5 hypertension Continue home regimen of  Coreg and hydralazine and Norvasc. Discontinue patient's HCTZ as likely contributing to patient's severe hyponatremia. Continue current regimen of Coreg and hydralazine and may adjust hydralazine as needed for better blood pressure control.  #6 hypokalemia Repleted.  #7 systolic murmur/moderate aortic regurgitation or 2-D echo 5 2018 Patient states has had a history of systolic murmur since childhood. Patient would recent 2-D echo done May 2018 showing moderate aortic regurgitation with a EF of 65-70%, mild LVH, grade 1 diastolic dysfunction. Will DC 2-D echo which was ordered. Outpatient follow-up.   #8 constipation/? Ileus Per acute abdominal series. Patient with 2 moderate-sized bowel movements yesterday. Patient denies any abdominal pain. Patient tolerating current diet. Continue bowel regimen of Senokot S twice daily.  #9 probable aspiration pneumonia Per chest x-ray. Patient with a history of alcohol abuse and episodes of emesis prior to admission. Chest x-ray with right basilar infiltrates. Patient is afebrile with a normal white count. Patient however with a cough which is nonproductive. Place on a flutter valve. Treat empirically with Augmentin for 5-7 days.     DVT prophylaxis: SCDs Code Status: Full Family Communication: Updated patient. No family at bedside. Disposition Plan: Transfer to telemetry. Likely to rehabilitation facility once medically stable.   Consultants:   None  Procedures:   Acute abdominal series 05/20/2017  Antimicrobials:   Augmentin 05/21/2017   Subjective: Patient sitting up in chair eating breakfast. Patient denies any chest pain. No shortness of breath. Patient states some improvement with tremor since admission. Patient stated had 2 bowel movements yesterday. Patient denies any abdominal pain.  Objective: Vitals:   05/21/17 0800 05/21/17 0804 05/21/17 0948 05/21/17 1000  BP: 100/79  132/74 (!) 157/72  Pulse: 66   98  Resp:  20   (!)  21  Temp:  98.5 F (36.9 C)    TempSrc:  Oral    SpO2: 99% 97%  96%  Weight:      Height:        Intake/Output Summary (Last 24 hours) at 05/21/17 1026 Last data filed at 05/21/17 1000  Gross per 24 hour  Intake           717.92 ml  Output             4000 ml  Net         -3282.08 ml   Filed Weights   05/20/17 2000 05/21/17 0344  Weight: 77 kg (169 lb 12.1 oz) 80.2 kg (176 lb 12.9 oz)    Examination:  General exam: Appears calm and comfortable. Minimal tremors. Respiratory system: Some decreased BS in bases. No wheezing. Respiratory effort normal. Cardiovascular system: S1 & S2 heard, RRR. 3/6 SEM. No JVD, rubs, gallops or clicks. No pedal edema. Gastrointestinal system: Abdomen is nondistended, soft and nontender. No organomegaly or masses felt. Normal bowel sounds heard. Central nervous system: Alert and oriented. No focal neurological deficits. Extremities: Symmetric 5 x 5 power. Skin: No rashes, lesions or ulcers Psychiatry: Judgement and insight appear normal. Mood & affect appropriate.     Data Reviewed: I have personally reviewed following labs and imaging studies  CBC:  Recent Labs Lab 05/20/17 1030 05/21/17 0253  WBC 8.3 5.7  NEUTROABS 7.0  --   HGB 15.8 14.8  HCT 40.5 41.0  MCV 84.4 87.6  PLT 221 139*   Basic Metabolic Panel:  Recent Labs Lab 05/20/17 1030 05/20/17 1651 05/21/17 0253  NA 115* 119* 128*  K 3.1* 3.7 4.1  CL 73* 82* 96*  CO2 20* 24 24  GLUCOSE 86 86 81  BUN 8 7 9   CREATININE 0.74 0.75 0.68  CALCIUM 9.0 8.3* 8.4*  MG 2.1  --   --    GFR: Estimated Creatinine Clearance: 93.3 mL/min (by C-G formula based on SCr of 0.68 mg/dL). Liver Function Tests:  Recent Labs Lab 05/20/17 1030 05/21/17 0253  AST 68* 54*  ALT 29 22  ALKPHOS 80 60  BILITOT 1.4* 1.4*  PROT 7.5 6.1*  ALBUMIN 4.3 3.3*   No results for input(s): LIPASE, AMYLASE in the last 168 hours. No results for input(s): AMMONIA in the last 168 hours. Coagulation  Profile: No results for input(s): INR, PROTIME in the last 168 hours. Cardiac Enzymes: No results for input(s): CKTOTAL, CKMB, CKMBINDEX, TROPONINI in the last 168 hours. BNP (last 3 results) No results for input(s): PROBNP in the last 8760 hours. HbA1C: No results for input(s): HGBA1C in the last 72 hours. CBG:  Recent Labs Lab 05/21/17 0748  GLUCAP 87   Lipid Profile: No results for input(s): CHOL, HDL, LDLCALC, TRIG, CHOLHDL, LDLDIRECT in the last 72 hours. Thyroid Function Tests:  Recent Labs  05/20/17 1651  TSH 0.872   Anemia Panel: No results for input(s): VITAMINB12, FOLATE, FERRITIN, TIBC, IRON, RETICCTPCT in the last 72 hours. Sepsis Labs: No results for input(s): PROCALCITON, LATICACIDVEN in the last 168 hours.  No results found for this or any previous visit (from the past 240 hour(s)).       Radiology Studies: Dg Abd Acute W/chest  Result Date: 05/20/2017 CLINICAL DATA:  Vomiting for 3 days. EXAM: DG ABDOMEN ACUTE W/ 1V CHEST COMPARISON:  MRI right shoulder 02/28/2017.  Chest x-ray 02/27/2017. FINDINGS: Mediastinum and hilar structures normal. Heart size normal.  Mild right upper lobe and right base atelectasis and infiltrates. No pleural effusion or pneumothorax. Bilateral pleural thickening consistent with scarring. Air-filled loops of small and large bowel are noted. Mild adynamic ileus cannot be excluded. No prominent bowel distention. No free air. Right shoulder separation again noted. IMPRESSION: 1. Mild right upper lobe and right base atelectasis and infiltrates . 2. Air-filled loops of small bowel and colon noted. Mild adynamic ileus cannot be excluded . No free air. 3. Mild right upper lobe and right base atelectasis and infiltrates. 4. Right AC shoulder separation again noted. No change from prior study 02/28/2017. Electronically Signed   By: Maisie Fushomas  Register   On: 05/20/2017 14:20        Scheduled Meds: . amLODipine  5 mg Oral Daily  . carvedilol   12.5 mg Oral BID WC  . folic acid  1 mg Oral Daily  . hydrALAZINE  50 mg Oral Q8H  . LORazepam  0-4 mg Intravenous Q6H   Followed by  . [START ON 05/22/2017] LORazepam  0-4 mg Intravenous Q12H  . mometasone-formoterol  2 puff Inhalation BID  . multivitamin with minerals  1 tablet Oral Daily  . nicotine  21 mg Transdermal Daily  . pantoprazole  40 mg Oral Q0600  . senna  1 tablet Oral BID  . sodium chloride flush  3 mL Intravenous Q12H  . thiamine  100 mg Oral Daily   Or  . thiamine  100 mg Intravenous Daily  . tiotropium  18 mcg Inhalation Daily   Continuous Infusions:   LOS: 1 day    Time spent: 40 MINS    Advaith Lamarque, MD [facility] Pager [phone]  If 7PM-7AM, please contact night-coverage [email or web address] Password [email or web address] 05/21/2017, 10:26 AM

## 2017-05-21 NOTE — Progress Notes (Signed)
  Echocardiogram 2D Echocardiogram has been performed.  Alvin Benton 05/21/2017, 3:14 PM

## 2017-05-21 NOTE — Progress Notes (Signed)
PT has hands on understanding of Flutter device.

## 2017-05-21 NOTE — Care Management Note (Signed)
Case Management Note  Patient Details  Name: Alvin Benton MRN: [id number] Date of Birth: 07-16-1951  Subjective/Objective:   ETOH W/D                 Action/Plan: Date:  May 21, 2017 Chart reviewed for concurrent status and case management needs. Will continue to follow patient progress. Discharge Planning: following for needs Expected discharge date: [id number] Alvin Benton, BSN, [place]3, [place]   [phone]  Expected Discharge Date:   (unknown)               Expected Discharge Plan:  Home/Self Care  In-House Referral:  Clinical Social Work  Discharge planning Services  CM Consult  Post Acute Care Choice:    Choice offered to:     DME Arranged:    DME Agency:     HH Arranged:    HH Agency:     Status of Service:  In process, will continue to follow  If discussed at Long Length of Stay Meetings, dates discussed:    Additional Comments:  Alvin Benton, Alvin Wannamaker Lynn, RN 05/21/2017, 8:44 AM

## 2017-05-22 LAB — CBC
HCT: 40.4 % (ref 39.0–52.0)
Hemoglobin: 14.2 g/dL (ref 13.0–17.0)
MCH: 32.6 pg (ref 26.0–34.0)
MCHC: 35.1 g/dL (ref 30.0–36.0)
MCV: 92.9 fL (ref 78.0–100.0)
PLATELETS: 173 10*3/uL (ref 150–400)
RBC: 4.35 MIL/uL (ref 4.22–5.81)
RDW: 13.2 % (ref 11.5–15.5)
WBC: 6.1 10*3/uL (ref 4.0–10.5)

## 2017-05-22 LAB — BASIC METABOLIC PANEL
Anion gap: 7 (ref 5–15)
BUN: 8 mg/dL (ref 6–20)
CHLORIDE: 99 mmol/L — AB (ref 101–111)
CO2: 25 mmol/L (ref 22–32)
CREATININE: 0.69 mg/dL (ref 0.61–1.24)
Calcium: 8.8 mg/dL — ABNORMAL LOW (ref 8.9–10.3)
GFR calc Af Amer: >60 mL/min (ref >60)
GFR calc non Af Amer: >60 mL/min (ref >60)
Glucose, Bld: 107 mg/dL — ABNORMAL HIGH (ref 65–99)
Potassium: 3.5 mmol/L (ref 3.5–5.1)
Sodium: 131 mmol/L — ABNORMAL LOW (ref 135–145)

## 2017-05-22 LAB — GLUCOSE, CAPILLARY: Glucose-Capillary: 106 mg/dL — ABNORMAL HIGH (ref 65–99)

## 2017-05-22 MED ORDER — POTASSIUM CHLORIDE CRYS ER 20 MEQ PO TBCR
40.0000 meq | EXTENDED_RELEASE_TABLET | Freq: Once | ORAL | Status: AC
  Administered 2017-05-22: 40 meq via ORAL
  Filled 2017-05-22: qty 2

## 2017-05-22 NOTE — Progress Notes (Signed)
PROGRESS NOTE    Alvin Benton  XLK:[id number] DOB: 04/13/1951 DOA: 05/20/2017 PCP: Marilynn RailSeltzer, Stephen, MD   Brief Narrative:  Patient is a 66 year old gentleman history of ongoing alcohol abuse, gastroesophageal reflux disease, hypertension, IBS, tobacco abuse, hypertension presented to the ED for alcohol detox and wanted to go to rehabilitation facility. Patient noted on admission to have a significant hyponatremia with a sodium of 115, hypokalemia with a potassium of 3.1, alcohol level of 79. Acute abdominal series done worrisome for right basilar infiltrates.   Assessment & Plan:   Principal Problem:   Hyponatremia Active Problems:   Alcohol abuse   Tobacco use disorder   Allergic rhinitis   Chronic constipation   Anxiety state   Systolic murmur   Essential hypertension   Alcohol withdrawal (HCC)   Hypokalemia   Dehydration   PNA (pneumonia): pROBABLE ASPIRATION  #1 severe hyponatremia Likely secondary to hypovolemic hyponatremia in the setting of chronic alcohol abuse and thiazide diuretics. Sodium levels improving and currently at 131 from 128 from 115 on admission. Serum osmolality was low at 249. Urine osmolality was low at 203. Urine sodium was 39. Urine creatinine was 45.18. TSH within normal limits at 0.872. Saline lock IV fluids. Will not resume patient's thiazide diuretics on discharge. Follow.  #2 alcohol withdrawal/alcohol abuse Patient states he is tired of drinking alcohol and is seeking help for alcohol detox and rehabilitation. Patient with improvement with diffuse tremors. Continue the Ativan withdrawal protocol. Continue folic acid, vitamin, thiamine. Patient status post banana bag IV 1. Social work consult to help with placement once patient is medically stable.  #3 tobacco use disorder Tobacco cessation. Nicotine patch. Continue Spiriva and dulera  #4 chronic constipation Continue twice daily Senokot-S.  #5 hypertension Continue home regimen of Coreg  and hydralazine and Norvasc. Discontinued patient's HCTZ as likely contributing to patient's severe hyponatremia. Continue current regimen of Coreg and hydralazine and may adjust hydralazine as needed for better blood pressure control.  #6 hypokalemia Repleted.  #7 systolic murmur/moderate aortic regurgitation or 2-D echo 5 2018 Patient states has had a history of systolic murmur since childhood. Patient would recent 2-D echo done May 2018 showing moderate aortic regurgitation with a EF of 65-70%, mild LVH, grade 1 diastolic dysfunction.  #8 constipation/? Ileus Per acute abdominal series. Patient with 2 moderate-sized bowel movements yesterday. Patient denies any abdominal pain. Patient tolerating current diet. Continue bowel regimen of Senokot S twice daily.  #9 probable aspiration pneumonia Per chest x-ray. Patient with a history of alcohol abuse and episodes of emesis prior to admission. Chest x-ray with right basilar infiltrates. Patient is afebrile with a normal white count. Patient however with a cough which is nonproductive. Continue flutter valve. Continue Augmentin for 5-7 days.     DVT prophylaxis: SCDs Code Status: Full Family Communication: Updated patient. No family at bedside. Disposition Plan: Likely to rehabilitation facility once medically stable.   Consultants:   None  Procedures:   Acute abdominal series 05/20/2017  2-D echo 05/21/2017  Antimicrobials:   Augmentin 05/21/2017   Subjective: Patient denies chest pain. No shortness of breath. States tremors have gotten worse yesterday afternoon have improved. No abdominal pain. States filling in for social worker to help him navigate list of rehabilitation facilities for alcohol cessation.   Objective: Vitals:   05/21/17 2004 05/21/17 2127 05/22/17 0437 05/22/17 0803  BP: 135/62  133/70   Pulse: 80  62   Resp: 18  20   Temp: (!) 97.5 F (36.4 C)  97.8 F (36.6 C)   TempSrc: Oral  Oral   SpO2: 99%  98% 99% 99%  Weight:      Height:        Intake/Output Summary (Last 24 hours) at 05/22/17 1258 Last data filed at 05/22/17 1244  Gross per 24 hour  Intake          2398.33 ml  Output              250 ml  Net          2148.33 ml   Filed Weights   05/20/17 2000 05/21/17 0344  Weight: 77 kg (169 lb 12.1 oz) 80.2 kg (176 lb 12.9 oz)    Examination:  General exam: Appears calm and comfortable. Minimal tremors. Respiratory system: Some decreased BS in bases. No wheezing. Respiratory effort normal. Cardiovascular system: S1 & S2 heard, RRR. 3/6 SEM. No JVD, rubs, gallops or clicks. No pedal edema. Gastrointestinal system: Abdomen is nondistended, soft and nontender. No organomegaly or masses felt. Normal bowel sounds heard. Central nervous system: Alert and oriented. No focal neurological deficits. Extremities: Symmetric 5 x 5 power. Skin: No rashes, lesions or ulcers Psychiatry: Judgement and insight appear normal. Mood & affect appropriate.     Data Reviewed: I have personally reviewed following labs and imaging studies  CBC:  Recent Labs Lab 05/20/17 1030 05/21/17 0253 05/22/17 0613  WBC 8.3 5.7 6.1  NEUTROABS 7.0  --   --   HGB 15.8 14.8 14.2  HCT 40.5 41.0 40.4  MCV 84.4 87.6 92.9  PLT 221 139* 173   Basic Metabolic Panel:  Recent Labs Lab 05/20/17 1030 05/20/17 1651 05/21/17 0253 05/22/17 0613  NA 115* 119* 128* 131*  K 3.1* 3.7 4.1 3.5  CL 73* 82* 96* 99*  CO2 20* 24 24 25   GLUCOSE 86 86 81 107*  BUN 8 7 9 8   CREATININE 0.74 0.75 0.68 0.69  CALCIUM 9.0 8.3* 8.4* 8.8*  MG 2.1  --   --   --    GFR: Estimated Creatinine Clearance: 93.3 mL/min (by C-G formula based on SCr of 0.69 mg/dL). Liver Function Tests:  Recent Labs Lab 05/20/17 1030 05/21/17 0253  AST 68* 54*  ALT 29 22  ALKPHOS 80 60  BILITOT 1.4* 1.4*  PROT 7.5 6.1*  ALBUMIN 4.3 3.3*   No results for input(s): LIPASE, AMYLASE in the last 168 hours. No results for input(s): AMMONIA  in the last 168 hours. Coagulation Profile: No results for input(s): INR, PROTIME in the last 168 hours. Cardiac Enzymes: No results for input(s): CKTOTAL, CKMB, CKMBINDEX, TROPONINI in the last 168 hours. BNP (last 3 results) No results for input(s): PROBNP in the last 8760 hours. HbA1C: No results for input(s): HGBA1C in the last 72 hours. CBG:  Recent Labs Lab 05/21/17 0748 05/22/17 0739  GLUCAP 87 106*   Lipid Profile: No results for input(s): CHOL, HDL, LDLCALC, TRIG, CHOLHDL, LDLDIRECT in the last 72 hours. Thyroid Function Tests:  Recent Labs  05/20/17 1651  TSH 0.872   Anemia Panel: No results for input(s): VITAMINB12, FOLATE, FERRITIN, TIBC, IRON, RETICCTPCT in the last 72 hours. Sepsis Labs: No results for input(s): PROCALCITON, LATICACIDVEN in the last 168 hours.  No results found for this or any previous visit (from the past 240 hour(s)).       Radiology Studies: Dg Abd Acute W/chest  Result Date: 05/20/2017 CLINICAL DATA:  Vomiting for 3 days. EXAM: DG ABDOMEN ACUTE W/ 1V CHEST COMPARISON:  MRI right shoulder 02/28/2017.  Chest x-ray 02/27/2017. FINDINGS: Mediastinum and hilar structures normal. Heart size normal. Mild right upper lobe and right base atelectasis and infiltrates. No pleural effusion or pneumothorax. Bilateral pleural thickening consistent with scarring. Air-filled loops of small and large bowel are noted. Mild adynamic ileus cannot be excluded. No prominent bowel distention. No free air. Right shoulder separation again noted. IMPRESSION: 1. Mild right upper lobe and right base atelectasis and infiltrates . 2. Air-filled loops of small bowel and colon noted. Mild adynamic ileus cannot be excluded . No free air. 3. Mild right upper lobe and right base atelectasis and infiltrates. 4. Right AC shoulder separation again noted. No change from prior study 02/28/2017. Electronically Signed   By: Maisie Fushomas  Register   On: 05/20/2017 14:20         Scheduled Meds: . amLODipine  5 mg Oral Daily  . carvedilol  12.5 mg Oral BID WC  . folic acid  1 mg Oral Daily  . hydrALAZINE  50 mg Oral Q8H  . LORazepam  0-4 mg Intravenous Q6H   Followed by  . LORazepam  0-4 mg Intravenous Q12H  . mometasone-formoterol  2 puff Inhalation BID  . multivitamin with minerals  1 tablet Oral Daily  . nicotine  21 mg Transdermal Daily  . pantoprazole  40 mg Oral Q0600  . senna  1 tablet Oral BID  . sodium chloride flush  3 mL Intravenous Q12H  . thiamine  100 mg Oral Daily   Or  . thiamine  100 mg Intravenous Daily  . tiotropium  18 mcg Inhalation Daily   Continuous Infusions:   LOS: 2 days    Time spent: 40 MINS    Lacrecia Delval, MD [facility] Pager [phone]  If 7PM-7AM, please contact night-coverage [email or web address] Password [email or web address] 05/22/2017, 12:58 PM

## 2017-05-22 NOTE — Clinical Social Work Note (Addendum)
Clinical Social Work Assessment  Patient Details  Name: Alvin Benton MRN: [id number] Date of Birth: 08/13/1951  Date of referral:  05/22/17               Reason for consult:  Substance Use/ETOH Abuse                Permission sought to share information with:    Permission granted to share information::     Name::        Agency::     Relationship::     Contact Information:     Housing/Transportation Living arrangements for the past 2 months:  Single Family Home Source of Information:  Patient Patient Interpreter Needed:  None Criminal Activity/Legal Involvement Pertinent to Current Situation/Hospitalization:  No - Comment as needed Significant Relationships:    Lives with:  Self Do you feel safe going back to the place where you live?  Yes Need for family participation in patient care:  No   Care giving concerns:  Patient reports "I am tired of drinking".Patient request is tired of being an alcoholic and is here for detox and wanting to go to a rehabilitation facility after hospitalization.   Social Worker assessment / plan:  CSW met with patient at bedside explain role and reason for visit. Patient alert, oriented and agreeable to talk with CSW. Patient reports he wants to go to inpatient facility for detox/rehab treatment. CSW provided patient with list of treatment options, pt. Plans to review, call and inquire about programs. Patient reports this is his first time going to residential facility. During assessment, pt. Requested and wanted to walk with nurse. Requested CSW return. CSW provided contact information. Will follow up with patient.   1:00PM CSW met with patient again to assist with residential placement. At this time the patient is agreeable to go to [facility], does not want to leave [place] at this time. CSW called admissions they are requesting pt. H&P, Admissions, Labs, Progress Notes, [place], PT notes. Patient gave CSW permission to provide information to  [facility].  Patient clinical documentation faxed to [facility]. Facility reviewing, will inform csw if Medical Director will accept and offer bed.   2:30 Patient will need to be able to ambulate without assistance if admitted to facility.  CSW informed RN.  Employment status:  Full-Time Insurance information:  Other ([employer]) PT Recommendations:    Information / Referral to community resources:  Residential Substance Abuse Treatment Options, Outpatient Substance Abuse Treatment Options  Patient/Family's Response to care: Patient is motivated for sobriety.   Agreeable to care, feels he can ambulate without assistance and is frustrated he has a bed alarm and cannot walk the halls alone.   Patient/Family's Understanding of and Emotional Response to Diagnosis, Current Treatment, and Prognosis: Still Assessing.   Emotional Assessment Appearance:  Developmentally appropriate, Appears stated age Attitude/Demeanor/Rapport:    Affect (typically observed):  Accepting Orientation:  Oriented to Self, Oriented to Place, Oriented to  Time, Oriented to Situation Alcohol / Substance use:  Not Applicable Psych involvement (Current and /or in the community):  No   Discharge Needs  Concerns to be addressed:  Substance Abuse Concerns Readmission within the last 30 days:  No Current discharge risk:  Dependent with Mobility Barriers to Discharge:  Continued Medical Work up   Nicole A Sinclair, LCSW 05/22/2017, 10:57 AM  

## 2017-05-22 NOTE — Progress Notes (Signed)
Occupational Therapy Treatment Patient Details Name: Roselee NovaScott Genna MRN: [id number] DOB: [birth year] Today's Date: 05/22/2017    History of present illness Roselee NovaScott [place] is a 66 y.o. male with medical history significant of ongoing alcohol abuse, gastroesophageal reflux disease, hypertension, IBS, tobacco abuse, hypertension who presents to the ED 05/20/17  stating he is tired of being on alcoholic and is here for detox and wanting to go to a rehabilitation facility after hospitalization   OT comments  Pt steadier today but still reliant on walker.  Min guard for safety.  Pt did have one LOB which he self corrected.  Only agreeable to sitting grooming.  Walking is his priority at this time  Follow Up Recommendations  Supervision/Assistance - 24 hour    Equipment Recommendations  None recommended by OT    Recommendations for Other Services      Precautions / Restrictions Precautions Precautions: Fall Restrictions Weight Bearing Restrictions: No       Mobility Bed Mobility Overal bed mobility: Modified Independent                Transfers Overall transfer level: Needs assistance Equipment used: Rolling walker (2 wheeled) Transfers: Sit to/from Stand Sit to Stand: Min guard         General transfer comment: close guard for safety.     Balance Overall balance assessment: Needs assistance         Standing balance support: Bilateral upper extremity supported Standing balance-Leahy Scale: Poor                             ADL either performed or assessed with clinical judgement   ADL                           Toilet Transfer: Min guard;Ambulation;RW (simulated: recliner)             General ADL Comments: Pt did not want to perform standing grooming tasks nor ADL.  He was agreeable to sitting grooming after ambulating (at his request).  Pt had already walked with PT.  Min guard for safety.  Pt did have one LOB which he self corrected--he  caught RW wheel on wheel of chair     Vision       Perception     Praxis      Cognition Arousal/Alertness: Awake/alert Behavior During Therapy: WFL for tasks assessed/performed Overall Cognitive Status: Impaired/Different from baseline Area of Impairment: Safety/judgement                         Safety/Judgement: Decreased awareness of safety     General Comments: cues to attend to environmental obstacles        Exercises     Shoulder Instructions       General Comments      Pertinent Vitals/ Pain       Pain Assessment: No/denies pain  Home Living                                          Prior Functioning/Environment              Frequency  Min 2X/week        Progress Toward Goals  OT Goals(current goals can now be found in the care plan  section)  Progress towards OT goals: Progressing toward goals  Acute Rehab OT Goals Time For Goal Achievement: 05/28/17  Plan      Co-evaluation                 AM-PAC PT "6 Clicks" Daily Activity     Outcome Measure   Help from another person eating meals?: None Help from another person taking care of personal grooming?: A Little Help from another person toileting, which includes using toliet, bedpan, or urinal?: A Little Help from another person bathing (including washing, rinsing, drying)?: A Little Help from another person to put on and taking off regular upper body clothing?: A Little Help from another person to put on and taking off regular lower body clothing?: A Little 6 Click Score: 19    End of Session    OT Visit Diagnosis: Unsteadiness on feet (R26.81)   Activity Tolerance Patient tolerated treatment well   Patient Left in chair;with call bell/phone within reach;with chair alarm set   Nurse Communication          Time: [id number] OT Time Calculation (min): 11 min  Charges: OT General Charges $OT Visit: 1 Procedure OT Treatments $Therapeutic  Activity: 8-22 mins  Marica OtterMaryellen Lynell Kussman, OTR/L [phone] 05/22/2017   Merlean Pizzini 05/22/2017, 3:31 PM

## 2017-05-22 NOTE — Progress Notes (Signed)
Physical Therapy Treatment Patient Details Name: Alvin Benton MRN: [id number] DOB: 1951-09-24 Today's Date: 05/22/2017    History of Present Illness 66 y.o. male with medical history significant of ongoing alcohol abuse, gastroesophageal reflux disease, hypertension, IBS, tobacco abuse, hypertension who presents to the ED 05/20/17  stating he is tired of being on alcoholic and is here for detox and wanting to go to a rehabilitation facility after hospitalization    PT Comments    Progressing with mobility. Pt remains unsteady and at risk for falls. Encouraged pt to continue ambulating with nursing in addition to PT.    Follow Up Recommendations  Home health PT (if pt returns home). Unsure if he can d/c to substance abuse facility if he still requires a RW for ambulation     Equipment Recommendations  Rolling walker with 5" wheels    Recommendations for Other Services OT consult     Precautions / Restrictions Precautions Precautions: Fall Restrictions Weight Bearing Restrictions: No    Mobility  Bed Mobility Overal bed mobility: Modified Independent                Transfers Overall transfer level: Needs assistance Equipment used: Rolling walker (2 wheeled) Transfers: Sit to/from Stand Sit to Stand: Min guard         General transfer comment: close guard for safety.   Ambulation/Gait Ambulation/Gait assistance: Min assist Ambulation Distance (Feet): 400 Feet Assistive device: Rolling walker (2 wheeled) Gait Pattern/deviations: Staggering left;Staggering right;Drifts right/left;Step-through pattern;Decreased stride length     General Gait Details: Assist to stabilize throughout ambulation distance. Pt tolerated distance well.    Stairs            Wheelchair Mobility    Modified Rankin (Stroke Patients Only)       Balance Overall balance assessment: Needs assistance         Standing balance support: Bilateral upper extremity  supported Standing balance-Leahy Scale: Poor                              Cognition Arousal/Alertness: Awake/alert Behavior During Therapy: WFL for tasks assessed/performed Overall Cognitive Status: Impaired/Different from baseline Area of Impairment: Safety/judgement                         Safety/Judgement: Decreased awareness of safety            Exercises      General Comments        Pertinent Vitals/Pain Pain Assessment: No/denies pain    Home Living                      Prior Function            PT Goals (current goals can now be found in the care plan section) Progress towards PT goals: Progressing toward goals    Frequency    Min 3X/week      PT Plan Current plan remains appropriate    Co-evaluation              AM-PAC PT "6 Clicks" Daily Activity  Outcome Measure  Difficulty turning over in bed (including adjusting bedclothes, sheets and blankets)?: None Difficulty moving from lying on back to sitting on the side of the bed? : None Difficulty sitting down on and standing up from a chair with arms (e.g., wheelchair, bedside commode, etc,.)?: A Little Help needed moving to and  from a bed to chair (including a wheelchair)?: A Little Help needed walking in hospital room?: A Little Help needed climbing 3-5 steps with a railing? : A Little 6 Click Score: 20    End of Session Equipment Utilized During Treatment: Gait belt Activity Tolerance: Patient tolerated treatment well Patient left: in bed;with call bell/phone within reach;with bed alarm set   PT Visit Diagnosis: Muscle weakness (generalized) (M62.81);Difficulty in walking, not elsewhere classified (R26.2)     Time: [id number] PT Time Calculation (min) (ACUTE ONLY): 18 min  Charges:  $Gait Training: 8-22 mins                    G Codes:         Rebeca AlertJannie Alyssha Housh, MPT Pager: [phone]

## 2017-05-23 LAB — BASIC METABOLIC PANEL
Anion gap: 8 (ref 5–15)
BUN: 7 mg/dL (ref 6–20)
CALCIUM: 9 mg/dL (ref 8.9–10.3)
CO2: 22 mmol/L (ref 22–32)
CREATININE: 0.64 mg/dL (ref 0.61–1.24)
Chloride: 102 mmol/L (ref 101–111)
Glucose, Bld: 115 mg/dL — ABNORMAL HIGH (ref 65–99)
Potassium: 3.7 mmol/L (ref 3.5–5.1)
SODIUM: 132 mmol/L — AB (ref 135–145)

## 2017-05-23 LAB — GLUCOSE, CAPILLARY: GLUCOSE-CAPILLARY: 129 mg/dL — AB (ref 65–99)

## 2017-05-23 NOTE — Progress Notes (Signed)
PROGRESS NOTE    Alvin Benton  ZOX:[id number] DOB: [birth year] DOA: 05/20/2017 PCP: Marilynn RailSeltzer, Stephen, MD   Brief Narrative:  Patient is a 66 year old gentleman history of ongoing alcohol abuse, gastroesophageal reflux disease, hypertension, IBS, tobacco abuse, hypertension presented to the ED for alcohol detox and wanted to go to rehabilitation facility. Patient noted on admission to have a significant hyponatremia with a sodium of 115, hypokalemia with a potassium of 3.1, alcohol level of 79. Acute abdominal series done worrisome for right basilar infiltrates.   Assessment & Plan:   Principal Problem:   Hyponatremia Active Problems:   Alcohol abuse   Tobacco use disorder   Allergic rhinitis   Chronic constipation   Anxiety state   Systolic murmur   Essential hypertension   Alcohol withdrawal (HCC)   Hypokalemia   Dehydration   PNA (pneumonia): pROBABLE ASPIRATION  #1 severe hyponatremia Likely secondary to hypovolemic hyponatremia in the setting of chronic alcohol abuse and thiazide diuretics. Sodium levels improving and currently at 132 from 131 from 128 from 115 on admission. Serum osmolality was low at 249. Urine osmolality was low at 203. Urine sodium was 39. Urine creatinine was 45.18. TSH within normal limits at 0.872. Saline lock IV fluids. Will not resume patient's thiazide diuretics on discharge. Follow.  #2 alcohol withdrawal/alcohol abuse Patient states he is tired of drinking alcohol and is seeking help for alcohol detox and rehabilitation. Patient with improvement with diffuse tremors over the past few days. Patient have diffuse tremors might be coming back.. Continue the Ativan withdrawal protocol. Continue folic acid, vitamin, thiamine. Patient status post banana bag IV 1. Social work consult to help with placement once patient is medically stable.  #3 tobacco use disorder Tobacco cessation. Nicotine patch. Continue Spiriva and dulera  #4 chronic  constipation Patient now complaining of loose stools. Discontinue Senokot.   #5 hypertension Continue home regimen of Coreg and hydralazine and Norvasc. Discontinued patient's HCTZ as likely contributing to patient's severe hyponatremia. Continue current regimen of Coreg and hydralazine and may adjust hydralazine as needed for better blood pressure control.  #6 hypokalemia Repleted.  #7 systolic murmur/moderate aortic regurgitation or 2-D echo 5 2018 Patient states has had a history of systolic murmur since childhood. Patient would recent 2-D echo done May 2018 showing moderate aortic regurgitation with a EF of 65-70%, mild LVH, grade 1 diastolic dysfunction.  #8 constipation/? Ileus Per acute abdominal series. Patient with 2 moderate-sized bowel movements yesterday. Patient denies any abdominal pain. Patient tolerating current diet. Patient states had 2 loose stools today. Discontinue Senokot S.   #9 probable aspiration pneumonia Per chest x-ray. Patient with a history of alcohol abuse and episodes of emesis prior to admission. Chest x-ray with right basilar infiltrates. Patient is afebrile with a normal white count. Patient however with a cough which was nonproductive. Continue flutter valve. Continue Augmentin for 5-7 days.     DVT prophylaxis: SCDs Code Status: Full Family Communication: Updated patient. No family at bedside. Disposition Plan: Home once medically stable and improved clinically. Likely to rehabilitation facility once medically stable and discharged.   Consultants:   None  Procedures:   Acute abdominal series 05/20/2017  2-D echo 05/21/2017  Antimicrobials:   Augmentin 05/21/2017   Subjective: Patient denies chest pain. No shortness of breath. States tremors coming back. Patient states he just doesn't feel well today. Patient complaining of nausea. Patient states has had 2 loose stools today.  Objective: Vitals:   05/22/17 2100 05/22/17 2134  05/23/17 [id number]  05/23/17 1202  BP:   (!) 155/82   Pulse:   67 62  Resp:   16   Temp:   97.6 F (36.4 C)   TempSrc:   Oral   SpO2: 99% 99% 99%   Weight:   76.8 kg (169 lb 5 oz)   Height:        Intake/Output Summary (Last 24 hours) at 05/23/17 1215 Last data filed at 05/23/17 0715  Gross per 24 hour  Intake              840 ml  Output                1 ml  Net              839 ml   Filed Weights   05/20/17 2000 05/21/17 0344 05/23/17 0513  Weight: 77 kg (169 lb 12.1 oz) 80.2 kg (176 lb 12.9 oz) 76.8 kg (169 lb 5 oz)    Examination:  General exam: Appears calm and comfortable. Minimal tremors. Respiratory system: Some decreased BS in bases. No wheezing. Respiratory effort normal. Cardiovascular system: S1 & S2 heard, RRR. 3/6 SEM. No JVD, rubs, gallops or clicks. No pedal edema. Gastrointestinal system: Abdomen is nondistended, soft and nontender. No organomegaly or masses felt. Normal bowel sounds heard. Central nervous system: Alert and oriented. No focal neurological deficits. Extremities: Symmetric 5 x 5 power. Skin: No rashes, lesions or ulcers Psychiatry: Judgement and insight appear normal. Mood & affect appropriate.     Data Reviewed: I have personally reviewed following labs and imaging studies  CBC:  Recent Labs Lab 05/20/17 1030 05/21/17 0253 05/22/17 0613  WBC 8.3 5.7 6.1  NEUTROABS 7.0  --   --   HGB 15.8 14.8 14.2  HCT 40.5 41.0 40.4  MCV 84.4 87.6 92.9  PLT 221 139* 173   Basic Metabolic Panel:  Recent Labs Lab 05/20/17 1030 05/20/17 1651 05/21/17 0253 05/22/17 0613 05/23/17 0526  NA 115* 119* 128* 131* 132*  K 3.1* 3.7 4.1 3.5 3.7  CL 73* 82* 96* 99* 102  CO2 20* 24 24 25 22   GLUCOSE 86 86 81 107* 115*  BUN 8 7 9 8 7   CREATININE 0.74 0.75 0.68 0.69 0.64  CALCIUM 9.0 8.3* 8.4* 8.8* 9.0  MG 2.1  --   --   --   --    GFR: Estimated Creatinine Clearance: 93.3 mL/min (by C-G formula based on SCr of 0.64 mg/dL). Liver Function  Tests:  Recent Labs Lab 05/20/17 1030 05/21/17 0253  AST 68* 54*  ALT 29 22  ALKPHOS 80 60  BILITOT 1.4* 1.4*  PROT 7.5 6.1*  ALBUMIN 4.3 3.3*   No results for input(s): LIPASE, AMYLASE in the last 168 hours. No results for input(s): AMMONIA in the last 168 hours. Coagulation Profile: No results for input(s): INR, PROTIME in the last 168 hours. Cardiac Enzymes: No results for input(s): CKTOTAL, CKMB, CKMBINDEX, TROPONINI in the last 168 hours. BNP (last 3 results) No results for input(s): PROBNP in the last 8760 hours. HbA1C: No results for input(s): HGBA1C in the last 72 hours. CBG:  Recent Labs Lab 05/21/17 0748 05/22/17 0739 05/23/17 0725  GLUCAP 87 106* 129*   Lipid Profile: No results for input(s): CHOL, HDL, LDLCALC, TRIG, CHOLHDL, LDLDIRECT in the last 72 hours. Thyroid Function Tests:  Recent Labs  05/20/17 1651  TSH 0.872   Anemia Panel: No results for input(s): VITAMINB12, FOLATE, FERRITIN, TIBC, IRON, RETICCTPCT in  the last 72 hours. Sepsis Labs: No results for input(s): PROCALCITON, LATICACIDVEN in the last 168 hours.  No results found for this or any previous visit (from the past 240 hour(s)).       Radiology Studies: No results found.      Scheduled Meds: . amLODipine  5 mg Oral Daily  . carvedilol  12.5 mg Oral BID WC  . folic acid  1 mg Oral Daily  . hydrALAZINE  50 mg Oral Q8H  . LORazepam  0-4 mg Intravenous Q12H  . mometasone-formoterol  2 puff Inhalation BID  . multivitamin with minerals  1 tablet Oral Daily  . nicotine  21 mg Transdermal Daily  . pantoprazole  40 mg Oral Q0600  . senna  1 tablet Oral BID  . sodium chloride flush  3 mL Intravenous Q12H  . thiamine  100 mg Oral Daily   Or  . thiamine  100 mg Intravenous Daily  . tiotropium  18 mcg Inhalation Daily   Continuous Infusions:   LOS: 3 days    Time spent: 35 MINS    THOMPSON,DANIEL, MD [facility] Pager [phone]  If 7PM-7AM, please  contact night-coverage [email or web address] Password [email or web address] 05/23/2017, 12:15 PM

## 2017-05-23 NOTE — Progress Notes (Signed)
Patient confirmed [facility] Tuesday at 11:[facility]am. Patient appreciative of CSW services.  No other needs indenitfied, CSW signing off at this time.   Vivi BarrackNicole Lehi Phifer, Theresia MajorsLCSWA, MSW Clinical Social Worker 5E and Psychiatric Service Line [phone] 05/23/2017  4:51 PM

## 2017-05-23 NOTE — Progress Notes (Signed)
OT Cancellation Note  Patient Details Name: Roselee NovaScott Rallo MRN: [id number] DOB: 06/19/1951   Cancelled Treatment:    Reason Eval/Treat Not Completed: Other (comment). Checked on pt twice.  He had diarrhea this am and is now busy with phone calls, trying to get things taken care of. Will check another day.  Dajah Fischman 05/23/2017, 3:22 PM  Marica OtterMaryellen Jamy Cleckler, OTR/L [phone] 05/23/2017

## 2017-05-23 NOTE — Progress Notes (Signed)
Physical Therapy Treatment Patient Details Name: Alvin Benton Ogle MRN: [id number] DOB: May 12, 1951 Today's Date: 05/23/2017    History of Present Illness Alvin Benton is a 66 y.o. male with medical history significant of ongoing alcohol abuse, gastroesophageal reflux disease, hypertension, IBS, tobacco abuse, hypertension who presents to the ED 05/20/17  stating he is tired of being on alcoholic and is here for detox and wanting to go to a rehabilitation facility after hospitalization    PT Comments    Pt amb self around in room.  Assisted with amb great distance in hallway with walker this session to assess balance/stability/gait safety.    Follow Up Recommendations   SubAbuse  Per chart review     Equipment Recommendations       Recommendations for Other Services       Precautions / Restrictions Precautions Precautions: Fall Precaution Comments: ETOH withdrawl Restrictions Weight Bearing Restrictions: No    Mobility  Bed Mobility               General bed mobility comments: OOB in recliner  Transfers Overall transfer level: Needs assistance Equipment used: None Transfers: Sit to/from Stand;Stand Pivot Transfers Sit to Stand: Supervision;Min guard Stand pivot transfers: Supervision;Min guard       General transfer comment: close guard for safety.   Ambulation/Gait Ambulation/Gait assistance: Supervision;Min guard Ambulation Distance (Feet): 800 Feet Assistive device: None Gait Pattern/deviations: Step-through pattern;Wide base of support;Drifts right/left Gait velocity: WFL   General Gait Details: amb without walker this session.  Tolerated well with occassional need to support self rail.  No true LOB.     Stairs            Wheelchair Mobility    Modified Rankin (Stroke Patients Only)       Balance                                            Cognition Arousal/Alertness: Awake/alert Behavior During Therapy: WFL for tasks  assessed/performed Overall Cognitive Status: Within Functional Limits for tasks assessed                                 General Comments: slight delay to respond/express/thoughts      Exercises      General Comments        Pertinent Vitals/Pain Pain Assessment: No/denies pain    Home Living                      Prior Function            PT Goals (current goals can now be found in the care plan section) Progress towards PT goals: Progressing toward goals    Frequency    Min 3X/week      PT Plan Current plan remains appropriate    Co-evaluation              AM-PAC PT "6 Clicks" Daily Activity  Outcome Measure  Difficulty turning over in bed (including adjusting bedclothes, sheets and blankets)?: Total Difficulty moving from lying on back to sitting on the side of the bed? : Total Difficulty sitting down on and standing up from a chair with arms (e.g., wheelchair, bedside commode, etc,.)?: Total Help needed moving to and from a bed to chair (including a wheelchair)?: A Little Help  needed walking in hospital room?: A Little Help needed climbing 3-5 steps with a railing? : A Lot 6 Click Score: 11    End of Session Equipment Utilized During Treatment: Gait belt Activity Tolerance: Patient tolerated treatment well Patient left: in chair;with call bell/phone within reach   PT Visit Diagnosis: Muscle weakness (generalized) (M62.81);Difficulty in walking, not elsewhere classified (R26.2)     Time: [id number] PT Time Calculation (min) (ACUTE ONLY): 25 min  Charges:  $Gait Training: 23-37 mins                    Felecia ShellingLori Daesean Lazarz  PTA [facility]  Acute  Rehab Pager      [phone]

## 2017-05-23 NOTE — Progress Notes (Signed)
Home meds taken to pharmacy for storage.  

## 2017-05-23 NOTE — Progress Notes (Signed)
Pt requesting ativan, says he feels anxious, is experiencing very mild nausea and has a tremor upon examination. Will medicate with ativan at this time.

## 2017-05-23 NOTE — Progress Notes (Signed)
Pt asleep in bed at this time.

## 2017-05-23 NOTE — Progress Notes (Signed)
CSW walked hall with patient and [facility]. Patient reports he spoke with Cecilia yesterday to complete interview by phone, but has not received follow up call. CSW called spoke with Jonny RuizJohn at [facility], he reports they will call patient in next hour with insurance and co-pay information.  CSW informed pt, now waiting for call.   Vivi BarrackNicole Nalleli Largent, Theresia MajorsLCSWA, MSW Clinical Social Worker 5E and Psychiatric Service Line [phone] 05/23/2017  1:02 PM

## 2017-05-24 LAB — BASIC METABOLIC PANEL
Anion gap: 9 (ref 5–15)
BUN: 5 mg/dL — ABNORMAL LOW (ref 6–20)
CALCIUM: 9.1 mg/dL (ref 8.9–10.3)
CO2: 24 mmol/L (ref 22–32)
CREATININE: 0.64 mg/dL (ref 0.61–1.24)
Chloride: 102 mmol/L (ref 101–111)
GLUCOSE: 99 mg/dL (ref 65–99)
Potassium: 3.8 mmol/L (ref 3.5–5.1)
Sodium: 135 mmol/L (ref 135–145)

## 2017-05-24 LAB — GLUCOSE, CAPILLARY: GLUCOSE-CAPILLARY: 123 mg/dL — AB (ref 65–99)

## 2017-05-24 MED ORDER — LORAZEPAM 0.5 MG PO TABS
0.5000 mg | ORAL_TABLET | Freq: Three times a day (TID) | ORAL | Status: DC | PRN
  Administered 2017-05-24 – 2017-05-25 (×4): 0.5 mg via ORAL
  Filled 2017-05-24 (×4): qty 1

## 2017-05-24 NOTE — Progress Notes (Signed)
PROGRESS NOTE    Alvin Benton  WUJ:[id number] DOB: October 25, 1951 DOA: 05/20/2017 PCP: Marilynn Rail, MD   Brief Narrative:  Patient is a 65 year old gentleman history of ongoing alcohol abuse, gastroesophageal reflux disease, hypertension, IBS, tobacco abuse, hypertension presented to the ED for alcohol detox and wanted to go to rehabilitation facility. Patient noted on admission to have a significant hyponatremia with a sodium of 115, hypokalemia with a potassium of 3.1, alcohol level of 79. Acute abdominal series done worrisome for right basilar infiltrates.   Assessment & Plan:   Principal Problem:   Hyponatremia Active Problems:   Tobacco use disorder   Allergic rhinitis   Alcohol abuse   Chronic constipation   Anxiety state   Systolic murmur   Essential hypertension   Alcohol withdrawal (HCC)   Hypokalemia   Dehydration   PNA (pneumonia): pROBABLE ASPIRATION  #1 severe hyponatremia Likely secondary to hypovolemic hyponatremia in the setting of chronic alcohol abuse and thiazide diuretics. Sodium levels improving and currently at 132 from 131 from 128 from 115 on admission. Serum osmolality was low at 249. Urine osmolality was low at 203. Urine sodium was 39. Urine creatinine was 45.18. TSH within normal limits at 0.872. Saline lock IV fluids.  Will not resume patient's thiazide diuretics on discharge. Follow.  #2 alcohol withdrawal/alcohol abuse Patient states he is tired of drinking alcohol and is seeking help for alcohol detox and rehabilitation. Patient with improvement with diffuse tremors over the past few days. Patient have diffuse tremors might be coming back.. Continue the Ativan withdrawal protocol. Continue folic acid, vitamin, thiamine. Patient status post banana bag IV 1. Social work consult to help with placement once patient is medically stable.  #3 tobacco use disorder Tobacco cessation. Nicotine patch. Continue Spiriva and dulera  #4 chronic  constipation Patient now complaining of loose stools. Discontinue Senokot.   #5 hypertension Continue home regimen of Coreg and hydralazine and Norvasc. Discontinued patient's HCTZ as likely contributing to patient's severe hyponatremia. Continue current regimen of Coreg and hydralazine and may adjust hydralazine as needed for better blood pressure control.  #6 hypokalemia Repleted.  #7 systolic murmur/moderate aortic regurgitation or 2-D echo 5 2018 Patient states has had a history of systolic murmur since childhood. Patient would recent 2-D echo done May 2018 showing moderate aortic regurgitation with a EF of 65-70%, mild LVH, grade 1 diastolic dysfunction.  #8 constipation/? Ileus Per acute abdominal series. Patient with 2 moderate-sized bowel movements yesterday. Patient denies any abdominal pain. Patient tolerating current diet. No evidence of ileuse  #9 probable aspiration pneumonia Per chest x-ray. Patient with a history of alcohol abuse and episodes of emesis prior to admission. Chest x-ray with right basilar infiltrates. Patient is afebrile with a normal white count. Patient however with a cough which was nonproductive. Continue flutter valve. Continue Augmentin for 5-7 days.     DVT prophylaxis: SCDs Code Status: Full Family Communication: Updated patient. No family at bedside. Disposition Plan: Home once medically stable and improved clinically. Likely to rehabilitation facility once medically stable and discharged.   Consultants:   None  Procedures:   Acute abdominal series 05/20/2017  2-D echo 05/21/2017  Antimicrobials:   Augmentin 05/21/2017   Subjective: Patient denies chest pain. No shortness of breath. States tremors coming back. Patient states he just doesn't feel well today. Patient complaining of nausea. Patient states has had 2 loose stools today.  Objective: Vitals:   05/23/17 2008 05/23/17 2046 05/24/17 0542 05/24/17 0842  BP: (!) 164/70   Marland Kitchen)  156/70   Pulse: 67  62   Resp: 18  18   Temp: 98.3 F (36.8 C)  98.2 F (36.8 C)   TempSrc: Oral  Oral   SpO2: 99% 97% 99% 98%  Weight:   74.9 kg (165 lb 2 oz)   Height:        Intake/Output Summary (Last 24 hours) at 05/24/17 0910 Last data filed at 05/24/17 0730  Gross per 24 hour  Intake              720 ml  Output                0 ml  Net              720 ml   Filed Weights   05/21/17 0344 05/23/17 0513 05/24/17 0542  Weight: 80.2 kg (176 lb 12.9 oz) 76.8 kg (169 lb 5 oz) 74.9 kg (165 lb 2 oz)    Examination:  General exam: Appears calm and comfortable. Minimal tremors. Respiratory system: CTA. Respiratory effort normal. Cardiovascular system: S1 & S2 heard, RRR. 3/6 SEM. No JVD, rubs, gallops or clicks. No pedal edema. Gastrointestinal system: Abdomen is nondistended, soft and nontender. No organomegaly or masses felt. Normal bowel sounds heard. Central nervous system: Alert and oriented. No focal neurological deficits. Extremities: Symmetric 5 x 5 power. Skin: No rashes, lesions or ulcers Psychiatry: Judgement and insight appear normal. Mood & affect appropriate.     Data Reviewed: I have personally reviewed following labs and imaging studies  CBC:  Recent Labs Lab 05/20/17 1030 05/21/17 0253 05/22/17 0613  WBC 8.3 5.7 6.1  NEUTROABS 7.0  --   --   HGB 15.8 14.8 14.2  HCT 40.5 41.0 40.4  MCV 84.4 87.6 92.9  PLT 221 139* 173   Basic Metabolic Panel:  Recent Labs Lab 05/20/17 1030 05/20/17 1651 05/21/17 0253 05/22/17 0613 05/23/17 0526 05/24/17 0529  NA 115* 119* 128* 131* 132* 135  K 3.1* 3.7 4.1 3.5 3.7 3.8  CL 73* 82* 96* 99* 102 102  CO2 20* 24 24 25 22 24   GLUCOSE 86 86 81 107* 115* 99  BUN 8 7 9 8 7  5*  CREATININE 0.74 0.75 0.68 0.69 0.64 0.64  CALCIUM 9.0 8.3* 8.4* 8.8* 9.0 9.1  MG 2.1  --   --   --   --   --    GFR: Estimated Creatinine Clearance: 93.3 mL/min (by C-G formula based on SCr of 0.64 mg/dL). Liver Function  Tests:  Recent Labs Lab 05/20/17 1030 05/21/17 0253  AST 68* 54*  ALT 29 22  ALKPHOS 80 60  BILITOT 1.4* 1.4*  PROT 7.5 6.1*  ALBUMIN 4.3 3.3*   No results for input(s): LIPASE, AMYLASE in the last 168 hours. No results for input(s): AMMONIA in the last 168 hours. Coagulation Profile: No results for input(s): INR, PROTIME in the last 168 hours. Cardiac Enzymes: No results for input(s): CKTOTAL, CKMB, CKMBINDEX, TROPONINI in the last 168 hours. BNP (last 3 results) No results for input(s): PROBNP in the last 8760 hours. HbA1C: No results for input(s): HGBA1C in the last 72 hours. CBG:  Recent Labs Lab 05/21/17 0748 05/22/17 0739 05/23/17 0725 05/24/17 0733  GLUCAP 87 106* 129* 123*   Lipid Profile: No results for input(s): CHOL, HDL, LDLCALC, TRIG, CHOLHDL, LDLDIRECT in the last 72 hours. Thyroid Function Tests: No results for input(s): TSH, T4TOTAL, FREET4, T3FREE, THYROIDAB in the last 72 hours. Anemia Panel: No results  for input(s): VITAMINB12, FOLATE, FERRITIN, TIBC, IRON, RETICCTPCT in the last 72 hours. Sepsis Labs: No results for input(s): PROCALCITON, LATICACIDVEN in the last 168 hours.  No results found for this or any previous visit (from the past 240 hour(s)).       Radiology Studies: No results found.      Scheduled Meds: . amLODipine  5 mg Oral Daily  . carvedilol  12.5 mg Oral BID WC  . folic acid  1 mg Oral Daily  . hydrALAZINE  50 mg Oral Q8H  . LORazepam  0-4 mg Intravenous Q12H  . mometasone-formoterol  2 puff Inhalation BID  . multivitamin with minerals  1 tablet Oral Daily  . nicotine  21 mg Transdermal Daily  . pantoprazole  40 mg Oral Q0600  . sodium chloride flush  3 mL Intravenous Q12H  . thiamine  100 mg Oral Daily   Or  . thiamine  100 mg Intravenous Daily  . tiotropium  18 mcg Inhalation Daily   Continuous Infusions:   LOS: 4 days    Time spent: 22 MINS    OSEI-BONSU,Anjani Feuerborn, MD [facility] Pager  [phone]  If 7PM-7AM, please contact night-coverage [email or web address] Password [email or web address] 05/24/2017, 9:10 AM

## 2017-05-25 LAB — BASIC METABOLIC PANEL
Anion gap: 9 (ref 5–15)
BUN: 6 mg/dL (ref 6–20)
CALCIUM: 9 mg/dL (ref 8.9–10.3)
CO2: 23 mmol/L (ref 22–32)
CREATININE: 0.64 mg/dL (ref 0.61–1.24)
Chloride: 104 mmol/L (ref 101–111)
GFR calc Af Amer: >60 mL/min (ref >60)
GFR calc non Af Amer: >60 mL/min (ref >60)
GLUCOSE: 103 mg/dL — AB (ref 65–99)
Potassium: 3.5 mmol/L (ref 3.5–5.1)
Sodium: 136 mmol/L (ref 135–145)

## 2017-05-25 LAB — GLUCOSE, CAPILLARY: Glucose-Capillary: 110 mg/dL — ABNORMAL HIGH (ref 65–99)

## 2017-05-25 MED ORDER — TIOTROPIUM BROMIDE MONOHYDRATE 18 MCG IN CAPS: 18.0000 ug | ORAL_CAPSULE | Freq: Every day | RESPIRATORY_TRACT | 12 refills | Status: DC

## 2017-05-25 MED ORDER — MOMETASONE FURO-FORMOTEROL FUM 200-5 MCG/ACT IN AERO: 2.0000 | INHALATION_SPRAY | Freq: Two times a day (BID) | RESPIRATORY_TRACT | 0 refills | Status: DC

## 2017-05-25 MED ORDER — AMLODIPINE BESYLATE 5 MG PO TABS: 5.0000 mg | ORAL_TABLET | Freq: Every day | ORAL | 0 refills | Status: DC

## 2017-05-25 NOTE — Progress Notes (Signed)
Pt is discharged to home. DC instructions given. No concerns voiced. Pt's home med retrieved from pharmacy and given to patient. Left unit ambulatorily accompanied by this writer to catch a taxi downstairs. No concerns voiced. Derinda SisVera Jt Brabec,rn.

## 2017-05-25 NOTE — Care Management Note (Signed)
Case Management Note  Patient Details  Name: Alvin Benton MRN: [id number] Date of Birth: [birth year]  Subjective/Objective:     Hyponatremia, ETOH,                Action/Plan: Discharge Planning: NCM spoke to pt and declines RW and HH at this time. States he will going into a 30 day alcohol/drug rehab program this week. CSW following for assistance with drug rehab program.  PCP Marilynn RailSELTZER, STEPHEN MD  Expected Discharge Date:  05/25/17               Expected Discharge Plan:  Home/Self Care  In-House Referral:  Clinical Social Work  Discharge planning Services  CM Consult  Post Acute Care Choice:  NA Choice offered to:  NA  DME Arranged:  N/A DME Agency:  NA  HH Arranged:  Patient Refused HH HH Agency:  NA  Status of Service:  Completed, signed off  If discussed at [employer] Length of Stay Meetings, dates discussed:    Additional Comments:  Elliot CousinShavis, Johnnay Pleitez Ellen, RN 05/25/2017, 9:22 AM

## 2017-05-25 NOTE — Discharge Summary (Signed)
Alvin Benton, is a 66 y.o. male  DOB 1951-07-24  MRN [id number].  Admission date:  05/20/2017  Admitting Physician  Rodolph Bong, MD  Discharge Date:  05/25/2017   Primary MD  Marilynn Rail, MD  Recommendations for primary care physician for things to follow:  Continue to encourage alcohol and tobacco cessation Watch for Withdrawal symptoms Check BMP and Magnesium  Admission Diagnosis  Emesis [R11.10] Hyponatremia [E87.1]   Discharge Diagnosis  Emesis [R11.10] Hyponatremia [E87.1]   Principal Problem:   Hyponatremia Active Problems:   Tobacco use disorder   Allergic rhinitis   Alcohol abuse   Chronic constipation   Anxiety state   Systolic murmur   Essential hypertension   Alcohol withdrawal (HCC)   Hypokalemia   Dehydration   PNA (pneumonia): pROBABLE ASPIRATION      Past Medical History:  Diagnosis Date  . Blood in stool   . Diverticulitis   . ETOH abuse   . GERD (gastroesophageal reflux disease)   . Heart murmur   . History of urinary tract infection   . Hypertension   . IBS (irritable bowel syndrome)   . Migraine    "none in a long time" (03/14/2016)  . Nocturia   . Pneumonia    childhood  . Seasonal allergies   . Tobacco abuse     Past Surgical History:  Procedure Laterality Date  . COLONOSCOPY    . CYSTOSCOPY WITH STENT PLACEMENT Left 10/03/2016   Procedure: CYSTOSCOPY WITH  LEFT URETERAL CATHETER PLACEMENT;  Surgeon: Bjorn Pippin, MD;  Location: [facility];  Service: Urology;  Laterality: Left;  . PROCTOSCOPY  10/03/2016   Procedure: PROCTOSCOPY;  Surgeon: Romie Levee, MD;  Location: [facility];  Service: General;;  . TONSILLECTOMY         HPI  from the history and physical done on the day of admission:   HPI: Alvin Benton is a 66 y.o. male with medical history significant of ongoing alcohol abuse, gastroesophageal reflux disease, hypertension, IBS, tobacco  abuse, hypertension who presents to the ED stating he is tired of being on alcoholic and is here for detox and wanting to go to a rehabilitation facility after hospitalization. Patient states his last drink was at 4:30 AM the morning of admission. Patient states he drinks 5-6 small airplane bottles of liquor daily. Patient also states he drinks a lot. Patient does endorse decreased oral intake for the past 3 days the nausea and emesis and inability to keep anything down. Patient denies any fever, no chills, no chest, no shortness of breath, no diarrhea, no constipation, no abdominal pain, no melena, no hematemesis, no hematochezia, no focal neurological deficits, no cough.  ED Course: Patient seen in the ED comprehensive metabolic profile done had a sodium of 115, potassium of 3.1, chloride of 73, bicarbonate of 20, AST of 16, ALT of 29 otherwise was within normal limits. CBC was unremarkable. Alcohol level was 79. UDS was positive for benzos. Abdominal films were not done. Patient was given a bolus of  IV fluids.[facility] were called to admit the patient for further evaluation and management.     Hospital Course:  Alvin Benton is a 66 year old gentleman with history of ongoing alcohol abuse, gastroesophageal reflux disease, hypertension, IBS, tobacco abuse, hypertension presented to the ED for alcohol detox and wanted to go to rehabilitation facility. Patient noted on admission to have a significant hyponatremia with a sodium of 115, hypokalemia with a potassium of 3.1, alcohol level of 79. Acute abdominal series done worrisome for right basilar infiltrates.    #1 severe hyponatremia Likely secondary to hypovolemic hyponatremia in the setting of chronic alcohol abuse and thiazide diuretics. Sodium levels improving and currently at 132 from 131 from 128 from 115 on admission. Serum osmolality was low at 249. Urine osmolality was low at 203. Urine sodium was 39. Urine creatinine was 45.18. TSH  within normal limits at 0.872.  Saline hydration given. Resolved at time of discharge. Discontinued thiazide diuretics on discharge.   #2 alcohol withdrawal/alcohol abuse Patient states he is tired of drinking alcohol and is seeking help for alcohol detox and rehabilitation. Patient with improvement with diffuse tremors over the past few days. Patient have diffuse tremors might be coming back.. Continue the Ativan withdrawal protocol. Continue folic acid, vitamin, thiamine. Patient status post banana bag IV 1. Social work consult to help with placement once patient is medically stable.  #3 tobacco use disorder Tobacco cessation. Nicotine patch. Continue Spiriva and dulera     #5 hypertension Continue home regimen of Coreg and hydralazine and Norvasc.  Discontinued patient's HCTZ as likely contributing to patient's severe hyponatremia.  #6 hypokalemia Repleted.  #7 systolic murmur/moderate aortic regurgitation or 2-D echo 5 2018 Patient states has had a history of systolic murmur since childhood. Recent 2-D echo done May 2018 showing moderate aortic regurgitation with a EF of 65-70%, mild LVH, grade 1 diastolic dysfunction.Clinical follow-up  #8 constipation/? Ileus Per acute abdominal series. Patient with 2 moderate-sized bowel movements yesterday. Patient denies any abdominal pain. Patient tolerating current diet. No evidence of ileus  #9 probable aspiration pneumonia Per chest x-ray. Patient with a history of alcohol abuse and episodes of emesis prior to admission. Chest x-ray with right basilar infiltrates. Patient is afebrile with a normal white count. Patient however with a cough which was nonproductive. Treated with flutter valve and  Augmentin      Discharge Condition: Stable  Follow UP - [facility] on Tuesday 05/27/17 at 11AM     Consults obtained - N/A  Diet and Activity recommendation:  As advised  Discharge Instructions     Discharge Instructions      Call MD for:  difficulty breathing, headache or visual disturbances    Complete by:  As directed    Call MD for:  extreme fatigue    Complete by:  As directed    Call MD for:  hives    Complete by:  As directed    Call MD for:  persistant dizziness or light-headedness    Complete by:  As directed    Call MD for:  persistant nausea and vomiting    Complete by:  As directed    Call MD for:  redness, tenderness, or signs of infection (pain, swelling, redness, odor or green/yellow discharge around incision site)    Complete by:  As directed    Call MD for:  severe uncontrolled pain    Complete by:  As directed    Call MD for:  temperature >100.4  Complete by:  As directed    Diet - low sodium heart healthy    Complete by:  As directed    Discharge instructions    Complete by:  As directed    Keep your appointment [facility] on Tuesday.  Do not drink alcohol   Increase activity slowly    Complete by:  As directed         Discharge Medications     Allergies as of 05/25/2017   No Known Allergies     Medication List    STOP taking these medications   hydrochlorothiazide 25 MG tablet Commonly known as:  HYDRODIURIL   oxyCODONE 5 MG immediate release tablet Commonly known as:  Oxy IR/ROXICODONE   valsartan-hydrochlorothiazide 160-25 MG tablet Commonly known as:  DIOVAN-HCT     TAKE these medications   albuterol 108 (90 Base) MCG/ACT inhaler Commonly known as:  PROVENTIL HFA;VENTOLIN HFA Inhale 2 puffs into the lungs every 6 (six) hours as needed for wheezing or shortness of breath. What changed:  when to take this   amLODipine 5 MG tablet Commonly known as:  NORVASC Take 1 tablet (5 mg total) by mouth daily.   aspirin 81 MG chewable tablet Chew 1 tablet (81 mg total) by mouth daily.   carvedilol 12.5 MG tablet Commonly known as:  COREG Take 1 tablet (12.5 mg total) by mouth 2 (two) times daily with a meal.   chlordiazePOXIDE 5 MG capsule Commonly  known as:  LIBRIUM Please dispense 18 pills - Take 1 pill three times a day for 3 days, then Take 1 pill two times a day for 3 days, then Take 1 pill once a day for 3 days and stop.   folic acid 1 MG tablet Commonly known as:  FOLVITE Take 1 tablet (1 mg total) by mouth daily.   hydrALAZINE 50 MG tablet Commonly known as:  APRESOLINE Take 1 tablet (50 mg total) by mouth every 8 (eight) hours.   mometasone-formoterol 200-5 MCG/ACT Aero Commonly known as:  DULERA Inhale 2 puffs into the lungs 2 (two) times daily.   multivitamin with minerals Tabs tablet Take 1 tablet by mouth daily.   nicotine 14 mg/24hr patch Commonly known as:  NICODERM CQ - dosed in mg/24 hours Place 1 patch (14 mg total) onto the skin daily as needed (nicotine cessation).   thiamine 100 MG tablet Take 1 tablet (100 mg total) by mouth daily.   tiotropium 18 MCG inhalation capsule Commonly known as:  SPIRIVA Place 1 capsule (18 mcg total) into inhaler and inhale daily.   valsartan 160 MG tablet Commonly known as:  DIOVAN Take 160 mg by mouth daily.            Durable Medical Equipment        Start     Ordered   05/23/17 0825  For home use only DME Walker rolling  Once    Question:  Patient needs a walker to treat with the following condition  Answer:  Debility   05/23/17 0824      Major procedures and Radiology Reports  Dg Abd Acute W/chest  Result Date: 05/20/2017 CLINICAL DATA:  Vomiting for 3 days. EXAM: DG ABDOMEN ACUTE W/ 1V CHEST COMPARISON:  MRI right shoulder 02/28/2017.  Chest x-ray 02/27/2017. FINDINGS: Mediastinum and hilar structures normal. Heart size normal. Mild right upper lobe and right base atelectasis and infiltrates. No pleural effusion or pneumothorax. Bilateral pleural thickening consistent with scarring. Air-filled loops of small  and large bowel are noted. Mild adynamic ileus cannot be excluded. No prominent bowel distention. No free air. Right shoulder separation again  noted. IMPRESSION: 1. Mild right upper lobe and right base atelectasis and infiltrates . 2. Air-filled loops of small bowel and colon noted. Mild adynamic ileus cannot be excluded . No free air. 3. Mild right upper lobe and right base atelectasis and infiltrates. 4. Right AC shoulder separation again noted. No change from prior study 02/28/2017. Electronically Signed   By: Maisie Fus  Register   On: 05/20/2017 14:20    Micro Results     No results found for this or any previous visit (from the past 240 hour(s)).     Today   Subjective    Alvin Benton today has no complaints. No fever or chills. No sob. No dizziness  Patient has been seen and examined prior to discharge   Objective   Blood pressure 137/64, pulse 64, temperature 98.2 F (36.8 C), temperature source Oral, resp. rate 16, height 5\' 9"  (1.753 m), weight 74.7 kg (164 lb 9.6 oz), SpO2 98 %.   Intake/Output Summary (Last 24 hours) at 05/25/17 0923 Last data filed at 05/25/17 0756  Gross per 24 hour  Intake              930 ml  Output                0 ml  Net              930 ml    Exam Gen:- Comfortable, NAD HEENT:- [place].AT, PERRL, MMM Neck-Supple Neck,No JVD,  Lungs- CTA CV- S1, S2 normal Abd-  +ve B.Sounds, Abd Soft, No tenderness,    Extremity/Skin:- Intact peripheral pulses     Data Review   CBC w Diff:  Lab Results  Component Value Date   WBC 6.1 05/22/2017   HGB 14.2 05/22/2017   HCT 40.4 05/22/2017   PLT 173 05/22/2017   LYMPHOPCT 11 05/20/2017   MONOPCT 4 05/20/2017   EOSPCT 0 05/20/2017   BASOPCT 0 05/20/2017    CMP:  Lab Results  Component Value Date   NA 136 05/25/2017   K 3.5 05/25/2017   CL 104 05/25/2017   CO2 23 05/25/2017   BUN 6 05/25/2017   CREATININE 0.64 05/25/2017   CREATININE 0.96 09/13/2016   PROT 6.1 (L) 05/21/2017   ALBUMIN 3.3 (L) 05/21/2017   BILITOT 1.4 (H) 05/21/2017   ALKPHOS 60 05/21/2017   AST 54 (H) 05/21/2017   ALT 22 05/21/2017  .   Total Discharge time  is about 33 minutes  OSEI-BONSU,Monterrio Gerst M.D on 05/25/2017 at 9:23 AM  [facility]   Office  [phone]  Dragon dictation system was used to create this note, attempts have been made to correct errors, however presence of uncorrected errors is not a reflection quality of care provided

## 2017-06-04 ENCOUNTER — Encounter (HOSPITAL_COMMUNITY): Payer: Self-pay | Admitting: Nurse Practitioner

## 2017-06-04 ENCOUNTER — Observation Stay (HOSPITAL_COMMUNITY)
Admission: EM | Admit: 2017-06-04 | Discharge: 2017-06-05 | Disposition: A | Discharge status: Home / Self Care | Payer: Managed Care, Other (non HMO) | Attending: Internal Medicine | Admitting: Internal Medicine

## 2017-06-04 DIAGNOSIS — F17210 Nicotine dependence, cigarettes, uncomplicated: Secondary | ICD-10-CM | POA: Insufficient documentation

## 2017-06-04 DIAGNOSIS — I10 Essential (primary) hypertension: Secondary | ICD-10-CM | POA: Diagnosis not present

## 2017-06-04 DIAGNOSIS — E861 Hypovolemia: Secondary | ICD-10-CM | POA: Diagnosis not present

## 2017-06-04 DIAGNOSIS — E869 Volume depletion, unspecified: Secondary | ICD-10-CM | POA: Insufficient documentation

## 2017-06-04 DIAGNOSIS — Z7982 Long term (current) use of aspirin: Secondary | ICD-10-CM | POA: Insufficient documentation

## 2017-06-04 DIAGNOSIS — Z79899 Other long term (current) drug therapy: Secondary | ICD-10-CM | POA: Insufficient documentation

## 2017-06-04 DIAGNOSIS — F1021 Alcohol dependence, in remission: Secondary | ICD-10-CM | POA: Diagnosis not present

## 2017-06-04 DIAGNOSIS — K219 Gastro-esophageal reflux disease without esophagitis: Secondary | ICD-10-CM | POA: Diagnosis not present

## 2017-06-04 DIAGNOSIS — K589 Irritable bowel syndrome without diarrhea: Secondary | ICD-10-CM | POA: Insufficient documentation

## 2017-06-04 DIAGNOSIS — D649 Anemia, unspecified: Secondary | ICD-10-CM | POA: Insufficient documentation

## 2017-06-04 DIAGNOSIS — E871 Hypo-osmolality and hyponatremia: Secondary | ICD-10-CM | POA: Diagnosis not present

## 2017-06-04 LAB — CBC
HEMATOCRIT: 34.7 % — AB (ref 39.0–52.0)
HEMOGLOBIN: 12.5 g/dL — AB (ref 13.0–17.0)
MCH: 32 pg (ref 26.0–34.0)
MCHC: 36 g/dL (ref 30.0–36.0)
MCV: 88.7 fL (ref 78.0–100.0)
Platelets: 401 10*3/uL — ABNORMAL HIGH (ref 150–400)
RBC: 3.91 MIL/uL — AB (ref 4.22–5.81)
RDW: 13.1 % (ref 11.5–15.5)
WBC: 8.4 10*3/uL (ref 4.0–10.5)

## 2017-06-04 LAB — BASIC METABOLIC PANEL
ANION GAP: 8 (ref 5–15)
ANION GAP: 9 (ref 5–15)
BUN: 17 mg/dL (ref 6–20)
BUN: 16 mg/dL (ref 6–20)
CALCIUM: 8.8 mg/dL — AB (ref 8.9–10.3)
CHLORIDE: 98 mmol/L — AB (ref 101–111)
CO2: 21 mmol/L — ABNORMAL LOW (ref 22–32)
CO2: 21 mmol/L — ABNORMAL LOW (ref 22–32)
CREATININE: 0.96 mg/dL (ref 0.61–1.24)
Calcium: 8.4 mg/dL — ABNORMAL LOW (ref 8.9–10.3)
Chloride: 97 mmol/L — ABNORMAL LOW (ref 101–111)
Creatinine, Ser: 0.93 mg/dL (ref 0.61–1.24)
GFR calc Af Amer: >60 mL/min (ref >60)
GFR calc non Af Amer: >60 mL/min (ref >60)
Glucose, Bld: 87 mg/dL (ref 65–99)
Glucose, Bld: 85 mg/dL (ref 65–99)
POTASSIUM: 4.2 mmol/L (ref 3.5–5.1)
POTASSIUM: 4 mmol/L (ref 3.5–5.1)
SODIUM: 127 mmol/L — AB (ref 135–145)
SODIUM: 127 mmol/L — AB (ref 135–145)

## 2017-06-04 LAB — I-STAT CHEM 8, ED
BUN: 23 mg/dL — AB (ref 6–20)
BUN: 20 mg/dL (ref 6–20)
CALCIUM ION: 1.27 mmol/L (ref 1.15–1.40)
CHLORIDE: 86 mmol/L — AB (ref 101–111)
CREATININE: 1.2 mg/dL (ref 0.61–1.24)
Calcium, Ion: 1.2 mmol/L (ref 1.15–1.40)
Chloride: 90 mmol/L — ABNORMAL LOW (ref 101–111)
Creatinine, Ser: 1 mg/dL (ref 0.61–1.24)
Glucose, Bld: 95 mg/dL (ref 65–99)
Glucose, Bld: 81 mg/dL (ref 65–99)
HEMATOCRIT: 46 % (ref 39.0–52.0)
HEMATOCRIT: 36 % — AB (ref 39.0–52.0)
HEMOGLOBIN: 15.6 g/dL (ref 13.0–17.0)
HEMOGLOBIN: 12.2 g/dL — AB (ref 13.0–17.0)
POTASSIUM: 4.1 mmol/L (ref 3.5–5.1)
Potassium: 4.5 mmol/L (ref 3.5–5.1)
SODIUM: 123 mmol/L — AB (ref 135–145)
Sodium: 120 mmol/L — ABNORMAL LOW (ref 135–145)
TCO2: 25 mmol/L (ref 0–100)
TCO2: 23 mmol/L (ref 0–100)

## 2017-06-04 MED ORDER — HYDROCODONE-ACETAMINOPHEN 5-325 MG PO TABS: 1.0000 | ORAL_TABLET | ORAL | Status: DC | PRN

## 2017-06-04 MED ORDER — ACETAMINOPHEN 650 MG RE SUPP: 650.0000 mg | Freq: Four times a day (QID) | RECTAL | Status: DC | PRN

## 2017-06-04 MED ORDER — ONDANSETRON HCL 4 MG/2ML IJ SOLN: 4.0000 mg | Freq: Four times a day (QID) | INTRAMUSCULAR | Status: DC | PRN

## 2017-06-04 MED ORDER — SODIUM CHLORIDE 0.9 % IV SOLN
Freq: Once | INTRAVENOUS | Status: AC
  Administered 2017-06-04: 1000 mL/h via INTRAVENOUS

## 2017-06-04 MED ORDER — ONDANSETRON HCL 4 MG PO TABS: 4.0000 mg | ORAL_TABLET | Freq: Four times a day (QID) | ORAL | Status: DC | PRN

## 2017-06-04 MED ORDER — IRBESARTAN 150 MG PO TABS
150.0000 mg | ORAL_TABLET | Freq: Every day | ORAL | Status: DC
Start: 2017-06-05 — End: 2017-06-05
  Administered 2017-06-05: 150 mg via ORAL
  Filled 2017-06-04: qty 1

## 2017-06-04 MED ORDER — SENNOSIDES-DOCUSATE SODIUM 8.6-50 MG PO TABS: 1.0000 | ORAL_TABLET | Freq: Every evening | ORAL | Status: DC | PRN

## 2017-06-04 MED ORDER — CARBAMAZEPINE 100 MG PO CHEW
100.0000 mg | CHEWABLE_TABLET | Freq: Every day | ORAL | Status: DC
  Administered 2017-06-05: 100 mg via ORAL
  Filled 2017-06-04: qty 1

## 2017-06-04 MED ORDER — SODIUM CHLORIDE 0.9 % IV SOLN: INTRAVENOUS | Status: DC

## 2017-06-04 MED ORDER — BISACODYL 5 MG PO TBEC: 5.0000 mg | DELAYED_RELEASE_TABLET | Freq: Every day | ORAL | Status: DC | PRN

## 2017-06-04 MED ORDER — VITAMIN B-1 100 MG PO TABS
100.0000 mg | ORAL_TABLET | Freq: Every day | ORAL | Status: DC
  Administered 2017-06-05: 100 mg via ORAL
  Filled 2017-06-04: qty 1

## 2017-06-04 MED ORDER — ENOXAPARIN SODIUM 40 MG/0.4ML ~~LOC~~ SOLN
40.0000 mg | SUBCUTANEOUS | Status: DC
  Administered 2017-06-04: 40 mg via SUBCUTANEOUS
  Filled 2017-06-04: qty 0.4

## 2017-06-04 MED ORDER — ACETAMINOPHEN 325 MG PO TABS: 650.0000 mg | ORAL_TABLET | Freq: Four times a day (QID) | ORAL | Status: DC | PRN

## 2017-06-04 NOTE — H&P (Signed)
History and Physical    Alvin Benton WUJ:[id number] DOB: May 26, 1951 DOA: 06/04/2017  PCP: Marilynn RailSeltzer, Stephen, MD   Patient [facility] from: [facility]  Chief Complaint: Nausea, hyponatremia   HPI: Alvin Benton is a 66 y.o. male with medical history significant for hypertension, alcohol dependence in remission, now [facility] of abnormal labs. Patient reports that he has been strictly abstaining from alcohol for approximately 6 weeks now, and has been residing at a rehabilitation facility where he had noted the insidious onset of nausea and loss of appetite over the past several days. Symptoms continued to progress, he has eaten and drinking very little, and blood work was obtained for evaluation of this. Labs were notable for a sodium of 120, reportedly having been 132 recently, and he was directed to the ED for further evaluation of this. Patient denies any headache or tremors. Denies any change in vision or hearing or focal numbness or weakness.  ED Course: Upon arrival to the ED, patient is found to be saturating well on room air and with vital signs otherwise stable. Chemistry panel reveals a serum sodium of 120 and CBC is notable for a mild normocytic anemia with hemoglobin of 12.5. Patient was hydrated with normal saline in the emergency department, and after approximately 1 L was given, chemistries were repeated, showing improvement in sodium to 123. Patient remained hemodynamically stable in the ED and in no apparent respiratory distress, and he will be observed on the medical/surgical unit for ongoing evaluation and management of hyponatremia in the setting of hypovolemia.  Review of Systems:  All other systems reviewed and apart from HPI, are negative.  Past Medical History:  Diagnosis Date  . Blood in stool   . Diverticulitis   . ETOH abuse   . GERD (gastroesophageal reflux disease)   . Heart murmur   . History of  urinary tract infection   . Hypertension   . IBS (irritable bowel syndrome)   . Migraine    "none in a long time" (03/14/2016)  . Nocturia   . Pneumonia    childhood  . Seasonal allergies   . Tobacco abuse     Past Surgical History:  Procedure Laterality Date  . COLONOSCOPY    . CYSTOSCOPY WITH STENT PLACEMENT Left 10/03/2016   Procedure: CYSTOSCOPY WITH  LEFT URETERAL CATHETER PLACEMENT;  Surgeon: Bjorn PippinJohn Wrenn, MD;  Location: [facility];  Service: Urology;  Laterality: Left;  . PROCTOSCOPY  10/03/2016   Procedure: PROCTOSCOPY;  Surgeon: Romie LeveeAlicia Thomas, MD;  Location: [facility];  Service: General;;  . TONSILLECTOMY       reports that he has been smoking Cigarettes.  He has a 40.00 pack-year smoking history. He has never used smokeless tobacco. He reports that he drinks about 1.8 oz of alcohol per week . He reports that he does not use drugs.  No Known Allergies  Family History  Problem Relation Age of Onset  . Stroke Mother   . Colon cancer Neg Hx   . Esophageal cancer Neg Hx   . Rectal cancer Neg Hx   . Stomach cancer Neg Hx      Prior to Admission medications   Medication Sig Start Date End Date Taking? Authorizing Provider  carbamazepine (TEGRETOL) 100 MG chewable tablet Chew 100 mg by mouth as directed.   Yes [provider]  thiamine 100 MG tablet Take 1 tablet (100 mg total) by mouth daily. 03/04/17  Yes Susa RaringSingh, Prashant  K, MD  valsartan-hydrochlorothiazide (DIOVAN-HCT) 160-25 MG tablet Take 1 tablet by mouth daily.   Yes [provider]  albuterol (PROVENTIL HFA;VENTOLIN HFA) 108 (90 BASE) MCG/ACT inhaler Inhale 2 puffs into the lungs every 6 (six) hours as needed for wheezing or shortness of breath. Patient not taking: Reported on 06/04/2017 07/17/15   Sheliah Hatch, MD  amLODipine (NORVASC) 5 MG tablet Take 1 tablet (5 mg total) by mouth daily. Patient not taking: Reported on 06/04/2017 05/25/17   Jackie Plum, MD  aspirin 81 MG chewable tablet Chew 1  tablet (81 mg total) by mouth daily. Patient not taking: Reported on 05/20/2017 03/03/17   Leroy Sea, MD  carvedilol (COREG) 12.5 MG tablet Take 1 tablet (12.5 mg total) by mouth 2 (two) times daily with a meal. Patient not taking: Reported on 05/20/2017 03/03/17   Leroy Sea, MD  chlordiazePOXIDE (LIBRIUM) 5 MG capsule Please dispense 18 pills - Take 1 pill three times a day for 3 days, then Take 1 pill two times a day for 3 days, then Take 1 pill once a day for 3 days and stop. Patient not taking: Reported on 05/20/2017 03/03/17   Leroy Sea, MD  folic acid (FOLVITE) 1 MG tablet Take 1 tablet (1 mg total) by mouth daily. Patient not taking: Reported on 05/20/2017 03/03/17   Leroy Sea, MD  hydrALAZINE (APRESOLINE) 50 MG tablet Take 1 tablet (50 mg total) by mouth every 8 (eight) hours. Patient not taking: Reported on 05/20/2017 03/03/17   Leroy Sea, MD  mometasone-formoterol [facility]) 200-5 MCG/ACT AERO Inhale 2 puffs into the lungs 2 (two) times daily. Patient not taking: Reported on 06/04/2017 05/25/17   Jackie Plum, MD  Multiple Vitamin (MULTIVITAMIN WITH MINERALS) TABS tablet Take 1 tablet by mouth daily. Patient not taking: Reported on 06/04/2017 05/09/16   Rodolph Bong, MD  nicotine (NICODERM CQ - DOSED IN MG/24 HOURS) 14 mg/24hr patch Place 1 patch (14 mg total) onto the skin daily as needed (nicotine cessation). Patient not taking: Reported on 05/20/2017 03/03/17   Leroy Sea, MD  tiotropium (SPIRIVA) 18 MCG inhalation capsule Place 1 capsule (18 mcg total) into inhaler and inhale daily. Patient not taking: Reported on 06/04/2017 05/25/17   Jackie Plum, MD    Physical Exam: Vitals:   06/04/17 1851 06/04/17 1930  BP:  (!) 141/70  Pulse:  76  Resp:  18  SpO2:  100%  Weight: 74.4 kg (164 lb)   Height: 5\' 9"  (1.753 m)       Constitutional: No acute distress, calm, comfortable Eyes: PERTLA, lids and conjunctivae normal ENMT: Mucous membranes  are moist. Posterior pharynx clear of any exudate or lesions.   Neck: normal, supple, no masses, no thyromegaly Respiratory: clear to auscultation bilaterally, no wheezing, no crackles. Normal respiratory effort.   Cardiovascular: S1 & S2 heard, regular rate and rhythm. No extremity edema. No significant JVD. Abdomen: No distension, no tenderness, no masses palpated. Bowel sounds normal.  Musculoskeletal: no clubbing / cyanosis. No joint deformity upper and lower extremities.    Skin: no significant rashes, lesions, ulcers. Warm, dry, well-perfused. Poor turgor.  Neurologic: CN 2-12 grossly intact. Sensation intact, DTR normal. Strength 5/5 in all 4 limbs.  Psychiatric: Alert and oriented x 3. Calm, cooperative.     Labs on Admission: I have personally reviewed following labs and imaging studies  CBC:  Recent Labs Lab 06/04/17 1534 06/04/17 1650 06/04/17 1747  WBC  --   --  8.4  HGB 15.6 12.2* 12.5*  HCT 46.0 36.0* 34.7*  MCV  --   --  88.7  PLT  --   --  401*   Basic Metabolic Panel:  Recent Labs Lab 06/04/17 1534 06/04/17 1650 06/04/17 1747  NA 120* 123* 127*  K 4.5 4.1 4.2  CL 86* 90* 98*  CO2  --   --  21*  GLUCOSE 95 81 87  BUN 23* 20 17  CREATININE 1.20 1.00 0.96  CALCIUM  --   --  8.4*   GFR: Estimated Creatinine Clearance: 77.7 mL/min (by C-G formula based on SCr of 0.96 mg/dL). Liver Function Tests: No results for input(s): AST, ALT, ALKPHOS, BILITOT, PROT, ALBUMIN in the last 168 hours. No results for input(s): LIPASE, AMYLASE in the last 168 hours. No results for input(s): AMMONIA in the last 168 hours. Coagulation Profile: No results for input(s): INR, PROTIME in the last 168 hours. Cardiac Enzymes: No results for input(s): CKTOTAL, CKMB, CKMBINDEX, TROPONINI in the last 168 hours. BNP (last 3 results) No results for input(s): PROBNP in the last 8760 hours. HbA1C: No results for input(s): HGBA1C in the last 72 hours. CBG: No results for input(s):  GLUCAP in the last 168 hours. Lipid Profile: No results for input(s): CHOL, HDL, LDLCALC, TRIG, CHOLHDL, LDLDIRECT in the last 72 hours. Thyroid Function Tests: No results for input(s): TSH, T4TOTAL, FREET4, T3FREE, THYROIDAB in the last 72 hours. Anemia Panel: No results for input(s): VITAMINB12, FOLATE, FERRITIN, TIBC, IRON, RETICCTPCT in the last 72 hours. Urine analysis:    Component Value Date/Time   COLORURINE YELLOW 02/20/2017 1903   APPEARANCEUR CLEAR 02/20/2017 1903   LABSPEC 1.009 02/20/2017 1903   PHURINE 5.0 02/20/2017 1903   GLUCOSEU NEGATIVE 02/20/2017 1903   HGBUR NEGATIVE 02/20/2017 1903   BILIRUBINUR NEGATIVE 02/20/2017 1903   KETONESUR NEGATIVE 02/20/2017 1903   PROTEINUR NEGATIVE 02/20/2017 1903   NITRITE NEGATIVE 02/20/2017 1903   LEUKOCYTESUR NEGATIVE 02/20/2017 1903   Sepsis Labs: @LABRCNTIP (procalcitonin:4,lacticidven:4) )No results found for this or any previous visit (from the past 240 hour(s)).   Radiological Exams on Admission: No results found.  EKG: Not performed.   Assessment/Plan  1. Hyponatremia  - Pt presents with several days of nausea, loss of appetite, and serum sodium of 120 on outpt labs  - He appears hypovolemic on arrival, was hydrated with NS infusion  - Unclear if hyponatremia secondary to hypovolemia in setting of recent poor appetite and nausea, or whether hyponatremia is responsible for the poor appetite and nausea - Will add serum osmolality on to initial labs as he has already received more than a liter of NS  - Hold HCTZ, follow serial chem panels, follow I/O's    2. Alcohol dependence in remission  - Pt is currently residing at the [facility]  - He reports strict abstinence for 6 wks now, was commended for this and encouraged to continue    3. Hypertension  - BP at goal  - Continue ARB, stop HCTZ in light of hyponatremia    DVT prophylaxis: sq Lovenox  Code Status: Full  Family  Communication: Discussed with patient Disposition Plan: Observe on med-surg Consults called: None Admission status: Observation    Briscoe Deutscher, MD [facility] Pager [phone]  If 7PM-7AM, please contact night-coverage [email or web address] Password [email or web address]  06/04/2017, 8:04 PM

## 2017-06-04 NOTE — ED Provider Notes (Signed)
[facility]-EMERGENCY DEPT Provider Note   CSN: [id number] Arrival date & time: 06/04/17  1427     History   Chief Complaint No chief complaint on file.   HPI Alvin Benton is a 66 y.o. male.  HPI  66 y.o. male with a hx of HTN, presents to the Emergency Department today due to hyponatremia from [facility]. AM labs were drawn at 0600 and showed sodium 120. Pt sent to ED for evaluation. Pt denies symptoms currently. No N/V. No headaches. No vision changes. No CP/SOB/ABD pain. No fevers. Pt notes decrease in PO intake due to upset stomach. Pt states this is part of the detox most likely. Pt has been ETOH free x 1.5 months. Denies emesis with PO intake or abdominal pain currently. No other symptoms noted.   Past Medical History:  Diagnosis Date  . Blood in stool   . Diverticulitis   . ETOH abuse   . GERD (gastroesophageal reflux disease)   . Heart murmur   . History of urinary tract infection   . Hypertension   . IBS (irritable bowel syndrome)   . Migraine    "none in a long time" (03/14/2016)  . Nocturia   . Pneumonia    childhood  . Seasonal allergies   . Tobacco abuse     Patient Active Problem List   Diagnosis Date Noted  . PNA (pneumonia): pROBABLE ASPIRATION 05/21/2017  . Hyponatremia 05/20/2017  . Dehydration 05/20/2017  . Syncope 02/26/2017  . History of atrial fibrillation 02/26/2017  . Right flank hematoma 02/26/2017  . Zygomatic fracture, right side, initial encounter for closed fracture (HCC) 02/26/2017  . Multiple rib fractures 02/26/2017  . Acute pulmonary edema (HCC)   . Dizziness   . Fall   . Dehydration with hyponatremia 02/20/2017  . Alcohol withdrawal (HCC) 06/04/2016  . Volume depletion 06/04/2016  . Hypokalemia 06/04/2016  . Diverticulitis of intestine with perforation without bleeding 06/04/2016  . Essential hypertension   . Hypomagnesemia   . Ileus (HCC) 05/05/2016  . Systolic murmur 05/05/2016  . Anxiety state 04/19/2016  . 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 Hypoxia 04/05/2016  . Atrial fibrillation (HCC) 04/05/2016  . Diverticulitis of large intestine with perforation and abscess 03/14/2016  . Chronic constipation 03/08/2016  . Alcohol abuse 02/15/2016  . Tobacco use disorder 07/17/2015  . Allergic rhinitis 07/17/2015  . Reactive airway disease with wheezing 07/17/2015  . Overweight (BMI 25.0-29.9) 07/17/2015  . Hand pain, left 01/12/2014  . LLQ pain-chronic, intermittent and episodic 09/17/2013    Past Surgical History:  Procedure Laterality Date  . COLONOSCOPY    . CYSTOSCOPY WITH STENT PLACEMENT Left 10/03/2016   Procedure: CYSTOSCOPY WITH  LEFT URETERAL CATHETER PLACEMENT;  Surgeon: Bjorn Pippin, MD;  Location: [facility];  Service: Urology;  Laterality: Left;  . PROCTOSCOPY  10/03/2016   Procedure: PROCTOSCOPY;  Surgeon: Romie Levee, MD;  Location: [facility];  Service: General;;  . TONSILLECTOMY         Home Medications    Prior to Admission medications   Medication Sig Start Date End Date Taking? Authorizing Provider  Multiple Vitamins-Minerals (MULTIVITAMIN PO) Take 1 tablet by mouth daily. With Folic Acid   Yes [provider]  thiamine 100 MG tablet Take 1 tablet (100 mg total) by mouth daily. 03/04/17  Yes Leroy Sea, MD  albuterol (PROVENTIL HFA;VENTOLIN HFA) 108 (90 BASE) MCG/ACT inhaler Inhale 2 puffs into the lungs every 6 (six) hours as needed for wheezing or shortness of breath.  Patient taking differently: Inhale 2 puffs into the lungs every 4 (four) hours as needed for wheezing or shortness of breath.  07/17/15   Sheliah Hatchabori, Katherine E, MD  amLODipine (NORVASC) 5 MG tablet Take 1 tablet (5 mg total) by mouth daily. 05/25/17   Jackie Plumsei-Bonsu, George, MD  aspirin 81 MG chewable tablet Chew 1 tablet (81 mg total) by mouth daily. Patient not taking: Reported on 05/20/2017 03/03/17   Leroy SeaSingh, Prashant K, MD  carvedilol (COREG) 12.5 MG tablet Take 1 tablet (12.5 mg total) by mouth 2 (two) times daily with a  meal. Patient not taking: Reported on 05/20/2017 03/03/17   Leroy SeaSingh, Prashant K, MD  chlordiazePOXIDE (LIBRIUM) 5 MG capsule Please dispense 18 pills - Take 1 pill three times a day for 3 days, then Take 1 pill two times a day for 3 days, then Take 1 pill once a day for 3 days and stop. Patient not taking: Reported on 05/20/2017 03/03/17   Leroy SeaSingh, Prashant K, MD  folic acid (FOLVITE) 1 MG tablet Take 1 tablet (1 mg total) by mouth daily. Patient not taking: Reported on 05/20/2017 03/03/17   Leroy SeaSingh, Prashant K, MD  hydrALAZINE (APRESOLINE) 50 MG tablet Take 1 tablet (50 mg total) by mouth every 8 (eight) hours. Patient not taking: Reported on 05/20/2017 03/03/17   Leroy SeaSingh, Prashant K, MD  hydrochlorothiazide (HYDRODIURIL) 25 MG tablet Take 25 mg by mouth daily.  05/28/17   [provider]  mometasone-formoterol (DULERA) 200-5 MCG/ACT AERO Inhale 2 puffs into the lungs 2 (two) times daily. 05/25/17   Jackie Plumsei-Bonsu, George, MD  Multiple Vitamin (MULTIVITAMIN WITH MINERALS) TABS tablet Take 1 tablet by mouth daily. 05/09/16   Rodolph Bonghompson, Daniel V, MD  nicotine (NICODERM CQ - DOSED IN MG/24 HOURS) 14 mg/24hr patch Place 1 patch (14 mg total) onto the skin daily as needed (nicotine cessation). Patient not taking: Reported on 05/20/2017 03/03/17   Leroy SeaSingh, Prashant K, MD  tiotropium (SPIRIVA) 18 MCG inhalation capsule Place 1 capsule (18 mcg total) into inhaler and inhale daily. 05/25/17   Jackie Plumsei-Bonsu, George, MD  valsartan (DIOVAN) 160 MG tablet Take 160 mg by mouth daily.    [provider]    Family History Family History  Problem Relation Age of Onset  . Stroke Mother   . Colon cancer Neg Hx   . Esophageal cancer Neg Hx   . Rectal cancer Neg Hx   . Stomach cancer Neg Hx     Social History Social History  Substance Use Topics  . Smoking status: Current Every Day Smoker    Packs/day: 1.00    Years: 40.00    Types: Cigarettes  . Smokeless tobacco: Never Used  . Alcohol use 1.8 oz/week    3 Shots of  liquor per week     Comment: pt states does not drink daily has beer with football     Allergies   Patient has no known allergies.   Review of Systems Review of Systems ROS reviewed and all are negative for acute change except as noted in the HPI.  Physical Exam Updated Vital Signs BP (!) 141/70   Pulse 76   Resp 18   Ht 5\' 9"  (1.753 m)   Wt 74.4 kg (164 lb)   SpO2 100%   BMI 24.22 kg/m   Physical Exam  Constitutional: He is oriented to person, place, and time. He appears well-developed and well-nourished. No distress.  HENT:  Head: Normocephalic and atraumatic.  Right Ear: Tympanic membrane, external ear  and ear canal normal.  Left Ear: Tympanic membrane, external ear and ear canal normal.  Nose: Nose normal.  Mouth/Throat: Uvula is midline, oropharynx is clear and moist and mucous membranes are normal. No trismus in the jaw. No oropharyngeal exudate, posterior oropharyngeal erythema or tonsillar abscesses.  Eyes: Pupils are equal, round, and reactive to light. EOM are normal.  Neck: Normal range of motion. Neck supple. No tracheal deviation present.  Cardiovascular: Normal rate, regular rhythm, S1 normal, S2 normal, normal heart sounds, intact distal pulses and normal pulses.   Pulmonary/Chest: Effort normal and breath sounds normal. No respiratory distress. He has no decreased breath sounds. He has no wheezes. He has no rhonchi. He has no rales.  Abdominal: Normal appearance and bowel sounds are normal. There is no tenderness.  Musculoskeletal: Normal range of motion.  Neurological: He is alert and oriented to person, place, and time.  Skin: Skin is warm and dry.  Psychiatric: He has a normal mood and affect. His speech is normal and behavior is normal. Thought content normal.   ED Treatments / Results  Labs (all labs ordered are listed, but only abnormal results are displayed) Labs Reviewed  CBC - Abnormal; Notable for the following:       Result Value   RBC 3.91  (*)    Hemoglobin 12.5 (*)    HCT 34.7 (*)    Platelets 401 (*)    All other components within normal limits  I-STAT CHEM 8, ED - Abnormal; Notable for the following:    Sodium 120 (*)    Chloride 86 (*)    BUN 23 (*)    All other components within normal limits  I-STAT CHEM 8, ED - Abnormal; Notable for the following:    Sodium 123 (*)    Chloride 90 (*)    Hemoglobin 12.2 (*)    HCT 36.0 (*)    All other components within normal limits  BASIC METABOLIC PANEL    EKG  EKG Interpretation None       Radiology No results found.  Procedures Procedures (including critical care time)  Medications Ordered in ED Medications - No data to display   Initial Impression / Assessment and Plan / ED Course  I have reviewed the triage vital signs and the nursing notes.  Pertinent labs & imaging results that were available during my care of the patient were reviewed by me and considered in my medical decision making (see chart for details).  Final Clinical Impressions(s) / ED Diagnoses  {I have reviewed and evaluated the relevant laboratory values.   {I have reviewed the relevant previous healthcare records.  {I obtained HPI from historian. {Patient discussed with supervising physician.  ED Course:  Assessment: Pt is a 66 y.o. male with a hx of HTN, presents to the Emergency Department today due to hyponatremia from [facility]. AM labs were drawn at 0600 and showed sodium 120. Pt sent to ED for evaluation. Pt denies symptoms currently. No N/V. No headaches. No vision changes. No CP/SOB/ABD pain. No fevers. Pt notes decrease in PO intake due to upset stomach. Pt states this is part of the detox most likely. Pt has been ETOH free x 1.5 months. Denies emesis with PO intake or abdominal pain currently. . On exam, pt in NAD. Nontoxic/nonseptic appearing. VSS. Afebrile. Lungs CTA. Heart RRR. Abdomen nontender soft. iStat Chem8 120. Given 2L NS bolus with repeat sodium 123. Discussed with  attending physician. Called [facility] physician who will not  take patient back to resident facility. This is likely 2/2 HCTZ intake as well as decrease in PO intake due to detox. Will admit to medicine for observation until sodium corrected  Disposition/Plan:  Admit Pt acknowledges and agrees with plan  Supervising Physician Tilden Fossa, MD  Final diagnoses:  Hyponatremia    New Prescriptions New Prescriptions   No medications on file     Wilber Bihari 06/04/17 1949    Tilden Fossa, MD 06/06/17 [phone]

## 2017-06-04 NOTE — ED Triage Notes (Signed)
Per EMS, pt from [facility].  Labs drawn and found to have sodium 120.  Vitals:  110/72, hr 80, resp 16, 99% ra, cbg 96

## 2017-06-05 DIAGNOSIS — E871 Hypo-osmolality and hyponatremia: Secondary | ICD-10-CM | POA: Diagnosis not present

## 2017-06-05 LAB — BASIC METABOLIC PANEL
ANION GAP: 9 (ref 5–15)
ANION GAP: 11 (ref 5–15)
BUN: 11 mg/dL (ref 6–20)
BUN: 12 mg/dL (ref 6–20)
CALCIUM: 9.6 mg/dL (ref 8.9–10.3)
CHLORIDE: 97 mmol/L — AB (ref 101–111)
CO2: 22 mmol/L (ref 22–32)
CO2: 20 mmol/L — ABNORMAL LOW (ref 22–32)
Calcium: 9.2 mg/dL (ref 8.9–10.3)
Chloride: 101 mmol/L (ref 101–111)
Creatinine, Ser: 0.78 mg/dL (ref 0.61–1.24)
Creatinine, Ser: 0.91 mg/dL (ref 0.61–1.24)
GFR calc Af Amer: >60 mL/min (ref >60)
Glucose, Bld: 90 mg/dL (ref 65–99)
Glucose, Bld: 98 mg/dL (ref 65–99)
POTASSIUM: 4.2 mmol/L (ref 3.5–5.1)
Potassium: 6.1 mmol/L — ABNORMAL HIGH (ref 3.5–5.1)
SODIUM: 128 mmol/L — AB (ref 135–145)
SODIUM: 132 mmol/L — AB (ref 135–145)

## 2017-06-05 LAB — CBC
HCT: 39.1 % (ref 39.0–52.0)
HEMOGLOBIN: 14.1 g/dL (ref 13.0–17.0)
MCH: 32.9 pg (ref 26.0–34.0)
MCHC: 36.1 g/dL — ABNORMAL HIGH (ref 30.0–36.0)
MCV: 91.1 fL (ref 78.0–100.0)
PLATELETS: 466 10*3/uL — AB (ref 150–400)
RBC: 4.29 MIL/uL (ref 4.22–5.81)
RDW: 13.2 % (ref 11.5–15.5)
WBC: 8.2 10*3/uL (ref 4.0–10.5)

## 2017-06-05 LAB — POTASSIUM: POTASSIUM: 4.4 mmol/L (ref 3.5–5.1)

## 2017-06-05 LAB — OSMOLALITY: OSMOLALITY: 264 mosm/kg — AB (ref 275–295)

## 2017-06-05 MED ORDER — IRBESARTAN 150 MG PO TABS: 150.0000 mg | ORAL_TABLET | Freq: Every day | ORAL | 0 refills | Status: DC

## 2017-06-05 NOTE — Progress Notes (Signed)
[facility] CSW spoke with Dorene GrebeNatalie, RN, patient vitals are "much better" he can readmit. She reports transportation will pick patient up out front at 4:15pm. CSW notified nurse. No other needs identified at this time.   Vivi BarrackNicole Tyeisha Dinan, Theresia MajorsLCSWA, MSW Clinical Social Worker 5E and Psychiatric Service Line [phone] 06/05/2017  3:37 PM

## 2017-06-05 NOTE — Care Management Note (Signed)
Case Management Note  Patient Details  Name: Alvin Benton MRN: [id number] Date of Birth: 02-21-1951  Subjective/Objective:  ETOH rehab and hyponatremia                  Action/Plan: Date:  June 05, 2017 Chart reviewed for concurrent status and case management needs. Will continue to follow patient progress. Discharge Planning: following for needs/From [facility] Expected discharge date: [id number] Marcelle SmilingRhonda Amere Iott, BSN, [place]RN3, [place]M   [phone]  Expected Discharge Date:   (unknown)               Expected Discharge Plan:  Home/Self Care  In-House Referral:     Discharge planning Services  CM Consult  Post Acute Care Choice:    Choice offered to:     DME Arranged:    DME Agency:     HH Arranged:    HH Agency:     Status of Service:  In process, will continue to follow  If discussed at Long Length of Stay Meetings, dates discussed:    Additional Comments:  Golda AcreDavis, Okechukwu Regnier Lynn, RN 06/05/2017, 8:48 AM

## 2017-06-05 NOTE — Discharge Summary (Signed)
[facility]  Physician Discharge Summary   Patient ID: Alvin Benton MRN: [id number] DOB/AGE: 06/27/1951 66 y.o.  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 Patient will need blood work on Monday, August 13. Basic metabolic panel to check sodium level. 2. Please note that patient's blood pressure medications have been changed.   DISCHARGE CONDITION: fair  Diet recommendation: Heart healthy  Filed Weights   06/04/17 1851 06/05/17 0430  Weight: 74.4 kg (164 lb) 75.2 kg (165 lb 12.6 oz)    INITIAL HISTORY: 66 year old Caucasian male with a past medical history of hypertension, alcohol dependence in remission, who is in an outpatient alcohol rehabilitation program at [facility]. He was sent over from there due to low sodium levels. Patient was hospitalized last month with hyponatremia, which was thought to be due to a combination of diuretic use as well as alcoholism. Sodium level had improved at the time of discharge. His HCTZ was discontinued. However, it appears that HCTZ was resumed at the [facility]. Sodium level was 120 at the time of admission.   HOSPITAL COURSE:   Hyponatremia This is patient's second hospitalization for similar reason. During his previous hospitalization Hydrochlorothiazide was discontinued per discharge summary. However, it appears that this was resumed at [facility]. Diuretic could be the reason for his low sodium level. No other reason has been found. Patient denied any nausea, vomiting, diarrhea. No other volume loss. He has been having Gatorade and denies excessive water intake. Sodium level has corrected with the normal saline infusion. Level is 132 today. We will continue to  recommend discontinuation of HCTZ. A new prescription has been written for the patient for his blood pressure medication. We will recommend repeating blood work early next week. During his previous hospitalization. He did undergo workup including TSH level which was normal. Urine osmolality was 203. If sodium level continues to be low despite discontinuation of diuretic, then further workup may be necessary.  History of essential hypertension.  Patient was on combination tablet of ARBand HCTZ. This has been changed over to just ARb alone. Blood pressure is reasonably well controlled.  Alcohol dependence in remission. He is currently is residing at the [facility] rehabilitation facility. He has been off of alcohol for the past many weeks.  Overall, stable. Patient's sodium level has improved. Okay for discharge back to [facility].    PERTINENT LABS:  The results of significant diagnostics from this hospitalization (including imaging, microbiology, ancillary and laboratory) are listed below for reference.      Labs: Basic Metabolic Panel:  Recent Labs Lab 06/04/17 1650 06/04/17 1747 06/04/17 2225 06/05/17 0713 06/05/17 1128 06/05/17 1353  NA 123* 127* 127* 128* 132*  --   K 4.1 4.2 4.0 4.2 6.1* 4.4  CL 90* 98* 97* 97* 101  --   CO2  --  21* 21* 22 20*  --   GLUCOSE 81 87 85 90 98  --   BUN 20 17 16 11 12   --   CREATININE 1.00 0.96 0.93 0.78 0.91  --   CALCIUM  --  8.4* 8.8* 9.2 9.6  --    CBC:  Recent Labs Lab 06/04/17 1534 06/04/17 1650 06/04/17 1747 06/05/17 0713  WBC  --   --  8.4 8.2  HGB 15.6 12.2* 12.5* 14.1  HCT 46.0 36.0* 34.7* 39.1  MCV  --   --  88.7 91.1  PLT  --   --  401* 466*      DISCHARGE EXAMINATION: Vitals:   06/04/17 1930 06/04/17 2144 06/05/17 0430 06/05/17 1359  BP: (!) 141/70 (!) 104/50 (!) 150/73 114/71  Pulse: 76 71 65 65  Resp: 18 20 20 20   Temp:  98.2 F (36.8 C) 98 F (36.7 C) 98.6 F (37 C)  TempSrc:  Oral Oral Oral    SpO2: 100% 100% 100% 99%  Weight:   75.2 kg (165 lb 12.6 oz)   Height:       General appearance: alert, cooperative, appears stated age and no distress Resp: clear to auscultation bilaterally Cardio: regular rate and rhythm, S1, S2 normal, no murmur, click, rub or gallop GI: soft, non-tender; bowel sounds normal; no masses,  no organomegaly  DISPOSITION: [facility]  Discharge Instructions    Call MD for:  extreme fatigue    Complete by:  As directed    Call MD for:  persistant dizziness or light-headedness    Complete by:  As directed    Call MD for:  persistant nausea and vomiting    Complete by:  As directed    Call MD for:  severe uncontrolled pain    Complete by:  As directed    Call MD for:  temperature >100.4    Complete by:  As directed    Discharge instructions    Complete by:  As directed    See DC summary. [facility] on 06/09/17 for hyponatremia.  You were cared for by a hospitalist during your hospital stay. If you have any questions about your discharge medications or the care you received while you were in the hospital after you are discharged, you can call the unit and asked to speak with the hospitalist on call if the hospitalist that took care of you is not available. Once you are discharged, your primary care physician will handle any further medical issues. Please note that NO REFILLS for any discharge medications will be authorized once you are discharged, as it is imperative that you return to your primary care physician (or establish a relationship with a primary care physician if you do not have one) for your aftercare needs so that they can reassess your need for medications and monitor your lab values. If you do not have a primary care physician, you can call [phone] for a physician referral.   Increase activity slowly    Complete by:  As directed       ALLERGIES: No Known Allergies   Current Discharge Medication List    START taking these medications    Details  irbesartan (AVAPRO) 150 MG tablet Take 1 tablet (150 mg total) by mouth daily. Qty: 30 tablet, Refills: 0      CONTINUE these medications which have NOT CHANGED   Details  carbamazepine (TEGRETOL) 100 MG chewable tablet Chew 100 mg by mouth as directed.    thiamine 100 MG tablet Take 1 tablet (100 mg total) by mouth daily.      STOP taking these medications     valsartan-hydrochlorothiazide (DIOVAN-HCT) 160-25 MG tablet      albuterol (PROVENTIL HFA;VENTOLIN HFA) 108 (90 BASE) MCG/ACT inhaler      amLODipine (NORVASC) 5 MG tablet      aspirin 81 MG chewable tablet      carvedilol (COREG) 12.5 MG tablet  chlordiazePOXIDE (LIBRIUM) 5 MG capsule      folic acid (FOLVITE) 1 MG tablet      hydrALAZINE (APRESOLINE) 50 MG tablet      mometasone-formoterol (DULERA) 200-5 MCG/ACT AERO      Multiple Vitamin (MULTIVITAMIN WITH MINERALS) TABS tablet      nicotine (NICODERM CQ - DOSED IN MG/24 HOURS) 14 mg/24hr patch      tiotropium (SPIRIVA) 18 MCG inhalation capsule          Follow-up Information    Marilynn RailSeltzer, Stephen, MD. Schedule an appointment as soon as possible for a visit in 1 week(s).   Specialty:  Family Medicine          TOTAL DISCHARGE TIME: 35 mins  Sem[facility] Murphey ClinicKRISHNAN,Kutler Vanvranken  [facility] Pager [phone]  06/05/2017, 3:15 PM

## 2017-06-05 NOTE — Progress Notes (Signed)
CSW following to assist with discharge back to [facility]. This Clinical research associatewriter completed full assessment on patient 05/22/2017  for etoh resources and treatment. [facility] with [facility] and currenty admitted for under observation for high sodium levels. Plan per [facility] is [facility]. CSW spoke with Joy in admission at facility, she request updated clinical information. She reports the information will need to be reviewed before patient can return.   Vivi BarrackNicole Izaia Say, Theresia MajorsLCSWA, MSW Clinical Social Worker 5E and Psychiatric Service Line [phone] 06/05/2017  10:33 AM

## 2017-06-05 NOTE — Discharge Instructions (Signed)
Hyponatremia Hyponatremia is when the amount of salt (sodium) in your blood is too low. When salt levels are low, your cells absorb extra water and they swell. The swelling happens throughout the body, but it mostly affects the brain. Follow these instructions at home:  Take medicines only as told by your doctor. Many medicines can make this condition worse. Talk with your doctor about any medicines that you are currently taking.  Carefully follow a recommended diet as told by your doctor.  Carefully follow instructions from your doctor about fluid restrictions.  Keep all follow-up visits as told by your doctor. This is important.  Do not drink alcohol. Contact a doctor if:  You feel sicker to your stomach (nauseous).  You feel more confused.  You feel more tired (fatigued).  Your headache gets worse.  You feel weaker.  Your symptoms go away and then they come back.  You have trouble following the diet instructions. Get help right away if:  You start to twitch and shake (have a seizure).  You pass out (faint).  You keep having watery poop (diarrhea).  You keep throwing up (vomiting). This information is not intended to replace advice given to you by your health care provider. Make sure you discuss any questions you have with your health care provider. Document Released: 06/26/2011 Document Revised: 03/21/2016 Document Reviewed: 10/10/2014 Elsevier Interactive Patient Education  2018 Elsevier Inc.  

## 2017-06-28 DIAGNOSIS — I4891 Unspecified atrial fibrillation: Secondary | ICD-10-CM

## 2017-06-28 HISTORY — DX: Unspecified atrial fibrillation: I48.91

## 2017-07-21 ENCOUNTER — Emergency Department (HOSPITAL_COMMUNITY): Payer: Managed Care, Other (non HMO)

## 2017-07-21 ENCOUNTER — Encounter (HOSPITAL_COMMUNITY): Payer: Self-pay | Admitting: Emergency Medicine

## 2017-07-21 ENCOUNTER — Observation Stay (HOSPITAL_COMMUNITY)
Admission: EM | Admit: 2017-07-21 | Discharge: 2017-07-22 | Disposition: A | Discharge status: Home / Self Care | Payer: Managed Care, Other (non HMO) | Attending: Internal Medicine | Admitting: Internal Medicine

## 2017-07-21 ENCOUNTER — Observation Stay (HOSPITAL_BASED_OUTPATIENT_CLINIC_OR_DEPARTMENT_OTHER): Payer: Managed Care, Other (non HMO)

## 2017-07-21 DIAGNOSIS — I483 Typical atrial flutter: Secondary | ICD-10-CM | POA: Diagnosis not present

## 2017-07-21 DIAGNOSIS — I340 Nonrheumatic mitral (valve) insufficiency: Secondary | ICD-10-CM | POA: Diagnosis not present

## 2017-07-21 DIAGNOSIS — E876 Hypokalemia: Secondary | ICD-10-CM | POA: Insufficient documentation

## 2017-07-21 DIAGNOSIS — I4891 Unspecified atrial fibrillation: Secondary | ICD-10-CM | POA: Insufficient documentation

## 2017-07-21 DIAGNOSIS — E871 Hypo-osmolality and hyponatremia: Secondary | ICD-10-CM | POA: Insufficient documentation

## 2017-07-21 DIAGNOSIS — F419 Anxiety disorder, unspecified: Secondary | ICD-10-CM | POA: Insufficient documentation

## 2017-07-21 DIAGNOSIS — F17210 Nicotine dependence, cigarettes, uncomplicated: Secondary | ICD-10-CM | POA: Diagnosis not present

## 2017-07-21 DIAGNOSIS — I351 Nonrheumatic aortic (valve) insufficiency: Secondary | ICD-10-CM | POA: Diagnosis not present

## 2017-07-21 DIAGNOSIS — R011 Cardiac murmur, unspecified: Secondary | ICD-10-CM | POA: Diagnosis not present

## 2017-07-21 DIAGNOSIS — E860 Dehydration: Secondary | ICD-10-CM | POA: Diagnosis present

## 2017-07-21 DIAGNOSIS — J45909 Unspecified asthma, uncomplicated: Secondary | ICD-10-CM | POA: Insufficient documentation

## 2017-07-21 DIAGNOSIS — R112 Nausea with vomiting, unspecified: Secondary | ICD-10-CM | POA: Diagnosis present

## 2017-07-21 DIAGNOSIS — Z79899 Other long term (current) drug therapy: Secondary | ICD-10-CM | POA: Insufficient documentation

## 2017-07-21 DIAGNOSIS — I471 Supraventricular tachycardia: Secondary | ICD-10-CM | POA: Diagnosis not present

## 2017-07-21 DIAGNOSIS — S43101A Unspecified dislocation of right acromioclavicular joint, initial encounter: Secondary | ICD-10-CM | POA: Insufficient documentation

## 2017-07-21 DIAGNOSIS — Z87440 Personal history of urinary (tract) infections: Secondary | ICD-10-CM | POA: Insufficient documentation

## 2017-07-21 DIAGNOSIS — K589 Irritable bowel syndrome without diarrhea: Secondary | ICD-10-CM | POA: Diagnosis not present

## 2017-07-21 DIAGNOSIS — K219 Gastro-esophageal reflux disease without esophagitis: Secondary | ICD-10-CM | POA: Insufficient documentation

## 2017-07-21 DIAGNOSIS — E872 Acidosis: Secondary | ICD-10-CM | POA: Diagnosis not present

## 2017-07-21 DIAGNOSIS — I350 Nonrheumatic aortic (valve) stenosis: Secondary | ICD-10-CM | POA: Diagnosis not present

## 2017-07-21 DIAGNOSIS — R778 Other specified abnormalities of plasma proteins: Secondary | ICD-10-CM

## 2017-07-21 DIAGNOSIS — I083 Combined rheumatic disorders of mitral, aortic and tricuspid valves: Secondary | ICD-10-CM | POA: Insufficient documentation

## 2017-07-21 DIAGNOSIS — I480 Paroxysmal atrial fibrillation: Secondary | ICD-10-CM | POA: Diagnosis not present

## 2017-07-21 DIAGNOSIS — Z823 Family history of stroke: Secondary | ICD-10-CM | POA: Diagnosis not present

## 2017-07-21 DIAGNOSIS — R748 Abnormal levels of other serum enzymes: Secondary | ICD-10-CM | POA: Diagnosis not present

## 2017-07-21 DIAGNOSIS — F1010 Alcohol abuse, uncomplicated: Secondary | ICD-10-CM

## 2017-07-21 DIAGNOSIS — I4892 Unspecified atrial flutter: Secondary | ICD-10-CM | POA: Diagnosis not present

## 2017-07-21 DIAGNOSIS — I214 Non-ST elevation (NSTEMI) myocardial infarction: Secondary | ICD-10-CM

## 2017-07-21 DIAGNOSIS — Z9114 Patient's other noncompliance with medication regimen: Secondary | ICD-10-CM | POA: Insufficient documentation

## 2017-07-21 DIAGNOSIS — Z6824 Body mass index (BMI) 24.0-24.9, adult: Secondary | ICD-10-CM | POA: Insufficient documentation

## 2017-07-21 DIAGNOSIS — I10 Essential (primary) hypertension: Secondary | ICD-10-CM | POA: Insufficient documentation

## 2017-07-21 DIAGNOSIS — Z9119 Patient's noncompliance with other medical treatment and regimen: Secondary | ICD-10-CM | POA: Insufficient documentation

## 2017-07-21 DIAGNOSIS — E663 Overweight: Secondary | ICD-10-CM | POA: Diagnosis not present

## 2017-07-21 DIAGNOSIS — X58XXXA Exposure to other specified factors, initial encounter: Secondary | ICD-10-CM | POA: Diagnosis not present

## 2017-07-21 DIAGNOSIS — Z7982 Long term (current) use of aspirin: Secondary | ICD-10-CM | POA: Insufficient documentation

## 2017-07-21 DIAGNOSIS — K5900 Constipation, unspecified: Secondary | ICD-10-CM | POA: Diagnosis not present

## 2017-07-21 DIAGNOSIS — R7989 Other specified abnormal findings of blood chemistry: Secondary | ICD-10-CM

## 2017-07-21 DIAGNOSIS — E8729 Other acidosis: Secondary | ICD-10-CM | POA: Diagnosis present

## 2017-07-21 HISTORY — DX: Unspecified atrial fibrillation: I48.91

## 2017-07-21 LAB — APTT: aPTT: 24 seconds (ref 24–36)

## 2017-07-21 LAB — COMPREHENSIVE METABOLIC PANEL
ALBUMIN: 4.3 g/dL (ref 3.5–5.0)
ALK PHOS: 52 U/L (ref 38–126)
ALT: 39 U/L (ref 17–63)
AST: 60 U/L — AB (ref 15–41)
Anion gap: 22 — ABNORMAL HIGH (ref 5–15)
BUN: 10 mg/dL (ref 6–20)
CALCIUM: 9.6 mg/dL (ref 8.9–10.3)
CHLORIDE: 97 mmol/L — AB (ref 101–111)
CO2: 17 mmol/L — AB (ref 22–32)
CREATININE: 0.9 mg/dL (ref 0.61–1.24)
Glucose, Bld: 156 mg/dL — ABNORMAL HIGH (ref 65–99)
Potassium: 4.4 mmol/L (ref 3.5–5.1)
SODIUM: 136 mmol/L (ref 135–145)
Total Bilirubin: 3 mg/dL — ABNORMAL HIGH (ref 0.3–1.2)
Total Protein: 7.5 g/dL (ref 6.5–8.1)

## 2017-07-21 LAB — URINALYSIS, ROUTINE W REFLEX MICROSCOPIC
BACTERIA UA: NONE SEEN
BILIRUBIN URINE: NEGATIVE
GLUCOSE, UA: 50 mg/dL — AB
KETONES UR: 80 mg/dL — AB
Leukocytes, UA: NEGATIVE
Nitrite: NEGATIVE
Protein, ur: 100 mg/dL — AB
SPECIFIC GRAVITY, URINE: 1.02 (ref 1.005–1.030)
pH: 5 (ref 5.0–8.0)

## 2017-07-21 LAB — CBC
HCT: 43.1 % (ref 39.0–52.0)
Hemoglobin: 14.7 g/dL (ref 13.0–17.0)
MCH: 31.5 pg (ref 26.0–34.0)
MCHC: 34.1 g/dL (ref 30.0–36.0)
MCV: 92.3 fL (ref 78.0–100.0)
Platelets: 307 10*3/uL (ref 150–400)
RBC: 4.67 MIL/uL (ref 4.22–5.81)
RDW: 13.7 % (ref 11.5–15.5)
WBC: 10.6 10*3/uL — AB (ref 4.0–10.5)

## 2017-07-21 LAB — MAGNESIUM: Magnesium: 1.6 mg/dL — ABNORMAL LOW (ref 1.7–2.4)

## 2017-07-21 LAB — TROPONIN I
TROPONIN I: 0.12 ng/mL — AB (ref <0.03)
Troponin I: 0.16 ng/mL (ref <0.03)
Troponin I: 0.21 ng/mL (ref <0.03)
Troponin I: 0.18 ng/mL (ref <0.03)

## 2017-07-21 LAB — BASIC METABOLIC PANEL
Anion gap: 15 (ref 5–15)
BUN: 12 mg/dL (ref 6–20)
CALCIUM: 8.8 mg/dL — AB (ref 8.9–10.3)
CO2: 20 mmol/L — ABNORMAL LOW (ref 22–32)
CREATININE: 0.8 mg/dL (ref 0.61–1.24)
Chloride: 100 mmol/L — ABNORMAL LOW (ref 101–111)
GFR calc Af Amer: >60 mL/min (ref >60)
Glucose, Bld: 94 mg/dL (ref 65–99)
POTASSIUM: 4.4 mmol/L (ref 3.5–5.1)
SODIUM: 135 mmol/L (ref 135–145)

## 2017-07-21 LAB — LIPID PANEL
CHOLESTEROL: 262 mg/dL — AB (ref 0–200)
HDL: 130 mg/dL (ref >40)
LDL Cholesterol: 114 mg/dL — ABNORMAL HIGH (ref 0–99)
TRIGLYCERIDES: 92 mg/dL (ref <150)
Total CHOL/HDL Ratio: 2 RATIO
VLDL: 18 mg/dL (ref 0–40)

## 2017-07-21 LAB — ETHANOL: Alcohol, Ethyl (B): <5 mg/dL (ref <5)

## 2017-07-21 LAB — ECHOCARDIOGRAM COMPLETE
Height: 69 in
Weight: 2641.99 oz

## 2017-07-21 LAB — LIPASE, BLOOD: LIPASE: 32 U/L (ref 11–51)

## 2017-07-21 LAB — HEPARIN LEVEL (UNFRACTIONATED)
HEPARIN UNFRACTIONATED: 0.18 [IU]/mL — AB (ref 0.30–0.70)
Heparin Unfractionated: 0.36 IU/mL (ref 0.30–0.70)

## 2017-07-21 LAB — PHOSPHORUS: PHOSPHORUS: 2.9 mg/dL (ref 2.5–4.6)

## 2017-07-21 LAB — PROTIME-INR
INR: 0.95
Prothrombin Time: 12.6 seconds (ref 11.4–15.2)

## 2017-07-21 MED ORDER — SODIUM CHLORIDE 0.9 % IV SOLN: 250.0000 mL | INTRAVENOUS | Status: DC | PRN

## 2017-07-21 MED ORDER — HEPARIN BOLUS VIA INFUSION
2000.0000 [IU] | Freq: Once | INTRAVENOUS | Status: AC
  Administered 2017-07-21: 2000 [IU] via INTRAVENOUS
  Filled 2017-07-21: qty 2000

## 2017-07-21 MED ORDER — DEXTROSE-NACL 5-0.45 % IV SOLN: INTRAVENOUS | Status: DC

## 2017-07-21 MED ORDER — ASPIRIN 81 MG PO CHEW
324.0000 mg | CHEWABLE_TABLET | Freq: Once | ORAL | Status: AC
Start: 2017-07-21 — End: 2017-07-21
  Administered 2017-07-21: 324 mg via ORAL
  Filled 2017-07-21: qty 4

## 2017-07-21 MED ORDER — SODIUM CHLORIDE 0.9 % WEIGHT BASED INFUSION: 1.0000 mL/kg/h | INTRAVENOUS | Status: DC

## 2017-07-21 MED ORDER — FOLIC ACID 1 MG PO TABS
1.0000 mg | ORAL_TABLET | Freq: Every day | ORAL | Status: DC
  Administered 2017-07-21 – 2017-07-22 (×2): 1 mg via ORAL
  Filled 2017-07-21 (×2): qty 1

## 2017-07-21 MED ORDER — DILTIAZEM LOAD VIA INFUSION
20.0000 mg | Freq: Once | INTRAVENOUS | Status: AC
  Administered 2017-07-21: 20 mg via INTRAVENOUS
  Filled 2017-07-21: qty 20

## 2017-07-21 MED ORDER — LORAZEPAM 2 MG/ML IJ SOLN: 1.0000 mg | Freq: Four times a day (QID) | INTRAMUSCULAR | Status: DC | PRN

## 2017-07-21 MED ORDER — SODIUM CHLORIDE 0.9 % IV BOLUS (SEPSIS)
1000.0000 mL | Freq: Once | INTRAVENOUS | Status: AC
  Administered 2017-07-21: 1000 mL via INTRAVENOUS

## 2017-07-21 MED ORDER — ONDANSETRON HCL 4 MG/2ML IJ SOLN: 4.0000 mg | Freq: Four times a day (QID) | INTRAMUSCULAR | Status: DC | PRN

## 2017-07-21 MED ORDER — SODIUM CHLORIDE 0.9% FLUSH: 3.0000 mL | INTRAVENOUS | Status: DC | PRN

## 2017-07-21 MED ORDER — THIAMINE HCL 100 MG/ML IJ SOLN: Freq: Once | INTRAVENOUS | Status: DC

## 2017-07-21 MED ORDER — SODIUM CHLORIDE 0.9 % IV SOLN
INTRAVENOUS | Status: DC
Start: 2017-07-21 — End: 2017-07-22
  Administered 2017-07-21: 75 mL/h via INTRAVENOUS

## 2017-07-21 MED ORDER — HEPARIN (PORCINE) IN NACL 100-0.45 UNIT/ML-% IJ SOLN
1150.0000 [IU]/h | INTRAMUSCULAR | Status: DC
  Administered 2017-07-21 (×2): 950 [IU]/h via INTRAVENOUS
  Filled 2017-07-21 (×2): qty 250

## 2017-07-21 MED ORDER — ADULT MULTIVITAMIN W/MINERALS CH
1.0000 | ORAL_TABLET | Freq: Every day | ORAL | Status: DC
  Administered 2017-07-21 – 2017-07-22 (×2): 1 via ORAL
  Filled 2017-07-21 (×2): qty 1

## 2017-07-21 MED ORDER — ASPIRIN EC 81 MG PO TBEC
81.0000 mg | DELAYED_RELEASE_TABLET | Freq: Every day | ORAL | Status: DC
  Administered 2017-07-21: 81 mg via ORAL
  Filled 2017-07-21: qty 1

## 2017-07-21 MED ORDER — SODIUM CHLORIDE 0.9 % WEIGHT BASED INFUSION
3.0000 mL/kg/h | INTRAVENOUS | Status: DC
  Administered 2017-07-22: 3 mL/kg/h via INTRAVENOUS

## 2017-07-21 MED ORDER — SODIUM CHLORIDE 0.9% FLUSH: 3.0000 mL | Freq: Two times a day (BID) | INTRAVENOUS | Status: DC

## 2017-07-21 MED ORDER — HEPARIN SODIUM (PORCINE) 5000 UNIT/ML IJ SOLN
4000.0000 [IU] | Freq: Once | INTRAMUSCULAR | Status: AC
  Administered 2017-07-21: 4000 [IU] via INTRAVENOUS
  Filled 2017-07-21: qty 1

## 2017-07-21 MED ORDER — ADENOSINE 6 MG/2ML IV SOLN
6.0000 mg | Freq: Once | INTRAVENOUS | Status: AC
  Administered 2017-07-21: 6 mg via INTRAVENOUS

## 2017-07-21 MED ORDER — LORAZEPAM 1 MG PO TABS
1.0000 mg | ORAL_TABLET | Freq: Four times a day (QID) | ORAL | Status: DC | PRN
  Administered 2017-07-21 – 2017-07-22 (×5): 1 mg via ORAL
  Filled 2017-07-21 (×5): qty 1

## 2017-07-21 MED ORDER — ACETAMINOPHEN 325 MG PO TABS
650.0000 mg | ORAL_TABLET | ORAL | Status: DC | PRN
Start: 2017-07-21 — End: 2017-07-22

## 2017-07-21 MED ORDER — ONDANSETRON HCL 4 MG/2ML IJ SOLN
4.0000 mg | Freq: Once | INTRAMUSCULAR | Status: AC | PRN
  Administered 2017-07-21: 4 mg via INTRAVENOUS
  Filled 2017-07-21: qty 2

## 2017-07-21 MED ORDER — VITAMIN B-1 100 MG PO TABS
100.0000 mg | ORAL_TABLET | Freq: Every day | ORAL | Status: DC
  Administered 2017-07-21 – 2017-07-22 (×2): 100 mg via ORAL
  Filled 2017-07-21 (×2): qty 1

## 2017-07-21 MED ORDER — SODIUM CHLORIDE 0.9 % IV SOLN: INTRAVENOUS | Status: DC

## 2017-07-21 MED ORDER — DILTIAZEM HCL 100 MG IV SOLR
5.0000 mg/h | INTRAVENOUS | Status: DC
  Administered 2017-07-21 – 2017-07-22 (×3): 5 mg/h via INTRAVENOUS
  Filled 2017-07-21 (×3): qty 100

## 2017-07-21 MED ORDER — PNEUMOCOCCAL VAC POLYVALENT 25 MCG/0.5ML IJ INJ: 0.5000 mL | INJECTION | INTRAMUSCULAR | Status: DC

## 2017-07-21 MED ORDER — THIAMINE HCL 100 MG/ML IJ SOLN: 100.0000 mg | Freq: Every day | INTRAMUSCULAR | Status: DC

## 2017-07-21 MED ORDER — INFLUENZA VAC SPLIT QUAD 0.5 ML IM SUSY: 0.5000 mL | PREFILLED_SYRINGE | INTRAMUSCULAR | Status: DC

## 2017-07-21 MED ORDER — ASPIRIN 81 MG PO CHEW
81.0000 mg | CHEWABLE_TABLET | ORAL | Status: AC
  Administered 2017-07-22: 81 mg via ORAL
  Filled 2017-07-21: qty 1

## 2017-07-21 MED ORDER — THIAMINE HCL 100 MG/ML IJ SOLN
Freq: Once | INTRAVENOUS | Status: DC
  Filled 2017-07-21: qty 1000

## 2017-07-21 MED ORDER — LORAZEPAM 2 MG/ML IJ SOLN
1.0000 mg | Freq: Once | INTRAMUSCULAR | Status: AC
  Administered 2017-07-21: 1 mg via INTRAVENOUS
  Filled 2017-07-21: qty 1

## 2017-07-21 NOTE — Progress Notes (Signed)
ANTICOAGULATION CONSULT NOTE   Pharmacy Consult for heparin  Indication: atrial fibrillation  No Known Allergies  Patient Measurements: Height:  (175.3 cm) Weight: 165 lb 2 oz (74.9 kg) IBW/kg (Calculated) : 70.7 Heparin Dosing Weight: 76.2 kg   Vital Signs: Temp: 98 F (36.7 C) (09/24 0910) Temp Source: Oral (09/24 0910) BP: 149/70 (09/24 0910) Pulse Rate: 72 (09/24 0910)  Labs:  Recent Labs  07/21/17 0150 07/21/17 0159 07/21/17 0447 07/21/17 1112  HGB 14.7  --   --   --   HCT 43.1  --   --   --   PLT 307  --   --   --   APTT  --  24  --   --   LABPROT  --  12.6  --   --   INR  --  0.95  --   --   HEPARINUNFRC  --   --   --  0.18*  CREATININE 0.90  --  0.80  --   TROPONINI  --  0.16* 0.21*  --     Estimated Creatinine Clearance: 93.3 mL/min (by C-G formula based on SCr of 0.8 mg/dL).   Medical History: Past Medical History:  Diagnosis Date  . Blood in stool   . Diverticulitis   . ETOH abuse   . GERD (gastroesophageal reflux disease)   . Heart murmur   . History of urinary tract infection   . Hypertension   . IBS (irritable bowel syndrome)   . Migraine    "none in a long time" (03/14/2016)  . Nocturia   . Pneumonia    childhood  . Seasonal allergies   . Tobacco abuse    Assessment: 66 yo male admitted with chest pain on heparin for r/o ACS and afib.  No oral anticoagulation PTA.  -Heparin level = 0.18 on 950 units/hr   Goal of Therapy:  Heparin level 0.3-0.7 units/ml Monitor platelets by anticoagulation protocol: Yes   Plan:  Heparin bolus 2000 units x1 Increase heparin to 1150 units/hr Heparin level in 6 hrs Daily heparin level and CBC  Harland German, Pharm D 07/21/2017 12:19 PM

## 2017-07-21 NOTE — ED Provider Notes (Signed)
[facility]-EMERGENCY DEPT Provider Note   CSN: [id number] Arrival date & time: 07/21/17  0130     History   Chief Complaint Chief Complaint  Patient presents with  . Nausea  . Emesis  . Chest Pain  LEVEL 5 CAVEAT DUE TO ACUITY OF CONDITION   HPI Alvin Benton is a 66 y.o. male.  The history is provided by the patient and the EMS personnel. The history is limited by the condition of the patient.  Chest Pain   This is a new problem. The current episode started 3 to 5 hours ago. The problem occurs constantly. The problem has been gradually improving. The pain is present in the substernal region. The pain is severe. The quality of the pain is described as pressure-like. The pain does not radiate. Associated symptoms include diaphoresis, shortness of breath and vomiting. He has tried nitroglycerin for the symptoms. The treatment provided moderate relief.  PT REPORTS "FEELING SICK" FOR PAST SEVERAL DAYS WITH NAUSEA/VOMITING TONIGHT HE WOKE UP WITH CHEST PRESSURE/SHORTNESS OF BREATH.  THIS HAS BEEN PRESENT FOR UP TO 3 HRS EMS GAVE PATIENT NTG WITH SOME IMPROVEMENT DENIES KNOWN H/O CAD HE DENIES ETOH USE  Past Medical History:  Diagnosis Date  . Blood in stool   . Diverticulitis   . ETOH abuse   . GERD (gastroesophageal reflux disease)   . Heart murmur   . History of urinary tract infection   . Hypertension   . IBS (irritable bowel syndrome)   . Migraine    "none in a long time" (03/14/2016)  . Nocturia   . Pneumonia    childhood  . Seasonal allergies   . Tobacco abuse     Patient Active Problem List   Diagnosis Date Noted  . PNA (pneumonia): pROBABLE ASPIRATION 05/21/2017  . Hyponatremia 05/20/2017  . Dehydration 05/20/2017  . Syncope 02/26/2017  . History of atrial fibrillation 02/26/2017  . Right flank hematoma 02/26/2017  . Zygomatic fracture, right side, initial encounter for closed fracture (HCC) 02/26/2017  . Multiple rib fractures 02/26/2017  . Acute pulmonary edema  (HCC)   . Dizziness   . Fall   . Dehydration with hyponatremia 02/20/2017  . Alcohol dependence in early full remission (HCC) 06/04/2016  . Volume depletion 06/04/2016  . Hypokalemia 06/04/2016  . Diverticulitis of intestine with perforation without bleeding 06/04/2016  . Essential hypertension   . Hypomagnesemia   . Ileus (HCC) 05/05/2016  . Systolic murmur 05/05/2016  . Anxiety state 04/19/2016  . 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 Hypoxia 04/05/2016  . Atrial fibrillation (HCC) 04/05/2016  . Diverticulitis of large intestine with perforation and abscess 03/14/2016  . Chronic constipation 03/08/2016  . Alcohol abuse 02/15/2016  . Tobacco use disorder 07/17/2015  . Allergic rhinitis 07/17/2015  . Reactive airway disease with wheezing 07/17/2015  . Overweight (BMI 25.0-29.9) 07/17/2015  . Hand pain, left 01/12/2014  . LLQ pain-chronic, intermittent and episodic 09/17/2013    Past Surgical History:  Procedure Laterality Date  . COLONOSCOPY    . CYSTOSCOPY WITH STENT PLACEMENT Left 10/03/2016   Procedure: CYSTOSCOPY WITH  LEFT URETERAL CATHETER PLACEMENT;  Surgeon: Bjorn Pippin, MD;  Location: [facility];  Service: Urology;  Laterality: Left;  . PROCTOSCOPY  10/03/2016   Procedure: PROCTOSCOPY;  Surgeon: Romie Levee, MD;  Location: [facility];  Service: General;;  . TONSILLECTOMY         Home Medications    Prior to Admission medications   Medication Sig Start Date End Date Taking? Authorizing  Provider  carbamazepine (TEGRETOL) 100 MG chewable tablet Chew 100 mg by mouth as directed.    [provider]  irbesartan (AVAPRO) 150 MG tablet Take 1 tablet (150 mg total) by mouth daily. 06/06/17   Osvaldo Shipper, MD  thiamine 100 MG tablet Take 1 tablet (100 mg total) by mouth daily. 03/04/17   Leroy Sea, MD    Family History Family History  Problem Relation Age of Onset  . Stroke Mother   . Colon cancer Neg Hx   . Esophageal cancer Neg Hx   . Rectal cancer Neg Hx     . Stomach cancer Neg Hx     Social History Social History  Substance Use Topics  . Smoking status: Current Every Day Smoker    Packs/day: 1.00    Years: 40.00    Types: Cigarettes  . Smokeless tobacco: Never Used  . Alcohol use 1.8 oz/week    3 Shots of liquor per week     Comment: pt states does not drink daily has beer with football     Allergies   Patient has no known allergies.   Review of Systems Review of Systems  Unable to perform ROS: Acuity of condition  Constitutional: Positive for diaphoresis.  Respiratory: Positive for shortness of breath.   Cardiovascular: Positive for chest pain.  Gastrointestinal: Positive for vomiting.     Physical Exam Updated Vital Signs BP (!) 178/119 (BP Location: Right Arm)   Pulse (!) 152   Temp 98.2 F (36.8 C) (Oral)   Resp 19   Ht 1.753 m ( )   Wt 76.2 kg (168 lb)   SpO2 96%   BMI 24.81 kg/m   Physical Exam CONSTITUTIONAL: disheveled, ill appearing, diaphoretic HEAD: Normocephalic/atraumatic EYES: EOMI/PERRL ENMT: Mucous membranes moist NECK: supple no meningeal signs SPINE/BACK:entire spine nontender CV: tachycardic, no loud murmurs LUNGS: Lungs are clear to auscultation bilaterally, no apparent distress ABDOMEN: soft, nontender  GU:no cva tenderness NEURO: Pt is awake/alert/appropriate, moves all extremitiesx4.  No facial droop.   EXTREMITIES: pulses normal/equal, full ROM SKIN: warm, color normal PSYCH: no abnormalities of mood noted, alert and oriented to situation   ED Treatments / Results  Labs (all labs ordered are listed, but only abnormal results are displayed) Labs Reviewed  COMPREHENSIVE METABOLIC PANEL - Abnormal; Notable for the following:       Result Value   Chloride 97 (*)    CO2 17 (*)    Glucose, Bld 156 (*)    AST 60 (*)    Total Bilirubin 3.0 (*)    Anion gap 22 (*)    All other components within normal limits  CBC - Abnormal; Notable for the following:    WBC 10.6 (*)     All other components within normal limits  TROPONIN I - Abnormal; Notable for the following:    Troponin I 0.16 (*)    All other components within normal limits  LIPID PANEL - Abnormal; Notable for the following:    Cholesterol 262 (*)    LDL Cholesterol 114 (*)    All other components within normal limits  LIPASE, BLOOD  PROTIME-INR  APTT  ETHANOL  URINALYSIS, ROUTINE W REFLEX MICROSCOPIC    EKG  EKG Interpretation  Date/Time:  Monday July 21 2017 01:38:15 EDT Ventricular Rate:  151 PR Interval:    QRS Duration: 133 QT Interval:  342 QTC Calculation: 543 R Axis:   35 Text Interpretation:  Sinus tachycardia Nonspecific intraventricular conduction delay Inferior  infarct, acute (LCx) Anteroseptal infarct, old Abnormal ekg Confirmed by Zadie Rhine ([id number]) on 07/21/2017 1:49:18 AM       Radiology Dg Chest Port 1 View  Result Date: 07/21/2017 CLINICAL DATA:  Nausea and vomiting x3 days. Chest tightness x2 hours. EXAM: PORTABLE CHEST 1 VIEW COMPARISON:  05/20/2017 FINDINGS: The heart size and mediastinal contours are within normal limits. Both lungs are clear. Right chronic third through sixth rib fractures. Right AC joint separation is excluded on this exam. No new osseous appearing abnormalities. No free air beneath the diaphragm. IMPRESSION: No active disease. Electronically Signed   By: Tollie Eth M.D.   On: 07/21/2017 02:17    Procedures .Cardioversion Date/Time: 07/21/2017 2:12 AM Performed by: Zadie Rhine Authorized by: Zadie Rhine   Consent:    Consent obtained:  Emergent situation   Consent given by:  Patient Post-procedure details:    Patient status:  Alert   Patient tolerance of procedure:  Tolerated well, no immediate complications Comments:     Patient one dose of adenosine for chemical cardioversion This was done emergently due to need to determine underlying rhythm or if he had STEMI He was noted to be in atrial flutter Pt tolerated  well     CRITICAL CARE Performed by: Joya Gaskins Total critical care time: 45 minutes Critical care time was exclusive of separately billable procedures and treating other patients. Critical care was necessary to treat or prevent imminent or life-threatening deterioration. Critical care was time spent personally by me on the following activities: development of treatment plan with patient and/or surrogate as well as nursing, discussions with consultants, evaluation of patient's response to treatment, examination of patient, obtaining history from patient or surrogate, ordering and performing treatments and interventions, ordering and review of laboratory studies, ordering and review of radiographic studies, pulse oximetry and re-evaluation of patient's condition.   Medications Ordered in ED Medications  0.9 %  sodium chloride infusion (not administered)  diltiazem (CARDIZEM) 1 mg/mL load via infusion 20 mg (20 mg Intravenous Bolus from Bag 07/21/17 0308)    And  diltiazem (CARDIZEM) 100 mg in dextrose 5 % 100 mL (1 mg/mL) infusion (7.5 mg/hr Intravenous Rate/Dose Change 07/21/17 0348)  ondansetron (ZOFRAN) injection 4 mg (4 mg Intravenous Given 07/21/17 0159)  aspirin chewable tablet 324 mg (324 mg Oral Given 07/21/17 0202)  heparin injection 4,000 Units (4,000 Units Intravenous Given 07/21/17 0202)  sodium chloride 0.9 % bolus 1,000 mL (0 mLs Intravenous Stopped 07/21/17 0423)  LORazepam (ATIVAN) injection 1 mg (1 mg Intravenous Given 07/21/17 0201)  adenosine (ADENOCARD) 6 MG/2ML injection 6 mg (6 mg Intravenous Given 07/21/17 0212)  sodium chloride 0.9 % bolus 1,000 mL (1,000 mLs Intravenous New Bag/Given 07/21/17 0423)     Initial Impression / Assessment and Plan / ED Course  I have reviewed the triage vital signs and the nursing notes.  Pertinent labs & imaging results that were available during my care of the patient were reviewed by me and considered in my medical decision making  (see chart for details).     2:02 AM Pt presents with chest pressure/SOB He appears to have new elevation in inferior leads Code stemi called D/w dr Clifton James via phone after I activated Code STEMI 2:16 AM Seen with cardiology fellow dr Daphine Deutscher We gave patient adenosine and this appears to be flutter type pattern CODE stemi cancelled 4:41 AM Pt improved HR improving Pt is unreliable, unclear time of onset of a-flutter, will defer electrical cardioversion Will need  admission, IV fluids Suspect ETOH withdrawal as driving force (vomiting for 2-3 day without able to drink ETOH) He initially denied ETOH use but then stated "I had one beer"  Will admit to medicine D/w dr gardner for admission  Pt improved in the ED He denies CP at this time   Final Clinical Impressions(s) / ED Diagnoses   Final diagnoses:  Atrial flutter with rapid ventricular response (HCC)  Dehydration    New Prescriptions New Prescriptions   No medications on file     Zadie Rhine, MD 07/21/17 [phone]

## 2017-07-21 NOTE — ED Triage Notes (Signed)
Pt BIB [facility] for nausea and vomiting. Been going on for 3 days cannot keep anything down. Pt started having chest tightness 2 hours pta.  zofran odt given and 2 nitro.

## 2017-07-21 NOTE — Progress Notes (Addendum)
Patient seen and examined. admitted after midnight secondary to abd pain, nausea, vomiting and dehydration. Patient found on atrial flutter with RVR (which responded to adenosine and cardizem drip). Had hx of alcohol abuse and last drink approx 3 days prior to admission after he got sick; ?? If flutter triggered by withdrawal. Patient is hemodynamically stable and denies CP, SOB, nausea and vomiting currently.  Please refer to H&P written by Dr. Julian Reil for further info/details on admission.  Plan: -will advance diet to full liquid diet -will cycle troponin -will check 2-D echo -follow and replete electrolytes -continue IVF's -PRN antiemetics -continue CIWA protocol  -cardiology consulted  Vassie Loll MD [phone]

## 2017-07-21 NOTE — Progress Notes (Addendum)
ANTICOAGULATION CONSULT NOTE - Initial Consult  Pharmacy Consult for heparin  Indication: atrial fibrillation  No Known Allergies  Patient Measurements: Height:  (175.3 cm) Weight: 168 lb (76.2 kg) IBW/kg (Calculated) : 70.7 Heparin Dosing Weight: 76.2 kg   Vital Signs: Temp: 98.2 F (36.8 C) (09/24 0136) Temp Source: Oral (09/24 0136) BP: 147/92 (09/24 0400) Pulse Rate: 152 (09/24 0400)  Labs:  Recent Labs  07/21/17 0150 07/21/17 0159  HGB 14.7  --   HCT 43.1  --   PLT 307  --   APTT  --  24  LABPROT  --  12.6  INR  --  0.95  CREATININE 0.90  --   TROPONINI  --  0.16*    Estimated Creatinine Clearance: 82.9 mL/min (by C-G formula based on SCr of 0.9 mg/dL).   Medical History: Past Medical History:  Diagnosis Date  . Blood in stool   . Diverticulitis   . ETOH abuse   . GERD (gastroesophageal reflux disease)   . Heart murmur   . History of urinary tract infection   . Hypertension   . IBS (irritable bowel syndrome)   . Migraine    "none in a long time" (03/14/2016)  . Nocturia   . Pneumonia    childhood  . Seasonal allergies   . Tobacco abuse    Assessment: 66 yo male admitted with chest pain/nausea/vomitting, originally code STEMI which was later cancelled. Pharmacy consulted to dose heparin for afib. Noted history of ETOH abuse.   No oral anticoagulation PTA. CBC stable and no s/s bleeding.   Patient received heparin bolus 4000 units IV x1 in ED at 2AM, so will not re-bolus at this time.   Goal of Therapy:  Heparin level 0.3-0.7 units/ml Monitor platelets by anticoagulation protocol: Yes   Plan:  Start heparin gtt at 950 units/hr Heparin level in 6 hrs Daily heparin level and CBC Monitor for s/s bleeding  York Cerise, PharmD Clinical Pharmacist 07/21/17 5:04 AM

## 2017-07-21 NOTE — H&P (Signed)
History and Physical    Rohail Klees ZOX:[id number] DOB: 1951-06-09 DOA: 07/21/2017  PCP: Marilynn Rail, MD  Patient coming from: Home  I have personally briefly reviewed patient's old medical records in [facility]  Chief Complaint: Chest pain, N/V  HPI: Alvin Benton is a 66 y.o. male with medical history significant of EtOH abuse, A.Fib.  Patient reports that he developed N/V ~3 days ago.  Not able to keep anything down (including EtOH), this has been persistent and unchanged.  3 hours ago he woke up with new onset chest pressure and SOB.  Has been present and persistent since onset.  EMS gave NTG with some improvement.  No known CAD in past.  Patient also got ASA 325.  Patient brought in to ED.   ED Course: EKG shows A.Flutter with a rate of 147 in a 2:1 conduction pattern.  Trop 0.16, EtOH of 0.  BMP anion gap of 22.  UA shows 80 keytones.  BGL 150.  EDP spoke with cards fellow who recommended cardioversion; however, EDP didn't feel that this was wise as we couldn't be sure how long he was in A.Flutter for.  EDP has requested admission.   Review of Systems: As per HPI otherwise 10 point review of systems negative.   Past Medical History:  Diagnosis Date  . Blood in stool   . Diverticulitis   . ETOH abuse   . GERD (gastroesophageal reflux disease)   . Heart murmur   . History of urinary tract infection   . Hypertension   . IBS (irritable bowel syndrome)   . Migraine    "none in a long time" (03/14/2016)  . Nocturia   . Pneumonia    childhood  . Seasonal allergies   . Tobacco abuse     Past Surgical History:  Procedure Laterality Date  . COLONOSCOPY    . CYSTOSCOPY WITH STENT PLACEMENT Left 10/03/2016   Procedure: CYSTOSCOPY WITH  LEFT URETERAL CATHETER PLACEMENT;  Surgeon: Bjorn Pippin, MD;  Location: [facility];  Service: Urology;  Laterality: Left;  . PROCTOSCOPY  10/03/2016   Procedure: PROCTOSCOPY;  Surgeon: Romie Levee, MD;  Location: [facility];  Service:  General;;  . TONSILLECTOMY       reports that he has been smoking Cigarettes.  He has a 40.00 pack-year smoking history. He has never used smokeless tobacco. He reports that he drinks about 1.8 oz of alcohol per week . He reports that he does not use drugs.  No Known Allergies  Family History  Problem Relation Age of Onset  . Stroke Mother   . Colon cancer Neg Hx   . Esophageal cancer Neg Hx   . Rectal cancer Neg Hx   . Stomach cancer Neg Hx      Prior to Admission medications   Medication Sig Start Date End Date Taking? Authorizing Provider  hydrochlorothiazide (HYDRODIURIL) 25 MG tablet Take 25 mg by mouth daily. 06/25/17  Yes [provider]  irbesartan (AVAPRO) 150 MG tablet Take 1 tablet (150 mg total) by mouth daily. 06/06/17  Yes Osvaldo Shipper, MD  thiamine 100 MG tablet Take 1 tablet (100 mg total) by mouth daily. Patient not taking: Reported on 07/21/2017 03/04/17   Leroy Sea, MD    Physical Exam: Vitals:   07/21/17 0315 07/21/17 0330 07/21/17 0345 07/21/17 0400  BP: 127/73 137/74 (!) 144/92 (!) 147/92  Pulse: (!) 111 (!) 154 (!) 138 (!) 152  Resp: 18 (!) 21 (!) 22 20  Temp:      TempSrc:      SpO2: 97% 95% 97% 99%  Weight:      Height:        Constitutional: NAD, calm, comfortable Eyes: PERRL, lids and conjunctivae normal ENMT: Mucous membranes are moist. Posterior pharynx clear of any exudate or lesions.Normal dentition.  Neck: normal, supple, no masses, no thyromegaly Respiratory: clear to auscultation bilaterally, no wheezing, no crackles. Normal respiratory effort. No accessory muscle use.  Cardiovascular: Regular rate and rhythm, no murmurs / rubs / gallops. No extremity edema. 2+ pedal pulses. No carotid bruits.  Abdomen: no tenderness, no masses palpated. No hepatosplenomegaly. Bowel sounds positive.  Musculoskeletal: no clubbing / cyanosis. No joint deformity upper and lower extremities. Good ROM, no contractures. Normal muscle tone.    Skin: no rashes, lesions, ulcers. No induration Neurologic: CN 2-12 grossly intact. Sensation intact, DTR normal. Strength 5/5 in all 4.  Psychiatric: Normal judgment and insight. Alert and oriented x 3. Normal mood.    Labs on Admission: I have personally reviewed following labs and imaging studies  CBC:  Recent Labs Lab 07/21/17 0150  WBC 10.6*  HGB 14.7  HCT 43.1  MCV 92.3  PLT 307   Basic Metabolic Panel:  Recent Labs Lab 07/21/17 0150  NA 136  K 4.4  CL 97*  CO2 17*  GLUCOSE 156*  BUN 10  CREATININE 0.90  CALCIUM 9.6   GFR: Estimated Creatinine Clearance: 82.9 mL/min (by C-G formula based on SCr of 0.9 mg/dL). Liver Function Tests:  Recent Labs Lab 07/21/17 0150  AST 60*  ALT 39  ALKPHOS 52  BILITOT 3.0*  PROT 7.5  ALBUMIN 4.3    Recent Labs Lab 07/21/17 0150  LIPASE 32   No results for input(s): AMMONIA in the last 168 hours. Coagulation Profile:  Recent Labs Lab 07/21/17 0159  INR 0.95   Cardiac Enzymes:  Recent Labs Lab 07/21/17 0159  TROPONINI 0.16*   BNP (last 3 results) No results for input(s): PROBNP in the last 8760 hours. HbA1C: No results for input(s): HGBA1C in the last 72 hours. CBG: No results for input(s): GLUCAP in the last 168 hours. Lipid Profile:  Recent Labs  07/21/17 0159  CHOL 262*  HDL 130  LDLCALC 114*  TRIG 92  CHOLHDL 2.0   Thyroid Function Tests: No results for input(s): TSH, T4TOTAL, FREET4, T3FREE, THYROIDAB in the last 72 hours. Anemia Panel: No results for input(s): VITAMINB12, FOLATE, FERRITIN, TIBC, IRON, RETICCTPCT in the last 72 hours. Urine analysis:    Component Value Date/Time   COLORURINE YELLOW 07/21/2017 0425   APPEARANCEUR CLEAR 07/21/2017 0425   LABSPEC 1.020 07/21/2017 0425   PHURINE 5.0 07/21/2017 0425   GLUCOSEU 50 (A) 07/21/2017 0425   HGBUR SMALL (A) 07/21/2017 0425   BILIRUBINUR NEGATIVE 07/21/2017 0425   KETONESUR 80 (A) 07/21/2017 0425   PROTEINUR 100 (A)  07/21/2017 0425   NITRITE NEGATIVE 07/21/2017 0425   LEUKOCYTESUR NEGATIVE 07/21/2017 0425    Radiological Exams on Admission: Dg Chest Port 1 View  Result Date: 07/21/2017 CLINICAL DATA:  Nausea and vomiting x3 days. Chest tightness x2 hours. EXAM: PORTABLE CHEST 1 VIEW COMPARISON:  05/20/2017 FINDINGS: The heart size and mediastinal contours are within normal limits. Both lungs are clear. Right chronic third through sixth rib fractures. Right AC joint separation is excluded on this exam. No new osseous appearing abnormalities. No free air beneath the diaphragm. IMPRESSION: No active disease. Electronically Signed   By: Tollie Eth  M.D.   On: 07/21/2017 02:17    EKG: Independently reviewed.  Assessment/Plan Principal Problem:   Atrial fibrillation (HCC) Active Problems:   Alcohol abuse   Nausea & vomiting   Atrial fibrillation with RVR (HCC)   Alcoholic ketoacidosis    1. A.Flutter - 1. Rate controlled with cardizem gtt currently 2. A.Fib/flutter pathway 3. Convert to PO cardizem when recommended per pathway 4. Heparin gtt 5. Follow up with cards today about rate vs rhythm control long term in this patient 6. Serial trops for likely demand ischemia 7. Tele monitor 2. Anion gap metabolic acidosis - looks like ketoacidosis from EtOH and/or starvation from the N/V based on keytones in urine. 1. Lactic acid stat to rule this out as cause 2. Got 2L bolus of NS in ED. 3. D5 half NS at 75 cc/hr 4. Repeat BMP now then Q4H till AG closed 5. BGL 150, no known h/o DM 3. Nausea and vomiting - EtOH gastritis vs infectious gastritis vs due to acidosis 1. Zofran PRN 2. Try to advance to clear liquids when AG closed above 4. EtOH abuse - 1. CIWA 2. Patient denies withdrawal symptoms right now  DVT prophylaxis: Heparin gtt Code Status: Full Family Communication: No family in room Disposition Plan: Home after admit Consults called: EDP spoke with cardiology fellow Admission status:  Admit to inpatient   Hillary Bow DO [facility] Pager [phone]  If 7AM-7PM, please contact day team taking care of patient [email or web address] Password [facility]  07/21/2017, 4:54 AM

## 2017-07-21 NOTE — ED Notes (Signed)
Attempted to call report x 1  

## 2017-07-21 NOTE — ED Notes (Signed)
Dr. Bebe Shaggy notified of Troponin of 0.16.

## 2017-07-21 NOTE — Progress Notes (Signed)
ANTICOAGULATION CONSULT NOTE   Pharmacy Consult for heparin  Indication: atrial fibrillation  No Known Allergies  Patient Measurements: Height:  (175.3 cm) Weight: 165 lb 2 oz (74.9 kg) IBW/kg (Calculated) : 70.7 Heparin Dosing Weight: 76.2 kg   Vital Signs: Temp: 98.5 F (36.9 C) (09/24 1610) Temp Source: Axillary (09/24 1610) BP: 138/80 (09/24 1610) Pulse Rate: 83 (09/24 1610)  Labs:  Recent Labs  07/21/17 0150  07/21/17 0159 07/21/17 0447 07/21/17 1112 07/21/17 1719 07/21/17 1840  HGB 14.7  --   --   --   --   --   --   HCT 43.1  --   --   --   --   --   --   PLT 307  --   --   --   --   --   --   APTT  --   --  24  --   --   --   --   LABPROT  --   --  12.6  --   --   --   --   INR  --   --  0.95  --   --   --   --   HEPARINUNFRC  --   --   --   --  0.18*  --  0.36  CREATININE 0.90  --   --  0.80  --   --   --   TROPONINI  --   < > 0.16* 0.21* 0.18* 0.12*  --   < > = values in this interval not displayed.  Estimated Creatinine Clearance: 93.3 mL/min (by C-G formula based on SCr of 0.8 mg/dL).   Medical History: Past Medical History:  Diagnosis Date  . Blood in stool   . Diverticulitis   . ETOH abuse   . GERD (gastroesophageal reflux disease)   . Heart murmur   . History of urinary tract infection   . Hypertension   . IBS (irritable bowel syndrome)   . Migraine    "none in a long time" (03/14/2016)  . Nocturia   . Pneumonia    childhood  . Seasonal allergies   . Tobacco abuse    Assessment: 66 yo male admitted with chest pain on heparin for r/o ACS and afib.  No oral anticoagulation PTA. Heparin level had been subtherapeutic at 0.18 on 950 units/hr. Patient received at bolus and rate was increased to 1150 units/hr. Heparin level came back within goal range at 0.36. No signs/symptoms of bleeding noted.   Goal of Therapy:  Heparin level 0.3-0.7 units/ml Monitor platelets by anticoagulation protocol: Yes   Plan:  Continue heparin to 1150  units/hr Daily heparin level and CBC  Girard Cooter, PharmD Clinical Pharmacist  Phone: [phone]

## 2017-07-21 NOTE — Consult Note (Signed)
Cardiology Consultation:   Patient ID: Alvin Benton; [id number]; Feb 11, 1951   Admit date: 07/21/2017 Date of Consult: 07/21/2017  Primary Care Provider: Marilynn Rail, MD Primary Cardiologist: New   Patient Profile:   Alvin Benton is a 66 y.o. male with a hx of ETOH abuse, GERD, HTN, AS and AI and tobacco abuse who is being seen today for the evaluation of new atrial flutter at the request of  Dr. Gwenlyn Perking, Internal Medicine.  History of Present Illness:   Alvin Benton has a notable history of ETOH abuse/dependence. He has had multiple admissions for ETOH intoxication. In May, he was admitted for a fall in the setting of intoxication. In August, he presented to the ED for ETOH detox. Per chart, his ETOH level was 79 at the time. He was admitted for detox and observation on CIWA and discharged to [facility] for outpatient assistance. He has also recently had issues with hyponatremia, felt secondary to HCTZ. This was managed here at [facility] during previous admit last month.   Pt presented back to [facility] ED early morning hours with complaint of nausea and vomiting and decreased PO intake  x 3 days. He also noted chest pressure and shortness of breath, that started around midnight. Pt called EMS. He was given SL NTG w/o improvement. Pt was tachycardic in ED with rates in the 150s. Pt was given adenosine and Cardizem and rate slowed, demonstrating atrial flutter. Per ED note, he initially denied ETOH use but later stated "I had 1 beer". His ETOH level in ED was <5 mg/dL (normal). K was 4.4. Mg level pending. Renal function WNL. SCr 0.80. BUN 12. Na also normal at 135. Troponin level abnormal x 2 at 0.16>>0.21. Pt has been placed on IV Cardizem and IV heparin. He has had spontaneous conversion back to NSR. ? For cardiology is whether or not to anticoagulate as well as for recommendations regarding rate vs rhythm control.   CHA2DS2 VASc score is at least 1 for HTN. 2D echo pending. He has a murmur on exam  and states that he has had a murmur since he was a child. Prior echos have shown moderate AS and AI. He denies any previous h/o chest pressure or dyspnea, other than what he experienced last night. He denies any exertional symptoms with activity. He has never had a sleep study but notes he was told in the past that he occasionally snores.   Past Medical History:  Diagnosis Date  . Blood in stool   . Diverticulitis   . ETOH abuse   . GERD (gastroesophageal reflux disease)   . Heart murmur   . History of urinary tract infection   . Hypertension   . IBS (irritable bowel syndrome)   . Migraine    "none in a long time" (03/14/2016)  . Nocturia   . Pneumonia    childhood  . Seasonal allergies   . Tobacco abuse     Past Surgical History:  Procedure Laterality Date  . COLONOSCOPY    . CYSTOSCOPY WITH STENT PLACEMENT Left 10/03/2016   Procedure: CYSTOSCOPY WITH  LEFT URETERAL CATHETER PLACEMENT;  Surgeon: Bjorn Pippin, MD;  Location: [facility];  Service: Urology;  Laterality: Left;  . PROCTOSCOPY  10/03/2016   Procedure: PROCTOSCOPY;  Surgeon: Romie Levee, MD;  Location: [facility];  Service: General;;  . TONSILLECTOMY       Home Medications:  Prior to Admission medications   Medication Sig Start Date End Date Taking? Authorizing Provider  hydrochlorothiazide (  HYDRODIURIL) 25 MG tablet Take 25 mg by mouth daily. 06/25/17  Yes [provider]  irbesartan (AVAPRO) 150 MG tablet Take 1 tablet (150 mg total) by mouth daily. 06/06/17  Yes Osvaldo Shipper, MD  thiamine 100 MG tablet Take 1 tablet (100 mg total) by mouth daily. Patient not taking: Reported on 07/21/2017 03/04/17   Leroy Sea, MD    Inpatient Medications: Scheduled Meds: . aspirin EC  81 mg Oral Daily  . folic acid  1 mg Oral Daily  . [START ON 07/22/2017] Influenza vac split quadrivalent PF  0.5 mL Intramuscular Tomorrow-1000  . multivitamin with minerals  1 tablet Oral Daily  . [START ON 07/22/2017] pneumococcal 23  valent vaccine  0.5 mL Intramuscular Tomorrow-1000  . thiamine  100 mg Oral Daily   Or  . thiamine  100 mg Intravenous Daily   Continuous Infusions: . sodium chloride    . sodium chloride 75 mL/hr (07/21/17 0857)  . diltiazem (CARDIZEM) infusion 5 mg/hr (07/21/17 1021)  . heparin 950 Units/hr (07/21/17 0900)   PRN Meds: acetaminophen, LORazepam **OR** LORazepam, ondansetron (ZOFRAN) IV  Allergies:   No Known Allergies  Social History:   Social History   Social History  . Marital status: Single    Spouse name: N/A  . Number of children: 1  . Years of education: N/A   Occupational History  . Inventory  [employer]   Social History Main Topics  . Smoking status: Current Every Day Smoker    Packs/day: 1.00    Years: 40.00    Types: Cigarettes  . Smokeless tobacco: Never Used  . Alcohol use 1.8 oz/week    3 Shots of liquor per week     Comment: pt states does not drink daily has beer with football  . Drug use: No  . Sexual activity: Not Currently   Other Topics Concern  . Not on file   Social History Narrative   Area and one daughter   Regulatory affairs officer at [employer]   One caffeinated beverage a day    Family History:    Family History  Problem Relation Age of Onset  . Stroke Mother   . Colon cancer Neg Hx   . Esophageal cancer Neg Hx   . Rectal cancer Neg Hx   . Stomach cancer Neg Hx      ROS:  Please see the history of present illness.  ROS  All other ROS reviewed and negative.     Physical Exam/Data:   Vitals:   07/21/17 0615 07/21/17 0700 07/21/17 0719 07/21/17 0910  BP: (!) 141/69 136/69 (!) 163/128 (!) 149/70  Pulse: 83 88 89 72  Resp: 20 16 (!) 25 19  Temp:    98 F (36.7 C)  TempSrc:    Oral  SpO2: 96% 96% 99% 100%  Weight:    165 lb 2 oz (74.9 kg)  Height:        Intake/Output Summary (Last 24 hours) at 07/21/17 1110 Last data filed at 07/21/17 0900  Gross per 24 hour  Intake             2510 ml  Output                 0 ml  Net             2510 ml   Filed Weights   07/21/17 0136 07/21/17 0910  Weight: 168 lb (76.2 kg) 165 lb 2 oz (74.9 kg)  Body mass index is 24.38 kg/m.  General:  Well nourished, well developed, in no acute distress HEENT: normal Lymph: no adenopathy Neck: no JVD Endocrine:  No thryomegaly Vascular: No carotid bruits; FA pulses 2+ bilaterally without bruits  Cardiac:  normal S1, S2; RRR; notable murmur on exam. 2-3/6Heard throughout the precordium and slight radiation to the carotids and left posterior back but loudest at the LLSB. Lungs:  clear to auscultation bilaterally, no wheezing, rhonchi or rales  Abd: soft, nontender, no hepatomegaly  Ext: no edema Musculoskeletal:  No deformities, BUE and BLE strength normal and equal Skin: warm and dry  Neuro:  CNs 2-12 intact, no focal abnormalities noted Psych:  Normal affect   EKG:  The EKG was personally reviewed and demonstrates:  Initial EKGs showed Atrial Flutter in the 150s>> conversion back to NSR after adenosine and Cardizem.  Telemetry:  Telemetry was personally reviewed and demonstrates:  NSR  Relevant CV Studies: 2D Echo pending   Laboratory Data:  Chemistry Recent Labs Lab 07/21/17 0150 07/21/17 0447  NA 136 135  K 4.4 4.4  CL 97* 100*  CO2 17* 20*  GLUCOSE 156* 94  BUN 10 12  CREATININE 0.90 0.80  CALCIUM 9.6 8.8*  GFRNONAA >60 >60  GFRAA >60 >60  ANIONGAP 22* 15     Recent Labs Lab 07/21/17 0150  PROT 7.5  ALBUMIN 4.3  AST 60*  ALT 39  ALKPHOS 52  BILITOT 3.0*   Hematology Recent Labs Lab 07/21/17 0150  WBC 10.6*  RBC 4.67  HGB 14.7  HCT 43.1  MCV 92.3  MCH 31.5  MCHC 34.1  RDW 13.7  PLT 307   Cardiac Enzymes Recent Labs Lab 07/21/17 0159 07/21/17 0447  TROPONINI 0.16* 0.21*   No results for input(s): TROPIPOC in the last 168 hours.  BNPNo results for input(s): BNP, PROBNP in the last 168 hours.  DDimer No results for input(s): DDIMER in the last 168  hours.  Radiology/Studies:  Dg Chest Port 1 View  Result Date: 07/21/2017 CLINICAL DATA:  Nausea and vomiting x3 days. Chest tightness x2 hours. EXAM: PORTABLE CHEST 1 VIEW COMPARISON:  05/20/2017 FINDINGS: The heart size and mediastinal contours are within normal limits. Both lungs are clear. Right chronic third through sixth rib fractures. Right AC joint separation is excluded on this exam. No new osseous appearing abnormalities. No free air beneath the diaphragm. IMPRESSION: No active disease. Electronically Signed   By: Tollie Eth M.D.   On: 07/21/2017 02:17    Assessment and Plan:   1. New Onset Atrial Flutter: initial EKGs showed atrial flutter in the 150s. Adenosine and IV Cardizem given in the ED, slowing rate and resulting in spontaneous conversion back to NSR. K WNL at 4.4. Pt with h/o ETOH use, thus will need to check Mg level and repletion of Mg if hypomagnesemia.  ETOH level checked in ED and was <5 mg/DL. SCr/BUN is WNL. CBC negative for anemia. WBC slightly abnormal at 10.6, however UA and CXR negative. He is afebrile. Troponin's are abnormal x 3 at 0.16>>0.21>>0.18, however this may be secondary to demand ischemia from rapid atrial flutter. 2D echo is pending to assess LVF and wall motion. Further ischemic w/u will depend on echo findings. If reduced LVEF or WMAs, we may need to consider LHC. If normal LVEF, we can opt for noninvasive NST as initial screening to assess for coronary disease. Also recommend checking TSH.   In terms of long term management, would recommend converting to  PO medication for rate control. If EF is normal, can consider long acting Cardizem. However if reduced EF, would recommend BB therapy instead. Avoidance of ETOH also important to reduce risk of recurrence.   For anticoagulation, his CHA2DS2 VASc score is at least 1 for HTN. I think his conversion back to NSR was spontaneous as adenosine dose not chemically convert afib/flutter. In addition, his symptom  onset was < 48 hrs (pt noted palpitations, chest pressure and dyspnea started shortly after midnight). However will discuss with MD if patient should still be on OAC, at least short term. We would have to use caution. He is at a high risk for bleeding given h/o ETOH abuse and history of falls 2/2 intoxication.   2.  Aortic Valve Disease: notable murmur on exam. 2-3/6. Heard throughout the precordium and slight radiation to the carotids and left posterior back but loudest at the LLSB. 2D echo pending. Will assess valve function. His last echo 04/2017 showed mild to moderate AS and moderate AI.   MD to follow with definitive recs.   For questions or updates, please contact [facility] Please consult [email or web address] for contact info under Cardiology/STEMI.   Signed, Robbie Lis, PA-C  07/21/2017 11:10 AM   The patient was seen, examined and discussed with Brittainy M. Sharol Harness, PA-C and I agree with the above.   66 year old male with etoh and tobacco dependence, admitted twice in July and August with DTs, hyponatremia and a fall in the setting of intoxication.  IHe came to the ED early morning of nausea and vomiting and decreased PO intake  x 3 days, after several hours of retching he developed severe chest pain. He woke up at midnight with palpitations and chest pain and came to the ER. He was found to be in atrial flutter with RVR, and started on cardizem drip that cardioverted him to SR. Currently in SR< total duration < 4 hours, on iv Heparin. CHA2DS2 VASc score is at least 1 for HTN. I would not start anticoagulation given the history of non-compliance and possible GIB with etoh abuse (esophageal varices rtc). Echocardiogram shows hyperdynamic LVEF, Aortic valve is thickened and possibly bicuspid. There is moderate aortic regurgitation and transaortic gradients consistent with severe aortic stenosis. These might be overestimated by high flow sec to aortic regurgitation. Troponin max 0.4, now  downtrending. We will plan for a left and right cath tomorrow.  [name], MD 07/21/2017

## 2017-07-21 NOTE — Progress Notes (Signed)
  Echocardiogram 2D Echocardiogram has been performed.  Alvin Benton 07/21/2017, 11:03 AM

## 2017-07-22 ENCOUNTER — Ambulatory Visit (HOSPITAL_COMMUNITY)
Admission: EM | Disposition: A | Discharge status: Home / Self Care | Payer: Self-pay | Source: Home / Self Care | Attending: Emergency Medicine

## 2017-07-22 ENCOUNTER — Encounter (HOSPITAL_COMMUNITY): Payer: Self-pay | Admitting: Cardiology

## 2017-07-22 DIAGNOSIS — E860 Dehydration: Secondary | ICD-10-CM | POA: Diagnosis not present

## 2017-07-22 DIAGNOSIS — I483 Typical atrial flutter: Secondary | ICD-10-CM | POA: Diagnosis not present

## 2017-07-22 DIAGNOSIS — F1010 Alcohol abuse, uncomplicated: Secondary | ICD-10-CM | POA: Diagnosis not present

## 2017-07-22 DIAGNOSIS — I4892 Unspecified atrial flutter: Secondary | ICD-10-CM | POA: Diagnosis not present

## 2017-07-22 DIAGNOSIS — R778 Other specified abnormalities of plasma proteins: Secondary | ICD-10-CM

## 2017-07-22 DIAGNOSIS — E872 Acidosis: Secondary | ICD-10-CM | POA: Diagnosis not present

## 2017-07-22 DIAGNOSIS — R7989 Other specified abnormal findings of blood chemistry: Secondary | ICD-10-CM

## 2017-07-22 DIAGNOSIS — I214 Non-ST elevation (NSTEMI) myocardial infarction: Secondary | ICD-10-CM | POA: Diagnosis not present

## 2017-07-22 DIAGNOSIS — R748 Abnormal levels of other serum enzymes: Secondary | ICD-10-CM | POA: Diagnosis not present

## 2017-07-22 DIAGNOSIS — I480 Paroxysmal atrial fibrillation: Secondary | ICD-10-CM

## 2017-07-22 HISTORY — PX: RIGHT/LEFT HEART CATH AND CORONARY ANGIOGRAPHY: CATH118266

## 2017-07-22 LAB — POCT ACTIVATED CLOTTING TIME: Activated Clotting Time: 164 seconds

## 2017-07-22 LAB — BASIC METABOLIC PANEL
ANION GAP: 8 (ref 5–15)
BUN: 10 mg/dL (ref 6–20)
CO2: 24 mmol/L (ref 22–32)
Calcium: 8.8 mg/dL — ABNORMAL LOW (ref 8.9–10.3)
Chloride: 104 mmol/L (ref 101–111)
Creatinine, Ser: 0.65 mg/dL (ref 0.61–1.24)
GFR calc Af Amer: >60 mL/min (ref >60)
GFR calc non Af Amer: >60 mL/min (ref >60)
GLUCOSE: 104 mg/dL — AB (ref 65–99)
POTASSIUM: 3.4 mmol/L — AB (ref 3.5–5.1)
Sodium: 136 mmol/L (ref 135–145)

## 2017-07-22 LAB — POCT I-STAT 3, VENOUS BLOOD GAS (G3P V)
BICARBONATE: 24.5 mmol/L (ref 20.0–28.0)
O2 Saturation: 61 %
TCO2: 26 mmol/L (ref 22–32)
pCO2, Ven: 39.1 mmHg — ABNORMAL LOW (ref 44.0–60.0)
pH, Ven: 7.405 (ref 7.250–7.430)
pO2, Ven: 32 mmHg (ref 32.0–45.0)

## 2017-07-22 LAB — CBC
HEMATOCRIT: 34.7 % — AB (ref 39.0–52.0)
Hemoglobin: 11.7 g/dL — ABNORMAL LOW (ref 13.0–17.0)
MCH: 31.5 pg (ref 26.0–34.0)
MCHC: 33.7 g/dL (ref 30.0–36.0)
MCV: 93.3 fL (ref 78.0–100.0)
PLATELETS: 211 10*3/uL (ref 150–400)
RBC: 3.72 MIL/uL — ABNORMAL LOW (ref 4.22–5.81)
RDW: 13.8 % (ref 11.5–15.5)
WBC: 6.5 10*3/uL (ref 4.0–10.5)

## 2017-07-22 LAB — POCT I-STAT 3, ART BLOOD GAS (G3+)
Acid-base deficit: 1 mmol/L (ref 0.0–2.0)
Bicarbonate: 23.3 mmol/L (ref 20.0–28.0)
O2 SAT: 96 %
TCO2: 24 mmol/L (ref 22–32)
pCO2 arterial: 35.3 mmHg (ref 32.0–48.0)
pH, Arterial: 7.427 (ref 7.350–7.450)
pO2, Arterial: 82 mmHg — ABNORMAL LOW (ref 83.0–108.0)

## 2017-07-22 LAB — HEPARIN LEVEL (UNFRACTIONATED): HEPARIN UNFRACTIONATED: 0.33 [IU]/mL (ref 0.30–0.70)

## 2017-07-22 SURGERY — RIGHT/LEFT HEART CATH AND CORONARY ANGIOGRAPHY
Anesthesia: LOCAL

## 2017-07-22 MED ORDER — FENTANYL CITRATE (PF) 100 MCG/2ML IJ SOLN
INTRAMUSCULAR | Status: DC | PRN
  Administered 2017-07-22 (×2): 25 ug via INTRAVENOUS

## 2017-07-22 MED ORDER — HEPARIN (PORCINE) IN NACL 2-0.9 UNIT/ML-% IJ SOLN
INTRAMUSCULAR | Status: AC | PRN
  Administered 2017-07-22: 1000 mL

## 2017-07-22 MED ORDER — MIDAZOLAM HCL 2 MG/2ML IJ SOLN
INTRAMUSCULAR | Status: DC | PRN
  Administered 2017-07-22 (×2): 1 mg via INTRAVENOUS

## 2017-07-22 MED ORDER — ASPIRIN 81 MG PO TBEC: 81.0000 mg | DELAYED_RELEASE_TABLET | Freq: Every day | ORAL | 0 refills | Status: AC

## 2017-07-22 MED ORDER — VERAPAMIL HCL 2.5 MG/ML IV SOLN
INTRAVENOUS | Status: AC
  Filled 2017-07-22: qty 2

## 2017-07-22 MED ORDER — LIDOCAINE HCL (PF) 1 % IJ SOLN
INTRAMUSCULAR | Status: DC | PRN
  Administered 2017-07-22: 15 mL via INTRADERMAL
  Administered 2017-07-22 (×2): 2 mL via INTRADERMAL

## 2017-07-22 MED ORDER — METOPROLOL TARTRATE 25 MG PO TABS: 25.0000 mg | ORAL_TABLET | Freq: Two times a day (BID) | ORAL | 0 refills | Status: DC

## 2017-07-22 MED ORDER — HEPARIN SODIUM (PORCINE) 1000 UNIT/ML IJ SOLN
INTRAMUSCULAR | Status: DC | PRN
  Administered 2017-07-22: 4000 [IU] via INTRAVENOUS

## 2017-07-22 MED ORDER — VERAPAMIL HCL 2.5 MG/ML IV SOLN
INTRAVENOUS | Status: DC | PRN
  Administered 2017-07-22: 10 mL via INTRA_ARTERIAL

## 2017-07-22 MED ORDER — POTASSIUM CHLORIDE CRYS ER 20 MEQ PO TBCR
40.0000 meq | EXTENDED_RELEASE_TABLET | Freq: Once | ORAL | Status: AC
  Administered 2017-07-22: 40 meq via ORAL
  Filled 2017-07-22: qty 2

## 2017-07-22 MED ORDER — SODIUM CHLORIDE 0.9% FLUSH: 3.0000 mL | INTRAVENOUS | Status: DC | PRN

## 2017-07-22 MED ORDER — LIDOCAINE HCL 2 % IJ SOLN
INTRAMUSCULAR | Status: AC
  Filled 2017-07-22: qty 10

## 2017-07-22 MED ORDER — CARVEDILOL 6.25 MG PO TABS: 6.2500 mg | ORAL_TABLET | Freq: Two times a day (BID) | ORAL | Status: DC

## 2017-07-22 MED ORDER — METOPROLOL TARTRATE 25 MG PO TABS
25.0000 mg | ORAL_TABLET | Freq: Two times a day (BID) | ORAL | Status: DC
  Administered 2017-07-22: 25 mg via ORAL
  Filled 2017-07-22: qty 1

## 2017-07-22 MED ORDER — ONDANSETRON HCL 4 MG PO TABS
4.0000 mg | ORAL_TABLET | Freq: Once | ORAL | Status: AC
  Administered 2017-07-22: 4 mg via ORAL
  Filled 2017-07-22: qty 1

## 2017-07-22 MED ORDER — SODIUM CHLORIDE 0.9 % IV SOLN: 250.0000 mL | INTRAVENOUS | Status: DC | PRN

## 2017-07-22 MED ORDER — HEPARIN (PORCINE) IN NACL 2-0.9 UNIT/ML-% IJ SOLN
INTRAMUSCULAR | Status: AC
  Filled 2017-07-22: qty 1000

## 2017-07-22 MED ORDER — SODIUM CHLORIDE 0.9 % WEIGHT BASED INFUSION: 1.0000 mL/kg/h | INTRAVENOUS | Status: AC

## 2017-07-22 MED ORDER — HEPARIN SODIUM (PORCINE) 5000 UNIT/ML IJ SOLN: 5000.0000 [IU] | Freq: Three times a day (TID) | INTRAMUSCULAR | Status: DC

## 2017-07-22 MED ORDER — IOPAMIDOL (ISOVUE-370) INJECTION 76%
INTRAVENOUS | Status: AC
  Filled 2017-07-22: qty 100

## 2017-07-22 MED ORDER — FENTANYL CITRATE (PF) 100 MCG/2ML IJ SOLN
INTRAMUSCULAR | Status: AC
  Filled 2017-07-22: qty 2

## 2017-07-22 MED ORDER — MIDAZOLAM HCL 2 MG/2ML IJ SOLN
INTRAMUSCULAR | Status: AC
  Filled 2017-07-22: qty 2

## 2017-07-22 MED ORDER — LORAZEPAM 1 MG PO TABS
1.0000 mg | ORAL_TABLET | Freq: Once | ORAL | Status: AC
  Administered 2017-07-22: 1 mg via ORAL
  Filled 2017-07-22: qty 1

## 2017-07-22 MED ORDER — HEPARIN SODIUM (PORCINE) 1000 UNIT/ML IJ SOLN
INTRAMUSCULAR | Status: AC
  Filled 2017-07-22: qty 1

## 2017-07-22 MED ORDER — IOPAMIDOL (ISOVUE-370) INJECTION 76%
INTRAVENOUS | Status: DC | PRN
  Administered 2017-07-22: 60 mL via INTRA_ARTERIAL

## 2017-07-22 MED ORDER — SODIUM CHLORIDE 0.9% FLUSH: 3.0000 mL | Freq: Two times a day (BID) | INTRAVENOUS | Status: DC

## 2017-07-22 SURGICAL SUPPLY — 14 items
CATH 5FR JL3.5 JR4 ANG PIG MP (CATHETERS) ×2 IMPLANT
CATH SWAN GANZ 7F STRAIGHT (CATHETERS) ×2 IMPLANT
DEVICE RAD COMP TR BAND LRG (VASCULAR PRODUCTS) ×2 IMPLANT
GLIDESHEATH SLEND SS 6F .021 (SHEATH) ×2 IMPLANT
GUIDEWIRE INQWIRE 1.5J.035X260 (WIRE) ×1 IMPLANT
INQWIRE 1.5J .035X260CM (WIRE) ×2
KIT HEART LEFT (KITS) ×2 IMPLANT
KIT HEART RIGHT NAMIC (KITS) ×2 IMPLANT
PACK CARDIAC CATHETERIZATION (CUSTOM PROCEDURE TRAY) ×2 IMPLANT
SHEATH GLIDE SLENDER 4/5FR (SHEATH) ×2 IMPLANT
SHEATH PINNACLE 7F 10CM (SHEATH) ×2 IMPLANT
SYR MEDRAD MARK V 150ML (SYRINGE) ×2 IMPLANT
TRANSDUCER W/STOPCOCK (MISCELLANEOUS) ×2 IMPLANT
TUBING CIL FLEX 10 FLL-RA (TUBING) ×2 IMPLANT

## 2017-07-22 NOTE — Discharge Instructions (Signed)
Heart-Healthy Eating Plan Heart-healthy meal planning includes:  Limiting unhealthy fats.  Increasing healthy fats.  Making other small dietary changes.  You may need to talk with your doctor or a diet specialist (dietitian) to create an eating plan that is right for you. What types of fat should I choose?  Choose healthy fats. These include olive oil and canola oil, flaxseeds, walnuts, almonds, and seeds.  Eat more omega-3 fats. These include salmon, mackerel, sardines, tuna, flaxseed oil, and ground flaxseeds. Try to eat fish at least twice each week.  Limit saturated fats. ? Saturated fats are often found in animal products, such as meats, butter, and cream. ? Plant sources of saturated fats include palm oil, palm kernel oil, and coconut oil.  Avoid foods with partially hydrogenated oils in them. These include stick margarine, some tub margarines, cookies, crackers, and other baked goods. These contain trans fats. What general guidelines do I need to follow?  Check food labels carefully. Identify foods with trans fats or high amounts of saturated fat.  Fill one half of your plate with vegetables and green salads. Eat 4-5 servings of vegetables per day. A serving of vegetables is: ? 1 cup of raw leafy vegetables. ?  cup of raw or cooked cut-up vegetables. ?  cup of vegetable juice.  Fill one fourth of your plate with whole grains. Look for the word "whole" as the first word in the ingredient list.  Fill one fourth of your plate with lean protein foods.  Eat 4-5 servings of fruit per day. A serving of fruit is: ? One medium whole fruit. ?  cup of dried fruit. ?  cup of fresh, frozen, or canned fruit. ?  cup of 100% fruit juice.  Eat more foods that contain soluble fiber. These include apples, broccoli, carrots, beans, peas, and barley. Try to get 20-30 g of fiber per day.  Eat more home-cooked food. Eat less restaurant, buffet, and fast food.  Limit or avoid  alcohol.  Limit foods high in starch and sugar.  Avoid fried foods.  Avoid frying your food. Try baking, boiling, grilling, or broiling it instead. You can also reduce fat by: ? Removing the skin from poultry. ? Removing all visible fats from meats. ? Skimming the fat off of stews, soups, and gravies before serving them. ? Steaming vegetables in water or broth.  Lose weight if you are overweight.  Eat 4-5 servings of nuts, legumes, and seeds per week: ? One serving of dried beans or legumes equals  cup after being cooked. ? One serving of nuts equals 1 ounces. ? One serving of seeds equals  ounce or one tablespoon.  You may need to keep track of how much salt or sodium you eat. This is especially true if you have high blood pressure. Talk with your doctor or dietitian to get more information. What foods can I eat? Grains Breads, including [place], white, pita, wheat, raisin, rye, oatmeal, and [place]. Tortillas that are neither fried nor made with lard or trans fat. Low-fat rolls, including hotdog and hamburger buns and English muffins. Biscuits. Muffins. Waffles. Pancakes. Light popcorn. Whole-grain cereals. Flatbread. Melba toast. Pretzels. Breadsticks. Rusks. Low-fat snacks. Low-fat crackers, including oyster, saltine, matzo, graham, animal, and rye. Rice and pasta, including brown rice and pastas that are made with whole wheat. Vegetables All vegetables. Fruits All fruits, but limit coconut. Meats and Other Protein Sources Lean, well-trimmed beef, veal, pork, and lamb. Chicken and [place] without skin. All fish and shellfish.  Wild duck, rabbit, pheasant, and venison. Egg whites or low-cholesterol egg substitutes. Dried beans, peas, lentils, and tofu. Seeds and most nuts. Dairy Low-fat or nonfat cheeses, including ricotta, string, and mozzarella. Skim or 1% milk that is liquid, powdered, or evaporated. Buttermilk that is made with low-fat milk. Nonfat or low-fat  yogurt. Beverages Mineral water. Diet carbonated beverages. Sweets and Desserts Sherbets and fruit ices. Honey, jam, marmalade, jelly, and syrups. Meringues and gelatins. Pure sugar candy, such as hard candy, jelly beans, gumdrops, mints, marshmallows, and small amounts of dark chocolate. [employer]. Eat all sweets and desserts in moderation. Fats and Oils Nonhydrogenated (trans-free) margarines. Vegetable oils, including soybean, sesame, sunflower, olive, peanut, safflower, corn, canola, and cottonseed. Salad dressings or mayonnaise made with a vegetable oil. Limit added fats and oils that you use for cooking, baking, salads, and as spreads. Other Cocoa powder. Coffee and tea. All seasonings and condiments. The items listed above may not be a complete list of recommended foods or beverages. Contact your dietitian for more options. What foods are not recommended? Grains Breads that are made with saturated or trans fats, oils, or whole milk. Croissants. Butter rolls. Cheese breads. Sweet rolls. Donuts. Buttered popcorn. Chow mein noodles. High-fat crackers, such as cheese or butter crackers. Meats and Other Protein Sources Fatty meats, such as hotdogs, short ribs, sausage, spareribs, bacon, rib eye roast or steak, and mutton. High-fat deli meats, such as salami and bologna. Caviar. Domestic duck and goose. Organ meats, such as kidney, liver, sweetbreads, and heart. Dairy Cream, sour cream, cream cheese, and creamed cottage cheese. Whole-milk cheeses, including blue (bleu), [address], [place], [place], [address], [place], [address], cheddar, [place], and [place]. Whole or 2% milk that is liquid, evaporated, or condensed. Whole buttermilk. Cream sauce or high-fat cheese sauce. Yogurt that is made from whole milk. Beverages Regular sodas and juice drinks with added sugar. Sweets and Desserts Frosting. Pudding. Cookies. Cakes other than angel food cake. Candy that has milk chocolate or white  chocolate, hydrogenated fat, butter, coconut, or unknown ingredients. Buttered syrups. Full-fat ice cream or ice cream drinks. Fats and Oils Gravy that has suet, meat fat, or shortening. Cocoa butter, hydrogenated oils, palm oil, coconut oil, palm kernel oil. These can often be found in baked products, candy, fried foods, nondairy creamers, and whipped toppings. Solid fats and shortenings, including bacon fat, salt pork, lard, and butter. Nondairy cream substitutes, such as coffee creamers and sour cream substitutes. Salad dressings that are made of unknown oils, cheese, or sour cream. The items listed above may not be a complete list of foods and beverages to avoid. Contact your dietitian for more information. This information is not intended to replace advice given to you by your health care provider. Make sure you discuss any questions you have with your health care provider. Document Released: 04/14/2012 Document Revised: 03/21/2016 Document Reviewed: 04/07/2014 Elsevier Interactive Patient Education  2018 [employer].    Finding Treatment for Addiction What is addiction? Addiction is a complex disease of the brain. It causes an uncontrollable (compulsive) need for a substance. You can be addicted to alcohol, illegal drugs, or prescription medicines such as painkillers. Addiction can also be a behavior, like gambling or shopping. The need for the drug or activity can become so strong that you think about it all the time. You can also become physically dependent on a substance. Addiction can change the way your brain works. Because of these changes, getting more of whatever you are addicted to becomes  the most important thing to you and feels better than other activities or relationships. Addiction can lead to changes in health, behavior, emotions, relationships, and choices that affect you and everyone around you. How do I know if I need treatment for addiction? Addiction is a progressive  disease. Without treatment, addiction can get worse. Living with addiction puts you at higher risk for injury, poor health, lost employment, loss of money, and even death. You might need treatment for addiction if:  You have tried to stop or cut down, but you cannot.  Your addiction is causing physical health problems.  You find it annoying that your friends and family are concerned about your alcohol or substance use.  You feel guilty about substance abuse or a compulsive behavior.  You have lied or tried to hide your addiction.  You need a particular substance or activity to start your day or to calm down.  You are getting in trouble at school, work, home, or with the police.  You have done something illegal to support your addiction.  You are running out of money because of your addiction.  You have no time for anything other than your addiction.  What types of treatment are available? The treatment program that is right for you will depend on many factors, including the type of addiction you have. Treatment programs can be outpatient or inpatient. In an outpatient program, you live at home and go to work or school, but you also go to a clinic for treatment. With an inpatient program, you live and sleep at the program facility during treatment. After treatment, you might need a plan for support during recovery. Other treatment options include:  Medicine. ? Some addictions may be treated with prescription medicines. ? You might also need medicine to treat anxiety or depression.  Counseling and behavior therapy. Therapy can help individuals and families behave in healthier ways and relate more effectively.  Support groups. Confidential group therapy, such as a 12-step program, can help individuals and families during treatment and recovery.  No single type of program is right for everyone. Many treatment programs involve a combination of education, counseling, and a 12-step,  spiritually-based approach. Some treatment programs are government sponsored. They are geared for patients who do not have private insurance. Treatment programs can vary in many respects, such as:  Cost and types of insurance that are accepted.  Types of on-site medical services that are offered.  Length of stay, setting, and size.  Overall philosophy of treatment.  What should I consider when selecting a treatment program? It is important to think about your individual requirements when selecting a treatment program. There are a number of things to consider, such as:  If the program is certified by the appropriate government agency. Even private programs must be certified and employ certified professionals.  If the program is covered by your insurance. If finances are a concern, the first call you should make is to your insurance company, if you have health insurance. Ask for a list of treatment programs that are in your network, and confirm any copayments and deductibles that you may have to pay. ? If you do not have insurance, or if you choose to attend a program that does not accept your insurance, discuss whether a payment plan can be set up.  If treatment is available in languages other than English, if needed.  If the program offers detoxification treatment, if needed.  If 12-step meetings are held at the center  or if transport is available for patients to attend meetings at other locations.  If the program is professional, organized, and clean.  If the program meets all of your needs, including physical and cultural needs.  If the facility offers specific treatment for your particular addiction.  If support continues to be offered after you have left the program.  If your treatment plan is continually looked at to make sure you are receiving the right treatment at the right time.  If mental health counseling is part of your treatment.  If medicine is included in treatment,  if needed.  If your family is included in your treatment plan and if support is offered to them throughout the treatment process.  How the treatment works to prevent relapse.  Where else can I get help?  Your health care provider. Ask him or her to help you find addiction treatment. These discussions are confidential.  The [employer] on Alcoholism and Drug Dependence (NCADD). This group has information about treatment centers and programs for people who have an addiction and for family members. ? The telephone number is 1-800-NCA-CALL (906-608-3789). ? The website is https://ncadd.org/about-ncadd/our-affiliates  The Substance Abuse and Mental Health Services Administration [facility]). This group will help you find publicly funded treatment centers, help hotlines, and counseling services near you. ? The telephone number is 1-800-662-HELP (548-281-0482). ? The website is www.findtreatment.[employer].pl In countries outside of the [place]. and [place], look in [employer] for contact information for services in your area. This information is not intended to replace advice given to you by your health care provider. Make sure you discuss any questions you have with your health care provider. Document Released: 09/12/2005 Document Revised: 09/09/2016 Document Reviewed: 08/02/2014 Elsevier Interactive Patient Education  2017 Elsevier Inc.   Radial Site Care Refer to this sheet in the next few weeks. These instructions provide you with information about caring for yourself after your procedure. Your health care provider may also give you more specific instructions. Your treatment has been planned according to current medical practices, but problems sometimes occur. Call your health care provider if you have any problems or questions after your procedure. What can I expect after the procedure? After your procedure, it is typical to have the following:  Bruising at the radial site that  usually fades within 1-2 weeks.  Blood collecting in the tissue (hematoma) that may be painful to the touch. It should usually decrease in size and tenderness within 1-2 weeks.  Follow these instructions at home:  Take medicines only as directed by your health care provider.  You may shower 24-48 hours after the procedure or as directed by your health care provider. Remove the bandage (dressing) and gently wash the site with plain soap and water. Pat the area dry with a clean towel. Do not rub the site, because this may cause bleeding.  Do not take baths, swim, or use a hot tub until your health care provider approves.  Check your insertion site every day for redness, swelling, or drainage.  Do not apply powder or lotion to the site.  Do not flex or bend the affected arm for 24 hours or as directed by your health care provider.  Do not push or pull heavy objects with the affected arm for 24 hours or as directed by your health care provider.  Do not lift over 10 lb (4.5 kg) for 5 days after your procedure or as directed by your health care provider.  Ask your health  care provider when it is okay to: ? Return to work or school. ? Resume usual physical activities or sports. ? Resume sexual activity.  Do not drive home if you are discharged the same day as the procedure. Have someone else drive you.  You may drive 24 hours after the procedure unless otherwise instructed by your health care provider.  Do not operate machinery or power tools for 24 hours after the procedure.  If your procedure was done as an outpatient procedure, which means that you went home the same day as your procedure, a responsible adult should be with you for the first 24 hours after you arrive home.  Keep all follow-up visits as directed by your health care provider. This is important. Contact a health care provider if:  You have a fever.  You have chills.  You have increased bleeding from the radial  site. Hold pressure on the site. Get help right away if:  You have unusual pain at the radial site.  You have redness, warmth, or swelling at the radial site.  You have drainage (other than a small amount of blood on the dressing) from the radial site.  The radial site is bleeding, and the bleeding does not stop after 30 minutes of holding steady pressure on the site.  Your arm or hand becomes pale, cool, tingly, or numb. This information is not intended to replace advice given to you by your health care provider. Make sure you discuss any questions you have with your health care provider. Document Released: 11/16/2010 Document Revised: 03/21/2016 Document Reviewed: 05/02/2014 Elsevier Interactive Patient Education  2018 Elsevier Inc.    Angiogram, Care After This sheet gives you information about how to care for yourself after your procedure. Your doctor may also give you more specific instructions. If you have problems or questions, contact your doctor. Follow these instructions at home: Insertion site care  Follow instructions from your doctor about how to take care of your long, thin tube (catheter) insertion area. Make sure you: ? Wash your hands with soap and water before you change your bandage (dressing). If you cannot use soap and water, use hand sanitizer. ? Change your bandage as told by your doctor. ? Leave stitches (sutures), skin glue, or skin tape (adhesive) strips in place. They may need to stay in place for 2 weeks or longer. If tape strips get loose and curl up, you may trim the loose edges. Do not remove tape strips completely unless your doctor says it is okay.  Do not take baths, swim, or use a hot tub until your doctor says it is okay.  You may shower 24-48 hours after the procedure or as told by your doctor. ? Gently wash the area with plain soap and water. ? Pat the area dry with a clean towel. ? Do not rub the area. This may cause bleeding.  Do not apply  powder or lotion to the area. Keep the area clean and dry.  Check your insertion area every day for signs of infection. Check for: ? More redness, swelling, or pain. ? Fluid or blood. ? Warmth. ? Pus or a bad smell. Activity  Rest as told by your doctor, usually for 1-2 days.  Do not lift anything that is heavier than 10 lbs. (4.5 kg) or as told by your doctor.  Do not drive for 24 hours if you were given a medicine to help you relax (sedative).  Do not drive or use heavy machinery  while taking prescription pain medicine. General instructions  Go back to your normal activities as told by your doctor, usually in about a week. Ask your doctor what activities are safe for you.  If the insertion area starts to bleed, lie flat and put pressure on the area. If the bleeding does not stop, get help right away. This is an emergency.  Drink enough fluid to keep your pee (urine) clear or pale yellow.  Take over-the-counter and prescription medicines only as told by your doctor.  Keep all follow-up visits as told by your doctor. This is important. Contact a doctor if:  You have a fever.  You have chills.  You have more redness, swelling, or pain around your insertion area.  You have fluid or blood coming from your insertion area.  The insertion area feels warm to the touch.  You have pus or a bad smell coming from your insertion area.  You have more bruising around the insertion area.  Blood collects in the tissue around the insertion area (hematoma) that may be painful to the touch. Get help right away if:  You have a lot of pain in the insertion area.  The insertion area swells very fast.  The insertion area is bleeding, and the bleeding does not stop after holding steady pressure on the area.  The area near or just beyond the insertion area becomes pale, cool, tingly, or numb. These symptoms may be an emergency. Do not wait to see if the symptoms will go away. Get medical  help right away. Call your local emergency services (911 in the [place] Do not drive yourself to the hospital. Summary  After the procedure, it is common to have bruising and tenderness at the long, thin tube insertion area.  After the procedure, it is important to rest and drink plenty of fluids.  Do not take baths, swim, or use a hot tub until your doctor says it is okay to do so. You may shower 24-48 hours after the procedure or as told by your doctor.  If the insertion area starts to bleed, lie flat and put pressure on the area. If the bleeding does not stop, get help right away. This is an emergency. This information is not intended to replace advice given to you by your health care provider. Make sure you discuss any questions you have with your health care provider. Document Released: 01/10/2009 Document Revised: 10/08/2016 Document Reviewed: 10/08/2016 Elsevier Interactive Patient Education  2017 [employer].

## 2017-07-22 NOTE — Progress Notes (Signed)
Site area: Right groin a 7 french venous sheath was removed  Site Prior to Removal:  Level 0  Pressure Applied For 15 MINUTES    Bedrest Beginning at 1300p  Manual:   Yes.    Patient Status During Pull:  stable  Post Pull Groin Site:  Level 0  Post Pull Instructions Given:  Yes.    Post Pull Pulses Present:  Yes.    Dressing Applied:  Yes.    Comments:  VS remain stable during sheath pull.

## 2017-07-22 NOTE — Discharge Summary (Signed)
Physician Discharge Summary  Alvin Benton ZOX:[id number] DOB: 03/14/1951 DOA: 07/21/2017  PCP: Marilynn Rail, MD  Admit date: 07/21/2017 Discharge date: 07/22/2017  Admitted From: Home Disposition:  Home  Recommendations for Outpatient Follow-up:  1. Follow up with PCP in 1 2. Patient discharged on aspiring 3. Not on full anticoagulation due to history of GI bleeding  Home Health: No Equipment/Devices: No   Discharge Condition: Stable CODE STATUS: Full  Diet recommendation: Heart healthy  Brief/Interim Summary: 66 year old male who presented with chest pain, nausea and vomiting. Patient does have significant history" is, in atrial fibrillation. Patient complained of 3 day history of nausea and vomiting, decreased by mouth intake, besides alcohol. He developed acute chest pressure and dyspnea, for last 3 hours prior to hospitalization, persistent, with only mild improvement with nitroglycerin. On the initial physical examination blood pressure 127/73, heart rate 154, respiratory 20-22, oxygen saturation 99%. Mucous membranes were moist, lungs were clear to auscultation bilaterally, no wheezing rales or rhonchi, heart S1-S2 present and rhythmic, tachycardic, no gallops, rubs or murmurs, abdomen was soft nontender, no lower extremity edema. Sodium 136, potassium 4.4, chloride 97, bicarbonate 17, glucose 156, BUN 10, creatinine 0.90, white count 10.6, he will and 14.7, hematocrit 43.1, platelets 307. INR 0.95, urinalysis negative for infection, alcohol level less than 5, chest x-ray was negative for infiltrates, EKG with SVT/ a flutter.   Patient was admitted to the hospital working diagnosis of atrial flutter with rapid ventricular response.   1. Atrial flutter/SVT. Patient was admitted to medical floor, he was placed on the remote telemetry monitor, he received IV diltiazem, he converted to sinus rhythm, his calculated chadsvasc score is 1-due to hypertension, due to history of noncompliance  and possible GI bleed related to alcohol abuse, decision was made to place patient on aspirin, his echocardiogram had normal LV function, reported severe aortic stenosis that might be overestimated due to aortic regurgitation. Patient had positive troponins, underwent cardiac catheterization with normal coronaries. Patient was placed on metoprolol 25 mg twice daily.  2. Anion gap metabolic acidosis. Patient was placed on IV fluids with dextrose, resolving anion gap down to 8, patient tolerated by mouth diet adequately. He has been advised to avoid further alcohol consumption.  3. Alcohol abuse. Patient had tremors and anxiety with improvement with benzodiazepines, advised to follow-up with his primary care physician for further assistance in alcohol cessation   Discharge Diagnoses:  Principal Problem:   Atrial fibrillation [facility]) Active Problems:   Alcohol abuse   Nausea & vomiting   Atrial fibrillation with RVR (HCC)   Alcoholic ketoacidosis   Elevated troponin    Discharge Instructions   Allergies as of 07/22/2017   No Known Allergies     Medication List    STOP taking these medications   thiamine 100 MG tablet     TAKE these medications   aspirin 81 MG EC tablet Take 1 tablet (81 mg total) by mouth daily.   hydrochlorothiazide 25 MG tablet Commonly known as:  HYDRODIURIL Take 25 mg by mouth daily.   irbesartan 150 MG tablet Commonly known as:  AVAPRO Take 1 tablet (150 mg total) by mouth daily.   metoprolol tartrate 25 MG tablet Commonly known as:  LOPRESSOR Take 1 tablet (25 mg total) by mouth 2 (two) times daily.            Discharge Care Instructions        Start     Ordered   07/23/17 0000  aspirin 81 MG  EC tablet  Daily     07/22/17 1637   07/22/17 0000  metoprolol tartrate (LOPRESSOR) 25 MG tablet  2 times daily     07/22/17 1637   07/22/17 0000  Increase activity slowly     07/22/17 1637   07/22/17 0000  Diet - low sodium heart healthy      07/22/17 1637   07/22/17 0000  Discharge instructions    Comments:  Please follow up with primary care in 7 days.   07/22/17 1637     Follow-up Information    Lars Masson, MD Follow up.   Specialty:  Cardiology Why:  our office will call you with a post hospital follow-up visit  Contact information: [address] [place] [address] [phone]          No Known Allergies  Consultations:  Cardiology   Procedures/Studies: Dg Chest Port 1 View  Result Date: 07/21/2017 CLINICAL DATA:  Nausea and vomiting x3 days. Chest tightness x2 hours. EXAM: PORTABLE CHEST 1 VIEW COMPARISON:  05/20/2017 FINDINGS: The heart size and mediastinal contours are within normal limits. Both lungs are clear. Right chronic third through sixth rib fractures. Right AC joint separation is excluded on this exam. No new osseous appearing abnormalities. No free air beneath the diaphragm. IMPRESSION: No active disease. Electronically Signed   By: Tollie Eth M.D.   On: 07/21/2017 02:17       Subjective: Patient feeling well, no nausea or vomiting, no chest pain, tremors have improved with lorazepam.   Discharge Exam: Vitals:   07/22/17 1255 07/22/17 1413  BP: 126/68 125/70  Pulse: (!) 57 61  Resp: 17 14  Temp:    SpO2: 99% 97%   Vitals:   07/22/17 1245 07/22/17 1250 07/22/17 1255 07/22/17 1413  BP: 126/68 128/66 126/68 125/70  Pulse: (!) 55 (!) 57 (!) 57 61  Resp: Temp:      TempSrc:      SpO2: 98% 99% 99% 97%  Weight:      Height:        General: Pt is alert, awake, not in acute distress E ENT, no pallor or icterus, oral mucosa moist Cardiovascular: RRR, S1/S2 +, no rubs, no gallops Respiratory: CTA bilaterally, no wheezing, no rhonchi Abdominal: Soft, NT, ND, bowel sounds + Extremities: no edema, no cyanosis    The results of significant diagnostics from this hospitalization (including imaging, microbiology, ancillary and laboratory) are  listed below for reference.     Microbiology: No results found for this or any previous visit (from the past 240 hour(s)).   Labs: BNP (last 3 results)  Recent Labs  02/26/17 1356  BNP 156.2*   Basic Metabolic Panel:  Recent Labs Lab 07/21/17 0150 07/21/17 0447 07/21/17 1112 07/22/17 0635  NA 136 135  --  136  K 4.4 4.4  --  3.4*  CL 97* 100*  --  104  CO2 17* 20*  --  24  GLUCOSE 156* 94  --  104*  BUN 10 12  --  10  CREATININE 0.90 0.80  --  0.65  CALCIUM 9.6 8.8*  --  8.8*  MG  --   --  1.6*  --   PHOS  --  2.9  --   --    Liver Function Tests:  Recent Labs Lab 07/21/17 0150  AST 60*  ALT 39  ALKPHOS 52  BILITOT 3.0*  PROT 7.5  ALBUMIN 4.3  Recent Labs Lab 07/21/17 0150  LIPASE 32   No results for input(s): AMMONIA in the last 168 hours. CBC:  Recent Labs Lab 07/21/17 0150 07/22/17 0635  WBC 10.6* 6.5  HGB 14.7 11.7*  HCT 43.1 34.7*  MCV 92.3 93.3  PLT 307 211   Cardiac Enzymes:  Recent Labs Lab 07/21/17 0159 07/21/17 0447 07/21/17 1112 07/21/17 1719  TROPONINI 0.16* 0.21* 0.18* 0.12*   BNP: Invalid input(s): POCBNP CBG: No results for input(s): GLUCAP in the last 168 hours. D-Dimer No results for input(s): DDIMER in the last 72 hours. Hgb A1c No results for input(s): HGBA1C in the last 72 hours. Lipid Profile  Recent Labs  07/21/17 0159  CHOL 262*  HDL 130  LDLCALC 114*  TRIG 92  CHOLHDL 2.0   Thyroid function studies No results for input(s): TSH, T4TOTAL, T3FREE, THYROIDAB in the last 72 hours.  Invalid input(s): FREET3 Anemia work up No results for input(s): VITAMINB12, FOLATE, FERRITIN, TIBC, IRON, RETICCTPCT in the last 72 hours. Urinalysis    Component Value Date/Time   COLORURINE YELLOW 07/21/2017 0425   APPEARANCEUR CLEAR 07/21/2017 0425   LABSPEC 1.020 07/21/2017 0425   PHURINE 5.0 07/21/2017 0425   GLUCOSEU 50 (A) 07/21/2017 0425   HGBUR SMALL (A) 07/21/2017 0425   BILIRUBINUR NEGATIVE  07/21/2017 0425   KETONESUR 80 (A) 07/21/2017 0425   PROTEINUR 100 (A) 07/21/2017 0425   NITRITE NEGATIVE 07/21/2017 0425   LEUKOCYTESUR NEGATIVE 07/21/2017 0425   Sepsis Labs Invalid input(s): PROCALCITONIN,  WBC,  LACTICIDVEN Microbiology No results found for this or any previous visit (from the past 240 hour(s)).   Time coordinating discharge: 45 minutes  SIGNED:   Coralie Keens, MD  [facility] 07/22/2017, 4:14 PM Pager [phone]  If 7PM-7AM, please contact night-coverage [email or web address] Password [email or web address]

## 2017-07-22 NOTE — ED Notes (Signed)
1 mg of ativan wasted, witnessed by Margie Billet, RN

## 2017-07-22 NOTE — Progress Notes (Addendum)
 Progress Note  Patient Name: Alvin Benton Date of Encounter: 07/22/2017  Primary Cardiologist: New patient  Subjective   He feels ok today, no chest pain or SOB, he remains in SR.   Inpatient Medications    Scheduled Meds: . aspirin EC  81 mg Oral Daily  . folic acid  1 mg Oral Daily  . Influenza vac split quadrivalent PF  0.5 mL Intramuscular Tomorrow-1000  . multivitamin with minerals  1 tablet Oral Daily  . pneumococcal 23 valent vaccine  0.5 mL Intramuscular Tomorrow-1000  . sodium chloride flush  3 mL Intravenous Q12H  . thiamine  100 mg Oral Daily   Or  . thiamine  100 mg Intravenous Daily   Continuous Infusions: . sodium chloride    . sodium chloride Stopped (07/22/17 0433)  . sodium chloride    . sodium chloride 1 mL/kg/hr (07/22/17 0754)  . diltiazem (CARDIZEM) infusion 5 mg/hr (07/22/17 0444)  . heparin 1,150 Units/hr (07/21/17 1221)   PRN Meds: sodium chloride, acetaminophen, LORazepam **OR** LORazepam, ondansetron (ZOFRAN) IV, sodium chloride flush   Vital Signs    Vitals:   07/21/17 1933 07/22/17 0002 07/22/17 0438 07/22/17 0802  BP: 135/78 (!) 144/71  (!) 149/71  Pulse: 83 71    Resp: 15 16    Temp: 98.4 F (36.9 C) 98.2 F (36.8 C) 98.5 F (36.9 C) 98.6 F (37 C)  TempSrc: Oral Oral Oral Oral  SpO2: 99% 97%  98%  Weight:      Height:        Intake/Output Summary (Last 24 hours) at 07/22/17 0819 Last data filed at 07/22/17 0433  Gross per 24 hour  Intake              360 ml  Output             2100 ml  Net            -1740 ml   Filed Weights   07/21/17 0136 07/21/17 0910  Weight: 168 lb (76.2 kg) 165 lb 2 oz (74.9 kg)    Telemetry    SR - Personally Reviewed  ECG    SR, normal ECG - Personally Reviewed  Physical Exam   GEN: No acute distress.   Neck: No JVD Cardiac: RRR, 4/6 holosystolic and 3/6 diastolic murmur, S2 present, rubs, or gallops.  Respiratory: Clear to auscultation bilaterally. GI: Soft, nontender,  non-distended  MS: No edema; No deformity. Neuro:  Nonfocal  Psych: Normal affect   Labs    Chemistry Recent Labs Lab 07/21/17 0150 07/21/17 0447 07/22/17 0635  NA 136 135 136  K 4.4 4.4 3.4*  CL 97* 100* 104  CO2 17* 20* 24  GLUCOSE 156* 94 104*  BUN 10 12 10  CREATININE 0.90 0.80 0.65  CALCIUM 9.6 8.8* 8.8*  PROT 7.5  --   --   ALBUMIN 4.3  --   --   AST 60*  --   --   ALT 39  --   --   ALKPHOS 52  --   --   BILITOT 3.0*  --   --   GFRNONAA >60 >60 >60  GFRAA >60 >60 >60  ANIONGAP 22* 15 8     Hematology Recent Labs Lab 07/21/17 0150 07/22/17 0635  WBC 10.6* 6.5  RBC 4.67 3.72*  HGB 14.7 11.7*  HCT 43.1 34.7*  MCV 92.3 93.3  MCH 31.5 31.5  MCHC 34.1 33.7  RDW 13.7 13.8  PLT   307 211    Cardiac Enzymes Recent Labs Lab 07/21/17 0159 07/21/17 0447 07/21/17 1112 07/21/17 1719  TROPONINI 0.16* 0.21* 0.18* 0.12*   No results for input(s): TROPIPOC in the last 168 hours.   BNPNo results for input(s): BNP, PROBNP in the last 168 hours.   DDimer No results for input(s): DDIMER in the last 168 hours.   Radiology    Dg Chest Port 1 View  Result Date: 07/21/2017 CLINICAL DATA:  Nausea and vomiting x3 days. Chest tightness x2 hours. EXAM: PORTABLE CHEST 1 VIEW COMPARISON:  05/20/2017 FINDINGS: The heart size and mediastinal contours are within normal limits. Both lungs are clear. Right chronic third through sixth rib fractures. Right AC joint separation is excluded on this exam. No new osseous appearing abnormalities. No free air beneath the diaphragm. IMPRESSION: No active disease. Electronically Signed   By: David  Kwon M.D.   On: 07/21/2017 02:17    Cardiac Studies   - Left ventricle: The cavity size was normal. There was mild   concentric hypertrophy. Systolic function was vigorous. The   estimated ejection fraction was in the range of 65% to 70%. Wall   motion was normal; there were no regional wall motion   abnormalities. Left ventricular diastolic  function parameters   were normal. Doppler parameters are consistent with elevated   ventricular end-diastolic filling pressure. - Aortic valve: Possibly bicuspid; moderately thickened, moderately   calcified leaflets. Valve mobility was restricted. There was   severe stenosis. There was moderate regurgitation. Mean gradient   (S): 54 mm Hg. Peak gradient (S): 77 mm Hg. Valve area (VTI):   1.19 cm^2. Valve area (Vmax): 1.46 cm^2. Valve area (Vmean): 1.17   cm^2. - Aortic root: The aortic root was normal in size. - Ascending aorta: The ascending aorta was normal in size. - Mitral valve: There was mild regurgitation. - Left atrium: The atrium was mildly dilated. - Right ventricle: Systolic function was normal. - Right atrium: The atrium was normal in size. - Tricuspid valve: There was mild regurgitation. - Pulmonary arteries: Systolic pressure was within the normal   range. - Inferior vena cava: The vessel was normal in size. - Pericardium, extracardiac: There was no pericardial effusion.  Impressions:  - Aortic valve is thickened and possibly bicuspid. There is   moderate aortic regurgitation and transaortic gradients   consistent with severe aortic stenosis. These might be   overestimated by high flow sec to aortic regurgitation.   LVEF is hyperdynamic.  Patient Profile     66 y.o. male   Assessment & Plan    1. New Onset Atrial Flutter 2.  Aortic Valve Disease - moderate AI and severe AS  66-year-old male with etoh and tobacco dependence, admitted twice in July and August with DTs, hyponatremia and a fall in the setting of intoxication.  IHe came to the ED early morning of nausea and vomiting and decreased PO intake  x 3 days, after several hours of retching he developed severe chest pain. He woke up at midnight with palpitations and chest pain and came to the ER. He was found to be in atrial flutter with RVR, and started on cardizem drip that cardioverted him to SR.  Currently in SR< total duration < 4 hours, on iv Heparin. CHA2DS2 VASc score is at least 1 for HTN. I would not start anticoagulation given the history of non-compliance and possible GIB with etoh abuse (esophageal varices rtc). Echocardiogram shows hyperdynamic LVEF, Aortic valve is thickened and   possibly bicuspid. There is moderate aortic regurgitation and transaortic gradients consistent with severe aortic stenosis. These might be overestimated by high flow sec to aortic regurgitation. Troponin max 0.4, now downtrending. We will plan for a left and right cath today.  Remains in SR, d/c Diltiazem drip, start metoprolol 25 mg po BID, replace potassium.  Discharge home if normal cath.  For questions or updates, please contact [facility] Please consult [email or web address] for contact info under Cardiology/STEMI.      Signed, Adeleigh Barletta, MD  07/22/2017, 8:19 AM     .  

## 2017-07-22 NOTE — Interval H&P Note (Signed)
History and Physical Interval Note:  07/22/2017 11:20 AM  Alvin Benton  has presented today for surgery, with the diagnosis of as / + trop  The various methods of treatment have been discussed with the patient and family. After consideration of risks, benefits and other options for treatment, the patient has consented to  Procedure(s): RIGHT/LEFT HEART CATH AND CORONARY ANGIOGRAPHY (N/A) as a surgical intervention .  The patient's history has been reviewed, patient examined, no change in status, stable for surgery.  I have reviewed the patient's chart and labs.  Questions were answered to the patient's satisfaction.   Cath Lab Visit (complete for each Cath Lab visit)  Clinical Evaluation Leading to the Procedure:   ACS: Yes.    Non-ACS:    Anginal Classification: CCS IV  Anti-ischemic medical therapy: Minimal Therapy (1 class of medications)  Non-Invasive Test Results: No non-invasive testing performed  Prior CABG: No previous CABG        Theron Arista [facility] 07/22/2017 11:20 AM

## 2017-07-22 NOTE — H&P (View-Only) (Signed)
Progress Note  Patient Name: Alvin Benton Date of Encounter: 07/22/2017  Primary Cardiologist: New patient  Subjective   He feels ok today, no chest pain or SOB, he remains in SR.   Inpatient Medications    Scheduled Meds: . aspirin EC  81 mg Oral Daily  . folic acid  1 mg Oral Daily  . Influenza vac split quadrivalent PF  0.5 mL Intramuscular Tomorrow-1000  . multivitamin with minerals  1 tablet Oral Daily  . pneumococcal 23 valent vaccine  0.5 mL Intramuscular Tomorrow-1000  . sodium chloride flush  3 mL Intravenous Q12H  . thiamine  100 mg Oral Daily   Or  . thiamine  100 mg Intravenous Daily   Continuous Infusions: . sodium chloride    . sodium chloride Stopped (07/22/17 0433)  . sodium chloride    . sodium chloride 1 mL/kg/hr (07/22/17 0754)  . diltiazem (CARDIZEM) infusion 5 mg/hr (07/22/17 0444)  . heparin 1,150 Units/hr (07/21/17 1221)   PRN Meds: sodium chloride, acetaminophen, LORazepam **OR** LORazepam, ondansetron (ZOFRAN) IV, sodium chloride flush   Vital Signs    Vitals:   07/21/17 1933 07/22/17 0002 07/22/17 0438 07/22/17 0802  BP: 135/78 (!) 144/71  (!) 149/71  Pulse: 83 71    Resp: 15 16    Temp: 98.4 F (36.9 C) 98.2 F (36.8 C) 98.5 F (36.9 C) 98.6 F (37 C)  TempSrc: Oral Oral Oral Oral  SpO2: 99% 97%  98%  Weight:      Height:        Intake/Output Summary (Last 24 hours) at 07/22/17 0819 Last data filed at 07/22/17 0433  Gross per 24 hour  Intake              360 ml  Output             2100 ml  Net            -1740 ml   Filed Weights   07/21/17 0136 07/21/17 0910  Weight: 168 lb (76.2 kg) 165 lb 2 oz (74.9 kg)    Telemetry    SR - Personally Reviewed  ECG    SR, normal ECG - Personally Reviewed  Physical Exam   GEN: No acute distress.   Neck: No JVD Cardiac: RRR, 4/6 holosystolic and 3/6 diastolic murmur, S2 present, rubs, or gallops.  Respiratory: Clear to auscultation bilaterally. GI: Soft, nontender,  non-distended  MS: No edema; No deformity. Neuro:  Nonfocal  Psych: Normal affect   Labs    Chemistry Recent Labs Lab 07/21/17 0150 07/21/17 0447 07/22/17 0635  NA 136 135 136  K 4.4 4.4 3.4*  CL 97* 100* 104  CO2 17* 20* 24  GLUCOSE 156* 94 104*  BUN CREATININE 0.90 0.80 0.65  CALCIUM 9.6 8.8* 8.8*  PROT 7.5  --   --   ALBUMIN 4.3  --   --   AST 60*  --   --   ALT 39  --   --   ALKPHOS 52  --   --   BILITOT 3.0*  --   --   GFRNONAA >60 >60 >60  GFRAA >60 >60 >60  ANIONGAP 22* 15 8     Hematology Recent Labs Lab 07/21/17 0150 07/22/17 0635  WBC 10.6* 6.5  RBC 4.67 3.72*  HGB 14.7 11.7*  HCT 43.1 34.7*  MCV 92.3 93.3  MCH 31.5 31.5  MCHC 34.1 33.7  RDW 13.7 13.8  PLT  307 211    Cardiac Enzymes Recent Labs Lab 07/21/17 0159 07/21/17 0447 07/21/17 1112 07/21/17 1719  TROPONINI 0.16* 0.21* 0.18* 0.12*   No results for input(s): TROPIPOC in the last 168 hours.   BNPNo results for input(s): BNP, PROBNP in the last 168 hours.   DDimer No results for input(s): DDIMER in the last 168 hours.   Radiology    Dg Chest Port 1 View  Result Date: 07/21/2017 CLINICAL DATA:  Nausea and vomiting x3 days. Chest tightness x2 hours. EXAM: PORTABLE CHEST 1 VIEW COMPARISON:  05/20/2017 FINDINGS: The heart size and mediastinal contours are within normal limits. Both lungs are clear. Right chronic third through sixth rib fractures. Right AC joint separation is excluded on this exam. No new osseous appearing abnormalities. No free air beneath the diaphragm. IMPRESSION: No active disease. Electronically Signed   By: Tollie Eth M.D.   On: 07/21/2017 02:17    Cardiac Studies   - Left ventricle: The cavity size was normal. There was mild   concentric hypertrophy. Systolic function was vigorous. The   estimated ejection fraction was in the range of 65% to 70%. Wall   motion was normal; there were no regional wall motion   abnormalities. Left ventricular diastolic  function parameters   were normal. Doppler parameters are consistent with elevated   ventricular end-diastolic filling pressure. - Aortic valve: Possibly bicuspid; moderately thickened, moderately   calcified leaflets. Valve mobility was restricted. There was   severe stenosis. There was moderate regurgitation. Mean gradient   (S): 54 mm Hg. Peak gradient (S): 77 mm Hg. Valve area (VTI):   1.19 cm^2. Valve area (Vmax): 1.46 cm^2. Valve area (Vmean): 1.17   cm^2. - Aortic root: The aortic root was normal in size. - Ascending aorta: The ascending aorta was normal in size. - Mitral valve: There was mild regurgitation. - Left atrium: The atrium was mildly dilated. - Right ventricle: Systolic function was normal. - Right atrium: The atrium was normal in size. - Tricuspid valve: There was mild regurgitation. - Pulmonary arteries: Systolic pressure was within the normal   range. - Inferior vena cava: The vessel was normal in size. - Pericardium, extracardiac: There was no pericardial effusion.  Impressions:  - Aortic valve is thickened and possibly bicuspid. There is   moderate aortic regurgitation and transaortic gradients   consistent with severe aortic stenosis. These might be   overestimated by high flow sec to aortic regurgitation.   LVEF is hyperdynamic.  Patient Profile     66 y.o. male   Assessment & Plan    1. New Onset Atrial Flutter 2.  Aortic Valve Disease - moderate AI and severe AS  66 year old male with etoh and tobacco dependence, admitted twice in July and August with DTs, hyponatremia and a fall in the setting of intoxication.  IHe came to the ED early morning of nausea and vomiting and decreased PO intake  x 3 days, after several hours of retching he developed severe chest pain. He woke up at midnight with palpitations and chest pain and came to the ER. He was found to be in atrial flutter with RVR, and started on cardizem drip that cardioverted him to SR.  Currently in SR< total duration < 4 hours, on iv Heparin. CHA2DS2 VASc score is at least 1 for HTN. I would not start anticoagulation given the history of non-compliance and possible GIB with etoh abuse (esophageal varices rtc). Echocardiogram shows hyperdynamic LVEF, Aortic valve is thickened and  possibly bicuspid. There is moderate aortic regurgitation and transaortic gradients consistent with severe aortic stenosis. These might be overestimated by high flow sec to aortic regurgitation. Troponin max 0.4, now downtrending. We will plan for a left and right cath today.  Remains in SR, d/c Diltiazem drip, start metoprolol 25 mg po BID, replace potassium.  Discharge home if normal cath.  For questions or updates, please contact [facility] Please consult [email or web address] for contact info under Cardiology/STEMI.      Signed, [name], MD  07/22/2017, 8:19 AM     .

## 2017-07-22 NOTE — Clinical Social Work Note (Signed)
Clinical Social Work Assessment  Patient Details  Name: Alvin Benton MRN: [id number] Date of Birth: 01/30/1951  Date of referral:  07/22/17               Reason for consult:  Substance Use/ETOH Abuse                Permission sought to share information with:    Permission granted to share information::     Name::        Agency::     Relationship::     Contact Information:     Housing/Transportation Living arrangements for the past 2 months:  Single Family Home Source of Information:  Patient Patient Interpreter Needed:  None Criminal Activity/Legal Involvement Pertinent to Current Situation/Hospitalization:  No - Comment as needed Significant Relationships:  Friend Lives with:  Self Do you feel safe going back to the place where you live?  Yes Need for family participation in patient care:  No (Coment)  Care giving concerns:  Patient from home alone. CSW consulted for substance use.   Social Worker assessment / plan: CSW met with patient at bedside. Patient reports having been in substance use treatment at [facility] and finished treatment at the end of last month and returned home. Patient reported his last alcohol use as "a while ago." When asked about noticeable shaking and tremors, patient reported it is due to anxiety. Patient reports he has a follow up appointment with PCP scheduled. CSW offered outpatient treatment resources and patient declined. Patient stated he is ready to go home and "I'm done with the booze." CSW signing off.  Employment status:    Insurance information:  Managed Care PT Recommendations:  Not assessed at this time Information / Referral to community resources:  Outpatient Substance Abuse Treatment Options  Patient/Family's Response to care: Patient appreciative of care and ready to go home.  Patient/Family's Understanding of and Emotional Response to Diagnosis, Current Treatment, and Prognosis: Patient did not discuss emotional response to  treatment.  Emotional Assessment Appearance:  Appears stated age Attitude/Demeanor/Rapport:  Unable to Assess (appropriate) Affect (typically observed):  Anxious Orientation:  Oriented to Self, Oriented to Situation, Oriented to Place, Oriented to  Time Alcohol / Substance use:  Alcohol Use Psych involvement (Current and /or in the community):  No (Comment)  Discharge Needs  Concerns to be addressed:  Substance Abuse Concerns Readmission within the last 30 days:  No Current discharge risk:  Lives alone Barriers to Discharge:  No Barriers Identified   Estanislado Emms, LCSW 07/22/2017, 4:57 PM

## 2017-07-23 MED FILL — Lidocaine HCl Local Inj 2%: INTRAMUSCULAR | Qty: 10 | Status: AC

## 2017-08-06 ENCOUNTER — Ambulatory Visit: Payer: Managed Care, Other (non HMO) | Admitting: Neurology

## 2017-09-02 ENCOUNTER — Ambulatory Visit: Payer: Managed Care, Other (non HMO) | Admitting: Neurology

## 2018-01-01 ENCOUNTER — Emergency Department (HOSPITAL_COMMUNITY): Payer: Self-pay

## 2018-01-01 ENCOUNTER — Inpatient Hospital Stay (HOSPITAL_COMMUNITY)
Admission: EM | Admit: 2018-01-01 | Discharge: 2018-01-07 | DRG: 439 | Disposition: A | Discharge status: Skilled Nursing Facility | Payer: Self-pay | Attending: Family Medicine | Admitting: Family Medicine

## 2018-01-01 ENCOUNTER — Encounter (HOSPITAL_COMMUNITY): Payer: Self-pay

## 2018-01-01 ENCOUNTER — Other Ambulatory Visit: Payer: Self-pay

## 2018-01-01 DIAGNOSIS — F1010 Alcohol abuse, uncomplicated: Secondary | ICD-10-CM

## 2018-01-01 DIAGNOSIS — R296 Repeated falls: Secondary | ICD-10-CM | POA: Diagnosis present

## 2018-01-01 DIAGNOSIS — E663 Overweight: Secondary | ICD-10-CM | POA: Diagnosis present

## 2018-01-01 DIAGNOSIS — Z6827 Body mass index (BMI) 27.0-27.9, adult: Secondary | ICD-10-CM

## 2018-01-01 DIAGNOSIS — I480 Paroxysmal atrial fibrillation: Secondary | ICD-10-CM | POA: Diagnosis present

## 2018-01-01 DIAGNOSIS — E872 Acidosis: Secondary | ICD-10-CM | POA: Diagnosis present

## 2018-01-01 DIAGNOSIS — R05 Cough: Secondary | ICD-10-CM | POA: Diagnosis not present

## 2018-01-01 DIAGNOSIS — R7401 Elevation of levels of liver transaminase levels: Secondary | ICD-10-CM

## 2018-01-01 DIAGNOSIS — Z823 Family history of stroke: Secondary | ICD-10-CM

## 2018-01-01 DIAGNOSIS — R109 Unspecified abdominal pain: Secondary | ICD-10-CM

## 2018-01-01 DIAGNOSIS — R17 Unspecified jaundice: Secondary | ICD-10-CM

## 2018-01-01 DIAGNOSIS — K700 Alcoholic fatty liver: Secondary | ICD-10-CM | POA: Diagnosis present

## 2018-01-01 DIAGNOSIS — F1020 Alcohol dependence, uncomplicated: Secondary | ICD-10-CM

## 2018-01-01 DIAGNOSIS — K7010 Alcoholic hepatitis without ascites: Secondary | ICD-10-CM | POA: Diagnosis present

## 2018-01-01 DIAGNOSIS — K852 Alcohol induced acute pancreatitis without necrosis or infection: Principal | ICD-10-CM

## 2018-01-01 DIAGNOSIS — I10 Essential (primary) hypertension: Secondary | ICD-10-CM | POA: Diagnosis present

## 2018-01-01 DIAGNOSIS — R278 Other lack of coordination: Secondary | ICD-10-CM | POA: Diagnosis present

## 2018-01-01 DIAGNOSIS — K859 Acute pancreatitis without necrosis or infection, unspecified: Secondary | ICD-10-CM

## 2018-01-01 DIAGNOSIS — D638 Anemia in other chronic diseases classified elsewhere: Secondary | ICD-10-CM | POA: Diagnosis present

## 2018-01-01 DIAGNOSIS — E7220 Disorder of urea cycle metabolism, unspecified: Secondary | ICD-10-CM | POA: Diagnosis present

## 2018-01-01 DIAGNOSIS — Z79899 Other long term (current) drug therapy: Secondary | ICD-10-CM

## 2018-01-01 DIAGNOSIS — K589 Irritable bowel syndrome without diarrhea: Secondary | ICD-10-CM | POA: Diagnosis present

## 2018-01-01 DIAGNOSIS — I4891 Unspecified atrial fibrillation: Secondary | ICD-10-CM | POA: Diagnosis present

## 2018-01-01 DIAGNOSIS — E876 Hypokalemia: Secondary | ICD-10-CM

## 2018-01-01 DIAGNOSIS — K219 Gastro-esophageal reflux disease without esophagitis: Secondary | ICD-10-CM | POA: Diagnosis present

## 2018-01-01 DIAGNOSIS — R740 Nonspecific elevation of levels of transaminase and lactic acid dehydrogenase [LDH]: Secondary | ICD-10-CM

## 2018-01-01 DIAGNOSIS — F17210 Nicotine dependence, cigarettes, uncomplicated: Secondary | ICD-10-CM | POA: Diagnosis present

## 2018-01-01 DIAGNOSIS — D696 Thrombocytopenia, unspecified: Secondary | ICD-10-CM | POA: Diagnosis present

## 2018-01-01 LAB — ETHANOL

## 2018-01-01 LAB — COMPREHENSIVE METABOLIC PANEL
ALT: 127 U/L — ABNORMAL HIGH (ref 17–63)
ANION GAP: 21 — AB (ref 5–15)
AST: 368 U/L — ABNORMAL HIGH (ref 15–41)
Albumin: 3.2 g/dL — ABNORMAL LOW (ref 3.5–5.0)
Alkaline Phosphatase: 146 U/L — ABNORMAL HIGH (ref 38–126)
BUN: 10 mg/dL (ref 6–20)
CALCIUM: 8.7 mg/dL — AB (ref 8.9–10.3)
CHLORIDE: 94 mmol/L — AB (ref 101–111)
CO2: 17 mmol/L — AB (ref 22–32)
Creatinine, Ser: 1.08 mg/dL (ref 0.61–1.24)
GFR calc non Af Amer: >60 mL/min (ref >60)
GLUCOSE: 81 mg/dL (ref 65–99)
POTASSIUM: 2.5 mmol/L — AB (ref 3.5–5.1)
SODIUM: 132 mmol/L — AB (ref 135–145)
Total Bilirubin: 7.7 mg/dL — ABNORMAL HIGH (ref 0.3–1.2)
Total Protein: 6.5 g/dL (ref 6.5–8.1)

## 2018-01-01 LAB — CBC
HEMATOCRIT: 35.1 % — AB (ref 39.0–52.0)
HEMOGLOBIN: 12.6 g/dL — AB (ref 13.0–17.0)
MCH: 35 pg — AB (ref 26.0–34.0)
MCHC: 35.9 g/dL (ref 30.0–36.0)
MCV: 97.5 fL (ref 78.0–100.0)
Platelets: 159 10*3/uL (ref 150–400)
RBC: 3.6 MIL/uL — AB (ref 4.22–5.81)
RDW: 14.7 % (ref 11.5–15.5)
WBC: 8.5 10*3/uL (ref 4.0–10.5)

## 2018-01-01 LAB — LIPASE, BLOOD: LIPASE: 279 U/L — AB (ref 11–51)

## 2018-01-01 MED ORDER — MAGNESIUM SULFATE 2 GM/50ML IV SOLN
2.0000 g | Freq: Once | INTRAVENOUS | Status: AC
  Administered 2018-01-01: 2 g via INTRAVENOUS
  Filled 2018-01-01: qty 50

## 2018-01-01 MED ORDER — LORAZEPAM 1 MG PO TABS
1.0000 mg | ORAL_TABLET | Freq: Four times a day (QID) | ORAL | Status: DC | PRN
  Administered 2018-01-02 – 2018-01-04 (×7): 1 mg via ORAL
  Filled 2018-01-01 (×7): qty 1

## 2018-01-01 MED ORDER — FOLIC ACID 1 MG PO TABS
1.0000 mg | ORAL_TABLET | Freq: Every day | ORAL | Status: DC
  Administered 2018-01-02 – 2018-01-07 (×6): 1 mg via ORAL
  Filled 2018-01-01 (×6): qty 1

## 2018-01-01 MED ORDER — VITAMIN B-1 100 MG PO TABS
100.0000 mg | ORAL_TABLET | Freq: Every day | ORAL | Status: DC
  Administered 2018-01-02 – 2018-01-07 (×6): 100 mg via ORAL
  Filled 2018-01-01 (×6): qty 1

## 2018-01-01 MED ORDER — POTASSIUM CHLORIDE 10 MEQ/100ML IV SOLN
10.0000 meq | INTRAVENOUS | Status: DC
  Administered 2018-01-01: 10 meq via INTRAVENOUS
  Filled 2018-01-01: qty 100

## 2018-01-01 MED ORDER — ADULT MULTIVITAMIN W/MINERALS CH
1.0000 | ORAL_TABLET | Freq: Every day | ORAL | Status: DC
  Administered 2018-01-02 – 2018-01-07 (×6): 1 via ORAL
  Filled 2018-01-01 (×6): qty 1

## 2018-01-01 MED ORDER — ONDANSETRON HCL 4 MG PO TABS
4.0000 mg | ORAL_TABLET | Freq: Four times a day (QID) | ORAL | Status: DC | PRN
Start: 2018-01-01 — End: 2018-01-07
  Filled 2018-01-01: qty 1

## 2018-01-01 MED ORDER — THIAMINE HCL 100 MG/ML IJ SOLN: Freq: Once | INTRAVENOUS | Status: DC

## 2018-01-01 MED ORDER — SODIUM CHLORIDE 0.9 % IV BOLUS (SEPSIS)
1000.0000 mL | Freq: Once | INTRAVENOUS | Status: AC
  Administered 2018-01-01: 1000 mL via INTRAVENOUS

## 2018-01-01 MED ORDER — ACETAMINOPHEN 325 MG PO TABS
650.0000 mg | ORAL_TABLET | Freq: Four times a day (QID) | ORAL | Status: DC | PRN
  Administered 2018-01-03 – 2018-01-06 (×4): 650 mg via ORAL
  Filled 2018-01-01 (×5): qty 2

## 2018-01-01 MED ORDER — MORPHINE SULFATE (PF) 4 MG/ML IV SOLN: 2.0000 mg | INTRAVENOUS | Status: DC | PRN

## 2018-01-01 MED ORDER — ONDANSETRON HCL 4 MG/2ML IJ SOLN
4.0000 mg | Freq: Once | INTRAMUSCULAR | Status: AC
  Administered 2018-01-01: 4 mg via INTRAVENOUS
  Filled 2018-01-01: qty 2

## 2018-01-01 MED ORDER — POTASSIUM CHLORIDE 10 MEQ/100ML IV SOLN
10.0000 meq | INTRAVENOUS | Status: AC
  Administered 2018-01-02 (×4): 10 meq via INTRAVENOUS
  Filled 2018-01-01 (×4): qty 100

## 2018-01-01 MED ORDER — ONDANSETRON HCL 4 MG/2ML IJ SOLN
4.0000 mg | Freq: Four times a day (QID) | INTRAMUSCULAR | Status: DC | PRN
  Administered 2018-01-04 – 2018-01-06 (×3): 4 mg via INTRAVENOUS
  Filled 2018-01-01 (×4): qty 2

## 2018-01-01 MED ORDER — THIAMINE HCL 100 MG/ML IJ SOLN
Freq: Once | INTRAVENOUS | Status: AC
  Administered 2018-01-01: 23:00:00 via INTRAVENOUS
  Filled 2018-01-01: qty 1000

## 2018-01-01 MED ORDER — ACETAMINOPHEN 650 MG RE SUPP: 650.0000 mg | Freq: Four times a day (QID) | RECTAL | Status: DC | PRN

## 2018-01-01 MED ORDER — IOPAMIDOL (ISOVUE-300) INJECTION 61%
INTRAVENOUS | Status: AC
  Administered 2018-01-01: 100 mL
  Filled 2018-01-01: qty 100

## 2018-01-01 MED ORDER — DEXTROSE-NACL 5-0.45 % IV SOLN
INTRAVENOUS | Status: DC
  Administered 2018-01-02 (×2): via INTRAVENOUS

## 2018-01-01 MED ORDER — LORAZEPAM 2 MG/ML IJ SOLN
2.0000 mg | Freq: Once | INTRAMUSCULAR | Status: AC
  Administered 2018-01-01: 2 mg via INTRAVENOUS
  Filled 2018-01-01: qty 1

## 2018-01-01 MED ORDER — ENOXAPARIN SODIUM 40 MG/0.4ML ~~LOC~~ SOLN
40.0000 mg | SUBCUTANEOUS | Status: DC
  Filled 2018-01-01: qty 0.4

## 2018-01-01 MED ORDER — THIAMINE HCL 100 MG/ML IJ SOLN: 100.0000 mg | Freq: Every day | INTRAMUSCULAR | Status: DC

## 2018-01-01 MED ORDER — LORAZEPAM 2 MG/ML IJ SOLN
1.0000 mg | Freq: Four times a day (QID) | INTRAMUSCULAR | Status: DC | PRN
  Administered 2018-01-02 – 2018-01-04 (×4): 1 mg via INTRAVENOUS
  Filled 2018-01-01 (×4): qty 1

## 2018-01-01 NOTE — H&P (Signed)
History and Physical    Alvin Benton ZOX:[id number] DOB: [birth year] DOA: 01/01/2018  PCP: Marilynn Rail, MD  Patient coming from: Home  I have personally briefly reviewed patient's old medical records in [facility]  Chief Complaint: Abd pain  HPI: Alvin Benton is a 67 y.o. male with medical history significant of EtOH abuse, HTN, A.Fib.  Patient presents to the ED with c/o abd pain.  Pain is epigastric, aching.  Ongoing for the past week, worsening in that time period.  Severe N/V especially over last couple of days.  "Havent drank alcohol in a while", but turns out that means that last EtOH drink was ~2-3 days ago he thinks but mostly vomited that up.  No other PO intake since then.   ED Course: Acute pancreatitis.  Significant advancement of hepatic steatosis in past year according to CT.  LFTs elevated.  No biliary dilation nor stone on CT.  Hypokalemia with K 2.5.  Bili 7.7.   Review of Systems: As per HPI otherwise 10 point review of systems negative.   Past Medical History:  Diagnosis Date  . Atrial fibrillation (HCC) 06/2017  . Blood in stool   . Diverticulitis   . ETOH abuse   . GERD (gastroesophageal reflux disease)   . Heart murmur   . History of urinary tract infection   . Hypertension   . IBS (irritable bowel syndrome)   . Migraine    "none in a long time" (03/14/2016)  . Nocturia   . Pneumonia    childhood  . Seasonal allergies   . Tobacco abuse     Past Surgical History:  Procedure Laterality Date  . COLONOSCOPY    . CYSTOSCOPY WITH STENT PLACEMENT Left 10/03/2016   Procedure: CYSTOSCOPY WITH  LEFT URETERAL CATHETER PLACEMENT;  Surgeon: Bjorn Pippin, MD;  Location: [facility];  Service: Urology;  Laterality: Left;  . PROCTOSCOPY  10/03/2016   Procedure: PROCTOSCOPY;  Surgeon: Romie Levee, MD;  Location: [facility];  Service: General;;  . RIGHT/LEFT HEART CATH AND CORONARY ANGIOGRAPHY N/A 07/22/2017   Procedure: RIGHT/LEFT HEART CATH AND CORONARY ANGIOGRAPHY;   Surgeon: Swaziland, Peter M, MD;  Location: [facility] INVASIVE CV LAB;  Service: Cardiovascular;  Laterality: N/A;  . TONSILLECTOMY       reports that he has been smoking cigarettes.  He has a 40.00 pack-year smoking history. he has never used smokeless tobacco. He reports that he drinks about 1.8 oz of alcohol per week. He reports that he does not use drugs.  No Known Allergies  Family History  Problem Relation Age of Onset  . Stroke Mother   . Colon cancer Neg Hx   . Esophageal cancer Neg Hx   . Rectal cancer Neg Hx   . Stomach cancer Neg Hx      Prior to Admission medications   Medication Sig Start Date End Date Taking? Authorizing Provider  Albuterol Sulfate 108 (90 Base) MCG/ACT AEPB Inhale 2 puffs into the lungs every 4 (four) hours as needed for shortness of breath. 02/11/17  Yes [provider]  hydrochlorothiazide (HYDRODIURIL) 25 MG tablet Take 25 mg by mouth daily. 06/25/17  Yes [provider]  valsartan (DIOVAN) 160 MG tablet Take 160 mg by mouth daily. 12/12/16  Yes [provider]  irbesartan (AVAPRO) 150 MG tablet Take 1 tablet (150 mg total) by mouth daily. Patient not taking: Reported on 01/01/2018 06/06/17   Alvin Shipper, MD  metoprolol tartrate (LOPRESSOR) 25 MG tablet Take 1 tablet (25  mg total) by mouth 2 (two) times daily. 07/22/17 08/21/17  Coralie KeensArrien, Mauricio Daniel, MD    Physical Exam: Vitals:   01/01/18 2145 01/01/18 2200 01/01/18 2215 01/01/18 2253  BP: 129/74 134/73 (!) 138/95 132/77  Pulse: 96 95 90 82  Resp: 15 17 20 18   Temp:      TempSrc:      SpO2: 97% 96% 100% 100%    Constitutional: NAD, calm, comfortable Eyes: PERRL, lids and conjunctivae normal ENMT: Mucous membranes are moist. Posterior pharynx clear of any exudate or lesions.Normal dentition.  Neck: normal, supple, no masses, no thyromegaly Respiratory: clear to auscultation bilaterally, no wheezing, no crackles. Normal respiratory effort. No accessory muscle use.    Cardiovascular: Regular rate and rhythm, no murmurs / rubs / gallops. No extremity edema. 2+ pedal pulses. No carotid bruits.  Abdomen: epigastric tenderness Musculoskeletal: no clubbing / cyanosis. No joint deformity upper and lower extremities. Good ROM, no contractures. Normal muscle tone.  Skin: no rashes, lesions, ulcers. No induration Neurologic: CN 2-12 grossly intact. Sensation intact, DTR normal. Strength 5/5 in all 4.  Psychiatric: Normal judgment and insight. Alert and oriented x 3. Normal mood.    Labs on Admission: I have personally reviewed following labs and imaging studies  CBC: Recent Labs  Lab 01/01/18 2025  WBC 8.5  HGB 12.6*  HCT 35.1*  MCV 97.5  PLT 159   Basic Metabolic Panel: Recent Labs  Lab 01/01/18 2025  NA 132*  K 2.5*  CL 94*  CO2 17*  GLUCOSE 81  BUN 10  CREATININE 1.08  CALCIUM 8.7*   GFR: CrCl cannot be calculated (Unknown ideal weight.). Liver Function Tests: Recent Labs  Lab 01/01/18 2025  AST 368*  ALT 127*  ALKPHOS 146*  BILITOT 7.7*  PROT 6.5  ALBUMIN 3.2*   Recent Labs  Lab 01/01/18 2025  LIPASE 279*   No results for input(s): AMMONIA in the last 168 hours. Coagulation Profile: No results for input(s): INR, PROTIME in the last 168 hours. Cardiac Enzymes: No results for input(s): CKTOTAL, CKMB, CKMBINDEX, TROPONINI in the last 168 hours. BNP (last 3 results) No results for input(s): PROBNP in the last 8760 hours. HbA1C: No results for input(s): HGBA1C in the last 72 hours. CBG: No results for input(s): GLUCAP in the last 168 hours. Lipid Profile: No results for input(s): CHOL, HDL, LDLCALC, TRIG, CHOLHDL, LDLDIRECT in the last 72 hours. Thyroid Function Tests: No results for input(s): TSH, T4TOTAL, FREET4, T3FREE, THYROIDAB in the last 72 hours. Anemia Panel: No results for input(s): VITAMINB12, FOLATE, FERRITIN, TIBC, IRON, RETICCTPCT in the last 72 hours. Urine analysis:    Component Value Date/Time    COLORURINE YELLOW 07/21/2017 0425   APPEARANCEUR CLEAR 07/21/2017 0425   LABSPEC 1.020 07/21/2017 0425   PHURINE 5.0 07/21/2017 0425   GLUCOSEU 50 (A) 07/21/2017 0425   HGBUR SMALL (A) 07/21/2017 0425   BILIRUBINUR NEGATIVE 07/21/2017 0425   KETONESUR 80 (A) 07/21/2017 0425   PROTEINUR 100 (A) 07/21/2017 0425   NITRITE NEGATIVE 07/21/2017 0425   LEUKOCYTESUR NEGATIVE 07/21/2017 0425    Radiological Exams on Admission: Ct Abdomen Pelvis W Contrast  Result Date: 01/01/2018 CLINICAL DATA:  Nausea and vomiting.  Abdominal pain. EXAM: CT ABDOMEN AND PELVIS WITH CONTRAST TECHNIQUE: Multidetector CT imaging of the abdomen and pelvis was performed using the standard protocol following bolus administration of intravenous contrast. CONTRAST:  100mL ISOVUE-300 IOPAMIDOL (ISOVUE-300) INJECTION 61% COMPARISON:  02/26/2017 FINDINGS: Lower chest: The lung bases are clear. Hepatobiliary:  Advanced decreased hepatic density consistent with steatosis, with significant progression from prior exam. Gallbladder physiologically distended, no calcified stone. No biliary dilatation. Pancreas: Mild peripancreatic fat stranding about the pancreatic tail and possible head of the pancreas. No ductal dilatation. No evidence pancreatic necrosis. No evidence pancreatic mass. Spleen: Normal in size without focal abnormality. Adrenals/Urinary Tract: Normal adrenal glands. No hydronephrosis or perinephric edema. Homogeneous renal enhancement with symmetric excretion on delayed phase imaging. Urinary bladder is physiologically distended without wall thickening. Stomach/Bowel: Stomach is nondistended. No bowel wall thickening, obstruction or inflammatory change. Normal appendix. Enteric sutures in the sigmoid colon. Vascular/Lymphatic: Aortic atherosclerosis. No aneurysm. Splenic and portal veins are patent. Small periportal and upper retroperitoneal nodes are likely reactive. No pathologically enlarged lymph nodes in the abdomen or  pelvis. Reproductive: Prominent prostate gland spans 5.3 cm. Other: No free air, free fluid, or intra-abdominal fluid collection. Musculoskeletal: There are no acute or suspicious osseous abnormalities. IMPRESSION: 1. Mild peripancreatic fat stranding consistent with acute pancreatitis. No focal fluid collection or pancreatic necrosis. 2. Advanced hepatic steatosis, with significant progression over the past 10 months. 3.  Aortic Atherosclerosis (ICD10-I70.0). Electronically Signed   By: Rubye Oaks M.D.   On: 01/01/2018 22:56    EKG: Independently reviewed.  Assessment/Plan Principal Problem:   Acute alcoholic pancreatitis Active Problems:   Alcohol abuse   Atrial fibrillation (HCC)   Essential hypertension   Hypokalemia   Acute alcoholic hepatitis    1. Acute EtOH pancreatitis - and hepatitis 1. IVF: 2L bolus in ED, Banana bag x1 at 150 cc/hr, then D5half at 75 cc/hr 1. Possibly component of EtOH ketoacidosis given AG of 21 2. Checking lactate and beta-hydroxybuterate 2. PT-INR pending 3. Repeat CMP in AM 1. If worsens may wish to get US gallbladder 2. However no stones, no biliary dilation on CT 4. Pain ctrl with morphine PRN 5. Intake and output 2. EtOH abuse - with withdrawal symptoms on presentation, last drink not exactly clear when but seems like 24-72 hour range it sounds like 1. Improved with ativan 2. Putting him on CIWA 3. Hypokalemia - 1. Replacing IV K 2. Getting 2gm mag IV 3. Repeat CMP in AM 4. H/o A.Fib - 1. currently sinus rhythm at the moment 2. Will put on tele monitor 5. HTN - Holding home BP meds for the moment  DVT prophylaxis: Lovenox Code Status: Full Family Communication: No family in room Disposition Plan: Home after admit Consults called: None Admission status: Admit to inpatient   Hillary Bow DO [facility] Pager [phone]  If 7AM-7PM, please contact day team taking care of patient [email or web address] Password  [email or web address]  01/01/2018, 11:45 PM

## 2018-01-01 NOTE — ED Triage Notes (Signed)
Patient received IM Zofran 4mg  by EMS

## 2018-01-01 NOTE — ED Provider Notes (Signed)
Assumed care at shift change from Dr. Erma Heritage.  See prior notes for full H&P.  Briefly, 67 y.o. M here with abdominal pain.  Hx of alcoholism, has been on a bender for the past few days. Last drink 24 hours ago, has been vomiting for about a week.  Found with beer cans around him and brought here.  Multiple lab abnormalities noted and are being replaced.  Plan:  CT pending.  Will need admission.  Results for orders placed or performed during the hospital encounter of 01/01/18  Lipase, blood  Result Value Ref Range   Lipase 279 (H) 11 - 51 U/L  Comprehensive metabolic panel  Result Value Ref Range   Sodium 132 (L) 135 - 145 mmol/L   Potassium 2.5 (LL) 3.5 - 5.1 mmol/L   Chloride 94 (L) 101 - 111 mmol/L   CO2 17 (L) 22 - 32 mmol/L   Glucose, Bld 81 65 - 99 mg/dL   BUN 10 6 - 20 mg/dL   Creatinine, Ser 0.98 0.61 - 1.24 mg/dL   Calcium 8.7 (L) 8.9 - 10.3 mg/dL   Total Protein 6.5 6.5 - 8.1 g/dL   Albumin 3.2 (L) 3.5 - 5.0 g/dL   AST 119 (H) 15 - 41 U/L   ALT 127 (H) 17 - 63 U/L   Alkaline Phosphatase 146 (H) 38 - 126 U/L   Total Bilirubin 7.7 (H) 0.3 - 1.2 mg/dL   GFR calc non Af Amer >60 >60 mL/min   GFR calc Af Amer >60 >60 mL/min   Anion gap 21 (H) 5 - 15  CBC  Result Value Ref Range   WBC 8.5 4.0 - 10.5 K/uL   RBC 3.60 (L) 4.22 - 5.81 MIL/uL   Hemoglobin 12.6 (L) 13.0 - 17.0 g/dL   HCT 14.7 (L) 82.9 - 56.2 %   MCV 97.5 78.0 - 100.0 fL   MCH 35.0 (H) 26.0 - 34.0 pg   MCHC 35.9 30.0 - 36.0 g/dL   RDW 13.0 86.5 - 78.4 %   Platelets 159 150 - 400 K/uL  Ethanol  Result Value Ref Range   Alcohol, Ethyl (B) <10 <10 mg/dL   Ct Abdomen Pelvis W Contrast  Result Date: 01/01/2018 CLINICAL DATA:  Nausea and vomiting.  Abdominal pain. EXAM: CT ABDOMEN AND PELVIS WITH CONTRAST TECHNIQUE: Multidetector CT imaging of the abdomen and pelvis was performed using the standard protocol following bolus administration of intravenous contrast. CONTRAST:  ISOVUE-300 IOPAMIDOL (ISOVUE-300)  INJECTION 61% COMPARISON:  02/26/2017 FINDINGS: Lower chest: The lung bases are clear. Hepatobiliary: Advanced decreased hepatic density consistent with steatosis, with significant progression from prior exam. Gallbladder physiologically distended, no calcified stone. No biliary dilatation. Pancreas: Mild peripancreatic fat stranding about the pancreatic tail and possible head of the pancreas. No ductal dilatation. No evidence pancreatic necrosis. No evidence pancreatic mass. Spleen: Normal in size without focal abnormality. Adrenals/Urinary Tract: Normal adrenal glands. No hydronephrosis or perinephric edema. Homogeneous renal enhancement with symmetric excretion on delayed phase imaging. Urinary bladder is physiologically distended without wall thickening. Stomach/Bowel: Stomach is nondistended. No bowel wall thickening, obstruction or inflammatory change. Normal appendix. Enteric sutures in the sigmoid colon. Vascular/Lymphatic: Aortic atherosclerosis. No aneurysm. Splenic and portal veins are patent. Small periportal and upper retroperitoneal nodes are likely reactive. No pathologically enlarged lymph nodes in the abdomen or pelvis. Reproductive: Prominent prostate gland spans 5.3 cm. Other: No free air, free fluid, or intra-abdominal fluid collection. Musculoskeletal: There are no acute or suspicious osseous abnormalities. IMPRESSION: 1.  Mild peripancreatic fat stranding consistent with acute pancreatitis. No focal fluid collection or pancreatic necrosis. 2. Advanced hepatic steatosis, with significant progression over the past 10 months. 3.  Aortic Atherosclerosis (ICD10-I70.0). Electronically Signed   By: Melanie  Ehinger M.D.   On: 01/01/2018 22:56    CT fiRubye Oaksndings with acute pancreatitis.  Patient will be admitted for ongoing care.  Spoke with Dr. Beacher MayGardener-- he will admit.   Garlon HatchetSanders, Labrian Torregrossa M, PA-C 01/01/18 2350    Shaune PollackIsaacs, Cameron, MD 01/02/18 [phone]

## 2018-01-01 NOTE — ED Provider Notes (Addendum)
[facility] EMERGENCY DEPARTMENT Provider Note   CSN: [id number] Arrival date & time: 01/01/18  2009     History   Chief Complaint Chief Complaint  Patient presents with  . Abdominal Pain    HPI Alvin Benton is a 67 y.o. male.  HPI   67 yo M with h/o EtOH abuse, HTN, AFib, here with abd pain. Pt reports abd pain that is aching, gnawing, epigastric, x 1 week. Pt had been drinking EtOH regularly prior to onset of pain. Pain is worse with eating, palpation, and movement. No alleviating factors. No fevers. Pt has been vomiting his EtOH - last drink was 24 hours ago, but mostly vomited it. No CP, no diarrhea, no constipation.  Past Medical History:  Diagnosis Date  . Atrial fibrillation (HCC) 06/2017  . Blood in stool   . Diverticulitis   . ETOH abuse   . GERD (gastroesophageal reflux disease)   . Heart murmur   . History of urinary tract infection   . Hypertension   . IBS (irritable bowel syndrome)   . Migraine    "none in a long time" (03/14/2016)  . Nocturia   . Pneumonia    childhood  . Seasonal allergies   . Tobacco abuse     Patient Active Problem List   Diagnosis Date Noted  . Acute alcoholic pancreatitis 01/01/2018  . Acute alcoholic hepatitis 01/01/2018  . Elevated troponin   . Nausea & vomiting 07/21/2017  . Atrial fibrillation with RVR (HCC) 07/21/2017  . Alcoholic ketoacidosis 07/21/2017  . PNA (pneumonia): pROBABLE ASPIRATION 05/21/2017  . Hyponatremia 05/20/2017  . Dehydration 05/20/2017  . Syncope 02/26/2017  . History of atrial fibrillation 02/26/2017  . Right flank hematoma 02/26/2017  . Zygomatic fracture, right side, initial encounter for closed fracture (HCC) 02/26/2017  . Multiple rib fractures 02/26/2017  . Acute pulmonary edema (HCC)   . Dizziness   . Fall   . Dehydration with hyponatremia 02/20/2017  . Alcohol dependence in early full remission (HCC) 06/04/2016  . Volume depletion 06/04/2016  . Hypokalemia 06/04/2016    . Diverticulitis of intestine with perforation without bleeding 06/04/2016  . Essential hypertension   . Hypomagnesemia   . Ileus (HCC) 05/05/2016  . Systolic murmur 05/05/2016  . Anxiety state 04/19/2016  . 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 Hypoxia 04/05/2016  . Atrial fibrillation (HCC) 04/05/2016  . Diverticulitis of large intestine with perforation and abscess 03/14/2016  . Chronic constipation 03/08/2016  . Alcohol abuse 02/15/2016  . Tobacco use disorder 07/17/2015  . Allergic rhinitis 07/17/2015  . Reactive airway disease with wheezing 07/17/2015  . Overweight (BMI 25.0-29.9) 07/17/2015  . Hand pain, left 01/12/2014  . LLQ pain-chronic, intermittent and episodic 09/17/2013    Past Surgical History:  Procedure Laterality Date  . COLONOSCOPY    . CYSTOSCOPY WITH STENT PLACEMENT Left 10/03/2016   Procedure: CYSTOSCOPY WITH  LEFT URETERAL CATHETER PLACEMENT;  Surgeon: Bjorn Pippin, MD;  Location: [facility];  Service: Urology;  Laterality: Left;  . PROCTOSCOPY  10/03/2016   Procedure: PROCTOSCOPY;  Surgeon: Romie Levee, MD;  Location: [facility];  Service: General;;  . RIGHT/LEFT HEART CATH AND CORONARY ANGIOGRAPHY N/A 07/22/2017   Procedure: RIGHT/LEFT HEART CATH AND CORONARY ANGIOGRAPHY;  Surgeon: [place], Peter M, MD;  Location: [facility] INVASIVE CV LAB;  Service: Cardiovascular;  Laterality: N/A;  . TONSILLECTOMY         Home Medications    Prior to Admission medications   Medication Sig Start Date End  Date Taking? Authorizing Provider  Albuterol Sulfate 108 (90 Base) MCG/ACT AEPB Inhale 2 puffs into the lungs every 4 (four) hours as needed for shortness of breath. 02/11/17  Yes [provider]  hydrochlorothiazide (HYDRODIURIL) 25 MG tablet Take 25 mg by mouth daily. 06/25/17  Yes [provider]  valsartan (DIOVAN) 160 MG tablet Take 160 mg by mouth daily. 12/12/16  Yes [provider]  irbesartan (AVAPRO) 150 MG tablet Take 1 tablet (150 mg total) by mouth  daily. Patient not taking: Reported on 01/01/2018 06/06/17   Osvaldo Shipper, MD  metoprolol tartrate (LOPRESSOR) 25 MG tablet Take 1 tablet (25 mg total) by mouth 2 (two) times daily. 07/22/17 08/21/17  Arrien, York Ram, MD    Family History Family History  Problem Relation Age of Onset  . Stroke Mother   . Colon cancer Neg Hx   . Esophageal cancer Neg Hx   . Rectal cancer Neg Hx   . Stomach cancer Neg Hx     Social History Social History   Tobacco Use  . Smoking status: Current Every Day Smoker    Packs/day: 1.00    Years: 40.00    Pack years: 40.00    Types: Cigarettes  . Smokeless tobacco: Never Used  Substance Use Topics  . Alcohol use: Yes    Alcohol/week: 1.8 oz    Types: 3 Shots of liquor per week    Comment: pt states does not drink daily has beer with football  . Drug use: No     Allergies   Patient has no known allergies.   Review of Systems Review of Systems  Constitutional: Positive for fatigue. Negative for chills and fever.  HENT: Negative for congestion and rhinorrhea.   Eyes: Negative for visual disturbance.  Respiratory: Negative for cough, shortness of breath and wheezing.   Cardiovascular: Negative for chest pain and leg swelling.  Gastrointestinal: Positive for abdominal pain, nausea and vomiting. Negative for diarrhea.  Genitourinary: Negative for dysuria and flank pain.  Musculoskeletal: Negative for neck pain and neck stiffness.  Skin: Negative for rash and wound.  Allergic/Immunologic: Negative for immunocompromised state.  Neurological: Positive for weakness. Negative for syncope and headaches.  All other systems reviewed and are negative.    Physical Exam Updated Vital Signs BP 126/82   Pulse 80   Temp 98.4 F (36.9 C) (Oral)   Resp 16   SpO2 98%   Physical Exam  Constitutional: He is oriented to person, place, and time. He appears well-developed and well-nourished. He appears ill. No distress.  HENT:  Head:  Normocephalic and atraumatic.  Dry MM  Eyes: Conjunctivae are normal.  Neck: Neck supple.  Cardiovascular: Regular rhythm and normal heart sounds. Tachycardia present. Exam reveals no friction rub.  No murmur heard. Pulmonary/Chest: Effort normal and breath sounds normal. No respiratory distress. He has no wheezes. He has no rales.  Abdominal: Normal appearance and bowel sounds are normal. He exhibits no distension. There is generalized tenderness and tenderness in the epigastric area. There is guarding. There is no rigidity and no rebound.  Musculoskeletal: He exhibits no edema.  Neurological: He is alert and oriented to person, place, and time. He exhibits normal muscle tone.  Skin: Skin is warm. Capillary refill takes less than 2 seconds.  Psychiatric: He has a normal mood and affect.  Nursing note and vitals reviewed.    ED Treatments / Results  Labs (all labs ordered are listed, but only abnormal results are displayed) Labs Reviewed  LIPASE, BLOOD - Abnormal; Notable for the following components:      Result Value   Lipase 279 (*)    All other components within normal limits  COMPREHENSIVE METABOLIC PANEL - Abnormal; Notable for the following components:   Sodium 132 (*)    Potassium 2.5 (*)    Chloride 94 (*)    CO2 17 (*)    Calcium 8.7 (*)    Albumin 3.2 (*)    AST 368 (*)    ALT 127 (*)    Alkaline Phosphatase 146 (*)    Total Bilirubin 7.7 (*)    Anion gap 21 (*)    All other components within normal limits  CBC - Abnormal; Notable for the following components:   RBC 3.60 (*)    Hemoglobin 12.6 (*)    HCT 35.1 (*)    MCH 35.0 (*)    All other components within normal limits  ETHANOL  MAGNESIUM  LACTIC ACID, PLASMA  URINALYSIS, ROUTINE W REFLEX MICROSCOPIC  RAPID URINE DRUG SCREEN, HOSP PERFORMED  COMPREHENSIVE METABOLIC PANEL  LACTIC ACID, PLASMA  CBC  BETA-HYDROXYBUTYRIC ACID  PROTIME-INR    EKG  EKG Interpretation None       Radiology Ct  Abdomen Pelvis W Contrast  Result Date: 01/01/2018 CLINICAL DATA:  Nausea and vomiting.  Abdominal pain. EXAM: CT ABDOMEN AND PELVIS WITH CONTRAST TECHNIQUE: Multidetector CT imaging of the abdomen and pelvis was performed using the standard protocol following bolus administration of intravenous contrast. CONTRAST:  100mL ISOVUE-300 IOPAMIDOL (ISOVUE-300) INJECTION 61% COMPARISON:  02/26/2017 FINDINGS: Lower chest: The lung bases are clear. Hepatobiliary: Advanced decreased hepatic density consistent with steatosis, with significant progression from prior exam. Gallbladder physiologically distended, no calcified stone. No biliary dilatation. Pancreas: Mild peripancreatic fat stranding about the pancreatic tail and possible head of the pancreas. No ductal dilatation. No evidence pancreatic necrosis. No evidence pancreatic mass. Spleen: Normal in size without focal abnormality. Adrenals/Urinary Tract: Normal adrenal glands. No hydronephrosis or perinephric edema. Homogeneous renal enhancement with symmetric excretion on delayed phase imaging. Urinary bladder is physiologically distended without wall thickening. Stomach/Bowel: Stomach is nondistended. No bowel wall thickening, obstruction or inflammatory change. Normal appendix. Enteric sutures in the sigmoid colon. Vascular/Lymphatic: Aortic atherosclerosis. No aneurysm. Splenic and portal veins are patent. Small periportal and upper retroperitoneal nodes are likely reactive. No pathologically enlarged lymph nodes in the abdomen or pelvis. Reproductive: Prominent prostate gland spans 5.3 cm. Other: No free air, free fluid, or intra-abdominal fluid collection. Musculoskeletal: There are no acute or suspicious osseous abnormalities. IMPRESSION: 1. Mild peripancreatic fat stranding consistent with acute pancreatitis. No focal fluid collection or pancreatic necrosis. 2. Advanced hepatic steatosis, with significant progression over the past 10 months. 3.  Aortic  Atherosclerosis (ICD10-I70.0). Electronically Signed   By: Rubye OaksMelanie  Ehinger M.D.   On: 01/01/2018 22:56    Procedures .Critical Care Performed by: Shaune PollackIsaacs, Lyllie Cobbins, MD Authorized by: Shaune PollackIsaacs, Demba Nigh, MD   Critical care provider statement:    Critical care time (minutes):  35   Critical care was necessary to treat or prevent imminent or life-threatening deterioration of the following conditions:  Metabolic crisis, circulatory failure and dehydration    (including critical care time)  Medications Ordered in ED Medications  potassium chloride 10 mEq in 100 mL IVPB (10 mEq Intravenous Transfusing/Transfer 01/02/18 0023)  LORazepam (ATIVAN) tablet 1 mg (not administered)    Or  LORazepam (ATIVAN) injection 1 mg (not administered)  thiamine (VITAMIN B-1) tablet 100 mg (not administered)  Or  thiamine (B-1) injection 100 mg (not administered)  folic acid (FOLVITE) tablet 1 mg (not administered)  multivitamin with minerals tablet 1 tablet (not administered)  dextrose 5 %-0.45 % sodium chloride infusion ( Intravenous Transfusing/Transfer 01/02/18 0022)  acetaminophen (TYLENOL) tablet 650 mg (not administered)    Or  acetaminophen (TYLENOL) suppository 650 mg (not administered)  ondansetron (ZOFRAN) tablet 4 mg (not administered)    Or  ondansetron (ZOFRAN) injection 4 mg (not administered)  enoxaparin (LOVENOX) injection 40 mg (not administered)  morphine 4 MG/ML injection 2-4 mg (not administered)  sodium chloride 0.9 % bolus 1,000 mL (0 mLs Intravenous Stopped 01/01/18 2140)  LORazepam (ATIVAN) injection 2 mg (2 mg Intravenous Given 01/01/18 2107)  sodium chloride 0.9 % bolus 1,000 mL (0 mLs Intravenous Stopped 01/01/18 2357)  ondansetron (ZOFRAN) injection 4 mg (4 mg Intravenous Given 01/01/18 2107)  sodium chloride 0.9 % 1,000 mL with thiamine 100 mg, folic acid 1 mg, multivitamins adult 10 mL infusion ( Intravenous Transfusing/Transfer 01/02/18 0022)  sodium chloride 0.9 % bolus 1,000 mL (0  mLs Intravenous Stopped 01/01/18 2356)  magnesium sulfate IVPB 2 g 50 mL (0 g Intravenous Stopped 01/02/18 0004)  iopamidol (ISOVUE-300) 61 % injection (100 mLs  Contrast Given 01/01/18 2217)     Initial Impression / Assessment and Plan / ED Course  I have reviewed the triage vital signs and the nursing notes.  Pertinent labs & imaging results that were available during my care of the patient were reviewed by me and considered in my medical decision making (see chart for details).     67 yo M with PMHx chronic alcoholism here with n/v and abdominal pain. History, lab work is consistent with acute on chronic alcoholic pancreatitis, with suspected component of alcoholic hepatitis and alcoholic ketoacidosis. Will give fluids, analgesia, antiemetics, and CT. Pt markedly hypokalemic, requiring IV replacement. Will give empiric Mag as well in setting of chronic EtOH. Pt also with AST,ALT, Bili elevation - likely 2/2 alcoholic hepatitis, will f/u CT. AG acidosis is likely from dehydration and AKA. Pt also with some mild tremors, concern for early EtOH w/d. Would recommend close monitoring with high risk for DTs.  Final Clinical Impressions(s) / ED Diagnoses   Final diagnoses:  Alcohol-induced acute pancreatitis without infection or necrosis  Hypokalemia  Alcohol abuse  Transaminitis    ED Discharge Orders    None       Shaune Pollack, MD 01/02/18 1478    Shaune Pollack, MD 01/12/18 [phone]

## 2018-01-01 NOTE — ED Notes (Signed)
Patient transported to CT 

## 2018-01-01 NOTE — ED Notes (Signed)
Sent label to main lab to add mag blood test.

## 2018-01-01 NOTE — ED Triage Notes (Signed)
Patient brought by EMS for abdominal pain with persistent nausea and vomiting.  HX of HTN.  EMS stated there were many empty beer bottles in the residence.  Patient report last drink 2 days ago, abdominal pain has been present for last week, diarrhea started today.  Tremor seen by EMS can be felt with hands.

## 2018-01-02 ENCOUNTER — Inpatient Hospital Stay (HOSPITAL_COMMUNITY): Payer: Self-pay

## 2018-01-02 LAB — COMPREHENSIVE METABOLIC PANEL
ALBUMIN: 2.6 g/dL — AB (ref 3.5–5.0)
ALT: 101 U/L — AB (ref 17–63)
AST: 284 U/L — AB (ref 15–41)
Alkaline Phosphatase: 109 U/L (ref 38–126)
Anion gap: 15 (ref 5–15)
BILIRUBIN TOTAL: 4.8 mg/dL — AB (ref 0.3–1.2)
BUN: 8 mg/dL (ref 6–20)
CHLORIDE: 101 mmol/L (ref 101–111)
CO2: 17 mmol/L — ABNORMAL LOW (ref 22–32)
CREATININE: 0.88 mg/dL (ref 0.61–1.24)
Calcium: 7.2 mg/dL — ABNORMAL LOW (ref 8.9–10.3)
GFR calc Af Amer: >60 mL/min (ref >60)
GLUCOSE: 72 mg/dL (ref 65–99)
Potassium: 2.8 mmol/L — ABNORMAL LOW (ref 3.5–5.1)
Sodium: 133 mmol/L — ABNORMAL LOW (ref 135–145)
TOTAL PROTEIN: 5.2 g/dL — AB (ref 6.5–8.1)

## 2018-01-02 LAB — LACTIC ACID, PLASMA
Lactic Acid, Venous: 0.9 mmol/L (ref 0.5–1.9)
Lactic Acid, Venous: 0.7 mmol/L (ref 0.5–1.9)

## 2018-01-02 LAB — BASIC METABOLIC PANEL
ANION GAP: 11 (ref 5–15)
BUN: 6 mg/dL (ref 6–20)
CO2: 22 mmol/L (ref 22–32)
Calcium: 7.4 mg/dL — ABNORMAL LOW (ref 8.9–10.3)
Chloride: 100 mmol/L — ABNORMAL LOW (ref 101–111)
Creatinine, Ser: 0.73 mg/dL (ref 0.61–1.24)
Glucose, Bld: 123 mg/dL — ABNORMAL HIGH (ref 65–99)
POTASSIUM: 2.3 mmol/L — AB (ref 3.5–5.1)
SODIUM: 133 mmol/L — AB (ref 135–145)

## 2018-01-02 LAB — URINALYSIS, ROUTINE W REFLEX MICROSCOPIC
Bilirubin Urine: NEGATIVE
GLUCOSE, UA: NEGATIVE mg/dL
Ketones, ur: 20 mg/dL — AB
Leukocytes, UA: NEGATIVE
Nitrite: NEGATIVE
Protein, ur: NEGATIVE mg/dL
Specific Gravity, Urine: 1.04 — ABNORMAL HIGH (ref 1.005–1.030)
pH: 6 (ref 5.0–8.0)

## 2018-01-02 LAB — CBC
HEMATOCRIT: 29.1 % — AB (ref 39.0–52.0)
HEMOGLOBIN: 10.4 g/dL — AB (ref 13.0–17.0)
MCH: 35.3 pg — AB (ref 26.0–34.0)
MCHC: 35.7 g/dL (ref 30.0–36.0)
MCV: 98.6 fL (ref 78.0–100.0)
PLATELETS: 129 10*3/uL — AB (ref 150–400)
RBC: 2.95 MIL/uL — AB (ref 4.22–5.81)
RDW: 15.2 % (ref 11.5–15.5)
WBC: 5.7 10*3/uL (ref 4.0–10.5)

## 2018-01-02 LAB — PROTIME-INR
INR: 1.21
Prothrombin Time: 15.2 seconds (ref 11.4–15.2)

## 2018-01-02 LAB — RAPID URINE DRUG SCREEN, HOSP PERFORMED
AMPHETAMINES: NOT DETECTED
BARBITURATES: NOT DETECTED
BENZODIAZEPINES: NOT DETECTED
Cocaine: NOT DETECTED
Opiates: NOT DETECTED
Tetrahydrocannabinol: NOT DETECTED

## 2018-01-02 LAB — MAGNESIUM: MAGNESIUM: 1.8 mg/dL (ref 1.7–2.4)

## 2018-01-02 LAB — BETA-HYDROXYBUTYRIC ACID: Beta-Hydroxybutyric Acid: 5.84 mmol/L — ABNORMAL HIGH (ref 0.05–0.27)

## 2018-01-02 MED ORDER — POTASSIUM CHLORIDE CRYS ER 20 MEQ PO TBCR
40.0000 meq | EXTENDED_RELEASE_TABLET | Freq: Three times a day (TID) | ORAL | Status: AC
  Administered 2018-01-02 (×3): 40 meq via ORAL
  Filled 2018-01-02 (×3): qty 2

## 2018-01-02 NOTE — Care Management Note (Addendum)
Case Management Note  Patient Details  Name: Alvin Benton MRN: [id number] Date of Birth: April 04, 1951  Subjective/Objective:  Pt presented for abdominal pain, nausea and vomiting- found to have acute pancreatitis. Hx of ETOH Abuse on CIWA protocol. Pt is without Insurance and just recently lost his job.                   Action/Plan: [facility]- Appointment placed on the AVS. Pt will be [facility] at d/c M-F for medication assistance. No further needs from CM at this time.    Expected Discharge Date:                  Expected Discharge Plan:  Home/Self Care  In-House Referral:  NA  Discharge planning Services  CM Consult, Follow-up appt scheduled, [facility], Medication Assistance  Post Acute Care Choice:  NA Choice offered to:  NA  DME Arranged:  N/A DME Agency:  NA  HH Arranged:  NA HH Agency:  NA  Status of Service:  Completed, signed off  If discussed at Long Length of Stay Meetings, dates discussed:    Additional Comments: 1511 01-07-18 Tomi BambergerBrenda Graves-Bigelow, RN,BSN [phone] CM did speak with pt along with CSW- plan will go to SNF x 1 week private pay and then see if pt can return home. No further needs from CM at this time.    1147 01-06-18 Tomi BambergerBrenda Graves-Bigelow, RN,BSN [phone] CM did talk with patient and he lives alone in an apartment. PT recommendations for SNF- Per FC pt has assets and money in the bank-living off his 401K. CSW to speak with patient in regards to SNF. LOG will not be able to be utilized. CM will need to offer choice for home.  Gala LewandowskyGraves-Bigelow, Alvin Gilliam Kaye, RN 01/02/2018, 2:33 PM

## 2018-01-02 NOTE — Progress Notes (Addendum)
PROGRESS NOTE  [name] OEH:[id number] DOB: 22-Jul-1951 DOA: 01/01/2018 PCP: Chesley Mires, MD  HPI/Recap of past 24 hours:  Alvin Benton is a 67 y.o. year old male with medical history significant for alcohol abuse, HTN, atrial fibrillation who presented on 01/01/2018 with abdominal pain, nausea and vomiting and was found to have acute pancreatitis.    Interval History Overnight, patient reports he is doing better. Abdominal pain has improved and nausea and vomiting has resolved. Patient is hungry and asking for food.   Assessment/Plan: Principal Problem:   Acute alcoholic pancreatitis Active Problems:   Alcohol abuse   Atrial fibrillation (HCC)   Essential hypertension   Hypokalemia   Acute alcoholic hepatitis   #Acute alcohol pancreatitis, improving Patient has a significant history of alcohol abuse, presented with abdominal pain, nausea and vomiting for the past few days. Patient reports that last drink was 3 days ago. CT abdomen showed fat stranding around pancreas consistent with acute pancreatitis. Elevated Lipase 279. Pain is controlled this morning.  --Continue morphine prn --Advance diet to full liquid --Zofran for nausea  --Prn tylenol as needed  --Continue Folic acid and thiamine  #Hepatic steatosis, chronic, worsening On admission patient with significant transaminitis with AST 368, ALT 127, alk phos 146 and T Bili of 7.7. Overnight transaminitis has improved. CT abdomen showed significant worsening of steatosis consistent with patient history of alcohol abuse. No overt sign of cirrhosis.  --Will continue to trend liver function --Follow up on am   #Anion gap metabolic acidosis Patient presented with a metabolic anion gap acidosis with an AG 21 and elevated beta hydroxybutyric 5.84 likely in the setting of alcohol abuse. Lactate was 0.9. This morning  AG was 15 with a down trending LA 0.7. Will continue to monitor. Bicarb is unchanged at 17. --Will continue to  monitor  #Anemia Likely secondary to history of alcohol abuse 12.6>>10.4. Drop likely dilutional. Patient show no signs of active bleeding and does not have a history of esophageal varices. --Will continue to monitor  #Hypokalemia, improving Patient presented with a K+ of 2.5. Patient has been getting supplementation IV since hospitalization. Profound hypokalemia likely secondary to poor po intake prior to admission and multiple episodes of emesis. --Repeat BMP this afternoon --Follow on am BMP -- Replete as needed  #Alcohol abuse Patient reports that last drink was 3 days ago. He endorses symptoms of withdrawal like tremors and diaphoresis at home for the past two days but is now feeling better. No history of DT. Given extensive history will continue to closely monitor.  --CIWA protocol (Ativan)  --Thiamine, folate and multivitamins  #Atria Fibrillation Patient had a recent hospital admission when he was treated for at atrial flutter/SVT and started on metoprolol 25 mg. Patient was previously on coreg and aspirin and was not a candidate for anticoagulation due to his history of alcohol abuse and multiple falls in the past. Currently patient is NSR. CHADVasc of 1.  --Metoprolol is currently being held  --Will continue to monitor and resume as needed   #Hypertension  On admission patient BP was 153/87. Patient was normotensive most of the night. Currently BP is 153/80. BP meds were held on admission. Home regimen HCTZ, valsartan  --Will resume BP meds as needed.  Code Status: Full  Family Communication: No family members at bedside  Disposition Plan: Home pending clinical improvement   Consultants:  None  Procedures:  None  Antimicrobials:  None  Cultures:  None  Telemetry:  DVT prophylaxis: None  Objective: Vitals:   01/02/18 0000 01/02/18 0100 01/02/18 0430 01/02/18 1043  BP: 126/82 136/89 131/64 (!) 153/80  Pulse: 80 82 86 96  Resp: '16 16 18   '$ Temp:   98.2 F (36.8 C) 98.3 F (36.8 C)   TempSrc:  Oral Oral   SpO2: 98% 98% 100% 99%  Weight:  166 lb 7.2 oz (75.5 kg) 167 lb 1.7 oz (75.8 kg)     Intake/Output Summary (Last 24 hours) at 01/02/2018 1122 Last data filed at 01/02/2018 0510 Gross per 24 hour  Intake 1000 ml  Output 300 ml  Net 700 ml   Filed Weights   01/02/18 0100 01/02/18 0430  Weight: 166 lb 7.2 oz (75.5 kg) 167 lb 1.7 oz (75.8 kg)    Exam:  General: Lying in bed,no apparent distres Eyes: EOMI, anicteric ENT: Oral Mucosa clear and moist Neck: normal, no thyromegaly Cardiovascular: regular rate and rhythm, no murmurs, rubs or gallops, no JVD or edema Respiratory: Normal respiratory effort on room air, lungs clear to auscultation bilaterally Abdomen: soft, non-distended, tenderness on palpation in the epigastric and periumbilical region Skin: No Rash Musculoskeletal:Good ROM, no contractures. Normal muscle tone Neurologic: Grossly no focal neuro deficit.Mental status AAOx3 Psychiatric:Appropriate affect, and mood  Data Reviewed: CBC: Recent Labs  Lab 01/01/18 2025 01/02/18 0258  WBC 8.5 5.7  HGB 12.6* 10.4*  HCT 35.1* 29.1*  MCV 97.5 98.6  PLT 159 300*   Basic Metabolic Panel: Recent Labs  Lab 01/01/18 2025 01/01/18 2346 01/02/18 0258  NA 132*  --  133*  K 2.5*  --  2.8*  CL 94*  --  101  CO2 17*  --  17*  GLUCOSE 81  --  72  BUN 10  --  8  CREATININE 1.08  --  0.88  CALCIUM 8.7*  --  7.2*  MG  --  1.8  --    GFR: Estimated Creatinine Clearance: 84.8 mL/min (by C-G formula based on SCr of 0.88 mg/dL). Liver Function Tests: Recent Labs  Lab 01/01/18 2025 01/02/18 0258  AST 368* 284*  ALT 127* 101*  ALKPHOS 146* 109  BILITOT 7.7* 4.8*  PROT 6.5 5.2*  ALBUMIN 3.2* 2.6*   Recent Labs  Lab 01/01/18 2025  LIPASE 279*   No results for input(s): AMMONIA in the last 168 hours. Coagulation Profile: Recent Labs  Lab 01/02/18 0258  INR 1.21   Cardiac Enzymes: No results for  input(s): CKTOTAL, CKMB, CKMBINDEX, TROPONINI in the last 168 hours. BNP (last 3 results) No results for input(s): PROBNP in the last 8760 hours. HbA1C: No results for input(s): HGBA1C in the last 72 hours. CBG: No results for input(s): GLUCAP in the last 168 hours. Lipid Profile: No results for input(s): CHOL, HDL, LDLCALC, TRIG, CHOLHDL, LDLDIRECT in the last 72 hours. Thyroid Function Tests: No results for input(s): TSH, T4TOTAL, FREET4, T3FREE, THYROIDAB in the last 72 hours. Anemia Panel: No results for input(s): VITAMINB12, FOLATE, FERRITIN, TIBC, IRON, RETICCTPCT in the last 72 hours. Urine analysis:    Component Value Date/Time   COLORURINE AMBER (A) 01/02/2018 0515   APPEARANCEUR CLEAR 01/02/2018 0515   LABSPEC 1.040 (H) 01/02/2018 0515   PHURINE 6.0 01/02/2018 0515   GLUCOSEU NEGATIVE 01/02/2018 0515   HGBUR SMALL (A) 01/02/2018 0515   BILIRUBINUR NEGATIVE 01/02/2018 0515   KETONESUR 20 (A) 01/02/2018 0515   PROTEINUR NEGATIVE 01/02/2018 0515   NITRITE NEGATIVE 01/02/2018 0515   LEUKOCYTESUR NEGATIVE 01/02/2018 0515   Sepsis Labs: '@LABRCNTIP'$ (procalcitonin:4,lacticidven:4)  )  No results found for this or any previous visit (from the past 240 hour(s)).    Studies: Ct Abdomen Pelvis W Contrast  Result Date: 01/01/2018 CLINICAL DATA:  Nausea and vomiting.  Abdominal pain. EXAM: CT ABDOMEN AND PELVIS WITH CONTRAST TECHNIQUE: Multidetector CT imaging of the abdomen and pelvis was performed using the standard protocol following bolus administration of intravenous contrast. CONTRAST:  147m ISOVUE-300 IOPAMIDOL (ISOVUE-300) INJECTION 61% COMPARISON:  02/26/2017 FINDINGS: Lower chest: The lung bases are clear. Hepatobiliary: Advanced decreased hepatic density consistent with steatosis, with significant progression from prior exam. Gallbladder physiologically distended, no calcified stone. No biliary dilatation. Pancreas: Mild peripancreatic fat stranding about the pancreatic tail  and possible head of the pancreas. No ductal dilatation. No evidence pancreatic necrosis. No evidence pancreatic mass. Spleen: Normal in size without focal abnormality. Adrenals/Urinary Tract: Normal adrenal glands. No hydronephrosis or perinephric edema. Homogeneous renal enhancement with symmetric excretion on delayed phase imaging. Urinary bladder is physiologically distended without wall thickening. Stomach/Bowel: Stomach is nondistended. No bowel wall thickening, obstruction or inflammatory change. Normal appendix. Enteric sutures in the sigmoid colon. Vascular/Lymphatic: Aortic atherosclerosis. No aneurysm. Splenic and portal veins are patent. Small periportal and upper retroperitoneal nodes are likely reactive. No pathologically enlarged lymph nodes in the abdomen or pelvis. Reproductive: Prominent prostate gland spans 5.3 cm. Other: No free air, free fluid, or intra-abdominal fluid collection. Musculoskeletal: There are no acute or suspicious osseous abnormalities. IMPRESSION: 1. Mild peripancreatic fat stranding consistent with acute pancreatitis. No focal fluid collection or pancreatic necrosis. 2. Advanced hepatic steatosis, with significant progression over the past 10 months. 3.  Aortic Atherosclerosis (ICD10-I70.0). Electronically Signed   By: MJeb LeveringM.D.   On: 01/01/2018 22:56   UKoreaAbdomen Limited  Result Date: 01/02/2018 CLINICAL DATA:  Right upper quadrant pain for 1 week EXAM: ULTRASOUND ABDOMEN LIMITED RIGHT UPPER QUADRANT COMPARISON:  01/01/2018 FINDINGS: Gallbladder: Gallbladder is well distended. No definitive cholelithiasis is seen. Mild gallbladder sludge is noted. No pericholecystic fluid is noted. Common bile duct: Diameter: 4.5 mm. Liver: Diffuse increased echogenicity consistent with fatty infiltration. No focal mass lesion is noted. Portal vein is patent on color Doppler imaging with normal direction of blood flow towards the liver. IMPRESSION: Gallbladder sludge without  cholelithiasis. Fatty liver. Electronically Signed   By: MInez CatalinaM.D.   On: 01/02/2018 09:07    Scheduled Meds: . folic acid  1 mg Oral Daily  . multivitamin with minerals  1 tablet Oral Daily  . thiamine  100 mg Oral Daily   Or  . thiamine  100 mg Intravenous Daily    Continuous Infusions: . dextrose 5 % and 0.45% NaCl 75 mL/hr at 01/02/18 0009     LOS: 1 day     AMarjie Skiff MD [facility] Pager [phone] If 7PM-7AM, please contact night-coverage [email or web address] Password [email or web address] 01/02/2018, 11:22 AM

## 2018-01-02 NOTE — Plan of Care (Signed)
  Education: Knowledge of General Education information will improve 01/02/2018 0228 - Progressing by Olena Materobinson, Macon Sandiford G, RN Note POC and orders reviewed with pt.; little drowsy.

## 2018-01-02 NOTE — Progress Notes (Signed)
Pt. transported via stretcher from ER to 6E- 11; drowsy and alert and oriented x3 to person, place and situation; forgetful with time. Oriented to call button and room- pt. very quiet.

## 2018-01-02 NOTE — Progress Notes (Signed)
CRITICAL VALUE ALERT  Critical Value: K 2.3  Date & Time Notied:  01-02-18, @1450   Provider Notified: Dr. Mal MistyNetty  Orders Received/Actions taken: New order received to supplement with PO potassium.

## 2018-01-03 ENCOUNTER — Other Ambulatory Visit: Payer: Self-pay

## 2018-01-03 DIAGNOSIS — R1013 Epigastric pain: Secondary | ICD-10-CM

## 2018-01-03 DIAGNOSIS — F1029 Alcohol dependence with unspecified alcohol-induced disorder: Secondary | ICD-10-CM

## 2018-01-03 DIAGNOSIS — R740 Nonspecific elevation of levels of transaminase and lactic acid dehydrogenase [LDH]: Secondary | ICD-10-CM

## 2018-01-03 DIAGNOSIS — R17 Unspecified jaundice: Secondary | ICD-10-CM

## 2018-01-03 LAB — COMPREHENSIVE METABOLIC PANEL
ALT: 97 U/L — ABNORMAL HIGH (ref 17–63)
AST: 241 U/L — AB (ref 15–41)
Albumin: 2.4 g/dL — ABNORMAL LOW (ref 3.5–5.0)
Alkaline Phosphatase: 120 U/L (ref 38–126)
Anion gap: 10 (ref 5–15)
CO2: 24 mmol/L (ref 22–32)
Calcium: 7.8 mg/dL — ABNORMAL LOW (ref 8.9–10.3)
Chloride: 100 mmol/L — ABNORMAL LOW (ref 101–111)
Creatinine, Ser: 0.59 mg/dL — ABNORMAL LOW (ref 0.61–1.24)
Glucose, Bld: 202 mg/dL — ABNORMAL HIGH (ref 65–99)
POTASSIUM: 2.8 mmol/L — AB (ref 3.5–5.1)
Sodium: 134 mmol/L — ABNORMAL LOW (ref 135–145)
Total Bilirubin: 3.8 mg/dL — ABNORMAL HIGH (ref 0.3–1.2)
Total Protein: 5.2 g/dL — ABNORMAL LOW (ref 6.5–8.1)

## 2018-01-03 LAB — LIPASE, BLOOD: LIPASE: 356 U/L — AB (ref 11–51)

## 2018-01-03 LAB — MAGNESIUM: MAGNESIUM: 1.5 mg/dL — AB (ref 1.7–2.4)

## 2018-01-03 LAB — CBC
HCT: 28.7 % — ABNORMAL LOW (ref 39.0–52.0)
Hemoglobin: 10.6 g/dL — ABNORMAL LOW (ref 13.0–17.0)
MCH: 36.4 pg — ABNORMAL HIGH (ref 26.0–34.0)
MCHC: 36.9 g/dL — ABNORMAL HIGH (ref 30.0–36.0)
MCV: 98.6 fL (ref 78.0–100.0)
Platelets: 166 10*3/uL (ref 150–400)
RBC: 2.91 MIL/uL — ABNORMAL LOW (ref 4.22–5.81)
RDW: 15 % (ref 11.5–15.5)
WBC: 4.2 10*3/uL (ref 4.0–10.5)

## 2018-01-03 LAB — POTASSIUM: POTASSIUM: 3.2 mmol/L — AB (ref 3.5–5.1)

## 2018-01-03 LAB — AMMONIA: AMMONIA: 54 umol/L — AB (ref 9–35)

## 2018-01-03 MED ORDER — ZOLPIDEM TARTRATE 5 MG PO TABS
5.0000 mg | ORAL_TABLET | Freq: Once | ORAL | Status: AC
  Administered 2018-01-03: 5 mg via ORAL
  Filled 2018-01-03: qty 1

## 2018-01-03 MED ORDER — LORAZEPAM 2 MG/ML IJ SOLN
1.0000 mg | Freq: Once | INTRAMUSCULAR | Status: AC
  Administered 2018-01-03: 1 mg via INTRAVENOUS
  Filled 2018-01-03: qty 1

## 2018-01-03 MED ORDER — LACTULOSE 10 GM/15ML PO SOLN
20.0000 g | Freq: Three times a day (TID) | ORAL | Status: DC
  Administered 2018-01-03 – 2018-01-04 (×4): 20 g via ORAL
  Filled 2018-01-03 (×5): qty 30

## 2018-01-03 MED ORDER — MAGNESIUM SULFATE 2 GM/50ML IV SOLN
2.0000 g | Freq: Once | INTRAVENOUS | Status: AC
  Administered 2018-01-03: 2 g via INTRAVENOUS
  Filled 2018-01-03: qty 50

## 2018-01-03 MED ORDER — ENSURE ENLIVE PO LIQD
237.0000 mL | Freq: Two times a day (BID) | ORAL | Status: DC
  Administered 2018-01-03 – 2018-01-07 (×4): 237 mL via ORAL

## 2018-01-03 MED ORDER — POTASSIUM CHLORIDE CRYS ER 20 MEQ PO TBCR
40.0000 meq | EXTENDED_RELEASE_TABLET | Freq: Two times a day (BID) | ORAL | Status: AC
  Administered 2018-01-03 (×2): 40 meq via ORAL
  Filled 2018-01-03 (×2): qty 2

## 2018-01-03 MED ORDER — METOPROLOL TARTRATE 25 MG PO TABS
25.0000 mg | ORAL_TABLET | Freq: Two times a day (BID) | ORAL | Status: DC
  Administered 2018-01-03 – 2018-01-07 (×9): 25 mg via ORAL
  Filled 2018-01-03 (×8): qty 1

## 2018-01-03 NOTE — Progress Notes (Signed)
Initial Nutrition Assessment  DOCUMENTATION CODES:   Not applicable  INTERVENTION:   -Continue Ensure Enlive po BID, each supplement provides 350 kcal and 20 grams of protein  -Continue MVI  NUTRITION DIAGNOSIS:   Inadequate oral intake related to acute illness as evidenced by per patient/family report.  GOAL:   Patient will meet greater than or equal to 90% of their needs  MONITOR:   PO intake, Supplement acceptance, Labs, Weight trends  REASON FOR ASSESSMENT:   Malnutrition Screening Tool    ASSESSMENT:    67 yo male admitted with acute pancreatitis and alcoholic hepatitis. Pt with hx of EtOH abuse, HTN, diverticulitis, GERD, IBS   Diet advanced to Regular today; pt tolerating thus far, ate good lunch meal. Tolerating Ensure. Pt reports no abdominal pain, N/V. Pt reports poor intake for 1 week PTA, barely able to tolerate sips of water. Prior to this, pt with good appetite, eating well  Pt reports weight has been stable  Labs: potassium 3.2, lipase 356, corrected calcium 9.1 (albumin 2.4) Meds: MVI, folic acid, KCL, thiamine  NUTRITION - FOCUSED PHYSICAL EXAM:    Most Recent Value  Orbital Region  No depletion  Upper Arm Region  No depletion  Thoracic and Lumbar Region  No depletion  Buccal Region  No depletion  Temple Region  No depletion  Clavicle Bone Region  Mild depletion  Clavicle and Acromion Bone Region  Mild depletion  Scapular Bone Region  No depletion  Dorsal Hand  No depletion  Patellar Region  Unable to assess  Anterior Thigh Region  Unable to assess  Posterior Calf Region  Unable to assess  Edema (RD Assessment)  None       Diet Order:  Diet regular Room service appropriate? Yes; Fluid consistency: Thin  EDUCATION NEEDS:   No education needs have been identified at this time  Skin:  Skin Assessment: Reviewed RN Assessment  Last BM:  3/8  Height:   Ht Readings from Last 1 Encounters:  01/03/18 5\' 6"  (1.676 m)    Weight:   Wt  Readings from Last 1 Encounters:  01/03/18 172 lb 2.9 oz (78.1 kg)    Ideal Body Weight:     BMI:  Body mass index is 27.79 kg/m.  Estimated Nutritional Needs:   Kcal:  1950-2340 kcals  Protein:  95-115 g  Fluid:  >/= 2 L   Romelle Starcherate Puneet Masoner MS, RD, LDN, CNSC [phone] Pager  [phone] Weekend/On-Call Pager

## 2018-01-03 NOTE — Progress Notes (Signed)
PROGRESS NOTE  Alvin Benton NWG:[id number] DOB: November 26, 1950 DOA: 01/01/2018 PCP: Marilynn Rail, MD  HPI/Recap of past 24 hours:  Alvin Benton is a 67 y.o. year old male with medical history significant for alcohol abuse, hypertension, paroxysmal atrial fibrillation who presented on 01/01/18 with acute onset abdominal pain, nausea and vomiting was found to have acute pancreatitis and alcoholic hepatitis.   Interval History When okay this morning.  Denies any current abdominal pain.  Reports doing well with liquid diet will like to advance  Assessment/Plan: Principal Problem:   Acute alcoholic pancreatitis Active Problems:   Alcohol abuse   Atrial fibrillation (HCC)   Essential hypertension   Hypokalemia   Acute alcoholic hepatitis  #Acute pancreatitis, suspect alcohol induced, improving No stone pathology on right upper quadrant ultrasound Status post aggressive fluid resuscitation -Tolerating liquid diet, advance diet as tolerated -Repeat lipase  #Elevated transaminases and hyperbilirubinemia, likely secondary to alcoholic hepatitis, improving LFTs and hyperbilirubinemia improving Right upper quadrant ultrasound shows distended gallbladder with hepatic steatosis with no signs of cholelithiasis AST twice the value of ALT, consistent with alcoholic hepatitis -Follow-up hepatitis panel -monitor on daily CMP  #Ethanol dependence, stable,  Some anxiety and slight sinus tachycardia States last drink several weeks ago Ethanol less than 10 -Monitor on CIWA protocol with telemetry, as needed Ativan per CIWA protocol -Continue supplementation with folic acid and thiamine  #Hypokalemia and hypomagnesemia In setting of increased nausea/vomiting and alcohol dependence -Continue repletion as needed  #Anemia, suspect chronic disease related to all dependence (some liver pathology) disease, stable -Continue to monitor on CBC  #Hypertension, at goal Several blood pressure medications  listed on current med rec (2 ARB's, metoprolol, HCTZ) -Will need accurate medication reconciliation -We will start metoprolol 25 twice daily (home medication, confirmed on chart review) disease  Code Status: Full code  Family Communication: No family at bedside  Disposition Plan: Improving lipase, continue tolerating diet, avoiding withdrawal symptoms   Consultants:  None  Procedures:  None  Antimicrobials:  None  Cultures:  None  Telemetry: Yes  DVT prophylaxis: SCDs given low platelets on admission   Objective: Vitals:   01/03/18 0128 01/03/18 0351 01/03/18 0550 01/03/18 0822  BP:  136/72  (!) 156/82  Pulse: (!) 106 85 (!) 102 (!) 110  Resp:  17    Temp:  100 F (37.8 C)    TempSrc:  Oral    SpO2:  98%  99%  Weight:  78.1 kg (172 lb 2.9 oz)    Height:        Intake/Output Summary (Last 24 hours) at 01/03/2018 1339 Last data filed at 01/03/2018 1133 Gross per 24 hour  Intake 1400 ml  Output 4650 ml  Net -3250 ml   Filed Weights   01/02/18 0430 01/03/18 0023 01/03/18 0351  Weight: 75.8 kg (167 lb 1.7 oz) 75.8 kg (167 lb 1.7 oz) 78.1 kg (172 lb 2.9 oz)    Exam:  General: Gentleman who looks stated age, comfortably lying in bed, Eyes: Scleral icterus ENT: Oral Mucosa clear and moist Cardiovascular: regular rate and rhythm, no murmurs, rubs or gallops, no edema Respiratory: Normal respiratory effort on room air, lungs clear to auscultation bilaterally Abdomen: soft, non-distended, non-tender, normal bowel sounds Skin: No Rash Musculoskeletal:Good ROM, no contractures. Normal muscle tone Neurologic: Grossly no focal neuro deficit.Mental status AAOx3 Psychiatric: Slow in speech but appropriate affect, and mood  Data Reviewed: CBC: Recent Labs  Lab 01/01/18 2025 01/02/18 0258 01/03/18 0416  WBC 8.5 5.7 4.2  HGB 12.6* 10.4* 10.6*  HCT 35.1* 29.1* 28.7*  MCV 97.5 98.6 98.6  PLT 159 129* 166   Basic Metabolic Panel: Recent Labs  Lab  01/01/18 2025 01/01/18 2346 01/02/18 0258 01/02/18 1327 01/03/18 0416 01/03/18 0723  NA 132*  --  133* 133* 134*  --   K 2.5*  --  2.8* 2.3* 2.8*  --   CL 94*  --  101 100* 100*  --   CO2 17*  --  17* 22 24  --   GLUCOSE 81  --  72 123* 202*  --   BUN 10  --  8 6 <5*  --   CREATININE 1.08  --  0.88 0.73 0.59*  --   CALCIUM 8.7*  --  7.2* 7.4* 7.8*  --   MG  --  1.8  --   --   --  1.5*   GFR: Estimated Creatinine Clearance: 91.7 mL/min (A) (by C-G formula based on SCr of 0.59 mg/dL (L)). Liver Function Tests: Recent Labs  Lab 01/01/18 2025 01/02/18 0258 01/03/18 0416  AST 368* 284* 241*  ALT 127* 101* 97*  ALKPHOS 146* 109 120  BILITOT 7.7* 4.8* 3.8*  PROT 6.5 5.2* 5.2*  ALBUMIN 3.2* 2.6* 2.4*   Recent Labs  Lab 01/01/18 2025  LIPASE 279*   No results for input(s): AMMONIA in the last 168 hours. Coagulation Profile: Recent Labs  Lab 01/02/18 0258  INR 1.21   Cardiac Enzymes: No results for input(s): CKTOTAL, CKMB, CKMBINDEX, TROPONINI in the last 168 hours. BNP (last 3 results) No results for input(s): PROBNP in the last 8760 hours. HbA1C: No results for input(s): HGBA1C in the last 72 hours. CBG: No results for input(s): GLUCAP in the last 168 hours. Lipid Profile: No results for input(s): CHOL, HDL, LDLCALC, TRIG, CHOLHDL, LDLDIRECT in the last 72 hours. Thyroid Function Tests: No results for input(s): TSH, T4TOTAL, FREET4, T3FREE, THYROIDAB in the last 72 hours. Anemia Panel: No results for input(s): VITAMINB12, FOLATE, FERRITIN, TIBC, IRON, RETICCTPCT in the last 72 hours. Urine analysis:    Component Value Date/Time   COLORURINE AMBER (A) 01/02/2018 0515   APPEARANCEUR CLEAR 01/02/2018 0515   LABSPEC 1.040 (H) 01/02/2018 0515   PHURINE 6.0 01/02/2018 0515   GLUCOSEU NEGATIVE 01/02/2018 0515   HGBUR SMALL (A) 01/02/2018 0515   BILIRUBINUR NEGATIVE 01/02/2018 0515   KETONESUR 20 (A) 01/02/2018 0515   PROTEINUR NEGATIVE 01/02/2018 0515   NITRITE  NEGATIVE 01/02/2018 0515   LEUKOCYTESUR NEGATIVE 01/02/2018 0515   Sepsis Labs: @LABRCNTIP (procalcitonin:4,lacticidven:4)  )No results found for this or any previous visit (from the past 240 hour(s)).    Studies: No results found.  Scheduled Meds: . feeding supplement (ENSURE ENLIVE)  237 mL Oral BID BM  . folic acid  1 mg Oral Daily  . multivitamin with minerals  1 tablet Oral Daily  . potassium chloride  40 mEq Oral BID  . thiamine  100 mg Oral Daily   Or  . thiamine  100 mg Intravenous Daily    Continuous Infusions:   LOS: 2 days     Laverna PeaceShayla D Edilberto Roosevelt, MD [facility] Pager [phone]  If 7PM-7AM, please contact night-coverage [email or web address] Password [email or web address] 01/03/2018, 1:39 PM

## 2018-01-03 NOTE — Progress Notes (Addendum)
Patient is feeling very anxious at this time.    He states why he is still in here and that he feels that he is not improving.  His thoughts are about needing to do so many things but he can't because he is in the hospital.  I have given him Ativan through IV and let him know that he will need to try to close his eyes and relax a little bit.    Patient took Ambien but has not been able to sleep through the night.  I will keep monitoring patient.  Notify MD due to CIWA has been >10 for 2 consecutive assessment.  MD order 1 mg more of Ativan to be given to patient.  Will keep monitoring patient.

## 2018-01-03 NOTE — Progress Notes (Signed)
Patient's feeling very anxious and agitated tonight.  He states that he has so much to do after d/c and does not know why he is still in the hospital and not getting better, with this being his 3rd night.  He has requested something for sleep so he cannot think of all these thoughts.    I have text MD and order for Ambien was put in.  I will keep monitoring the patient.

## 2018-01-04 DIAGNOSIS — E7220 Disorder of urea cycle metabolism, unspecified: Secondary | ICD-10-CM

## 2018-01-04 DIAGNOSIS — F1010 Alcohol abuse, uncomplicated: Secondary | ICD-10-CM

## 2018-01-04 LAB — COMPREHENSIVE METABOLIC PANEL
ALBUMIN: 2.2 g/dL — AB (ref 3.5–5.0)
ALK PHOS: 117 U/L (ref 38–126)
ALT: 98 U/L — AB (ref 17–63)
AST: 217 U/L — AB (ref 15–41)
Anion gap: 8 (ref 5–15)
BUN: <5 mg/dL — ABNORMAL LOW (ref 6–20)
CALCIUM: 8.1 mg/dL — AB (ref 8.9–10.3)
CO2: 24 mmol/L (ref 22–32)
CREATININE: 0.57 mg/dL — AB (ref 0.61–1.24)
Chloride: 101 mmol/L (ref 101–111)
GFR calc Af Amer: >60 mL/min (ref >60)
GFR calc non Af Amer: >60 mL/min (ref >60)
GLUCOSE: 146 mg/dL — AB (ref 65–99)
Potassium: 2.9 mmol/L — ABNORMAL LOW (ref 3.5–5.1)
SODIUM: 133 mmol/L — AB (ref 135–145)
Total Bilirubin: 2.4 mg/dL — ABNORMAL HIGH (ref 0.3–1.2)
Total Protein: 5 g/dL — ABNORMAL LOW (ref 6.5–8.1)

## 2018-01-04 LAB — HEPATITIS PANEL, ACUTE
HEP A IGM: NEGATIVE
HEP B C IGM: NEGATIVE
Hepatitis B Surface Ag: NEGATIVE

## 2018-01-04 LAB — MAGNESIUM: Magnesium: 1.5 mg/dL — ABNORMAL LOW (ref 1.7–2.4)

## 2018-01-04 LAB — LIPASE, BLOOD: Lipase: 629 U/L — ABNORMAL HIGH (ref 11–51)

## 2018-01-04 MED ORDER — POTASSIUM CHLORIDE CRYS ER 20 MEQ PO TBCR
40.0000 meq | EXTENDED_RELEASE_TABLET | ORAL | Status: AC
  Administered 2018-01-04 (×2): 40 meq via ORAL
  Filled 2018-01-04 (×2): qty 2

## 2018-01-04 MED ORDER — PANTOPRAZOLE SODIUM 40 MG PO TBEC
40.0000 mg | DELAYED_RELEASE_TABLET | Freq: Every day | ORAL | Status: DC
  Administered 2018-01-05 – 2018-01-07 (×3): 40 mg via ORAL
  Filled 2018-01-04 (×3): qty 1

## 2018-01-04 MED ORDER — LORAZEPAM 2 MG/ML IJ SOLN
1.0000 mg | Freq: Four times a day (QID) | INTRAMUSCULAR | Status: AC | PRN
  Administered 2018-01-05: 1 mg via INTRAVENOUS
  Filled 2018-01-04: qty 1

## 2018-01-04 MED ORDER — SODIUM CHLORIDE 0.9 % IV SOLN
INTRAVENOUS | Status: AC
  Administered 2018-01-04 – 2018-01-05 (×3): via INTRAVENOUS

## 2018-01-04 MED ORDER — LORAZEPAM 1 MG PO TABS
1.0000 mg | ORAL_TABLET | Freq: Four times a day (QID) | ORAL | Status: AC | PRN
  Administered 2018-01-05: 1 mg via ORAL
  Filled 2018-01-04: qty 1

## 2018-01-04 NOTE — Consult Note (Addendum)
[facility] Consult: 12:04 PM 01/04/2018  LOS: 3 days    Referring Provider: Dr Lisbeth Ply  Primary Care Physician:  Chesley Mires, MD vs Thornton Dales MD at [facility]  Primary Gastroenterologist:  Dr. Carlean Purl    Reason for Consultation:  Alcoholic with "high ammonia"   HPI: Alvin Benton is a 67 y.o. male.  Hx alcohol abuse.  Anxiety, panic attacks.  Hyponatremia 04/2017.  Constipation. HTN.   PNA (aspiration?) 04/2017.  Acute sigmoid diverticulitis 02/2016.  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  To address colovesical fistula: underwent pre-operative left ureteral catheterization and dye infusion (to allow for intraoperative ureteral identification) followed by sigmoid resection 09/2016.  Afib 03/2016, RVR 06/2016.  Not on AC.  Normal vessels on cardiac cath 06/2017.  Right AC joint separation 02/2017.   Right flank hematoma but no liver, pancreatic, biliary pathology on CT 02/2017.   Pleural effusion.  Hep ABC, HIV negative in 12/2017.  Mod to severe brain volume loss, mild small vsl dz on MRI 02/2017.   10/2013 Colonoscopy for rectal bleeding, abd pain.  Moderate sigmoid tics; small int rrhoids felt to be source.  At baseline patient has morning nausea several days a week.  Often accompanied by dry heaving and rarely by nonbloody emesis.  Symptoms get better when he stands and walks around and abate over a few hours.  He does not have abdominal pain but dises get some heartburn.  In the past he has tried ranitidine but it did not help, neither did antacids.  Not clear that he is ever tried PPI therapy.  When asked how much alcohol he drinks he says "not that much".  He admits to consuming 750 ml of rum every 2-3 days.  He spent 30 days at [employer] within the past couple of years.  His  longest sobriety was 33 days, 30 of it was at [employer].  He says AA is a cult, which turns him off.   As far as BMs go, they are brown.  He takes Metamucil regularly and most days has at least one bowel movement.  Has not seen any blood per rectum in a long time. Patient has not seen his primary care physician for 6 months because he lost his job and with it his medical insurance in October 2018.  He worked as an Editor, commissioning. Family history pertinent for an alcoholic mother who may have had cirrhosis.  He said she had multiple medical problems.  Not clear if she ever had pancreatitis. Patient lives alone.  Has no close by family.  But does have a few close friends who live in [place].  Since his sigmoid colectomy in 2017 For about a week prior to admission patient had gnawing, aching epigastric pain and progressive fatigue and weakness.  Increased nausea and vomiting though no coffee-ground or bloody emesis.  Symptoms worse with eating, movement, palpation.  He presented to the emergency room for evaluation 01/01/18.   Pt now feeling better, nausea present but better if he stands/sits up.  Tolerating solids >24  hours.  Abdominal pain nearly gone.  Lipase 279 >> 356. T bili 3.8 >> 2.4.  Alk phos 120 >> 117.  AST/ALT 241/97 >> 217/98.  Ammonia 54.  Low Na 133.  K 2.9.  No renal dysfunction.   Hgb 12.6 >> 10.6.  Was 14.7 - 11.7 in 06/2017.  MCV 98.  Platelets 129 >> 166.   Coags ok   ETOH <10.    CT ab/pelvis with contrast: mild pancreatitis at tail and possibly head.  Hepatic steatosis, progressed from prior.  Distended but o/w normal GB. No biliary ductal dilatation.    US abdomen: 4.5 mm CBD.  Distended GB, minor sludge, no pericholecystic edema.  Diffuse fatty liver.  Patent PV.         Past Medical History:  Diagnosis Date  . Atrial fibrillation ([place]) 06/2017  . Blood in stool   . Diverticulitis   . ETOH abuse   . GERD (gastroesophageal reflux disease)   . Heart murmur   .  History of urinary tract infection   . Hypertension   . IBS (irritable bowel syndrome)   . Migraine    "none in a long time" (03/14/2016)  . Nocturia   . Pneumonia    childhood  . Seasonal allergies   . Tobacco abuse     Past Surgical History:  Procedure Laterality Date  . COLONOSCOPY    . CYSTOSCOPY WITH STENT PLACEMENT Left 10/03/2016   Procedure: CYSTOSCOPY WITH  LEFT URETERAL CATHETER PLACEMENT;  Surgeon: Irine Seal, MD;  Location: [facility];  Service: Urology;  Laterality: Left;  . PROCTOSCOPY  10/03/2016   Procedure: PROCTOSCOPY;  Surgeon: Leighton Ruff, MD;  Location: [facility];  Service: General;;  . RIGHT/LEFT HEART CATH AND CORONARY ANGIOGRAPHY N/A 07/22/2017   Procedure: RIGHT/LEFT HEART CATH AND CORONARY ANGIOGRAPHY;  Surgeon: Martinique, Peter M, MD;  Location: [facility];  Service: Cardiovascular;  Laterality: N/A;  . TONSILLECTOMY      Prior to Admission medications   Medication Sig Start Date End Date Taking? Authorizing Provider  Albuterol Sulfate 108 (90 Base) MCG/ACT AEPB Inhale 2 puffs into the lungs every 4 (four) hours as needed for shortness of breath. 02/11/17  Yes [provider]  hydrochlorothiazide (HYDRODIURIL) 25 MG tablet Take 25 mg by mouth daily. 06/25/17  Yes [provider]  valsartan (DIOVAN) 160 MG tablet Take 160 mg by mouth daily. 12/12/16  Yes [provider]  irbesartan (AVAPRO) 150 MG tablet Take 1 tablet (150 mg total) by mouth daily. Patient not taking: Reported on 01/01/2018 06/06/17   Bonnielee Haff, MD  metoprolol tartrate (LOPRESSOR) 25 MG tablet Take 1 tablet (25 mg total) by mouth 2 (two) times daily. 07/22/17 08/21/17  Arrien, Jimmy Picket, MD    Scheduled Meds: . feeding supplement (ENSURE ENLIVE)  237 mL Oral BID BM  . folic acid  1 mg Oral Daily  . lactulose  20 g Oral TID  . metoprolol tartrate  25 mg Oral BID  . multivitamin with minerals  1 tablet Oral Daily  . thiamine  100 mg Oral Daily   Or  .  thiamine  100 mg Intravenous Daily   Infusions: . sodium chloride 100 mL/hr at 01/04/18 1029   PRN Meds: acetaminophen **OR** acetaminophen, LORazepam **OR** LORazepam, ondansetron **OR** ondansetron (ZOFRAN) IV   Allergies as of 01/01/2018  . (No Known Allergies)    Family History  Problem Relation Age of Onset  . Stroke Mother   . Colon cancer  Neg Hx   . Esophageal cancer Neg Hx   . Rectal cancer Neg Hx   . Stomach cancer Neg Hx     Social History   Socioeconomic History  . Marital status: Single    Spouse name: Not on file  . Number of children: 1  . Years of education: Not on file  . Highest education level: Not on file  Social Needs  . Financial resource strain: Not on file  . Food insecurity - worry: Not on file  . Food insecurity - inability: Not on file  . Transportation needs - medical: Not on file  . Transportation needs - non-medical: Not on file  Occupational History  . Occupation: Inventory     Employer: [employer]  Tobacco Use  . Smoking status: Current Every Day Smoker    Packs/day: 1.00    Years: 40.00    Pack years: 40.00    Types: Cigarettes  . Smokeless tobacco: Never Used  Substance and Sexual Activity  . Alcohol use: Yes    Alcohol/week: 1.8 oz    Types: 3 Shots of liquor per week    Comment: pt states does not drink daily has beer with football  . Drug use: No  . Sexual activity: Not Currently  Other Topics Concern  . Not on file  Social History Narrative   Area and one daughter   Editor, commissioning at [employer]   One caffeinated beverage a day    REVIEW OF SYSTEMS: Constitutional: Weakness, fatigue.  No falls for several months. ENT: Occasional epistaxis may be once a month.  None in the past few weeks. Pulm: No trouble breathing or cough. CV:  No palpitations.  No chest pain.  + LE edema.  The edema on the left foot is chronic and is a result of a skiing related non-fracture injury to the left leg GU:  No  hematuria, no frequency GI:  Per HPI Heme: Bruises easily.  Denies excessive bleeding. Transfusions: No previous blood transfusions. Neuro:  No headaches, no peripheral tingling or numbness.   Derm:  No itching, no rash or sores.  Endocrine:  No sweats or chills.  No polyuria or dysuria Immunization: Did not inquire as to whether he had a flu shot in the last few months.  He had his pneumococcal vaccine in July 2017 Travel:  None beyond local counties in last few months.    PHYSICAL EXAM: Vital signs in last 24 hours: Vitals:   01/04/18 0642 01/04/18 0954  BP: (!) 147/79 (!) 152/80  Pulse: 84 88  Resp:    Temp:    SpO2: 98%    Wt Readings from Last 3 Encounters:  01/04/18 179 lb 14.3 oz (81.6 kg)  07/21/17 165 lb 2 oz (74.9 kg)  06/05/17 165 lb 12.6 oz (75.2 kg)    General: Patient does not look ill.  He is slightly overweight WM who is comfortable.  Somewhat reluctant historian. Head: No facial asymmetry or swelling.  No signs of head trauma. Eyes: No scleral icterus.  No conjunctival pallor.  EOMI. Ears: No hearing deficit grossly. Nose: No nasal congestion or discharge. Mouth: Moist, pink oral mucosa.  Tongue midline.  Tongue has a deep tan stain on the upper surface Neck: No JVD, no masses, no thyromegaly. Lungs: Clear to auscultation bilaterally.  Good breath sounds.  No cough.  No dyspnea. Heart: RRR.  No MRG.  S1, S2 present. Abdomen: Soft.  Not tender, not distended.  No HSM, masses,  bruits, hernias..   Rectal: Deferred rectal exam. Musc/Skeltl: No joint swelling, contractures or redness. Extremities: Slight, 1+ edema on the ankles/feet slightly more pronounced on the left.  Feet are warm with good perfusion. Neurologic: Patient is alert.  Oriented x3.  Tremors in his hands, at points there was a pill-rolling quality to the tremor other time it was just more of a shaking as would be expected in alcohol withdrawal. Skin: No telangiectasia, sores, rashes.  Several  bruises and lesser purpura on the upper extremities. Tattoos: None Nodes: No cervical adenopathy. Psych: Patient not anxious but appears depressed and disinterested.  Cooperative.  Speech fluid.  Intake/Output from previous day: 03/09 0701 - 03/10 0700 In: 1260 [P.O.:1260] Out: 3050 [Urine:3050] Intake/Output this shift: Total I/O In: 220 [P.O.:220] Out: 520 [Urine:520]  LAB RESULTS: Recent Labs    01/01/18 2025 01/02/18 0258 01/03/18 0416  WBC 8.5 5.7 4.2  HGB 12.6* 10.4* 10.6*  HCT 35.1* 29.1* 28.7*  PLT 159 129* 166   BMET Lab Results  Component Value Date   NA 133 (L) 01/04/2018   NA 134 (L) 01/03/2018   NA 133 (L) 01/02/2018   K 2.9 (L) 01/04/2018   K 3.2 (L) 01/03/2018   K 2.8 (L) 01/03/2018   CL 101 01/04/2018   CL 100 (L) 01/03/2018   CL 100 (L) 01/02/2018   CO2 24 01/04/2018   CO2 24 01/03/2018   CO2 22 01/02/2018   GLUCOSE 146 (H) 01/04/2018   GLUCOSE 202 (H) 01/03/2018   GLUCOSE 123 (H) 01/02/2018   BUN <5 (L) 01/04/2018   BUN <5 (L) 01/03/2018   BUN 6 01/02/2018   CREATININE 0.57 (L) 01/04/2018   CREATININE 0.59 (L) 01/03/2018   CREATININE 0.73 01/02/2018   CALCIUM 8.1 (L) 01/04/2018   CALCIUM 7.8 (L) 01/03/2018   CALCIUM 7.4 (L) 01/02/2018   LFT Recent Labs    01/02/18 0258 01/03/18 0416 01/04/18 0543  PROT 5.2* 5.2* 5.0*  ALBUMIN 2.6* 2.4* 2.2*  AST 284* 241* 217*  ALT 101* 97* 98*  ALKPHOS 109 120 117  BILITOT 4.8* 3.8* 2.4*   PT/INR Lab Results  Component Value Date   INR 1.21 01/02/2018   INR 0.95 07/21/2017   INR 1.07 05/06/2016   Hepatitis Panel Recent Labs    01/03/18 0936  HEPBSAG Negative  HCVAB <0.1  HEPAIGM Negative  HEPBIGM Negative   C-Diff No components found for: CDIFF Lipase     Component Value Date/Time   LIPASE 356 (H) 01/03/2018 1351    Drugs of Abuse     Component Value Date/Time   LABOPIA NONE DETECTED 01/02/2018 0515   COCAINSCRNUR NONE DETECTED 01/02/2018 0515   LABBENZ NONE DETECTED  01/02/2018 0515   AMPHETMU NONE DETECTED 01/02/2018 0515   THCU NONE DETECTED 01/02/2018 0515   LABBARB NONE DETECTED 01/02/2018 0515     RADIOLOGY STUDIES: No results found.   IMPRESSION:   *   Acute alcoholic pancreatitis.  Mild, improved despite rising Lipase. Marland Kitchen GB Sludge only on imaging.  Tolerating solid, regular diet since Saturday, yesterday, lunch.  *  Alcoholic fatty liver.  Elevated LFTs c/w alc hepatitis.  Discriminant fz score 20 (no role for Prednisolone) Coags ok. Slight increase ammonia.    *   Normocytic anemia.  *   "High Ammonia".  At 54 it is at most moderately abnormal.  I do not see signs of HE.  Given he needs/uses Metamucil for conatipation almost daily, would be an easy switch to  replace with Lactulose.  However pt  Reminded me he has no insurance, is waiting on Medicare to kick in at 67.  But until then, cost of meds in this unemployed individual is an issue.    *  Hx paroxysmal A fib.  Not on AC.  Based on [place] vasc score of 1, hx non-compliance and risk for GIB, cardiology treated with ASA only.      PLAN:     *   Do not need to follow ammonia or lipase as he is clinically improving.   I emphasized ETOH abstinence, encouraged him to try different AA meetings.    *  If he can stay sober, his chronic nausea and dry heaves may resolve.  No worrisome issues such as drastic weight loss, bloody emesis so not clear EGD needs done.  However, he has suffered drop in Hgb which, if persists, might warrant EGD.  He is up to date on routine risk screening colonoscopy.    Added Protonix 40 mg daily.     Azucena Freed  01/04/2018, 12:04 PM Pager: [phone]

## 2018-01-04 NOTE — Progress Notes (Signed)
PROGRESS NOTE  Alvin Benton ZOX:[id number] DOB: 1951-06-24 DOA: 01/01/2018 PCP: Marilynn Rail, MD  HPI/Recap of past 24 hours:  Alvin Benton is a 67 y.o. year old male with medical history significant for alcohol abuse, hypertension, paroxysmal atrial fibrillation who presented on 01/01/18 with acute onset abdominal pain, nausea and vomiting was found to have acute pancreatitis and alcoholic hepatitis.   Interval History No acute complaints.  No abdominal pain.  Says he still feels a bit confused.  Admits he lives by himself and that prior to coming in he has felt very weak  Assessment/Plan: Principal Problem:   Acute alcoholic pancreatitis Active Problems:   Alcohol abuse   Atrial fibrillation (HCC)   Essential hypertension   Hypokalemia   Acute alcoholic hepatitis  #Acute pancreatitis, suspect alcohol induced, improving Lipase elevated at 356, not having abdominal pain but also not much of an appetite No stone pathology on right upper quadrant ultrasound Status post aggressive fluid resuscitation -Continue liquid diet, advance diet as tolerated -restart IV fluids 100 cc/h x 24 hours  #Elevated transaminases and hyperbilirubinemia, likely secondary to alcoholic hepatitis, improving  suspect alcoholic hepatitis (AST twice the value ALT) Abdominal ultrasound: Distended gallbladder with hepatic steatosis and no signs of cholelithiasis Hepatitis panel negative - Daily CMP   #Hyperammonemia concern for hepatic encephalopathy Ultrasound shows no evidence of cirrhosis, but with alcohol hepatitis suspect this can lead to some elevation in ammonia Asterixis present on exam - Ammonia 54 -Continue lactulose 20 mg 3 times daily to have 2-3 bowel movements - GI consult, may need to add prednisone for presumed alcoholic hepatitis -Will titrate up if not responding  #Ethanol dependence, stable,  He will dynamically stable, no overt Estelle June States last drink several weeks ago Ethanol  less than 10 -Monitor on CIWA protocol with telemetry, as needed Ativan per CIWA protocol -Continue supplementation with folic acid and thiamine  #Hypokalemia and hypomagnesemia, persistent In setting of increased nausea/vomiting from admitting acute pancreatitis and alcohol dependence -Daily BMP and mag -Continue repletion as needed  #Anemia, suspect chronic disease related to all dependence (some liver pathology) disease, stable -Continue to monitor on CBC  #Hypertension, at goal -Currently holding home HCTZ and ARB -Continue home metoprolol 25 twice daily    Code Status: Full code  Family Communication: No family at bedside  Disposition Plan:Still poor po intake, still confused concern for hepatic encephalopathy setting of hyperammonemia and alcoholic hepatitis, awaiting GI recommendations, IV fluids   Consultants: Gastroenterology  Procedures:  None  Antimicrobials:  None  Cultures:  None  Telemetry: Yes  DVT prophylaxis: SCDs given initiallylow platelets on admission   Objective: Vitals:   01/03/18 2154 01/04/18 0453 01/04/18 0642 01/04/18 0954  BP: (!) 149/83 119/67 (!) 147/79 (!) 152/80  Pulse: 94 85 84 88  Resp:      Temp:  99.1 F (37.3 C)    TempSrc:  Oral    SpO2:  97% 98%   Weight:  81.6 kg (179 lb 14.3 oz)    Height:        Intake/Output Summary (Last 24 hours) at 01/04/2018 1111 Last data filed at 01/04/2018 1029 Gross per 24 hour  Intake 1020 ml  Output 3570 ml  Net -2550 ml   Filed Weights   01/03/18 0023 01/03/18 0351 01/04/18 0453  Weight: 75.8 kg (167 lb 1.7 oz) 78.1 kg (172 lb 2.9 oz) 81.6 kg (179 lb 14.3 oz)    Exam:  General: Gentleman who looks stated age, comfortably lying  in bed, Eyes: Scleral icterus ENT: Oral Mucosa dry Cardiovascular: regular rate and rhythm, no murmurs, rubs or gallops, no edema Respiratory: Normal respiratory effort on room air, lungs clear to auscultation bilaterally Abdomen: soft,  non-distended, non-tender, normal bowel sounds Skin: No Rash Neurologic: Mental status AAOx3,asterixis present Psychiatric: Slow in speech but appropriate affect, and mood  Data Reviewed: CBC: Recent Labs  Lab 01/01/18 2025 01/02/18 0258 01/03/18 0416  WBC 8.5 5.7 4.2  HGB 12.6* 10.4* 10.6*  HCT 35.1* 29.1* 28.7*  MCV 97.5 98.6 98.6  PLT 159 129* 166   Basic Metabolic Panel: Recent Labs  Lab 01/01/18 2025 01/01/18 2346 01/02/18 0258 01/02/18 1327 01/03/18 0416 01/03/18 0723 01/03/18 1351 01/04/18 0543  NA 132*  --  133* 133* 134*  --   --  133*  K 2.5*  --  2.8* 2.3* 2.8*  --  3.2* 2.9*  CL 94*  --  101 100* 100*  --   --  101  CO2 17*  --  17* 22 24  --   --  24  GLUCOSE 81  --  72 123* 202*  --   --  146*  BUN 10  --  8 6 <5*  --   --  <5*  CREATININE 1.08  --  0.88 0.73 0.59*  --   --  0.57*  CALCIUM 8.7*  --  7.2* 7.4* 7.8*  --   --  8.1*  MG  --  1.8  --   --   --  1.5*  --   --    GFR: Estimated Creatinine Clearance: 93.5 mL/min (A) (by C-G formula based on SCr of 0.57 mg/dL (L)). Liver Function Tests: Recent Labs  Lab 01/01/18 2025 01/02/18 0258 01/03/18 0416 01/04/18 0543  AST 368* 284* 241* 217*  ALT 127* 101* 97* 98*  ALKPHOS 146* 109 120 117  BILITOT 7.7* 4.8* 3.8* 2.4*  PROT 6.5 5.2* 5.2* 5.0*  ALBUMIN 3.2* 2.6* 2.4* 2.2*   Recent Labs  Lab 01/01/18 2025 01/03/18 1351  LIPASE 279* 356*   Recent Labs  Lab 01/03/18 1729  AMMONIA 54*   Coagulation Profile: Recent Labs  Lab 01/02/18 0258  INR 1.21   Cardiac Enzymes: No results for input(s): CKTOTAL, CKMB, CKMBINDEX, TROPONINI in the last 168 hours. BNP (last 3 results) No results for input(s): PROBNP in the last 8760 hours. HbA1C: No results for input(s): HGBA1C in the last 72 hours. CBG: No results for input(s): GLUCAP in the last 168 hours. Lipid Profile: No results for input(s): CHOL, HDL, LDLCALC, TRIG, CHOLHDL, LDLDIRECT in the last 72 hours. Thyroid Function Tests: No  results for input(s): TSH, T4TOTAL, FREET4, T3FREE, THYROIDAB in the last 72 hours. Anemia Panel: No results for input(s): VITAMINB12, FOLATE, FERRITIN, TIBC, IRON, RETICCTPCT in the last 72 hours. Urine analysis:    Component Value Date/Time   COLORURINE AMBER (A) 01/02/2018 0515   APPEARANCEUR CLEAR 01/02/2018 0515   LABSPEC 1.040 (H) 01/02/2018 0515   PHURINE 6.0 01/02/2018 0515   GLUCOSEU NEGATIVE 01/02/2018 0515   HGBUR SMALL (A) 01/02/2018 0515   BILIRUBINUR NEGATIVE 01/02/2018 0515   KETONESUR 20 (A) 01/02/2018 0515   PROTEINUR NEGATIVE 01/02/2018 0515   NITRITE NEGATIVE 01/02/2018 0515   LEUKOCYTESUR NEGATIVE 01/02/2018 0515   Sepsis Labs: @LABRCNTIP (procalcitonin:4,lacticidven:4)  )No results found for this or any previous visit (from the past 240 hour(s)).    Studies: No results found.  Scheduled Meds: . feeding supplement (ENSURE ENLIVE)  237 mL Oral  BID BM  . folic acid  1 mg Oral Daily  . lactulose  20 g Oral TID  . metoprolol tartrate  25 mg Oral BID  . multivitamin with minerals  1 tablet Oral Daily  . thiamine  100 mg Oral Daily   Or  . thiamine  100 mg Intravenous Daily    Continuous Infusions: . sodium chloride 100 mL/hr at 01/04/18 1029     LOS: 3 days     Laverna Peace, MD [facility] Pager [phone]  If 7PM-7AM, please contact night-coverage [email or web address] Password [email or web address] 01/04/2018, 11:11 AM

## 2018-01-05 DIAGNOSIS — K852 Alcohol induced acute pancreatitis without necrosis or infection: Principal | ICD-10-CM

## 2018-01-05 DIAGNOSIS — R945 Abnormal results of liver function studies: Secondary | ICD-10-CM

## 2018-01-05 DIAGNOSIS — D649 Anemia, unspecified: Secondary | ICD-10-CM

## 2018-01-05 DIAGNOSIS — K7010 Alcoholic hepatitis without ascites: Secondary | ICD-10-CM

## 2018-01-05 DIAGNOSIS — K760 Fatty (change of) liver, not elsewhere classified: Secondary | ICD-10-CM

## 2018-01-05 LAB — CBC
HCT: 32.2 % — ABNORMAL LOW (ref 39.0–52.0)
Hemoglobin: 10.8 g/dL — ABNORMAL LOW (ref 13.0–17.0)
MCH: 34 pg (ref 26.0–34.0)
MCHC: 33.5 g/dL (ref 30.0–36.0)
MCV: 101.3 fL — ABNORMAL HIGH (ref 78.0–100.0)
PLATELETS: 276 10*3/uL (ref 150–400)
RBC: 3.18 MIL/uL — AB (ref 4.22–5.81)
RDW: 15.5 % (ref 11.5–15.5)
WBC: 6.3 10*3/uL (ref 4.0–10.5)

## 2018-01-05 LAB — COMPREHENSIVE METABOLIC PANEL
ALT: 93 U/L — AB (ref 17–63)
AST: 186 U/L — AB (ref 15–41)
Albumin: 2.2 g/dL — ABNORMAL LOW (ref 3.5–5.0)
Alkaline Phosphatase: 110 U/L (ref 38–126)
Anion gap: 9 (ref 5–15)
CALCIUM: 8.1 mg/dL — AB (ref 8.9–10.3)
CO2: 24 mmol/L (ref 22–32)
CREATININE: 0.46 mg/dL — AB (ref 0.61–1.24)
Chloride: 103 mmol/L (ref 101–111)
GFR calc non Af Amer: >60 mL/min (ref >60)
Glucose, Bld: 121 mg/dL — ABNORMAL HIGH (ref 65–99)
Potassium: 3.6 mmol/L (ref 3.5–5.1)
SODIUM: 136 mmol/L (ref 135–145)
Total Bilirubin: 2 mg/dL — ABNORMAL HIGH (ref 0.3–1.2)
Total Protein: 5.2 g/dL — ABNORMAL LOW (ref 6.5–8.1)

## 2018-01-05 LAB — MAGNESIUM: MAGNESIUM: 1.6 mg/dL — AB (ref 1.7–2.4)

## 2018-01-05 MED ORDER — HYDROCHLOROTHIAZIDE 25 MG PO TABS
25.0000 mg | ORAL_TABLET | Freq: Every day | ORAL | Status: DC
  Administered 2018-01-05 – 2018-01-07 (×3): 25 mg via ORAL
  Filled 2018-01-05 (×3): qty 1

## 2018-01-05 MED ORDER — LACTULOSE 10 GM/15ML PO SOLN
20.0000 g | Freq: Two times a day (BID) | ORAL | Status: DC
  Administered 2018-01-05 – 2018-01-06 (×3): 20 g via ORAL
  Filled 2018-01-05 (×4): qty 30

## 2018-01-05 MED ORDER — ALUM & MAG HYDROXIDE-SIMETH 200-200-20 MG/5ML PO SUSP
15.0000 mL | ORAL | Status: DC | PRN
  Administered 2018-01-05: 15 mL via ORAL
  Filled 2018-01-05: qty 30

## 2018-01-05 MED ORDER — LORAZEPAM 1 MG PO TABS
1.0000 mg | ORAL_TABLET | Freq: Four times a day (QID) | ORAL | Status: AC | PRN
  Administered 2018-01-05: 1 mg via ORAL
  Filled 2018-01-05: qty 1

## 2018-01-05 MED ORDER — LORAZEPAM 2 MG/ML IJ SOLN
1.0000 mg | Freq: Four times a day (QID) | INTRAMUSCULAR | Status: AC | PRN
  Filled 2018-01-05: qty 1

## 2018-01-05 NOTE — Progress Notes (Addendum)
Daily Rounding Note  01/05/2018, 9:35 AM  LOS: 4 days   SUBJECTIVE:   Denies abd pain, N/V.  2 "good-sized" stools yesterday.  Continues to tolerate regular diet.  Eating 60 to 100% of meals.  Has not been up on his feet, OOB since admission.  Bed alarm on though order is "up ad lib".      OBJECTIVE:         Vital signs in last 24 hours:    Temp:  [98.2 F (36.8 C)-98.6 F (37 C)] 98.2 F (36.8 C) (03/11 0513) Pulse Rate:  [80-90] 80 (03/11 0513) Resp:  [15-16] 15 (03/11 0513) BP: (137-173)/(71-91) 147/79 (03/11 0600) SpO2:  [97 %-98 %] 98 % (03/11 0513) Weight:  [178 lb 12.8 oz (81.1 kg)] 178 lb 12.8 oz (81.1 kg) (03/11 0513) Last BM Date: 01/01/18 Filed Weights   01/03/18 0351 01/04/18 0453 01/05/18 0513  Weight: 172 lb 2.9 oz (78.1 kg) 179 lb 14.3 oz (81.6 kg) 178 lb 12.8 oz (81.1 kg)   General: not ill looking, just looks tired   Heart: RRR.  No MRG Chest: clear bil.  Some cough, no dyspnea.    Abdomen: soft, NT.  Normal, active BS.    Extremities: slight pedal edema bil.   Neuro/Psych:  Slight UE tremor in hands but no asterixis.    Intake/Output from previous day: 03/10 0701 - 03/11 0700 In: 2828.3 [P.O.:820; I.V.:2008.3] Out: 3795 [Urine:3795]  Intake/Output this shift: No intake/output data recorded.  Lab Results: Recent Labs    01/03/18 0416  WBC 4.2  HGB 10.6*  HCT 28.7*  PLT 166   BMET Recent Labs    01/03/18 0416 01/03/18 1351 01/04/18 0543 01/05/18 0424  NA 134*  --  133* 136  K 2.8* 3.2* 2.9* 3.6  CL 100*  --  101 103  CO2 24  --  24 24  GLUCOSE 202*  --  146* 121*  BUN <5*  --  <5* <5*  CREATININE 0.59*  --  0.57* 0.46*  CALCIUM 7.8*  --  8.1* 8.1*   LFT Recent Labs    01/03/18 0416 01/04/18 0543 01/05/18 0424  PROT 5.2* 5.0* 5.2*  ALBUMIN 2.4* 2.2* 2.2*  AST 241* 217* 186*  ALT 97* 98* 93*  ALKPHOS 120 117 110  BILITOT 3.8* 2.4* 2.0*   PT/INR No results for  input(s): LABPROT, INR in the last 72 hours. Hepatitis Panel Recent Labs    01/03/18 0936  HEPBSAG Negative  HCVAB <0.1  HEPAIGM Negative  HEPBIGM Negative    Scheduled Meds: . feeding supplement (ENSURE ENLIVE)  237 mL Oral BID BM  . folic acid  1 mg Oral Daily  . lactulose  20 g Oral TID  . metoprolol tartrate  25 mg Oral BID  . multivitamin with minerals  1 tablet Oral Daily  . pantoprazole  40 mg Oral Q0600  . thiamine  100 mg Oral Daily   Or  . thiamine  100 mg Intravenous Daily   Continuous Infusions: . sodium chloride 100 mL/hr at 01/05/18 0558   PRN Meds:.acetaminophen **OR** acetaminophen, LORazepam **OR** LORazepam, ondansetron **OR** ondansetron (ZOFRAN) IV   ASSESMENT:   *   Acute alcoholic pancreatitis.  Mild, improved despite rising Lipase. GB sludge only, no ductal dilatation on CT and ultrasound imaging.  Tolerating solid, regular diet since Saturday 3/9  *  Alcoholic fatty liver.  Elevated LFTs (declining pattern) c/w alc hepatitis.  Discriminant  fz score 20 (no role for Prednisolone).   Acute hepatitis serologies negative.   INR 1.2. Slight increase ammonia.    *   Normocytic anemia.  Stable.    *  Non-critical thrombocytopenia.    *   "High Ammonia".  At 54 it is at most moderately abnormal.  No signs of HE.  Hospitalist added oral Lactulose.    *  Hx paroxysmal A fib.  Not on AC.  Based on [place] vasc score of 1, hx non-compliance and risk for GIB, cardiology treated with ASA only.     PLAN   *   Continue supportive care and, as outpt, absolute ETOH abstinence.    *  Has GI ROV with [place] PA-C on 3/25 at 10 AM.    *  Not sure if anything needs to be done imaging wise for the asymptomatic rise in Lipase.  Dr Marina Goodell will assume GI care today.    *  Drop Lactulose to BID, this can replace the Metamucil he takes nearly daily at home.  But he is not showing signs of HE.    *  PT eval/treat ordered by the [facility], I ordered q shift ambulate  with assist though if safe to walk independently could modify orders.    Jennye Moccasin  01/05/2018, 9:35 AM Pager: [phone]   GI ATTENDING  Interval history data reviewed. Patient seen and examined. Agree with your progress note above. Patient stable. Tolerating diet. Moving bowels. Laboratory stable. No significant pain. Change in lipase not clinically significant. The treatment plan is avoid all alcohol and follow up in the GI clinic in 2 weeks as arranged above. Should be able to go home tomorrow if he continues to tolerate diet and is ambulating without significant difficulty. Please call if there are questions or problems. Will sign off.  Wilhemina Bonito. Eda Keys., M.D. [facility] Division of Gastroenterology

## 2018-01-05 NOTE — Progress Notes (Signed)
PROGRESS NOTE  Alvin Benton ZOX:[id number] DOB: [birth year] DOA: 01/01/2018 PCP: Marilynn RailSeltzer, Stephen, MD  HPI/Recap of past 24 hours:  Alvin Benton is a 67 y.o. year old male with medical history significant for alcohol abuse, hypertension, paroxysmal atrial fibrillation who presented on 01/01/18 with acute onset abdominal pain, nausea and vomiting was found to have acute pancreatitis and alcoholic hepatitis.   Interval History No acute complaints.  No events overnight.  Continues to deny any abdominal pain, nausea or vomiting.  Tolerating oral diet.  States that he is very weak and would like to work with physical therapy.  Assessment/Plan: Principal Problem:   Acute alcoholic pancreatitis Active Problems:   Alcohol abuse   Atrial fibrillation (HCC)   Essential hypertension   Hypokalemia   Acute alcoholic hepatitis  #Acute pancreatitis, suspect alcohol induced, improving Lipase elevated to 629, however clinically patient continues to improve with toleration of diet and resolution of abdominal pain. -GI following and agrees, has outpatient follow-up arranged  #Elevated transaminases and hyperbilirubinemia, likely secondary to alcoholic hepatitis, improving  suspect alcoholic hepatitis (AST twice the value ALT) Abdominal ultrasound: Distended gallbladder with hepatic steatosis and no signs of cholelithiasis Hepatitis panel negative - Daily CMP   #Hyperammonemia concern for hepatic encephalopathy Ultrasound shows no evidence of cirrhosis, but with alcohol hepatitis suspect this can lead to some elevation in ammonia (Ammonia 54 on admission, asterixis on 2/10)  Asterixis no longer present on exam, no further symptoms of hepatic encephalopathy -We will continue lactulose at twice daily dosing -Appreciate GI recommendations, no current need for steroids - #Ethanol dependence, stable,  He will dynamically stable, no overt Estelle JuneZaidi States last drink several weeks ago Ethanol less than  10 -Monitor on CIWA protocol with telemetry, as needed Ativan per CIWA protocol -Continue supplementation with folic acid and thiamine -Stressed importance of alcohol cessation  #Hypokalemia and hypomagnesemia, resolved In setting of increased nausea/vomiting from admitting acute pancreatitis and alcohol dependence, resolved with IV and oral repletion of potassium and magnesium -Monitor as needed  #Anemia, suspect chronic disease related to alcohol dependence (some liver pathology) disease, stable -Continue to monitor on CBC  #Hypertension, not at goal -Resume home HCTZ, holding ARB -Continue home metoprolol 25 twice daily    Code Status: Full code  Family Communication: No family at bedside  Disposition Plan: Tolerating diet, improvement in mental status, expect discharge in 24 hours, awaiting PT evaluation.   Consultants: Gastroenterology  Procedures:  None  Antimicrobials:  None  Cultures:  None  Telemetry: Yes  DVT prophylaxis: SCDs given initiallylow platelets on admission   Objective: Vitals:   01/04/18 2256 01/05/18 0000 01/05/18 0513 01/05/18 0600  BP: (!) 163/85 (!) 165/86 (!) 173/91 (!) 147/79  Pulse: 87  80   Resp:   15   Temp:   98.2 F (36.8 C)   TempSrc:   Oral   SpO2:   98%   Weight:   81.1 kg (178 lb 12.8 oz)   Height:        Intake/Output Summary (Last 24 hours) at 01/05/2018 1418 Last data filed at 01/05/2018 0900 Gross per 24 hour  Intake 2848.33 ml  Output 2975 ml  Net -126.67 ml   Filed Weights   01/03/18 0351 01/04/18 0453 01/05/18 0513  Weight: 78.1 kg (172 lb 2.9 oz) 81.6 kg (179 lb 14.3 oz) 81.1 kg (178 lb 12.8 oz)    Exam:  General: Gentleman who looks stated age, comfortably lying in bed, Eyes: Scleral icterus, improved ENT: Moist  oral mucosa Cardiovascular: regular rate and rhythm, no murmurs, rubs or gallops, no edema Respiratory: Normal respiratory effort on room air, lungs clear to auscultation  bilaterally Abdomen: soft, non-distended, non-tender, normal bowel sounds Skin: No Rash Neurologic: Mental status AAOx3, no asterixis present Psychiatric: Aappropriate affect, and mood  Data Reviewed: CBC: Recent Labs  Lab 01/01/18 2025 01/02/18 0258 01/03/18 0416 01/05/18 0906  WBC 8.5 5.7 4.2 6.3  HGB 12.6* 10.4* 10.6* 10.8*  HCT 35.1* 29.1* 28.7* 32.2*  MCV 97.5 98.6 98.6 101.3*  PLT 159 129* 166 276   Basic Metabolic Panel: Recent Labs  Lab 01/01/18 2346 01/02/18 0258 01/02/18 1327 01/03/18 0416 01/03/18 0723 01/03/18 1351 01/04/18 0543 01/05/18 0424  NA  --  133* 133* 134*  --   --  133* 136  K  --  2.8* 2.3* 2.8*  --  3.2* 2.9* 3.6  CL  --  101 100* 100*  --   --  101 103  CO2  --  17* 22 24  --   --  24 24  GLUCOSE  --  72 123* 202*  --   --  146* 121*  BUN  --  8 6 <5*  --   --  <5* <5*  CREATININE  --  0.88 0.73 0.59*  --   --  0.57* 0.46*  CALCIUM  --  7.2* 7.4* 7.8*  --   --  8.1* 8.1*  MG 1.8  --   --   --  1.5*  --  1.5*  --    GFR: Estimated Creatinine Clearance: 93.3 mL/min (A) (by C-G formula based on SCr of 0.46 mg/dL (L)). Liver Function Tests: Recent Labs  Lab 01/01/18 2025 01/02/18 0258 01/03/18 0416 01/04/18 0543 01/05/18 0424  AST 368* 284* 241* 217* 186*  ALT 127* 101* 97* 98* 93*  ALKPHOS 146* 109 120 117 110  BILITOT 7.7* 4.8* 3.8* 2.4* 2.0*  PROT 6.5 5.2* 5.2* 5.0* 5.2*  ALBUMIN 3.2* 2.6* 2.4* 2.2* 2.2*   Recent Labs  Lab 01/01/18 2025 01/03/18 1351 01/04/18 0543  LIPASE 279* 356* 629*   Recent Labs  Lab 01/03/18 1729  AMMONIA 54*   Coagulation Profile: Recent Labs  Lab 01/02/18 0258  INR 1.21   Cardiac Enzymes: No results for input(s): CKTOTAL, CKMB, CKMBINDEX, TROPONINI in the last 168 hours. BNP (last 3 results) No results for input(s): PROBNP in the last 8760 hours. HbA1C: No results for input(s): HGBA1C in the last 72 hours. CBG: No results for input(s): GLUCAP in the last 168 hours. Lipid Profile: No  results for input(s): CHOL, HDL, LDLCALC, TRIG, CHOLHDL, LDLDIRECT in the last 72 hours. Thyroid Function Tests: No results for input(s): TSH, T4TOTAL, FREET4, T3FREE, THYROIDAB in the last 72 hours. Anemia Panel: No results for input(s): VITAMINB12, FOLATE, FERRITIN, TIBC, IRON, RETICCTPCT in the last 72 hours. Urine analysis:    Component Value Date/Time   COLORURINE AMBER (A) 01/02/2018 0515   APPEARANCEUR CLEAR 01/02/2018 0515   LABSPEC 1.040 (H) 01/02/2018 0515   PHURINE 6.0 01/02/2018 0515   GLUCOSEU NEGATIVE 01/02/2018 0515   HGBUR SMALL (A) 01/02/2018 0515   BILIRUBINUR NEGATIVE 01/02/2018 0515   KETONESUR 20 (A) 01/02/2018 0515   PROTEINUR NEGATIVE 01/02/2018 0515   NITRITE NEGATIVE 01/02/2018 0515   LEUKOCYTESUR NEGATIVE 01/02/2018 0515   Sepsis Labs: @LABRCNTIP (procalcitonin:4,lacticidven:4)  )No results found for this or any previous visit (from the past 240 hour(s)).    Studies: No results found.  Scheduled Meds: . feeding  supplement (ENSURE ENLIVE)  237 mL Oral BID BM  . folic acid  1 mg Oral Daily  . lactulose  20 g Oral BID  . metoprolol tartrate  25 mg Oral BID  . multivitamin with minerals  1 tablet Oral Daily  . pantoprazole  40 mg Oral Q0600  . thiamine  100 mg Oral Daily   Or  . thiamine  100 mg Intravenous Daily    Continuous Infusions:    LOS: 4 days     Laverna Peace, MD [facility] Pager [phone]  If 7PM-7AM, please contact night-coverage [email or web address] Password [facility] 01/05/2018, 2:18 PM

## 2018-01-06 LAB — COMPREHENSIVE METABOLIC PANEL
ALBUMIN: 2.3 g/dL — AB (ref 3.5–5.0)
ALT: 82 U/L — ABNORMAL HIGH (ref 17–63)
AST: 162 U/L — AB (ref 15–41)
Alkaline Phosphatase: 107 U/L (ref 38–126)
Anion gap: 10 (ref 5–15)
CHLORIDE: 101 mmol/L (ref 101–111)
CO2: 23 mmol/L (ref 22–32)
Calcium: 8.4 mg/dL — ABNORMAL LOW (ref 8.9–10.3)
Creatinine, Ser: 0.66 mg/dL (ref 0.61–1.24)
GFR calc Af Amer: >60 mL/min (ref >60)
GFR calc non Af Amer: >60 mL/min (ref >60)
GLUCOSE: 135 mg/dL — AB (ref 65–99)
POTASSIUM: 3.3 mmol/L — AB (ref 3.5–5.1)
SODIUM: 134 mmol/L — AB (ref 135–145)
Total Bilirubin: 1.7 mg/dL — ABNORMAL HIGH (ref 0.3–1.2)
Total Protein: 5.4 g/dL — ABNORMAL LOW (ref 6.5–8.1)

## 2018-01-06 NOTE — Progress Notes (Signed)
PROGRESS NOTE  Roselee NovaScott Colleran ZOX:[id number] DOB: 1951/09/17 DOA: 01/01/2018 PCP: Marilynn RailSeltzer, Stephen, MD  HPI/Recap of past 24 hours:  Roselee NovaScott Common is a 67 y.o. year old male with medical history significant for alcohol abuse, hypertension, paroxysmal atrial fibrillation who presented on 01/01/18 with acute onset abdominal pain, nausea and vomiting was found to have acute pancreatitis and alcoholic hepatitis.   Interval History No acute complaints.  No events overnight.  No abdominal pain.  Tolerating diet.  No fevers or chills.  No tremors  Assessment/Plan: Principal Problem:   Acute alcoholic pancreatitis Active Problems:   Alcohol abuse   Atrial fibrillation (HCC)   Essential hypertension   Hypokalemia   Acute alcoholic hepatitis  #Acute pancreatitis, suspect alcohol induced, improving Lipase elevated to 629, however clinically patient continues to improve with toleration of diet and resolution of abdominal pain. -GI following (now signed off) and agrees, has outpatient follow-up arranged  #Elevated transaminases and hyperbilirubinemia secondary to alcoholic hepatitis, improving Abdominal ultrasound: Distended gallbladder with hepatic steatosis and no signs of cholelithiasis Hepatitis panel negative - Daily CMP   #Hyperammonemia, no symptoms consistent with hepatic encephalopathy Ultrasound shows no evidence of cirrhosis, but with alcohol hepatitis suspect this can lead to some elevation in ammonia (Ammonia 54 on admission, asterixis on 2/10, now resolved)  -We will continue lactulose at twice daily dosing -Appreciate GI recommendations, no current need for steroids  #Ethanol dependence, stable,  No overt signs of withdrawal, remains hemodynamically stable on admission States last drink several weeks ago Ethanol less than 10 -Monitor on CIWA protocol with telemetry, as needed Ativan per CIWA protocol -Continue supplementation with folic acid and thiamine -Stressed importance of  alcohol cessation  #Hypokalemia and hypomagnesemia, resolved In setting of increased nausea/vomiting from admitting acute pancreatitis and alcohol dependence, resolved with IV and oral repletion of potassium and magnesium -Monitor as needed  #Anemia, suspect chronic disease related to alcohol dependence (some liver pathology) disease, stable -Continue to monitor on CBC  #Hypertension, not at goal -Resume home HCTZ, holding ARB -Continue home metoprolol 25 twice daily    Code Status: Full code  Family Communication: No family at bedside  Disposition Plan: Awaiting SNF placement   Consultants: Gastroenterology  Procedures:  None  Antimicrobials:  None  Cultures:  None  Telemetry: Yes  DVT prophylaxis: SCDs given initiallylow platelets on admission   Objective: Vitals:   01/06/18 0241 01/06/18 0600 01/06/18 1047 01/06/18 1345  BP: (!) 146/80  (!) 167/94 (!) 123/99  Pulse: 69  90 (!) 105  Resp: 17   (!) 21  Temp: 98 F (36.7 C)   98.4 F (36.9 C)  TempSrc: Oral   Oral  SpO2: 97%   97%  Weight: 78.6 kg (173 lb 4.5 oz) 78.5 kg (173 lb 1 oz)    Height:        Intake/Output Summary (Last 24 hours) at 01/06/2018 2034 Last data filed at 01/06/2018 1943 Gross per 24 hour  Intake 480 ml  Output 4300 ml  Net -3820 ml   Filed Weights   01/05/18 0513 01/06/18 0241 01/06/18 0600  Weight: 81.1 kg (178 lb 12.8 oz) 78.6 kg (173 lb 4.5 oz) 78.5 kg (173 lb 1 oz)    Exam:  General: Gentleman who looks stated age, comfortably lying in bed, Eyes: Scleral icterus, improved ENT: Moist oral mucosa Cardiovascular: regular rate and rhythm, no murmurs, rubs or gallops, no edema Respiratory: Normal respiratory effort on room air, lungs clear to auscultation bilaterally Abdomen: soft,  non-distended, non-tender, normal bowel sounds Skin: No Rash Neurologic: Mental status AAOx3, no asterixis present Psychiatric: Aappropriate affect, and mood  Data Reviewed: CBC: Recent  Labs  Lab 01/01/18 2025 01/02/18 0258 01/03/18 0416 01/05/18 0906  WBC 8.5 5.7 4.2 6.3  HGB 12.6* 10.4* 10.6* 10.8*  HCT 35.1* 29.1* 28.7* 32.2*  MCV 97.5 98.6 98.6 101.3*  PLT 159 129* 166 276   Basic Metabolic Panel: Recent Labs  Lab 01/01/18 2346  01/02/18 1327 01/03/18 0416 01/03/18 0723 01/03/18 1351 01/04/18 0543 01/05/18 0424 01/06/18 0345  NA  --    < > 133* 134*  --   --  133* 136 134*  K  --    < > 2.3* 2.8*  --  3.2* 2.9* 3.6 3.3*  CL  --    < > 100* 100*  --   --  101 103 101  CO2  --    < > 22 24  --   --  24 24 23   GLUCOSE  --    < > 123* 202*  --   --  146* 121* 135*  BUN  --    < > 6 <5*  --   --  <5* <5* <5*  CREATININE  --    < > 0.73 0.59*  --   --  0.57* 0.46* 0.66  CALCIUM  --    < > 7.4* 7.8*  --   --  8.1* 8.1* 8.4*  MG 1.8  --   --   --  1.5*  --  1.5* 1.6*  --    < > = values in this interval not displayed.   GFR: Estimated Creatinine Clearance: 92 mL/min (by C-G formula based on SCr of 0.66 mg/dL). Liver Function Tests: Recent Labs  Lab 01/02/18 0258 01/03/18 0416 01/04/18 0543 01/05/18 0424 01/06/18 0345  AST 284* 241* 217* 186* 162*  ALT 101* 97* 98* 93* 82*  ALKPHOS 109 120 117 110 107  BILITOT 4.8* 3.8* 2.4* 2.0* 1.7*  PROT 5.2* 5.2* 5.0* 5.2* 5.4*  ALBUMIN 2.6* 2.4* 2.2* 2.2* 2.3*   Recent Labs  Lab 01/01/18 2025 01/03/18 1351 01/04/18 0543  LIPASE 279* 356* 629*   Recent Labs  Lab 01/03/18 1729  AMMONIA 54*   Coagulation Profile: Recent Labs  Lab 01/02/18 0258  INR 1.21   Cardiac Enzymes: No results for input(s): CKTOTAL, CKMB, CKMBINDEX, TROPONINI in the last 168 hours. BNP (last 3 results) No results for input(s): PROBNP in the last 8760 hours. HbA1C: No results for input(s): HGBA1C in the last 72 hours. CBG: No results for input(s): GLUCAP in the last 168 hours. Lipid Profile: No results for input(s): CHOL, HDL, LDLCALC, TRIG, CHOLHDL, LDLDIRECT in the last 72 hours. Thyroid Function Tests: No results  for input(s): TSH, T4TOTAL, FREET4, T3FREE, THYROIDAB in the last 72 hours. Anemia Panel: No results for input(s): VITAMINB12, FOLATE, FERRITIN, TIBC, IRON, RETICCTPCT in the last 72 hours. Urine analysis:    Component Value Date/Time   COLORURINE AMBER (A) 01/02/2018 0515   APPEARANCEUR CLEAR 01/02/2018 0515   LABSPEC 1.040 (H) 01/02/2018 0515   PHURINE 6.0 01/02/2018 0515   GLUCOSEU NEGATIVE 01/02/2018 0515   HGBUR SMALL (A) 01/02/2018 0515   BILIRUBINUR NEGATIVE 01/02/2018 0515   KETONESUR 20 (A) 01/02/2018 0515   PROTEINUR NEGATIVE 01/02/2018 0515   NITRITE NEGATIVE 01/02/2018 0515   LEUKOCYTESUR NEGATIVE 01/02/2018 0515   Sepsis Labs: @LABRCNTIP (procalcitonin:4,lacticidven:4)  )No results found for this or any previous visit (from the  past 240 hour(s)).    Studies: No results found.  Scheduled Meds: . feeding supplement (ENSURE ENLIVE)  237 mL Oral BID BM  . folic acid  1 mg Oral Daily  . hydrochlorothiazide  25 mg Oral Daily  . lactulose  20 g Oral BID  . metoprolol tartrate  25 mg Oral BID  . multivitamin with minerals  1 tablet Oral Daily  . pantoprazole  40 mg Oral Q0600  . thiamine  100 mg Oral Daily   Or  . thiamine  100 mg Intravenous Daily    Continuous Infusions:    LOS: 5 days     Laverna Peace, MD [facility] Pager [phone]  If 7PM-7AM, please contact night-coverage [email or web address] Password [facility] 01/06/2018, 8:34 PM

## 2018-01-06 NOTE — Clinical Social Work Note (Signed)
Clinical Social Work Assessment  Patient Details  Name: Alvin Benton MRN: [id number] Date of Birth: [birth year]  Date of referral:  01/06/18               Reason for consult:  Facility Placement                Permission sought to share information with:  Facility Art therapist granted to share information::     Name::        Agency::  SNFs  Relationship::     Contact Information:     Housing/Transportation Living arrangements for the past 2 months:  Apartment Source of Information:  Patient Patient Interpreter Needed:  None Criminal Activity/Legal Involvement Pertinent to Current Situation/Hospitalization:  No - Comment as needed Significant Relationships:  Friend Lives with:  Self Do you feel safe going back to the place where you live?  No(stairs to apartment) Need for family participation in patient care:  No (Coment)  Care giving concerns: Patient from home. Initial consult for substance use. Now PT evaluated and recommended SNF. Patient does not have insurance coverage.   Social Worker assessment / plan: CSW met with patient at bedside. Patient lives independently in an apartment. Patient indicated he does not feel he can make it up the stairs to his apartment if he goes directly home from the hospital. CSW discussed barrier of lack of insurance and coverage for SNF. Patient has assets in the form of 401K that he has been living off of since losing his job last year. Patient is not yet eligible for Medicare, though will be after his birthday in April. Patient likely not eligible for Medicaid due to assets.  Patient is agreeable to SNF. CSW advised that SNF will cost several thousand dollars out of pocket; patient agreeable to look into SNF and is hopeful a facility will make a payment plan arrangement with him. CSW sent out initial SNF referrals. CSW to follow up with facilities to determine payment options. CSW to support with disposition  planning.  Employment status:  Unemployed Forensic scientist:  Self Pay (Medicaid Pending) PT Recommendations:  [place] / Referral to community resources:  [place]  Patient/Family's Response to care: Patient appreciative of care.  Patient/Family's Understanding of and Emotional Response to Diagnosis, Current Treatment, and Prognosis: Patient with understanding of his condition and hopeful for payment arrangement with SNF.  Emotional Assessment Appearance:  Appears stated age Attitude/Demeanor/Rapport:  Engaged, Lethargic Affect (typically observed):  Calm Orientation:  Oriented to Self, Oriented to Place, Oriented to  Time, Oriented to Situation Alcohol / Substance use:  Not Applicable Psych involvement (Current and /or in the community):  No (Comment)  Discharge Needs  Concerns to be addressed:  Discharge Planning Concerns, Care Coordination Readmission within the last 30 days:  No Current discharge risk:  Substance Abuse, Physical Impairment Barriers to Discharge:  No SNF bed, Continued Medical Work up   Estanislado Emms, LCSW 01/06/2018, 12:32 PM

## 2018-01-06 NOTE — Progress Notes (Signed)
Patient is calm tonight and has been following commands.  I will keep monitoring patient.

## 2018-01-06 NOTE — Evaluation (Signed)
Physical Therapy Evaluation Patient Details Name: Alvin Benton MRN: [id number] DOB: Mar 08, 1951 Today's Date: 01/06/2018   History of Present Illness   67 y.o. year old male with medical history significant for alcohol abuse, hypertension, paroxysmal atrial fibrillation who presented on 01/01/18 with acute onset abdominal pain, nausea and vomiting was found to have acute pancreatitis and alcoholic hepatitis.  Clinical Impression  Pt admitted with above diagnosis. Pt currently with functional limitations due to the deficits listed below (see PT Problem List). Bed to recliner transfer with min A for balance. Pt has shuffling gait with wide base of support. Dizziness limited standing tolerance, he also reported dizziness in sitting (pt stated he's been in bed for 2 weeks). BP 167/94 in sitting, unable to stand long enough to get BP reading.  Pt will benefit from skilled PT to increase their independence and safety with mobility to allow discharge to the venue listed below.       Follow Up Recommendations SNF;Supervision for mobility/OOB    Equipment Recommendations  Rolling walker with 5" wheels    Recommendations for Other Services       Precautions / Restrictions Precautions Precautions: Fall Precaution Comments: pt denies h/o falls in past 1 year, unsteady gait today Restrictions Weight Bearing Restrictions: No      Mobility  Bed Mobility Overal bed mobility: Modified Independent             General bed mobility comments: with rail; pt dizzy in sitting and standing, BP 167/94 in sitting, pt couldn't stand long enough to get BP reading  Transfers Overall transfer level: Needs assistance Equipment used: Rolling walker (2 wheeled) Transfers: Sit to/from Stand Sit to Stand: Min assist         General transfer comment: assist to rise, VCs hand placement  Ambulation/Gait Ambulation/Gait assistance: Min guard Ambulation Distance (Feet): 3 Feet Assistive device: Rolling  walker (2 wheeled) Gait Pattern/deviations: Decreased step length - right;Decreased step length - left;Wide base of support;Shuffle   Gait velocity interpretation: Below normal speed for age/gender General Gait Details: shuffling gait, wide BOS, distance limited by dizziness in standing  Stairs            Wheelchair Mobility    Modified Rankin (Stroke Patients Only)       Balance Overall balance assessment: Needs assistance   Sitting balance-Leahy Scale: Good       Standing balance-Leahy Scale: Fair                               Pertinent Vitals/Pain Pain Assessment: 0-10 Pain Score: 5  Pain Location: abdomen Pain Descriptors / Indicators: Aching Pain Intervention(s): Limited activity within patient's tolerance;Monitored during session    Home Living Family/patient expects to be discharged to:: Private residence Living Arrangements: Alone   Type of Home: Apartment Home Access: Stairs to enter Entrance Stairs-Rails: Right Entrance Stairs-Number of Steps: 12 Home Layout: One level Home Equipment: None Additional Comments: pt stated he had a cane but couldn't find it PTA    Prior Function Level of Independence: Independent with assistive device(s);Independent         Comments: uses a cane when he feels unsteady     Hand Dominance   Dominant Hand: Right    Extremity/Trunk Assessment   Upper Extremity Assessment Upper Extremity Assessment: Overall WFL for tasks assessed    Lower Extremity Assessment Lower Extremity Assessment: Generalized weakness(4/5 B knee ext; decreased sensation to light touch  B feet (pt unsure if this is new or baseline))    Cervical / Trunk Assessment Cervical / Trunk Assessment: Normal  Communication   Communication: No difficulties  Cognition Arousal/Alertness: Awake/alert Behavior During Therapy: WFL for tasks assessed/performed Overall Cognitive Status: Within Functional Limits for tasks assessed                                         General Comments      Exercises     Assessment/Plan    PT Assessment Patient needs continued PT services  PT Problem List Decreased strength;Decreased balance;Decreased mobility;Decreased activity tolerance;Pain       PT Treatment Interventions Gait training;Functional mobility training;Therapeutic activities;Therapeutic exercise;Balance training;Patient/family education    PT Goals (Current goals can be found in the Care Plan section)  Acute Rehab PT Goals Patient Stated Goal: pt agreeable to ST-SNF PT Goal Formulation: With patient Time For Goal Achievement: 01/20/18 Potential to Achieve Goals: Fair    Frequency Min 3X/week   Barriers to discharge Decreased caregiver support pt reports no assistance available at home    Co-evaluation               AM-PAC PT "6 Clicks" Daily Activity  Outcome Measure Difficulty turning over in bed (including adjusting bedclothes, sheets and blankets)?: A Little Difficulty moving from lying on back to sitting on the side of the bed? : A Little Difficulty sitting down on and standing up from a chair with arms (e.g., wheelchair, bedside commode, etc,.)?: A Little Help needed moving to and from a bed to chair (including a wheelchair)?: A Little Help needed walking in hospital room?: A Lot Help needed climbing 3-5 steps with a railing? : A Lot 6 Click Score: 16    End of Session Equipment Utilized During Treatment: Gait belt Activity Tolerance: Treatment limited secondary to medical complications (Comment)(dizziness in sitting and standing) Patient left: in chair;with call bell/phone within reach;with chair alarm set Nurse Communication: Mobility status(dizziness) PT Visit Diagnosis: Unsteadiness on feet (R26.81);Difficulty in walking, not elsewhere classified (R26.2);Dizziness and giddiness (R42);Muscle weakness (generalized) (M62.81)    Time: 1041-1100 PT Time Calculation (min)  (ACUTE ONLY): 19 min   Charges:   PT Evaluation $PT Eval Low Complexity: 1 Low     PT G Codes:          Tamala Ser 01/06/2018, 11:11 AM [phone]

## 2018-01-06 NOTE — Progress Notes (Signed)
Patient has not received any SNF bed offers. CSW has followed up with several facilities ([facility], [facility], [facility], [facility]; others have declined referral in [employer] hub.) Patient will private pay for SNF, see CSW assessment for details. [facility] is considering private pay arrangement with patient to pay only two weeks up front; most facilities require a full month up front. CSW awaiting official offer from [facility]. CSW to follow and support with disposition.  Abigail ButtsSusan Euleta Belson, LCSWA [phone]

## 2018-01-06 NOTE — NC FL2 (Signed)
[place] MEDICAID FL2 LEVEL OF CARE SCREENING TOOL     IDENTIFICATION  Patient Name: Alvin Benton Birthdate: [birth year] Sex: male Admission Date (Current Location): 01/01/2018  [facility] and [place] Number:  Producer, television/film/video and Address:  [facility] [place], [address] [place], [place] [address]      Provider Number: [id number]  Attending Physician Name and Address:  Laverna Peace, MD  Relative Name and Phone Number:  Betsey Holiday, friend, [phone]    Current Level of Care: Hospital Recommended Level of Care: Skilled Nursing Facility Prior Approval Number:    Date Approved/Denied:   PASRR Number: [id number]  Discharge Plan: SNF    Current Diagnoses: Patient Active Problem List   Diagnosis Date Noted  . Acute alcoholic pancreatitis 01/01/2018  . Acute alcoholic hepatitis 01/01/2018  . Elevated troponin   . Nausea & vomiting 07/21/2017  . Atrial fibrillation with RVR (HCC) 07/21/2017  . Alcoholic ketoacidosis 07/21/2017  . PNA (pneumonia): pROBABLE ASPIRATION 05/21/2017  . Hyponatremia 05/20/2017  . Dehydration 05/20/2017  . Syncope 02/26/2017  . History of atrial fibrillation 02/26/2017  . Right flank hematoma 02/26/2017  . Zygomatic fracture, right side, initial encounter for closed fracture (HCC) 02/26/2017  . Multiple rib fractures 02/26/2017  . Acute pulmonary edema (HCC)   . Dizziness   . Fall   . Dehydration with hyponatremia 02/20/2017  . Alcohol dependence in early full remission (HCC) 06/04/2016  . Volume depletion 06/04/2016  . Hypokalemia 06/04/2016  . Diverticulitis of intestine with perforation without bleeding 06/04/2016  . Essential hypertension   . Hypomagnesemia   . Ileus (HCC) 05/05/2016  . Systolic murmur 05/05/2016  . Anxiety state 04/19/2016  . 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 Hypoxia 04/05/2016  . Atrial fibrillation (HCC) 04/05/2016  . Diverticulitis of large intestine with perforation and abscess  03/14/2016  . Chronic constipation 03/08/2016  . Alcohol abuse 02/15/2016  . Tobacco use disorder 07/17/2015  . Allergic rhinitis 07/17/2015  . Reactive airway disease with wheezing 07/17/2015  . Overweight (BMI 25.0-29.9) 07/17/2015  . Hand pain, left 01/12/2014  . LLQ pain-chronic, intermittent and episodic 09/17/2013    Orientation RESPIRATION BLADDER Height & Weight     Self, Time, Situation, Place  Normal Continent, External catheter Weight: 173 lb 1 oz (78.5 kg) Height:  5\' 6"  (167.6 cm)  BEHAVIORAL SYMPTOMS/MOOD NEUROLOGICAL BOWEL NUTRITION STATUS      Continent Diet(please see DC summary)  AMBULATORY STATUS COMMUNICATION OF NEEDS Skin   Limited Assist Verbally Normal                       Personal Care Assistance Level of Assistance  Bathing, Feeding, Dressing Bathing Assistance: Limited assistance Feeding assistance: Independent Dressing Assistance: Limited assistance     Functional Limitations Info  Sight, Hearing, Speech Sight Info: Adequate Hearing Info: Adequate Speech Info: Adequate    SPECIAL CARE FACTORS FREQUENCY  PT (By licensed PT)     PT Frequency: 5x/week              Contractures Contractures Info: Not present    Additional Factors Info  Code Status, Allergies Code Status Info: Full Allergies Info: No Known Allergies           Current Medications (01/06/2018):  This is the current hospital active medication list Current Facility-Administered Medications  Medication Dose Route Frequency Provider Last Rate Last Dose  . acetaminophen (TYLENOL) tablet 650 mg  650 mg Oral Q6H PRN Julian Reil,  Jared M, DO   650 mg at 01/06/18 0110   Or  . acetaminophen (TYLENOL) suppository 650 mg  650 mg Rectal Q6H PRN Hillary BowGardner, Jared M, DO      . alum & mag hydroxide-simeth (MAALOX/MYLANTA) 200-200-20 MG/5ML suspension 15 mL  15 mL Oral Q4Heywood IlesH PRN Roberto ScalesNettey, Shayla D, MD   15 mL at 01/05/18 1848  . feeding supplement (ENSURE ENLIVE) (ENSURE ENLIVE) liquid 237  mL  237 mL Oral BID BM Roberto ScalesNettey, Shayla D, MD   237 mL at 01/04/18 1603  . folic acid (FOLVITE) tablet 1 mg  1 mg Oral Daily Lyda PeroneGardner, Jared M, DO   1 mg at 01/06/18 1030  . hydrochlorothiazide (HYDRODIURIL) tablet 25 mg  25 mg Oral Daily Roberto ScalesNettey, Shayla D, MD   25 mg at 01/06/18 1030  . lactulose (CHRONULAC) 10 GM/15ML solution 20 g  20 g Oral BID Dianah FieldGribbin, Sarah J, PA-C   20 g at 01/06/18 1030  . metoprolol tartrate (LOPRESSOR) tablet 25 mg  25 mg Oral BID Roberto ScalesNettey, Shayla D, MD   25 mg at 01/06/18 1030  . multivitamin with minerals tablet 1 tablet  1 tablet Oral Daily Lyda PeroneGardner, Jared M, DO   1 tablet at 01/06/18 1030  . ondansetron (ZOFRAN) tablet 4 mg  4 mg Oral Q6H PRN Hillary BowGardner, Jared M, DO       Or  . ondansetron [facility](ZOFRAN) injection 4 mg  4 mg Intravenous Q6H PRN Hillary BowGardner, Jared M, DO   4 mg at 01/06/18 0110  . pantoprazole (PROTONIX) EC tablet 40 mg  40 mg Oral Q0600 Dianah FieldGribbin, Sarah J, PA-C   40 mg at 01/06/18 [id number]  . thiamine (VITAMIN B-1) tablet 100 mg  100 mg Oral Daily Julian ReilGardner, Jared M, DO   100 mg at 01/06/18 1030   Or  . thiamine (B-1) injection 100 mg  100 mg Intravenous Daily Lyda PeroneGardner, Jared M, DO       Facility-Administered Medications Ordered in Other Encounters  Medication Dose Route Frequency Provider Last Rate Last Dose  . lactated ringers infusion    Continuous PRN Epimenio SarinJarvela, Joshua R, CRNA   Stopped at 08/29/16 [id number]     Discharge Medications: Please see discharge summary for a list of discharge medications.  Relevant Imaging Results:  Relevant Lab Results:   Additional Information SSN: [id number]  Abigail ButtsSusan Tylor Gambrill, LCSW

## 2018-01-07 MED ORDER — ENSURE ENLIVE PO LIQD: 237.0000 mL | Freq: Two times a day (BID) | ORAL | 0 refills | Status: DC

## 2018-01-07 MED ORDER — PANTOPRAZOLE SODIUM 40 MG PO TBEC: 40.0000 mg | DELAYED_RELEASE_TABLET | Freq: Every day | ORAL | 0 refills | Status: DC

## 2018-01-07 MED ORDER — LACTULOSE 10 GM/15ML PO SOLN: 20.0000 g | Freq: Two times a day (BID) | ORAL | 0 refills | Status: DC

## 2018-01-07 MED ORDER — FOLIC ACID 1 MG PO TABS: 1.0000 mg | ORAL_TABLET | Freq: Every day | ORAL | 0 refills | Status: DC

## 2018-01-07 MED ORDER — TRAMADOL HCL 50 MG PO TABS
50.0000 mg | ORAL_TABLET | Freq: Once | ORAL | Status: AC | PRN
  Administered 2018-01-07: 50 mg via ORAL
  Filled 2018-01-07: qty 1

## 2018-01-07 NOTE — Progress Notes (Signed)
Physical Therapy Treatment Patient Details Name: Alvin NovaScott Benton MRN: [id number] DOB: 06/15/1951 Today's Date: 01/07/2018    History of Present Illness  67 y.o. year old male with medical history significant for alcohol abuse, hypertension, paroxysmal atrial fibrillation who presented on 01/01/18 with acute onset abdominal pain, nausea and vomiting was found to have acute pancreatitis and alcoholic hepatitis.    PT Comments    Pt seen today to help him make a decision on home vs SNF.  Pt able to mobilize OOB and walk to stairwell and also ascend/decend the steps.  However, he was not confident on the stairs and couldn't confirm a way to get home, he could not come up with family or friend that he could call on for help to get groceries and other necessities.    Follow Up Recommendations  SNF;Supervision for mobility/OOB     Equipment Recommendations  Rolling walker with 5" wheels    Recommendations for Other Services       Precautions / Restrictions Precautions Precautions: Fall    Mobility  Bed Mobility Overal bed mobility: Modified Independent             General bed mobility comments: no rail and no assist  Transfers Overall transfer level: Needs assistance Equipment used: Rolling walker (2 wheeled) Transfers: Sit to/from Stand Sit to Stand: From elevated surface;Min guard         General transfer comment: cues for hand placement, no assist  Ambulation/Gait Ambulation/Gait assistance: Min guard Ambulation Distance (Feet): 150 Feet(x2) Assistive device: Rolling walker (2 wheeled) Gait Pattern/deviations: Step-through pattern;Decreased step length - right;Wide base of support   Gait velocity interpretation: Below normal speed for age/gender General Gait Details: stiff, short step with wide BOS.  Fair use of the RW, but tended to walk to the side of the RW in turns   Stairs Stairs: Yes   Stair Management: One rail Right;Step to  pattern;Forwards;Backwards Number of Stairs: 5    Wheelchair Mobility    Modified Rankin (Stroke Patients Only)       Balance Overall balance assessment: Needs assistance   Sitting balance-Leahy Scale: Good       Standing balance-Leahy Scale: Fair Standing balance comment: statically fair, but needs RW for gait presently                            Cognition Arousal/Alertness: Awake/alert Behavior During Therapy: WFL for tasks assessed/performed Overall Cognitive Status: Within Functional Limits for tasks assessed                                        Exercises      General Comments        Pertinent Vitals/Pain Pain Assessment: No/denies pain    Home Living                      Prior Function            PT Goals (current goals can now be found in the care plan section) Acute Rehab PT Goals Patient Stated Goal: pt agreeable to ST-SNF PT Goal Formulation: With patient Time For Goal Achievement: 01/20/18 Potential to Achieve Goals: Fair    Frequency    Min 3X/week      PT Plan Current plan remains appropriate    Co-evaluation  AM-PAC PT "6 Clicks" Daily Activity  Outcome Measure  Difficulty turning over in bed (including adjusting bedclothes, sheets and blankets)?: None Difficulty moving from lying on back to sitting on the side of the bed? : None Difficulty sitting down on and standing up from a chair with arms (e.g., wheelchair, bedside commode, etc,.)?: A Little Help needed moving to and from a bed to chair (including a wheelchair)?: A Little Help needed walking in hospital room?: A Little Help needed climbing 3-5 steps with a railing? : None 6 Click Score: 21    End of Session   Activity Tolerance: Patient tolerated treatment well Patient left: in bed;with call bell/phone within reach Nurse Communication: Mobility status PT Visit Diagnosis: Unsteadiness on feet (R26.81);Other  abnormalities of gait and mobility (R26.89)     Time: 1610-9604 PT Time Calculation (min) (ACUTE ONLY): 29 min  Charges:  $Gait Training: 8-22 mins $Therapeutic Activity: 8-22 mins                    G Codes:       Feb 01, 2018  [place] Bing, PT [phone]  (pager)   Alvin Benton 02-01-2018, 5:12 PM

## 2018-01-07 NOTE — Progress Notes (Signed)
CSW met with patient at bedside and discussed disposition plan. Patient feels he may be able to go home, states he is feeling better and wants to get up and walk with PT. He is also concerned about the private pay cost of SNF, so he is hopeful he can walk better now and discharge home.   CSW was able to find two SNF beds that will allow patient to pay for just one week room and board up front, whereas most facilities require a full month up front. CSW gave these offers to patient - [facility] and [facility] - and advised on the cost for a week up front. CSW advised patient he would be billed for therapies, medications, and supplies afterwards. Patient to review the offers.  CSW to follow for disposition planning pending patient's progress.  Alvin Benton, [place]

## 2018-01-07 NOTE — Clinical Social Work Placement (Signed)
   CLINICAL SOCIAL WORK PLACEMENT  NOTE  Date:  01/07/2018  Patient Details  Name: Alvin Benton MRN: [id number] Date of Birth: 28-Sep-1951  Clinical Social Work is seeking post-discharge placement for this patient at the Skilled  Nursing Facility level of care (*CSW will initial, date and re-position this form in  chart as items are completed):  Yes   Patient/family provided with [facility] Clinical Social Work Department's list of facilities offering this level of care within the geographic area requested by the patient (or if unable, by the patient's family).  Yes   Patient/family informed of their freedom to choose among providers that offer the needed level of care, that participate in Medicare, Medicaid or managed care program needed by the patient, have an available bed and are willing to accept the patient.  Yes   Patient/family informed of [facility]'s ownership interest in [facility] and [facility], as well as of the fact that they are under no obligation to receive care at these facilities.  PASRR submitted to EDS on 01/06/18     PASRR number received on       Existing PASRR number confirmed on 01/06/18     FL2 transmitted to all facilities in geographic area requested by pt/family on 01/06/18     FL2 transmitted to all facilities within larger geographic area on       Patient informed that his/her managed care company has contracts with or will negotiate with certain facilities, including the following:        Yes   Patient/family informed of bed offers received.  [facility]     Physician recommends and patient chooses bed at      [facility] on 01/07/18.  Patient to be transferred to facility by PTAR     Patient family notified on 01/07/18 of transfer.  Name of family member notified:  Patient declined.      PHYSICIAN Please prepare prescriptions     Additional Comment:    _______________________________________________ Margarito LinerSarah C Kierre Deines, LCSW 01/07/2018, 3:37 PM

## 2018-01-07 NOTE — Clinical Social Work Note (Signed)
Patient has decided to discharge to [facility]. He will need [facility]AR. Patient states he has no family for CSW to contact. CSW left voicemail for SNF admissions coordinator. Will facilitate transfer once she returns call.  Charlynn CourtSarah Sable Knoles, CSW [phone]

## 2018-01-07 NOTE — Clinical Social Work Note (Addendum)
CSW facilitated patient discharge including contacting patient family and facility to confirm patient discharge plans. Clinical information faxed to facility and family agreeable with plan. CSW arranged ambulance transport via PTAR to [facility] at 4:30 pm. RN to call report prior to discharge [phone] Room 106).  CSW will sign off for now as social work intervention is no longer needed. Please consult us again if new needs arise.  Charlynn CourtSarah Emalene Welte, CSW [phone]

## 2018-01-07 NOTE — Discharge Instructions (Signed)
Acute Pancreatitis Acute pancreatitis is a condition in which the pancreas suddenly gets irritated and swollen (has inflammation). The pancreas is a large gland behind the stomach. It makes enzymes that help to digest food. The pancreas also makes hormones that help to control your blood sugar. Acute pancreatitis happens when the enzymes attack the pancreas and damage it. Most attacks last a couple of days and can cause serious problems. Follow these instructions at home: Eating and drinking  Follow instructions from your doctor about diet. You may need to: ? Avoid alcohol. ? Limit how much fat is in your diet.  Eat small meals often. Avoid eating big meals.  Drink enough fluid to keep your pee (urine) clear or pale yellow.  Do not drink alcohol if it caused your condition. General instructions  Take over-the-counter and prescription medicines only as told by your doctor.  Do not use any tobacco products. These include cigarettes, chewing tobacco, and e-cigarettes. If you need help quitting, ask your doctor.  Get plenty of rest.  If directed, check your blood sugar at home as told by your doctor.  Keep all follow-up visits as told by your doctor. This is important. Contact a doctor if:  You do not get better as quickly as expected.  You have new symptoms.  Your symptoms get worse.  You have lasting pain or weakness.  You continue to feel sick to your stomach (nauseous).  You get better and then you have another pain attack.  You have a fever. Get help right away if:  You cannot eat or keep fluids down.  Your pain becomes very bad.  Your skin or the white part of your eyes turns yellow (jaundice).  You throw up (vomit).  You feel dizzy or you pass out (faint).  Your blood sugar is high (over 300 mg/dL). This information is not intended to replace advice given to you by your health care provider. Make sure you discuss any questions you have with your health care  provider. Document Released: 04/01/2008 Document Revised: 03/21/2016 Document Reviewed: 07/18/2015 Elsevier Interactive Patient Education  2018 [employer]  

## 2018-01-07 NOTE — Discharge Summary (Signed)
Physician Discharge Summary  Divit Stipp UJW:[id number] DOB: Oct 21, 1951 DOA: 01/01/2018  PCP: Marilynn Rail, MD  Admit date: 01/01/2018 Discharge date: 01/07/2018  Time spent: > 35 minutes  Recommendations for Outpatient Follow-up:  1. Continue to encourage alcohol cessation 2. Recheck Mg and K levels    Discharge Diagnoses:  Principal Problem:   Acute alcoholic pancreatitis Active Problems:   Alcohol abuse   Atrial fibrillation (HCC)   Essential hypertension   Hypokalemia   Acute alcoholic hepatitis   Discharge Condition: stable  Diet recommendation: Heart healthy  Filed Weights   01/06/18 0241 01/06/18 0600 01/07/18 0353  Weight: 78.6 kg (173 lb 4.5 oz) 78.5 kg (173 lb 1 oz) 75.1 kg (165 lb 9.1 oz)    History of present illness:  67 y.o. male with medical history significant of EtOH abuse, HTN, A.Fib.  Patient presents to the ED with c/o abd pain.  Pt diagnosed with pancreatitis and hepatitis secondary to alcohol use.  Hospital Course:  #Acute pancreatitis, suspect alcohol induced, improving - GI following (now signed off) and agrees, has outpatient follow-up arranged - Pt reports tolerating low fat.  #Elevated transaminases and hyperbilirubinemia secondary to alcoholic hepatitis, improving Abdominal ultrasound: Distended gallbladder with hepatic steatosis and no signs of cholelithiasis Hepatitis panel negative - Daily CMP   #Hyperammonemia, no symptoms consistent with hepatic encephalopathy Ultrasound shows no evidence of cirrhosis, but with alcohol hepatitis suspect this can lead to some elevation in ammonia (Ammonia 54 on admission, asterixis on 2/10, now resolved)  -We will continue lactulose at twice daily dosing on d/c -Appreciate GI recommendations, no current need for steroids  #Ethanol dependence - Pt with no signs of withdrawal on day of d/c  #Hypokalemia and hypomagnesemia, resolved Most likely from alcohol use. Mild on d/c day. With improvement in  oral intake has suspect these numbers will normalize   #Anemia, suspect chronic disease related to alcohol dependence (some liver pathology) disease, stable -Continue to monitor on CBC  #Hypertension, not at goal -Please continue antihypertensives listed below  Procedures:  None  Consultations: GI  Discharge Exam: Vitals:   01/07/18 0521 01/07/18 1255  BP:  125/76  Pulse: 66 63  Resp:    Temp:  98.6 F (37 C)  SpO2:      General: Pt in nad, alert and awake Cardiovascular: rrr, no rubs Respiratory: no increased wob, no wheezes  Discharge Instructions   Discharge Instructions    Diet - low sodium heart healthy   Complete by:  As directed    Increase activity slowly   Complete by:  As directed      Allergies as of 01/07/2018   No Known Allergies     Medication List    STOP taking these medications   irbesartan 150 MG tablet Commonly known as:  AVAPRO   valsartan 160 MG tablet Commonly known as:  DIOVAN     TAKE these medications   Albuterol Sulfate 108 (90 Base) MCG/ACT Aepb Inhale 2 puffs into the lungs every 4 (four) hours as needed for shortness of breath.   feeding supplement (ENSURE ENLIVE) Liqd Take 237 mLs by mouth 2 (two) times daily between meals.   folic acid 1 MG tablet Commonly known as:  FOLVITE Take 1 tablet (1 mg total) by mouth daily. Start taking on:  01/08/2018   hydrochlorothiazide 25 MG tablet Commonly known as:  HYDRODIURIL Take 25 mg by mouth daily.   lactulose 10 GM/15ML solution Commonly known as:  CHRONULAC Take 30 mLs (20  g total) by mouth 2 (two) times daily.   metoprolol tartrate 25 MG tablet Commonly known as:  LOPRESSOR Take 1 tablet (25 mg total) by mouth 2 (two) times daily.   pantoprazole 40 MG tablet Commonly known as:  PROTONIX Take 1 tablet (40 mg total) by mouth daily at 6 (six) AM. Start taking on:  01/08/2018      No Known Allergies Follow-up Information    [facility] Follow  up on 01/22/2018.   Specialty:  Internal Medicine Why:  @ 9:00 am with NP Julianne Handler for hospital follow up and to establish new Primary Care Provider.  Contact information: [address] [place] [address] [phone]       [facility]. Go to.   Why:  Please utilize this location for medication assistance with medications ranging from $4.00-$10.00.  Contact information: [address] [facility] [address] [phone]       Sammuel Cooper, PA-C Follow up on 01/19/2018.   Specialty:  Gastroenterology Why:  10 AM folllow up with GI PA for monitoring of liver and pancreas.   Contact information: [address] [place] [address] [phone]            The results of significant diagnostics from this hospitalization (including imaging, microbiology, ancillary and laboratory) are listed below for reference.    Significant Diagnostic Studies: Ct Abdomen Pelvis W Contrast  Result Date: 01/01/2018 CLINICAL DATA:  Nausea and vomiting.  Abdominal pain. EXAM: CT ABDOMEN AND PELVIS WITH CONTRAST TECHNIQUE: Multidetector CT imaging of the abdomen and pelvis was performed using the standard protocol following bolus administration of intravenous contrast. CONTRAST:  ISOVUE-300 IOPAMIDOL (ISOVUE-300) INJECTION 61% COMPARISON:  02/26/2017 FINDINGS: Lower chest: The lung bases are clear. Hepatobiliary: Advanced decreased hepatic density consistent with steatosis, with significant progression from prior exam. Gallbladder physiologically distended, no calcified stone. No biliary dilatation. Pancreas: Mild peripancreatic fat stranding about the pancreatic tail and possible head of the pancreas. No ductal dilatation. No evidence pancreatic necrosis. No evidence pancreatic mass. Spleen: Normal in size without focal abnormality. Adrenals/Urinary Tract: Normal adrenal glands. No hydronephrosis or perinephric edema.  Homogeneous renal enhancement with symmetric excretion on delayed phase imaging. Urinary bladder is physiologically distended without wall thickening. Stomach/Bowel: Stomach is nondistended. No bowel wall thickening, obstruction or inflammatory change. Normal appendix. Enteric sutures in the sigmoid colon. Vascular/Lymphatic: Aortic atherosclerosis. No aneurysm. Splenic and portal veins are patent. Small periportal and upper retroperitoneal nodes are likely reactive. No pathologically enlarged lymph nodes in the abdomen or pelvis. Reproductive: Prominent prostate gland spans 5.3 cm. Other: No free air, free fluid, or intra-abdominal fluid collection. Musculoskeletal: There are no acute or suspicious osseous abnormalities. IMPRESSION: 1. Mild peripancreatic fat stranding consistent with acute pancreatitis. No focal fluid collection or pancreatic necrosis. 2. Advanced hepatic steatosis, with significant progression over the past 10 months. 3.  Aortic Atherosclerosis (ICD10-I70.0). Electronically Signed   By: Rubye Oaks M.D.   On: 01/01/2018 22:56   US Abdomen Limited  Result Date: 01/02/2018 CLINICAL DATA:  Right upper quadrant pain for 1 week EXAM: ULTRASOUND ABDOMEN LIMITED RIGHT UPPER QUADRANT COMPARISON:  01/01/2018 FINDINGS: Gallbladder: Gallbladder is well distended. No definitive cholelithiasis is seen. Mild gallbladder sludge is noted. No pericholecystic fluid is noted. Common bile duct: Diameter: 4.5 mm. Liver: Diffuse increased echogenicity consistent with fatty infiltration. No focal mass lesion is noted. Portal vein is patent on color Doppler imaging with  normal direction of blood flow towards the liver. IMPRESSION: Gallbladder sludge without cholelithiasis. Fatty liver. Electronically Signed   By: Alcide CleverMark  Lukens M.D.   On: 01/02/2018 09:07    Microbiology: No results found for this or any previous visit (from the past 240 hour(s)).   Labs: Basic Metabolic Panel: Recent Labs  Lab  01/01/18 2346  01/02/18 1327 01/03/18 0416 01/03/18 0723 01/03/18 1351 01/04/18 0543 01/05/18 0424 01/06/18 0345  NA  --    < > 133* 134*  --   --  133* 136 134*  K  --    < > 2.3* 2.8*  --  3.2* 2.9* 3.6 3.3*  CL  --    < > 100* 100*  --   --  101 103 101  CO2  --    < > 22 24  --   --  24 24 23   GLUCOSE  --    < > 123* 202*  --   --  146* 121* 135*  BUN  --    < > 6 <5*  --   --  <5* <5* <5*  CREATININE  --    < > 0.73 0.59*  --   --  0.57* 0.46* 0.66  CALCIUM  --    < > 7.4* 7.8*  --   --  8.1* 8.1* 8.4*  MG 1.8  --   --   --  1.5*  --  1.5* 1.6*  --    < > = values in this interval not displayed.   Liver Function Tests: Recent Labs  Lab 01/02/18 0258 01/03/18 0416 01/04/18 0543 01/05/18 0424 01/06/18 0345  AST 284* 241* 217* 186* 162*  ALT 101* 97* 98* 93* 82*  ALKPHOS 109 120 117 110 107  BILITOT 4.8* 3.8* 2.4* 2.0* 1.7*  PROT 5.2* 5.2* 5.0* 5.2* 5.4*  ALBUMIN 2.6* 2.4* 2.2* 2.2* 2.3*   Recent Labs  Lab 01/01/18 2025 01/03/18 1351 01/04/18 0543  LIPASE 279* 356* 629*   Recent Labs  Lab 01/03/18 1729  AMMONIA 54*   CBC: Recent Labs  Lab 01/01/18 2025 01/02/18 0258 01/03/18 0416 01/05/18 0906  WBC 8.5 5.7 4.2 6.3  HGB 12.6* 10.4* 10.6* 10.8*  HCT 35.1* 29.1* 28.7* 32.2*  MCV 97.5 98.6 98.6 101.3*  PLT 159 129* 166 276   Cardiac Enzymes: No results for input(s): CKTOTAL, CKMB, CKMBINDEX, TROPONINI in the last 168 hours. BNP: BNP (last 3 results) Recent Labs    02/26/17 1356  BNP 156.2*    ProBNP (last 3 results) No results for input(s): PROBNP in the last 8760 hours.  CBG: No results for input(s): GLUCAP in the last 168 hours.   Signed:  Penny Piarlando Evangeline Utley MD.  [facility] 01/07/2018, 12:58 PM

## 2018-01-20 ENCOUNTER — Ambulatory Visit: Payer: Self-pay | Admitting: Physician Assistant

## 2018-01-22 ENCOUNTER — Ambulatory Visit: Payer: Self-pay | Admitting: Family Medicine

## 2018-02-18 DIAGNOSIS — F17210 Nicotine dependence, cigarettes, uncomplicated: Secondary | ICD-10-CM | POA: Insufficient documentation

## 2018-02-18 DIAGNOSIS — F419 Anxiety disorder, unspecified: Secondary | ICD-10-CM | POA: Insufficient documentation

## 2018-02-18 DIAGNOSIS — F10220 Alcohol dependence with intoxication, uncomplicated: Secondary | ICD-10-CM | POA: Insufficient documentation

## 2018-02-18 DIAGNOSIS — I4891 Unspecified atrial fibrillation: Secondary | ICD-10-CM | POA: Insufficient documentation

## 2018-02-18 DIAGNOSIS — I10 Essential (primary) hypertension: Secondary | ICD-10-CM | POA: Insufficient documentation

## 2018-02-18 NOTE — ED Triage Notes (Signed)
Pt brought in from home with c/o nausea and dizziness  Pt states he has been drinking rum today  Has not eaten in 4 days due to nausea  Pt has not been taking his medication

## 2018-02-19 ENCOUNTER — Encounter (HOSPITAL_COMMUNITY): Payer: Self-pay | Admitting: Emergency Medicine

## 2018-02-19 ENCOUNTER — Other Ambulatory Visit: Payer: Self-pay

## 2018-02-19 ENCOUNTER — Emergency Department (HOSPITAL_COMMUNITY)
Admission: EM | Admit: 2018-02-19 | Discharge: 2018-02-19 | Disposition: A | Discharge status: Home / Self Care | Payer: Self-pay | Attending: Emergency Medicine | Admitting: Emergency Medicine

## 2018-02-19 DIAGNOSIS — F10929 Alcohol use, unspecified with intoxication, unspecified: Secondary | ICD-10-CM

## 2018-02-19 DIAGNOSIS — F1010 Alcohol abuse, uncomplicated: Secondary | ICD-10-CM

## 2018-02-19 DIAGNOSIS — F1020 Alcohol dependence, uncomplicated: Secondary | ICD-10-CM

## 2018-02-19 LAB — URINALYSIS, ROUTINE W REFLEX MICROSCOPIC
Bacteria, UA: NONE SEEN
Bilirubin Urine: NEGATIVE
CELLULAR CAST UA: 15
GLUCOSE, UA: NEGATIVE mg/dL
Ketones, ur: 80 mg/dL — AB
LEUKOCYTES UA: NEGATIVE
Nitrite: NEGATIVE
PH: 6 (ref 5.0–8.0)
Protein, ur: 100 mg/dL — AB
Specific Gravity, Urine: 1.016 (ref 1.005–1.030)

## 2018-02-19 LAB — CBC WITH DIFFERENTIAL/PLATELET
BASOS ABS: 0.1 10*3/uL (ref 0.0–0.1)
BASOS PCT: 2 %
EOS ABS: 0 10*3/uL (ref 0.0–0.7)
EOS PCT: 0 %
HEMATOCRIT: 42.5 % (ref 39.0–52.0)
Hemoglobin: 14.8 g/dL (ref 13.0–17.0)
Lymphocytes Relative: 31 %
Lymphs Abs: 1.1 10*3/uL (ref 0.7–4.0)
MCH: 34.4 pg — ABNORMAL HIGH (ref 26.0–34.0)
MCHC: 34.8 g/dL (ref 30.0–36.0)
MCV: 98.8 fL (ref 78.0–100.0)
MONO ABS: 0.5 10*3/uL (ref 0.1–1.0)
MONOS PCT: 13 %
Neutro Abs: 1.9 10*3/uL (ref 1.7–7.7)
Neutrophils Relative %: 54 %
Platelets: 145 10*3/uL — ABNORMAL LOW (ref 150–400)
RBC: 4.3 MIL/uL (ref 4.22–5.81)
RDW: 13.8 % (ref 11.5–15.5)
WBC: 3.5 10*3/uL — ABNORMAL LOW (ref 4.0–10.5)

## 2018-02-19 LAB — COMPREHENSIVE METABOLIC PANEL
ALBUMIN: 4.1 g/dL (ref 3.5–5.0)
ALT: 69 U/L — ABNORMAL HIGH (ref 17–63)
ANION GAP: 24 — AB (ref 5–15)
AST: 244 U/L — AB (ref 15–41)
Alkaline Phosphatase: 92 U/L (ref 38–126)
BILIRUBIN TOTAL: 2.2 mg/dL — AB (ref 0.3–1.2)
BUN: 9 mg/dL (ref 6–20)
CHLORIDE: 99 mmol/L — AB (ref 101–111)
CO2: 16 mmol/L — ABNORMAL LOW (ref 22–32)
Calcium: 8.2 mg/dL — ABNORMAL LOW (ref 8.9–10.3)
Creatinine, Ser: 0.64 mg/dL (ref 0.61–1.24)
GFR calc Af Amer: >60 mL/min (ref >60)
GFR calc non Af Amer: >60 mL/min (ref >60)
GLUCOSE: 89 mg/dL (ref 65–99)
Potassium: 2.8 mmol/L — ABNORMAL LOW (ref 3.5–5.1)
SODIUM: 139 mmol/L (ref 135–145)
TOTAL PROTEIN: 7.7 g/dL (ref 6.5–8.1)

## 2018-02-19 LAB — LIPASE, BLOOD: Lipase: 41 U/L (ref 11–51)

## 2018-02-19 LAB — ETHANOL: Alcohol, Ethyl (B): 240 mg/dL — ABNORMAL HIGH (ref <10)

## 2018-02-19 LAB — PROTIME-INR
INR: 1.04
Prothrombin Time: 13.5 seconds (ref 11.4–15.2)

## 2018-02-19 LAB — AMMONIA: Ammonia: 56 umol/L — ABNORMAL HIGH (ref 9–35)

## 2018-02-19 MED ORDER — VITAMIN B-1 100 MG PO TABS
100.0000 mg | ORAL_TABLET | Freq: Every day | ORAL | Status: DC
  Administered 2018-02-19: 100 mg via ORAL
  Filled 2018-02-19: qty 1

## 2018-02-19 MED ORDER — ONDANSETRON 4 MG PO TBDP
4.0000 mg | ORAL_TABLET | Freq: Once | ORAL | Status: AC
  Administered 2018-02-19: 4 mg via ORAL
  Filled 2018-02-19: qty 1

## 2018-02-19 MED ORDER — LORAZEPAM 1 MG PO TABS: 0.0000 mg | ORAL_TABLET | Freq: Two times a day (BID) | ORAL | Status: DC

## 2018-02-19 MED ORDER — LORAZEPAM 1 MG PO TABS
1.0000 mg | ORAL_TABLET | Freq: Once | ORAL | Status: AC
  Administered 2018-02-19: 1 mg via ORAL
  Filled 2018-02-19: qty 1

## 2018-02-19 MED ORDER — LORAZEPAM 1 MG PO TABS
0.0000 mg | ORAL_TABLET | Freq: Four times a day (QID) | ORAL | Status: DC
  Administered 2018-02-19 (×2): 1 mg via ORAL
  Filled 2018-02-19: qty 1

## 2018-02-19 MED ORDER — THIAMINE HCL 100 MG/ML IJ SOLN: 100.0000 mg | Freq: Every day | INTRAMUSCULAR | Status: DC

## 2018-02-19 MED ORDER — LORAZEPAM 2 MG/ML IJ SOLN: 0.0000 mg | Freq: Four times a day (QID) | INTRAMUSCULAR | Status: DC

## 2018-02-19 MED ORDER — LORAZEPAM 2 MG/ML IJ SOLN: 0.0000 mg | Freq: Two times a day (BID) | INTRAMUSCULAR | Status: DC

## 2018-02-19 MED ORDER — CHLORDIAZEPOXIDE HCL 25 MG PO CAPS: ORAL_CAPSULE | ORAL | 0 refills | Status: DC

## 2018-02-19 MED ORDER — POTASSIUM CHLORIDE CRYS ER 20 MEQ PO TBCR
40.0000 meq | EXTENDED_RELEASE_TABLET | Freq: Once | ORAL | Status: AC
  Administered 2018-02-19: 40 meq via ORAL
  Filled 2018-02-19: qty 2

## 2018-02-19 NOTE — ED Notes (Signed)
Patient made aware urine sample is needed. 

## 2018-02-19 NOTE — ED Notes (Signed)
MD made aware patient's discharge concerns.

## 2018-02-19 NOTE — ED Notes (Signed)
Bed: WA01 Expected date:  Expected time:  Means of arrival:  Comments: 

## 2018-02-19 NOTE — ED Notes (Signed)
Pt performed fluid challenge and c/o persisting nausea, but no vomiting. Received a verbal order from EDP to give ODT Zofran for relief.

## 2018-02-19 NOTE — ED Notes (Signed)
Pt is very shaky, needed assist to transfer from w/c to bed

## 2018-02-19 NOTE — ED Notes (Signed)
Patient given ice water 

## 2018-02-19 NOTE — ED Provider Notes (Signed)
[facility]   CSN: [id number] Arrival date & time: 02/18/18  2356     History   Chief Complaint Chief Complaint  Patient presents with  . Nausea  . Dizziness    HPI Alvin Benton is a 67 y.o. male.  He presents for evaluation of nausea and vomiting, starting several days ago.  He has not been taking any of his medications, for several months because "I lost my insurance."  He continues to drink alcohol daily.  He denies fever, chills, cough no shortness of breath, weakness or dizziness.  He is concerned about bruising on his arms bilaterally.  No specific known trauma.  He lives alone in an apartment in town.  He came here by EMS for evaluation.  There are no other known modifying factors.  HPI  Past Medical History:  Diagnosis Date  . Atrial fibrillation (HCC) 06/2017  . Blood in stool   . Diverticulitis   . ETOH abuse   . GERD (gastroesophageal reflux disease)   . Heart murmur   . History of urinary tract infection   . Hypertension   . IBS (irritable bowel syndrome)   . Migraine    "none in a long time" (03/14/2016)  . Nocturia   . Pneumonia    childhood  . Seasonal allergies   . Tobacco abuse     Patient Active Problem List   Diagnosis Date Noted  . Acute alcoholic pancreatitis 01/01/2018  . Acute alcoholic hepatitis 01/01/2018  . Elevated troponin   . Nausea & vomiting 07/21/2017  . Atrial fibrillation with RVR (HCC) 07/21/2017  . Alcoholic ketoacidosis 07/21/2017  . PNA (pneumonia): pROBABLE ASPIRATION 05/21/2017  . Hyponatremia 05/20/2017  . Dehydration 05/20/2017  . Syncope 02/26/2017  . History of atrial fibrillation 02/26/2017  . Right flank hematoma 02/26/2017  . Zygomatic fracture, right side, initial encounter for closed fracture (HCC) 02/26/2017  . Multiple rib fractures 02/26/2017  . Acute pulmonary edema (HCC)   . Dizziness   . Fall   . Dehydration with hyponatremia 02/20/2017  . Alcohol  dependence in early full remission (HCC) 06/04/2016  . Volume depletion 06/04/2016  . Hypokalemia 06/04/2016  . Diverticulitis of intestine with perforation without bleeding 06/04/2016  . Essential hypertension   . Hypomagnesemia   . Ileus (HCC) 05/05/2016  . Systolic murmur 05/05/2016  . Anxiety state 04/19/2016  . 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 Hypoxia 04/05/2016  . Atrial fibrillation (HCC) 04/05/2016  . Diverticulitis of large intestine with perforation and abscess 03/14/2016  . Chronic constipation 03/08/2016  . Alcohol abuse 02/15/2016  . Tobacco use disorder 07/17/2015  . Allergic rhinitis 07/17/2015  . Reactive airway disease with wheezing 07/17/2015  . Overweight (BMI 25.0-29.9) 07/17/2015  . Hand pain, left 01/12/2014  . LLQ pain-chronic, intermittent and episodic 09/17/2013    Past Surgical History:  Procedure Laterality Date  . COLONOSCOPY    . CYSTOSCOPY WITH STENT PLACEMENT Left 10/03/2016   Procedure: CYSTOSCOPY WITH  LEFT URETERAL CATHETER PLACEMENT;  Surgeon: Bjorn Pippin, MD;  Location: [facility];  Service: Urology;  Laterality: Left;  . PROCTOSCOPY  10/03/2016   Procedure: PROCTOSCOPY;  Surgeon: Romie Levee, MD;  Location: [facility];  Service: General;;  . RIGHT/LEFT HEART CATH AND CORONARY ANGIOGRAPHY N/A 07/22/2017   Procedure: RIGHT/LEFT HEART CATH AND CORONARY ANGIOGRAPHY;  Surgeon: [place], Peter M, MD;  Location: [facility] CV LAB;  Service: Cardiovascular;  Laterality: N/A;  . TONSILLECTOMY  Home Medications    Prior to Admission medications   Medication Sig Start Date End Date Taking? Authorizing Provider  Albuterol Sulfate 108 (90 Base) MCG/ACT AEPB Inhale 2 puffs into the lungs every 4 (four) hours as needed for shortness of breath. 02/11/17  Yes [provider]  feeding supplement, ENSURE ENLIVE, (ENSURE ENLIVE) LIQD Take 237 mLs by mouth 2 (two) times daily between meals. Patient not taking: Reported on 02/19/2018 01/07/18   Penny Pia, MD  folic acid (FOLVITE) 1 MG tablet Take 1 tablet (1 mg total) by mouth daily. Patient not taking: Reported on 02/19/2018 01/08/18   Penny Pia, MD  lactulose (CHRONULAC) 10 GM/15ML solution Take 30 mLs (20 g total) by mouth 2 (two) times daily. Patient not taking: Reported on 02/19/2018 01/07/18   Penny Pia, MD  metoprolol tartrate (LOPRESSOR) 25 MG tablet Take 1 tablet (25 mg total) by mouth 2 (two) times daily. 07/22/17 08/21/17  Arrien, York Ram, MD  pantoprazole (PROTONIX) 40 MG tablet Take 1 tablet (40 mg total) by mouth daily at 6 (six) AM. Patient not taking: Reported on 02/19/2018 01/08/18   Penny Pia, MD    Family History Family History  Problem Relation Age of Onset  . Stroke Mother   . Colon cancer Neg Hx   . Esophageal cancer Neg Hx   . Rectal cancer Neg Hx   . Stomach cancer Neg Hx     Social History Social History   Tobacco Use  . Smoking status: Current Every Day Smoker    Packs/day: 1.00    Years: 40.00    Pack years: 40.00    Types: Cigarettes  . Smokeless tobacco: Never Used  Substance Use Topics  . Alcohol use: Yes    Alcohol/week: 1.8 oz    Types: 3 Shots of liquor per week    Comment: pt states does not drink daily has beer with football  . Drug use: No     Allergies   Patient has no known allergies.   Review of Systems Review of Systems  All other systems reviewed and are negative.    Physical Exam Updated Vital Signs BP (!) 174/86   Pulse 80   Temp 99 F (37.2 C) (Oral)   Resp 16   SpO2 98%   Physical Exam  Constitutional: He is oriented to person, place, and time. He appears well-developed. He appears distressed (He is uncomfortable).  Appears older than stated age.  HENT:  Head: Normocephalic and atraumatic.  Right Ear: External ear normal.  Left Ear: External ear normal.  Eyes: Pupils are equal, round, and reactive to light. Conjunctivae and EOM are normal.  Neck: Normal range of motion and phonation  normal. Neck supple.  Cardiovascular: Normal rate, regular rhythm and normal heart sounds.  Pulmonary/Chest: Effort normal and breath sounds normal. No respiratory distress. He exhibits no bony tenderness.  Abdominal: Soft. He exhibits distension (Mild). There is no tenderness.  Musculoskeletal: Normal range of motion.  Neurological: He is alert and oriented to person, place, and time. No cranial nerve deficit or sensory deficit. He exhibits normal muscle tone. Coordination normal.  Mild tremulousness  Skin: Skin is warm, dry and intact.  Scattered superficial bruises to forearms bilaterally.  Psychiatric: He has a normal mood and affect. His behavior is normal. Judgment and thought content normal.  Nursing note and vitals reviewed.    ED Treatments / Results  Labs (all labs ordered are listed, but only abnormal results are displayed) Labs Reviewed  CBC WITH DIFFERENTIAL/PLATELET - Abnormal; Notable for the following components:      Result Value   WBC 3.5 (*)    MCH 34.4 (*)    Platelets 145 (*)    All other components within normal limits  COMPREHENSIVE METABOLIC PANEL - Abnormal; Notable for the following components:   Potassium 2.8 (*)    Chloride 99 (*)    CO2 16 (*)    Calcium 8.2 (*)    AST 244 (*)    ALT 69 (*)    Total Bilirubin 2.2 (*)    Anion gap 24 (*)    All other components within normal limits  ETHANOL - Abnormal; Notable for the following components:   Alcohol, Ethyl (B) 240 (*)    All other components within normal limits  URINALYSIS, ROUTINE W REFLEX MICROSCOPIC - Abnormal; Notable for the following components:   Color, Urine AMBER (*)    Hgb urine dipstick SMALL (*)    Ketones, ur 80 (*)    Protein, ur 100 (*)    All other components within normal limits  AMMONIA - Abnormal; Notable for the following components:   Ammonia 56 (*)    All other components within normal limits  LIPASE, BLOOD  PROTIME-INR    EKG None  Radiology No results  found.  Procedures Procedures (including critical care time)  Medications Ordered in ED Medications - No data to display   Initial Impression / Assessment and Plan / ED Course  I have reviewed the triage vital signs and the nursing notes.  Pertinent labs & imaging results that were available during my care of the patient were reviewed by me and considered in my medical decision making (see chart for details).  Clinical Course as of Feb 19 1418  Thu Feb 19, 2018  1413 Normal except elevated ketones, and protein  Urinalysis, Routine w reflex microscopic(!) [EW]  1417 Normal  Protime-INR [EW]  1417 Normal  CBC with Differential(!) [EW]  1417 Elevated, unchanged from 1 month ago  Ammonia(!) [EW]  1417 Normal except potassium low 2.8, chloride low 99, CO2 low 16, calcium low 8.2, AST high 244, ALT high 69, total bilirubin high 2.2, anion gap elevated 24  Comprehensive metabolic panel(!) [EW]  1418 Elevated  Ethanol(!) [EW]  1418 normal  Lipase, blood [EW]  1419 Elevated  BP(!): 174/86 [EW]    Clinical Course User Index [EW] Mancel Bale, MD     Patient Vitals for the past 24 hrs:  BP Temp Temp src Pulse Resp SpO2  02/19/18 1300 (!) 174/86 - - 80 16 98 %  02/19/18 1234 (!) 167/117 - - (!) 32 - 93 %  02/19/18 1122 (!) 173/86 - - 82 18 98 %  02/19/18 1118 (!) 183/67 - - 79 20 99 %  02/19/18 0410 (!) 147/70 - - 88 16 100 %  02/18/18 2358 (!) 145/77 99 F (37.2 C) Oral (!) 109 18 95 %    2:50 PM Reevaluation with update and discussion. After initial assessment and treatment, an updated evaluation reveals patient is alert and cooperative.  He feels better after taking Zofran.  He is more tremulous at this time.  Alcohol was elevated on arrival, now is likely 0, based on usual kinetics.  He states that at this time he is "seeing things like cartoons," when he closes his eyes.  Will treat for alcohol withdrawal syndrome, assess he will, then consider disposition.Mancel Bale    4:25 PM-patient with noticeably less  tremor after single dose of Ativan.  We discussed leaving that he would like to stay a bit longer to see how he does.  Food and fluid have been offered.  Medical decision making-alcohol intoxication, likely alcoholic, with mild alcohol withdrawal syndrome.  Incidental hypokalemia, likely associated with poor nutrition.  Patient required treatment with benzodiazepine for withdrawal symptoms, he is being assessed for need for additional medication.  Nursing Notes Reviewed/ Care Coordinated Applicable Imaging Reviewed Interpretation of Laboratory Data incorporated into ED treatment   Plan-as per oncoming provider team, to evaluate, after a short period of observation.  Final Clinical Impressions(s) / ED Diagnoses   Final diagnoses:  Alcoholic intoxication with complication (HCC)  Alcoholism [facility](HCC)    ED Discharge Orders    None       Mancel BaleWentz, Aniella Wandrey, MD 02/19/18 1654

## 2018-02-19 NOTE — ED Notes (Signed)
Pt asked if they could provide urine sample, pt stated "not at this time".

## 2018-02-19 NOTE — ED Provider Notes (Signed)
  Physical Exam  BP (!) 199/101 (BP Location: Left Arm)   Pulse (!) 58   Temp 98.1 F (36.7 C) (Oral)   Resp 17   SpO2 98%   Physical Exam  4:35 PM  Pt received in signout from Dr. Effie ShyWentz.  He was shaking and had CIWA of 7, he improved after ativan.  Will observe to see if he requires more benzos- plan for likely discharge home.    8:08 PM pt rechecked and was resting comfortably.  Upon talking with me he began to have shaking which appeared to only begin when I mentioned discharge.  His CIWA score was 7, HR on my eval was 89, mildly elevated BP, pt is alert and oriented x 3.  I d/w him that we would discharge him with rx for medication to help with withdrawal symptoms.  He verbalized understanding of this plan.  Discussed return precautions in detail with patient.  Given a po dose of ativan prior to discharge so that he can have his rx filled.        Phillis HaggisMabe, Martha L, MD 02/19/18 2010

## 2018-02-19 NOTE — Discharge Instructions (Signed)
Return to the ED with any concerns including vomiting and not able to keep down liquids, seizure activity, decreased level of alertness/lethargy, or any other alarming symptoms °

## 2018-07-03 IMAGING — CT CT ABD-PELV W/ CM
2 of 5 series · 15 of 46 positions shown, 17 images · IV contrast (iopamidol)
Comparison: Prior CT from 06/04/2016.

CLINICAL DATA: Initial evaluation for acute abdominal pain, fever,
weakness, nausea.

EXAM:
CT ABDOMEN AND PELVIS WITH CONTRAST
TECHNIQUE: Multidetector CT imaging of the abdomen and pelvis was performed
using the standard protocol following bolus administration of
intravenous contrast.
CONTRAST:  100mL ZNV1N6-MCC IOPAMIDOL (ZNV1N6-MCC) INJECTION 61%

[Series 3: abd/ pelvis 5.0 i30f 2 · axial · 0.85mm/px · z∈[+849,+1289]mm · 12 of 100 slices shown, 14 images]
[im 6/100  soft-tissue]
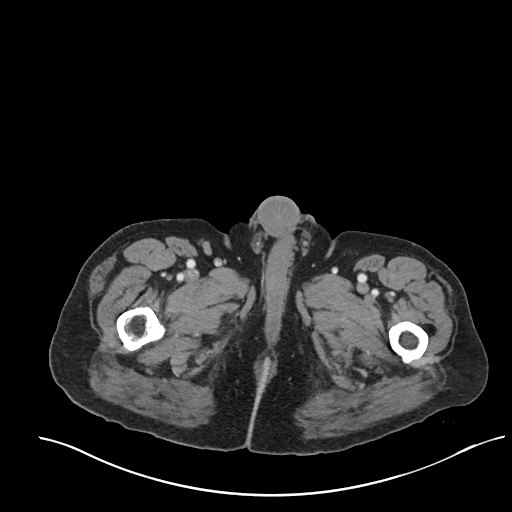
[im 6/100  bone]
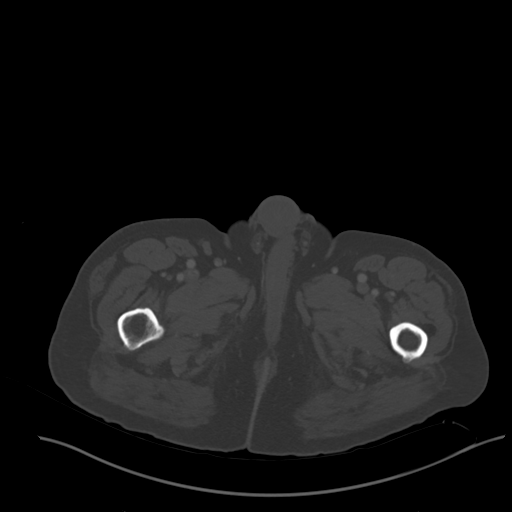
[im 16/100  soft-tissue]
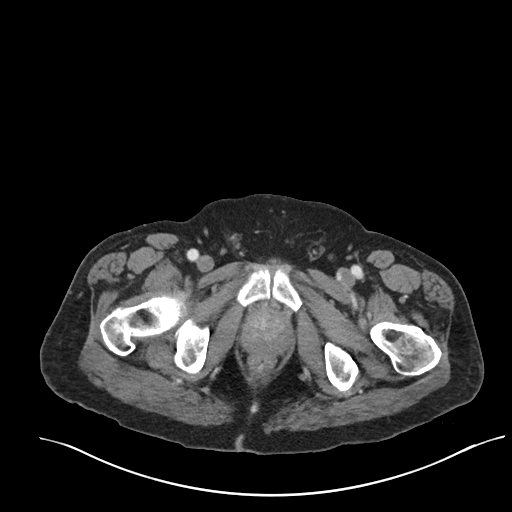
[im 21/100  soft-tissue]
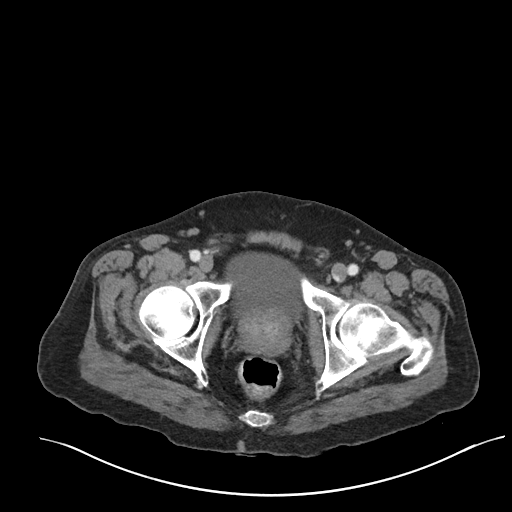
[im 32/100  soft-tissue]
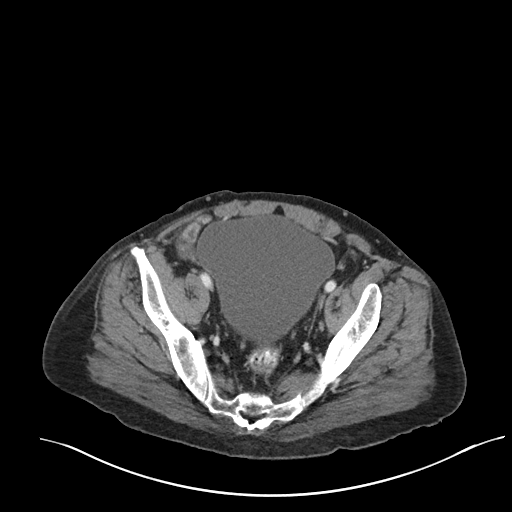
[im 37/100  soft-tissue]
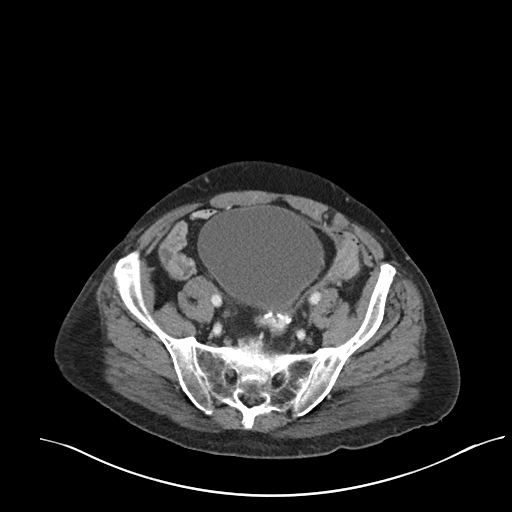
[im 47/100  soft-tissue]
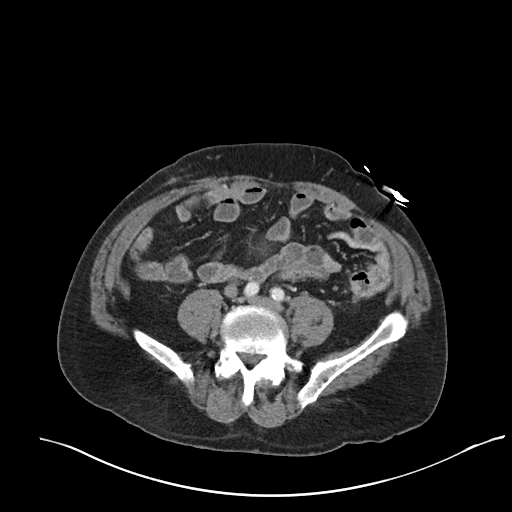
[im 53/100  soft-tissue]
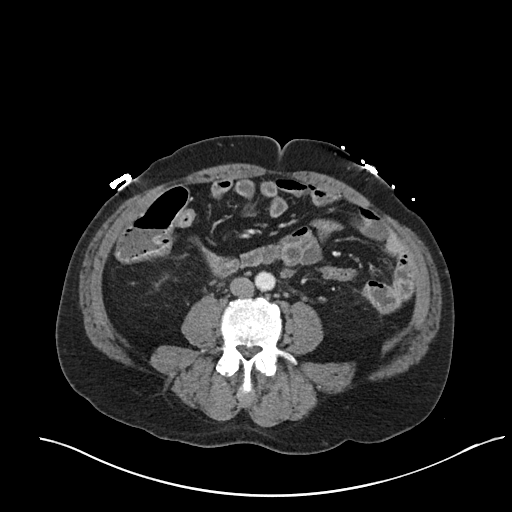
[im 63/100  soft-tissue]
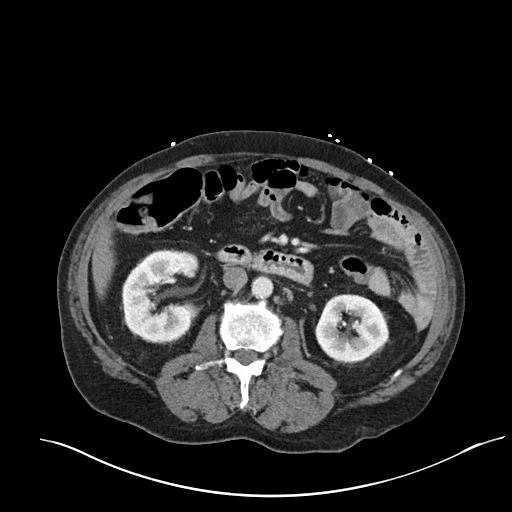
[im 68/100  soft-tissue]
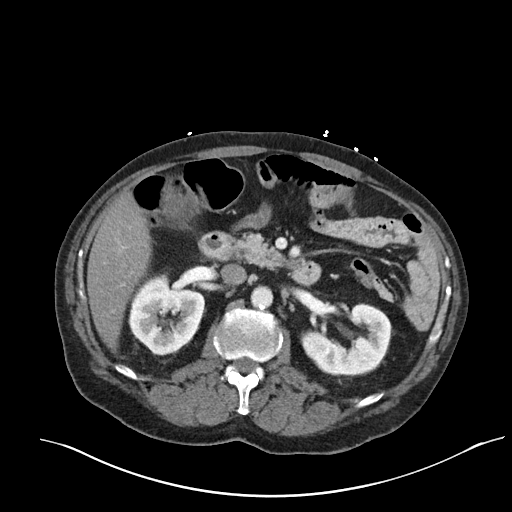
[im 68/100  bone]
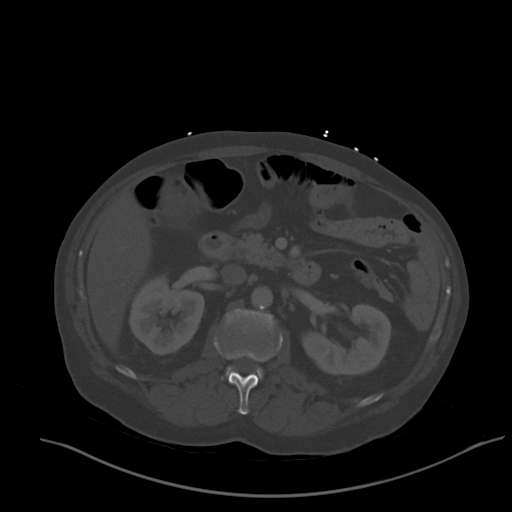
[im 79/100  soft-tissue]
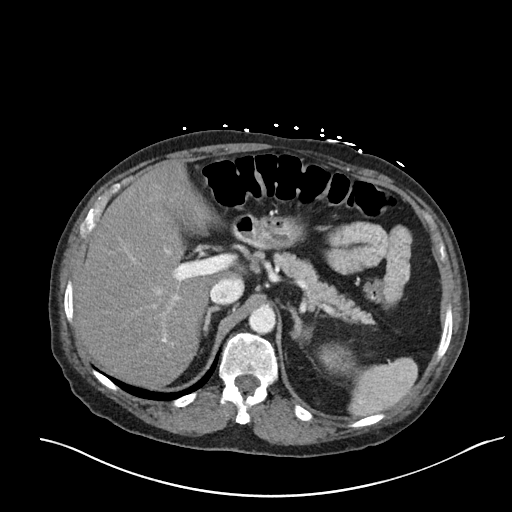
[im 84/100  soft-tissue]
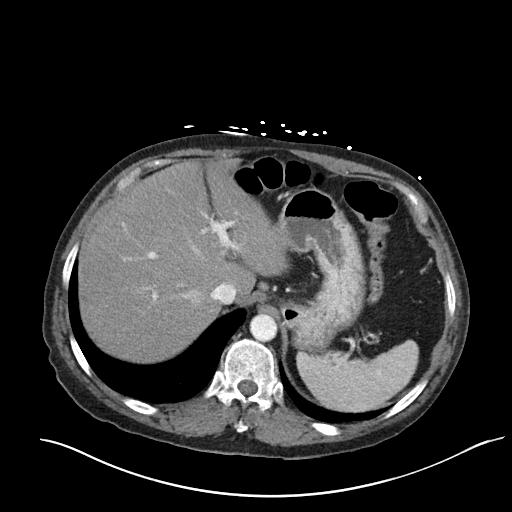
[im 94/100  soft-tissue]
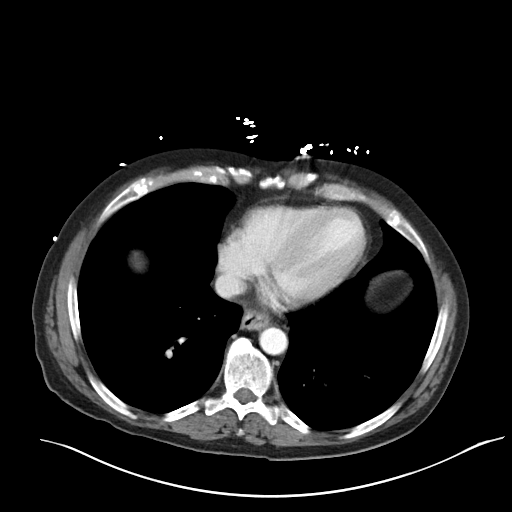

[Series 5: coronal soft tissue · coronal · 0.76mm/px · 3 of 101 slices shown]
[im 34/101  soft-tissue]
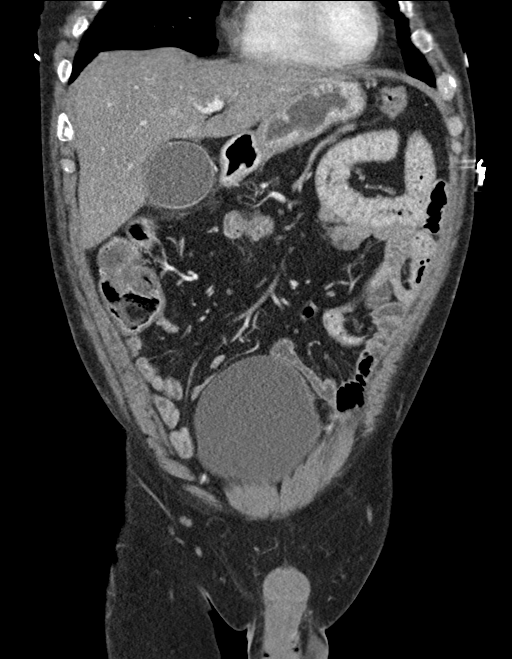
[im 45/101  soft-tissue]
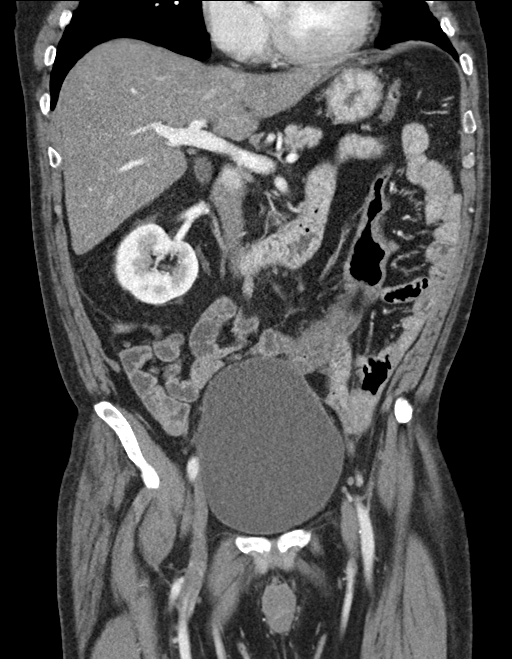
[im 56/101  soft-tissue]
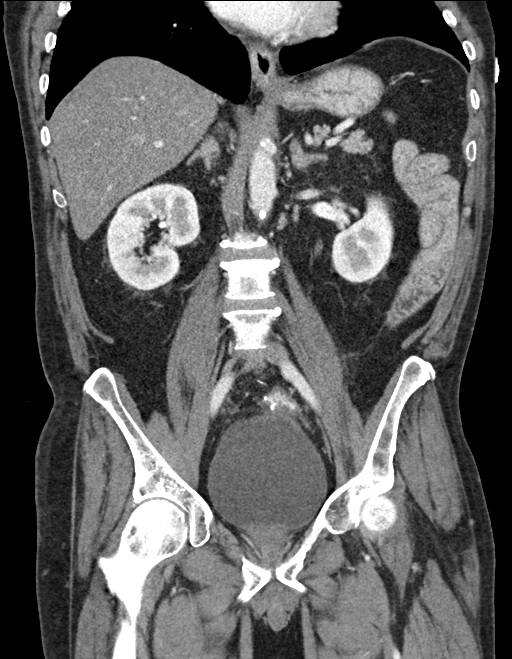

[15 of 46 positions shown; findings below may reference images not displayed]

FINDINGS: Lower chest: Mild bibasilar atelectatic changes. Visualized lung
bases are otherwise clear.

Hepatobiliary: Diffuse hypoattenuation liver consistent with
steatosis. Liver otherwise unremarkable. Gallbladder somewhat
prominent. Minimal hazy pericholecystic hazy stranding/edema. No
internal cholelithiasis. No biliary dilatation.

Pancreas: Pancreas within normal limits.

Spleen: Spleen within normal limits.

Adrenals/Urinary Tract: Adrenal glands are normal. Kidneys equal in
size with symmetric enhancement. No nephrolithiasis, hydronephrosis,
or focal enhancing renal mass. No hydroureter. Bladder fairly
distended without acute abnormality.

Stomach/Bowel: Stomach within normal limits. No evidence for bowel
obstruction appendix within normal limits. Sequelae of prior partial
colectomy with surgical anastomosis within the sigmoid colon noted.
No acute inflammatory changes seen about the bowels.

Vascular/Lymphatic: Mild aorto bi-iliac atherosclerotic disease. No
aneurysm. For opacification of the IMA proximally (series 3, image
47). Remaining branch vessels are well opacified and within normal
limits.

No pathologically enlarged intra-abdominal or pelvic lymph nodes.

Reproductive: Prostate mildly enlarged measuring 5.2 cm in
transverse diameter, similar to previous.

Other: No free air or fluid.

Musculoskeletal: No acute osseous abnormality. No worrisome lytic or
blastic osseous lesions. Scattered multilevel degenerative
spondylolysis and facet arthrosis noted within the visualized spine.
IMPRESSION: 1. Mildly prominent gallbladder with trace pericholecystic
stranding/edema. Correlation with laboratory values for possible
acute gallbladder pathology recommended. Additionally, further
evaluation with dedicated right upper quadrant ultrasound could be
performed for further evaluation as warranted.
2. Poor opacification of the proximal IMA. Findings of uncertain
significance, and may be simply related to technique on this non
angiographic examination. Correlation with physical exam and serum
lactate recommended for potential signs of intestinal ischemia. No
other secondary signs to suggest acute bowel ischemia identified on
this exam.
3. Hepatic steatosis.
4. Mildly enlarged prostate. Prominent urinary bladder distension
may reflect a degree of underlying outlet obstruction.

## 2018-07-03 IMAGING — CT CT HEAD W/O CM
3 of 5 series · 16 of 47 positions shown, 19 images · non-contrast
Comparison: None.

CLINICAL DATA: Multiple falls with head injury

EXAM:
CT HEAD WITHOUT CONTRAST
TECHNIQUE: Contiguous axial images were obtained from the base of the skull
through the vertex without intravenous contrast.

[Series 4: head 3.0 mpr cor · coronal · 0.53mm/px · 3 of 68 slices shown]
[im 23/68  brain]
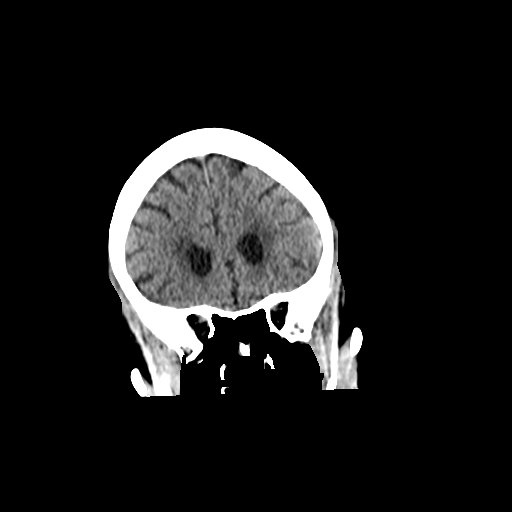
[im 30/68  brain]
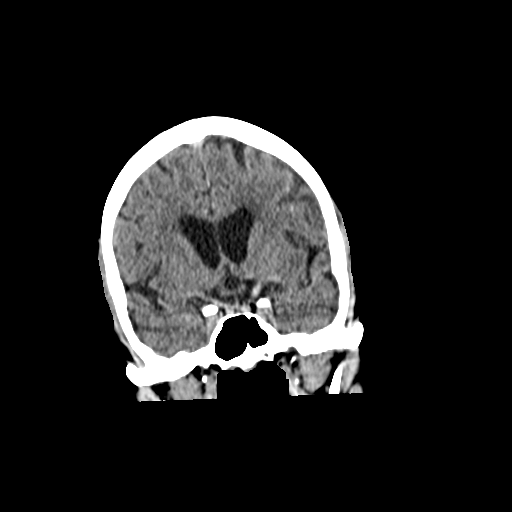
[im 38/68  brain]
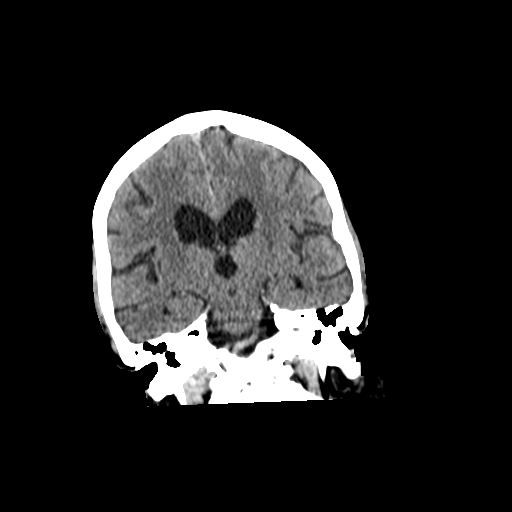

[Series 5: head 3.0 mpr sag · sagittal · 0.53mm/px · 3 of 54 slices shown]
[im 18/54  brain]
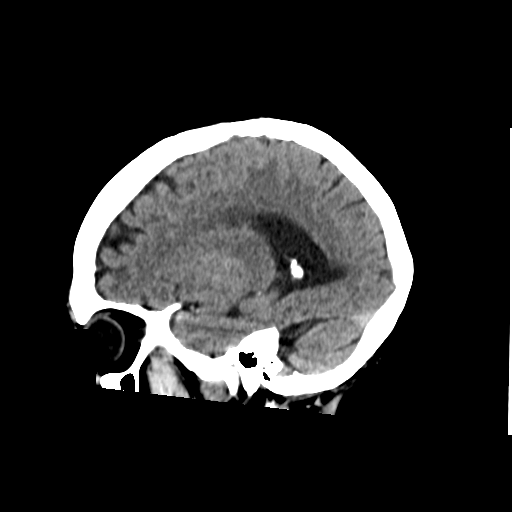
[im 27/54  brain]
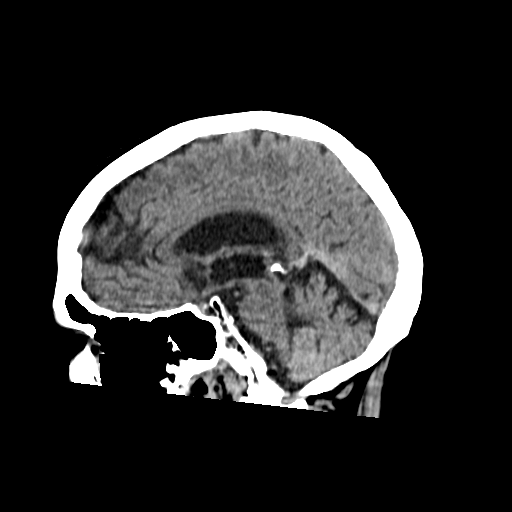
[im 36/54  brain]
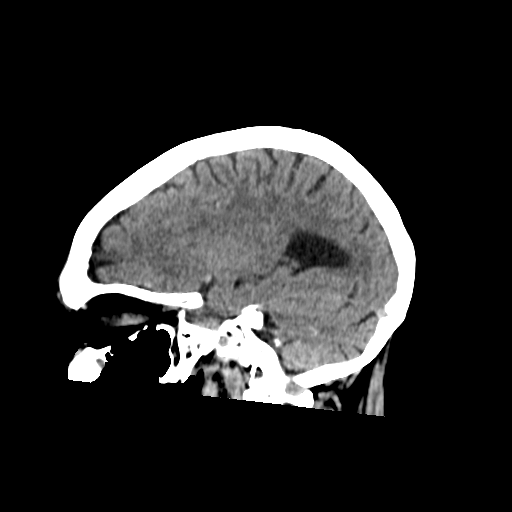

[Series 602: straight st · axial · 0.44mm/px · z∈[-164,-29]mm · 10 of 33 slices shown, 13 images]
[im 3/33  brain]
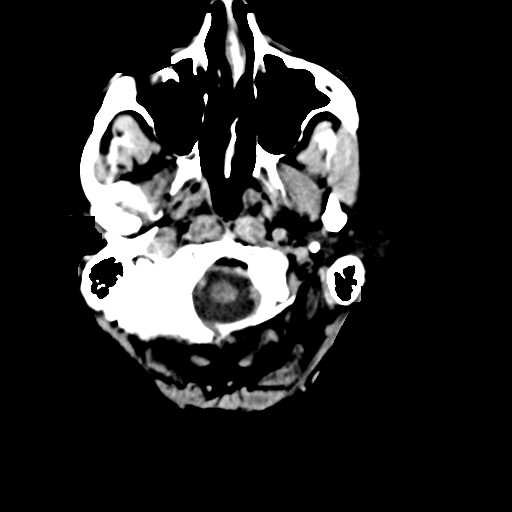
[im 3/33  bone]
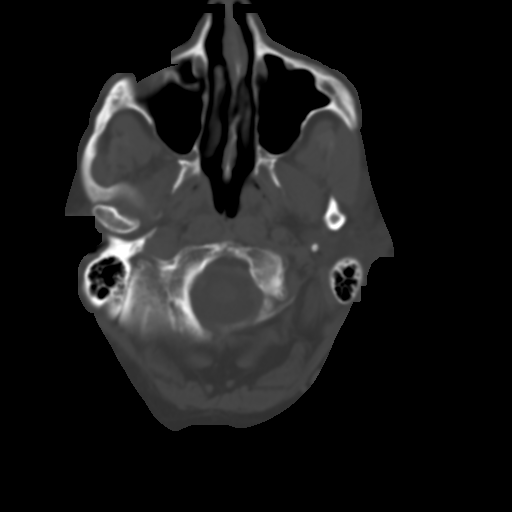
[im 5/33  brain]
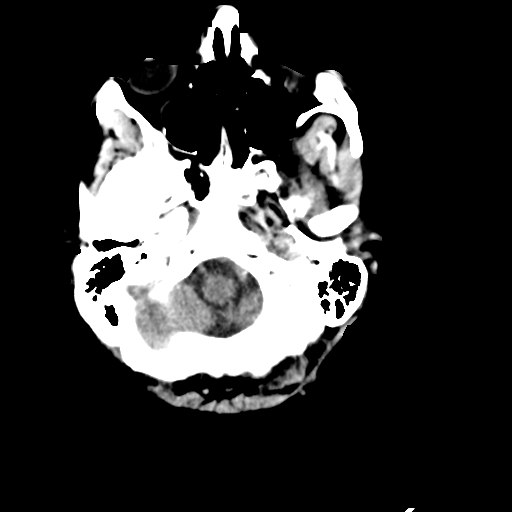
[im 10/33  brain]
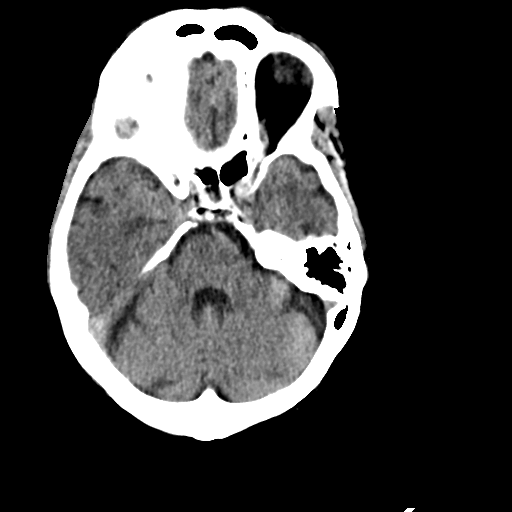
[im 12/33  brain]
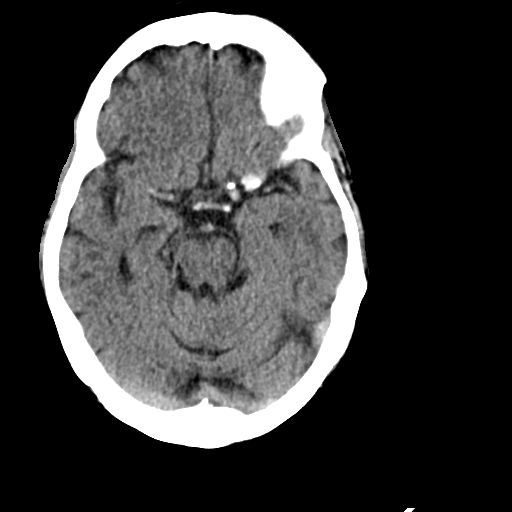
[im 14/33  brain]
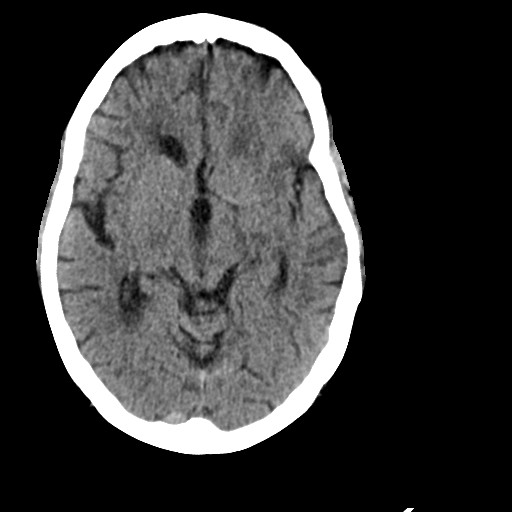
[im 14/33  bone]
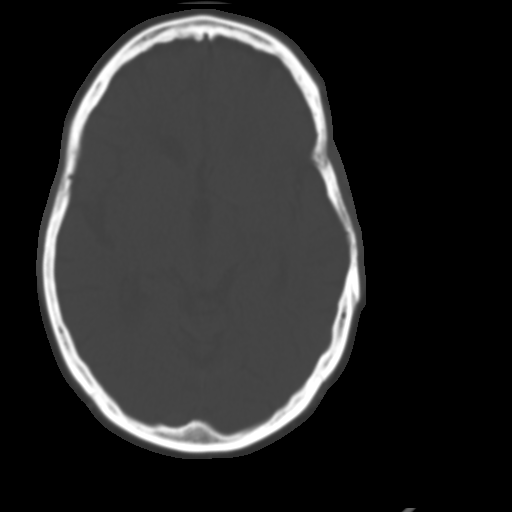
[im 19/33  brain]
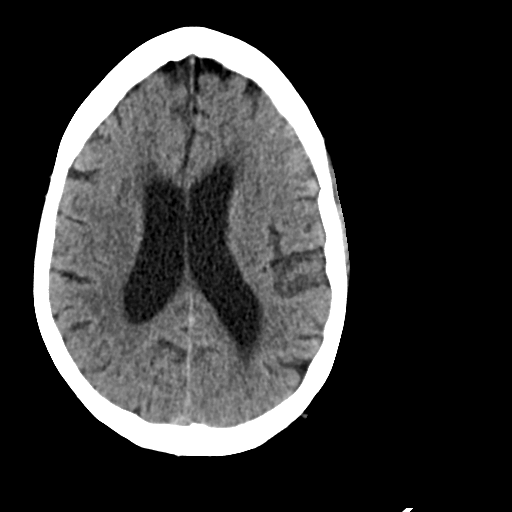
[im 21/33  brain]
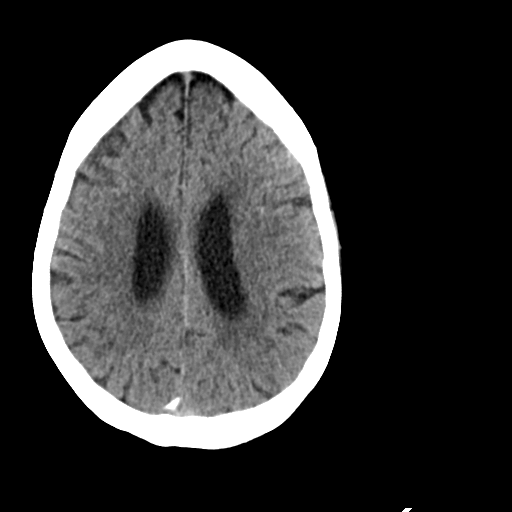
[im 23/33  brain]
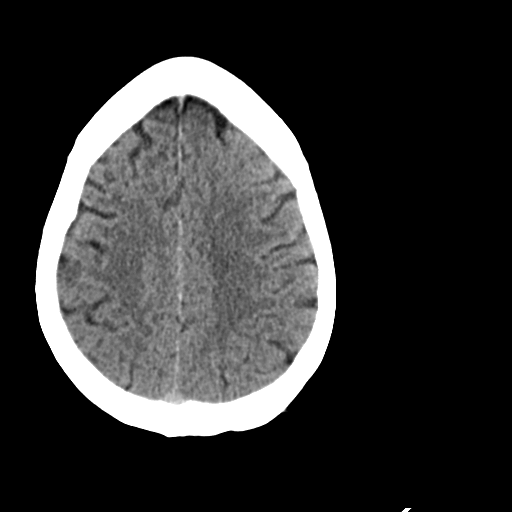
[im 28/33  brain]
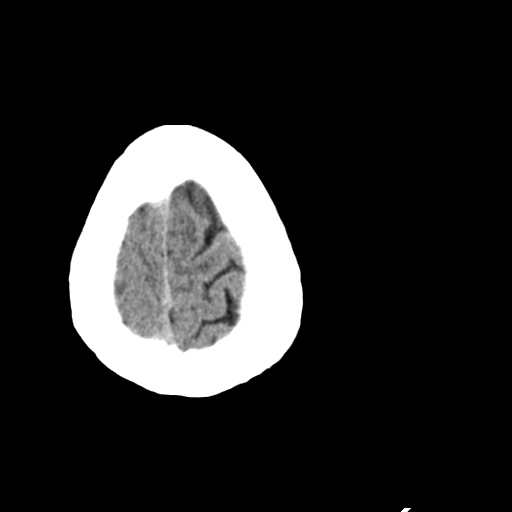
[im 28/33  bone]
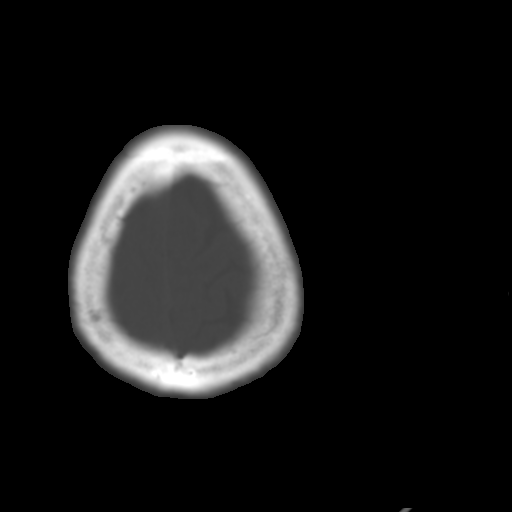
[im 30/33  brain]
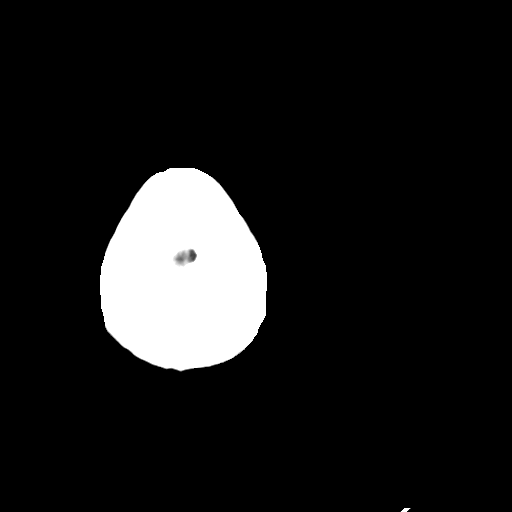

[16 of 47 positions shown; findings below may reference images not displayed]

FINDINGS: Brain: No definite acute territorial infarction, hemorrhage or focal
mass lesion is visualized. Mild periventricular white matter
hypodensity suspect secondary to small vessel ischemic changes. Mild
cortical atrophy. Slight ventricular prominence since, likely
related to the presence of atrophy.

Vascular: Slightly dense appearing proximal left MCA, suspect that
this is secondary to vessel tortuosity. Scattered calcifications at
the carotid siphons.

Skull: No fracture or suspicious bone lesion.

Sinuses/Orbits: Minimal mucosal thickening in the maxillary and
ethmoid sinuses. No acute orbital abnormality.

Other: None
IMPRESSION: No definite CT evidence for acute intracranial abnormality. Mild
periventricular white matter small vessel ischemic changes.

## 2020-05-25 ENCOUNTER — Emergency Department (HOSPITAL_COMMUNITY)
Admission: EM | Admit: 2020-05-25 | Discharge: 2020-05-26 | Disposition: A | Discharge status: Home / Self Care | Payer: Medicare Other | Attending: Emergency Medicine | Admitting: Emergency Medicine

## 2020-05-25 ENCOUNTER — Other Ambulatory Visit: Payer: Self-pay

## 2020-05-25 ENCOUNTER — Encounter (HOSPITAL_COMMUNITY): Payer: Self-pay | Admitting: Emergency Medicine

## 2020-05-25 ENCOUNTER — Emergency Department (HOSPITAL_COMMUNITY): Payer: Medicare Other

## 2020-05-25 DIAGNOSIS — F10129 Alcohol abuse with intoxication, unspecified: Secondary | ICD-10-CM | POA: Diagnosis not present

## 2020-05-25 DIAGNOSIS — F17210 Nicotine dependence, cigarettes, uncomplicated: Secondary | ICD-10-CM | POA: Insufficient documentation

## 2020-05-25 DIAGNOSIS — S32020A Wedge compression fracture of second lumbar vertebra, initial encounter for closed fracture: Secondary | ICD-10-CM

## 2020-05-25 DIAGNOSIS — I10 Essential (primary) hypertension: Secondary | ICD-10-CM | POA: Diagnosis not present

## 2020-05-25 DIAGNOSIS — R4182 Altered mental status, unspecified: Secondary | ICD-10-CM | POA: Diagnosis present

## 2020-05-25 DIAGNOSIS — F10920 Alcohol use, unspecified with intoxication, uncomplicated: Secondary | ICD-10-CM

## 2020-05-25 LAB — COMPREHENSIVE METABOLIC PANEL
ALT: 52 U/L — ABNORMAL HIGH (ref 0–44)
AST: 47 U/L — ABNORMAL HIGH (ref 15–41)
Albumin: 4.4 g/dL (ref 3.5–5.0)
Alkaline Phosphatase: 85 U/L (ref 38–126)
Anion gap: 19 — ABNORMAL HIGH (ref 5–15)
BUN: 10 mg/dL (ref 8–23)
CO2: 18 mmol/L — ABNORMAL LOW (ref 22–32)
Calcium: 9.6 mg/dL (ref 8.9–10.3)
Chloride: 105 mmol/L (ref 98–111)
Creatinine, Ser: 0.72 mg/dL (ref 0.61–1.24)
GFR calc Af Amer: >60 mL/min (ref >60)
GFR calc non Af Amer: >60 mL/min (ref >60)
Glucose, Bld: 82 mg/dL (ref 70–99)
Potassium: 4.6 mmol/L (ref 3.5–5.1)
Sodium: 142 mmol/L (ref 135–145)
Total Bilirubin: 0.7 mg/dL (ref 0.3–1.2)
Total Protein: 7.7 g/dL (ref 6.5–8.1)

## 2020-05-25 LAB — CBC WITH DIFFERENTIAL/PLATELET
Abs Immature Granulocytes: 0.02 10*3/uL (ref 0.00–0.07)
Basophils Absolute: 0.1 10*3/uL (ref 0.0–0.1)
Basophils Relative: 1 %
Eosinophils Absolute: 0.1 10*3/uL (ref 0.0–0.5)
Eosinophils Relative: 1 %
HCT: 51 % (ref 39.0–52.0)
Hemoglobin: 17.4 g/dL — ABNORMAL HIGH (ref 13.0–17.0)
Immature Granulocytes: 0 %
Lymphocytes Relative: 23 %
Lymphs Abs: 2 10*3/uL (ref 0.7–4.0)
MCH: 33 pg (ref 26.0–34.0)
MCHC: 34.1 g/dL (ref 30.0–36.0)
MCV: 96.8 fL (ref 80.0–100.0)
Monocytes Absolute: 0.6 10*3/uL (ref 0.1–1.0)
Monocytes Relative: 7 %
Neutro Abs: 6 10*3/uL (ref 1.7–7.7)
Neutrophils Relative %: 68 %
Platelets: 410 10*3/uL — ABNORMAL HIGH (ref 150–400)
RBC: 5.27 MIL/uL (ref 4.22–5.81)
RDW: 15.3 % (ref 11.5–15.5)
WBC: 8.8 10*3/uL (ref 4.0–10.5)
nRBC: 0 % (ref 0.0–0.2)

## 2020-05-25 LAB — SALICYLATE LEVEL: Salicylate Lvl: <7 mg/dL — ABNORMAL LOW (ref 7.0–30.0)

## 2020-05-25 LAB — ACETAMINOPHEN LEVEL: Acetaminophen (Tylenol), Serum: 15 ug/mL (ref 10–30)

## 2020-05-25 LAB — ETHANOL: Alcohol, Ethyl (B): 339 mg/dL (ref <10)

## 2020-05-25 MED ORDER — HYDROCODONE-ACETAMINOPHEN 5-325 MG PO TABS: 1.0000 | ORAL_TABLET | Freq: Four times a day (QID) | ORAL | 0 refills | Status: DC | PRN

## 2020-05-25 MED ORDER — KETOROLAC TROMETHAMINE 30 MG/ML IJ SOLN
30.0000 mg | Freq: Once | INTRAMUSCULAR | Status: AC
Start: 2020-05-25 — End: 2020-05-25
  Administered 2020-05-25: 30 mg via INTRAVENOUS
  Filled 2020-05-25: qty 1

## 2020-05-25 MED ORDER — TRAMADOL HCL 50 MG PO TABS
50.0000 mg | ORAL_TABLET | Freq: Once | ORAL | Status: AC
  Administered 2020-05-25: 50 mg via ORAL
  Filled 2020-05-25: qty 1

## 2020-05-25 MED ORDER — HYDROCODONE-ACETAMINOPHEN 5-325 MG PO TABS
2.0000 | ORAL_TABLET | Freq: Once | ORAL | Status: AC
  Administered 2020-05-25: 2 via ORAL
  Filled 2020-05-25: qty 2

## 2020-05-25 NOTE — ED Triage Notes (Signed)
The patient was found in front of his home sleeping in the front seat of his car. He reported to EMS that he had 2 5ths of liquor which has been his daily normal for about 3 years now. He also told EMS that he wanted [place] to just let him "die already." EMS noted some intermittent confusion. Hx chronic back pain  EMS vitals: 78 CBG 96% SPO2  180/110 BP 120's HR
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
## 2020-05-25 NOTE — ED Notes (Signed)
CRITICAL VALUE ALERT  Critical Value:  Alcohol 339  Date & Time Notied:  05/25/20    2221  Provider Notified: Delo MD  Orders Received/Actions taken: None at this time

## 2020-05-25 NOTE — Discharge Instructions (Addendum)
Take hydrocodone as prescribed as needed for pain.  Do not mix this medication with alcohol as the effects are additive and could potentially cause respiratory depression and even death.  Follow-up with your spine surgeon in the next week for a recheck of your compression fracture.  Return to the ER if symptoms significantly worsen or change.

## 2020-05-26 ENCOUNTER — Emergency Department (HOSPITAL_COMMUNITY)
Admission: EM | Admit: 2020-05-26 | Discharge: 2020-05-26 | Discharge status: Against Medical Advice | Payer: Medicare Other

## 2020-05-26 NOTE — ED Notes (Signed)
Pt requesting to check in for lower back pain. Pt informed he just discharged and had prescriptions. Pt sts he doesn't have a ride home. Pt does not want to be checked in or triaged again.

## 2020-11-21 ENCOUNTER — Inpatient Hospital Stay (HOSPITAL_COMMUNITY)
Admission: EM | Admit: 2020-11-21 | Discharge: 2020-11-24 | DRG: 641 | Disposition: A | Discharge status: Skilled Nursing Facility | Payer: Medicare Other | Attending: Internal Medicine | Admitting: Internal Medicine

## 2020-11-21 ENCOUNTER — Encounter (HOSPITAL_COMMUNITY): Payer: Self-pay | Admitting: Emergency Medicine

## 2020-11-21 DIAGNOSIS — F17210 Nicotine dependence, cigarettes, uncomplicated: Secondary | ICD-10-CM | POA: Diagnosis present

## 2020-11-21 DIAGNOSIS — K219 Gastro-esophageal reflux disease without esophagitis: Secondary | ICD-10-CM | POA: Diagnosis present

## 2020-11-21 DIAGNOSIS — Z823 Family history of stroke: Secondary | ICD-10-CM

## 2020-11-21 DIAGNOSIS — Z66 Do not resuscitate: Secondary | ICD-10-CM | POA: Diagnosis present

## 2020-11-21 DIAGNOSIS — E861 Hypovolemia: Secondary | ICD-10-CM | POA: Diagnosis present

## 2020-11-21 DIAGNOSIS — K589 Irritable bowel syndrome without diarrhea: Secondary | ICD-10-CM | POA: Diagnosis present

## 2020-11-21 DIAGNOSIS — G43909 Migraine, unspecified, not intractable, without status migrainosus: Secondary | ICD-10-CM | POA: Diagnosis present

## 2020-11-21 DIAGNOSIS — J302 Other seasonal allergic rhinitis: Secondary | ICD-10-CM | POA: Diagnosis present

## 2020-11-21 DIAGNOSIS — I10 Essential (primary) hypertension: Secondary | ICD-10-CM | POA: Diagnosis present

## 2020-11-21 DIAGNOSIS — G8929 Other chronic pain: Secondary | ICD-10-CM | POA: Diagnosis present

## 2020-11-21 DIAGNOSIS — M549 Dorsalgia, unspecified: Secondary | ICD-10-CM | POA: Diagnosis present

## 2020-11-21 DIAGNOSIS — I959 Hypotension, unspecified: Secondary | ICD-10-CM | POA: Diagnosis present

## 2020-11-21 DIAGNOSIS — F1020 Alcohol dependence, uncomplicated: Secondary | ICD-10-CM | POA: Diagnosis present

## 2020-11-21 DIAGNOSIS — Z87440 Personal history of urinary (tract) infections: Secondary | ICD-10-CM | POA: Diagnosis not present

## 2020-11-21 DIAGNOSIS — F17200 Nicotine dependence, unspecified, uncomplicated: Secondary | ICD-10-CM | POA: Diagnosis present

## 2020-11-21 DIAGNOSIS — N179 Acute kidney failure, unspecified: Secondary | ICD-10-CM | POA: Diagnosis present

## 2020-11-21 DIAGNOSIS — F1021 Alcohol dependence, in remission: Secondary | ICD-10-CM

## 2020-11-21 DIAGNOSIS — M4856XA Collapsed vertebra, not elsewhere classified, lumbar region, initial encounter for fracture: Secondary | ICD-10-CM | POA: Diagnosis present

## 2020-11-21 DIAGNOSIS — E876 Hypokalemia: Secondary | ICD-10-CM | POA: Diagnosis present

## 2020-11-21 DIAGNOSIS — E871 Hypo-osmolality and hyponatremia: Secondary | ICD-10-CM

## 2020-11-21 DIAGNOSIS — I480 Paroxysmal atrial fibrillation: Secondary | ICD-10-CM | POA: Diagnosis present

## 2020-11-21 DIAGNOSIS — Z20822 Contact with and (suspected) exposure to covid-19: Secondary | ICD-10-CM | POA: Diagnosis present

## 2020-11-21 DIAGNOSIS — J449 Chronic obstructive pulmonary disease, unspecified: Secondary | ICD-10-CM | POA: Diagnosis present

## 2020-11-21 DIAGNOSIS — R011 Cardiac murmur, unspecified: Secondary | ICD-10-CM | POA: Diagnosis present

## 2020-11-21 DIAGNOSIS — E860 Dehydration: Secondary | ICD-10-CM | POA: Diagnosis not present

## 2020-11-21 DIAGNOSIS — M5441 Lumbago with sciatica, right side: Secondary | ICD-10-CM | POA: Diagnosis present

## 2020-11-21 DIAGNOSIS — M5442 Lumbago with sciatica, left side: Secondary | ICD-10-CM | POA: Diagnosis present

## 2020-11-21 DIAGNOSIS — I4891 Unspecified atrial fibrillation: Secondary | ICD-10-CM | POA: Diagnosis present

## 2020-11-21 LAB — CBC WITH DIFFERENTIAL/PLATELET
Abs Immature Granulocytes: 0.22 10*3/uL — ABNORMAL HIGH (ref 0.00–0.07)
Basophils Absolute: 0.1 10*3/uL (ref 0.0–0.1)
Basophils Relative: 0 %
Eosinophils Absolute: 0.1 10*3/uL (ref 0.0–0.5)
Eosinophils Relative: 0 %
HCT: 43.3 % (ref 39.0–52.0)
Hemoglobin: 16.1 g/dL (ref 13.0–17.0)
Immature Granulocytes: 2 %
Lymphocytes Relative: 8 %
Lymphs Abs: 1.1 10*3/uL (ref 0.7–4.0)
MCH: 32.9 pg (ref 26.0–34.0)
MCHC: 37.2 g/dL — ABNORMAL HIGH (ref 30.0–36.0)
MCV: 88.5 fL (ref 80.0–100.0)
Monocytes Absolute: 1.9 10*3/uL — ABNORMAL HIGH (ref 0.1–1.0)
Monocytes Relative: 14 %
Neutro Abs: 10.2 10*3/uL — ABNORMAL HIGH (ref 1.7–7.7)
Neutrophils Relative %: 76 %
Platelets: 443 10*3/uL — ABNORMAL HIGH (ref 150–400)
RBC: 4.89 MIL/uL (ref 4.22–5.81)
RDW: 11.7 % (ref 11.5–15.5)
WBC: 13.6 10*3/uL — ABNORMAL HIGH (ref 4.0–10.5)
nRBC: 0 % (ref 0.0–0.2)

## 2020-11-21 LAB — RAPID URINE DRUG SCREEN, HOSP PERFORMED
Amphetamines: NOT DETECTED
Barbiturates: NOT DETECTED
Benzodiazepines: NOT DETECTED
Cocaine: NOT DETECTED
Opiates: NOT DETECTED
Tetrahydrocannabinol: NOT DETECTED

## 2020-11-21 LAB — COMPREHENSIVE METABOLIC PANEL
ALT: 26 U/L (ref 0–44)
AST: 39 U/L (ref 15–41)
Albumin: 4.1 g/dL (ref 3.5–5.0)
Alkaline Phosphatase: 47 U/L (ref 38–126)
Anion gap: 16 — ABNORMAL HIGH (ref 5–15)
BUN: 50 mg/dL — ABNORMAL HIGH (ref 8–23)
CO2: 23 mmol/L (ref 22–32)
Calcium: 9.5 mg/dL (ref 8.9–10.3)
Chloride: 75 mmol/L — ABNORMAL LOW (ref 98–111)
Creatinine, Ser: 2.8 mg/dL — ABNORMAL HIGH (ref 0.61–1.24)
GFR, Estimated: 24 mL/min — ABNORMAL LOW (ref >60)
Glucose, Bld: 103 mg/dL — ABNORMAL HIGH (ref 70–99)
Potassium: 4 mmol/L (ref 3.5–5.1)
Sodium: 114 mmol/L — CL (ref 135–145)
Total Bilirubin: 2.1 mg/dL — ABNORMAL HIGH (ref 0.3–1.2)
Total Protein: 6.7 g/dL (ref 6.5–8.1)

## 2020-11-21 LAB — BASIC METABOLIC PANEL
Anion gap: 12 (ref 5–15)
Anion gap: 11 (ref 5–15)
BUN: 50 mg/dL — ABNORMAL HIGH (ref 8–23)
BUN: 43 mg/dL — ABNORMAL HIGH (ref 8–23)
CO2: 20 mmol/L — ABNORMAL LOW (ref 22–32)
CO2: 20 mmol/L — ABNORMAL LOW (ref 22–32)
Calcium: 8 mg/dL — ABNORMAL LOW (ref 8.9–10.3)
Calcium: 8.2 mg/dL — ABNORMAL LOW (ref 8.9–10.3)
Chloride: 84 mmol/L — ABNORMAL LOW (ref 98–111)
Chloride: 89 mmol/L — ABNORMAL LOW (ref 98–111)
Creatinine, Ser: 2.45 mg/dL — ABNORMAL HIGH (ref 0.61–1.24)
Creatinine, Ser: 1.89 mg/dL — ABNORMAL HIGH (ref 0.61–1.24)
GFR, Estimated: 28 mL/min — ABNORMAL LOW (ref >60)
GFR, Estimated: 38 mL/min — ABNORMAL LOW (ref >60)
Glucose, Bld: 81 mg/dL (ref 70–99)
Glucose, Bld: 79 mg/dL (ref 70–99)
Potassium: 3.8 mmol/L (ref 3.5–5.1)
Potassium: 3.4 mmol/L — ABNORMAL LOW (ref 3.5–5.1)
Sodium: 116 mmol/L — CL (ref 135–145)
Sodium: 120 mmol/L — ABNORMAL LOW (ref 135–145)

## 2020-11-21 LAB — URINALYSIS, ROUTINE W REFLEX MICROSCOPIC
Bilirubin Urine: NEGATIVE
Glucose, UA: NEGATIVE mg/dL
Hgb urine dipstick: NEGATIVE
Ketones, ur: 5 mg/dL — AB
Leukocytes,Ua: NEGATIVE
Nitrite: NEGATIVE
Protein, ur: NEGATIVE mg/dL
Specific Gravity, Urine: 1.011 (ref 1.005–1.030)
pH: 5 (ref 5.0–8.0)

## 2020-11-21 LAB — LACTIC ACID, PLASMA: Lactic Acid, Venous: 1.5 mmol/L (ref 0.5–1.9)

## 2020-11-21 LAB — HIV ANTIBODY (ROUTINE TESTING W REFLEX): HIV Screen 4th Generation wRfx: NONREACTIVE

## 2020-11-21 LAB — SARS CORONAVIRUS 2 BY RT PCR (HOSPITAL ORDER, PERFORMED IN ~~LOC~~ HOSPITAL LAB): SARS Coronavirus 2: NEGATIVE

## 2020-11-21 LAB — ETHANOL: Alcohol, Ethyl (B): <10 mg/dL (ref <10)

## 2020-11-21 MED ORDER — FENTANYL CITRATE (PF) 100 MCG/2ML IJ SOLN
50.0000 ug | Freq: Once | INTRAMUSCULAR | Status: AC
Start: 2020-11-21 — End: 2020-11-21
  Administered 2020-11-21: 50 ug via INTRAVENOUS
  Filled 2020-11-21: qty 2

## 2020-11-21 MED ORDER — HYDRALAZINE HCL 20 MG/ML IJ SOLN: 5.0000 mg | INTRAMUSCULAR | Status: DC | PRN

## 2020-11-21 MED ORDER — LORAZEPAM 1 MG PO TABS: 1.0000 mg | ORAL_TABLET | ORAL | Status: DC | PRN

## 2020-11-21 MED ORDER — SODIUM CHLORIDE 0.9 % IV BOLUS
1000.0000 mL | Freq: Once | INTRAVENOUS | Status: AC
  Administered 2020-11-21: 1000 mL via INTRAVENOUS

## 2020-11-21 MED ORDER — ONDANSETRON HCL 4 MG/2ML IJ SOLN: 4.0000 mg | Freq: Four times a day (QID) | INTRAMUSCULAR | Status: DC | PRN

## 2020-11-21 MED ORDER — THIAMINE HCL 100 MG/ML IJ SOLN
100.0000 mg | Freq: Every day | INTRAMUSCULAR | Status: DC
  Administered 2020-11-22: 100 mg via INTRAVENOUS
  Filled 2020-11-21: qty 2

## 2020-11-21 MED ORDER — LIDOCAINE 5 % EX PTCH
3.0000 | MEDICATED_PATCH | CUTANEOUS | Status: DC
  Administered 2020-11-21 – 2020-11-23 (×3): 3 via TRANSDERMAL
  Filled 2020-11-21 (×3): qty 3

## 2020-11-21 MED ORDER — ACETAMINOPHEN 325 MG PO TABS
650.0000 mg | ORAL_TABLET | Freq: Once | ORAL | Status: AC
  Administered 2020-11-21: 650 mg via ORAL
  Filled 2020-11-21: qty 2

## 2020-11-21 MED ORDER — FLUTICASONE FUROATE-VILANTEROL 200-25 MCG/INH IN AEPB: 1.0000 | INHALATION_SPRAY | Freq: Every day | RESPIRATORY_TRACT | Status: DC

## 2020-11-21 MED ORDER — THIAMINE HCL 100 MG PO TABS
100.0000 mg | ORAL_TABLET | Freq: Every day | ORAL | Status: DC
  Administered 2020-11-21 – 2020-11-24 (×3): 100 mg via ORAL
  Filled 2020-11-21 (×4): qty 1

## 2020-11-21 MED ORDER — SODIUM CHLORIDE 0.9% FLUSH
3.0000 mL | Freq: Two times a day (BID) | INTRAVENOUS | Status: DC
  Administered 2020-11-23 – 2020-11-24 (×3): 3 mL via INTRAVENOUS

## 2020-11-21 MED ORDER — ACETAMINOPHEN 650 MG RE SUPP: 650.0000 mg | Freq: Four times a day (QID) | RECTAL | Status: DC | PRN

## 2020-11-21 MED ORDER — ALBUTEROL SULFATE (2.5 MG/3ML) 0.083% IN NEBU: 3.0000 mL | INHALATION_SOLUTION | RESPIRATORY_TRACT | Status: DC | PRN

## 2020-11-21 MED ORDER — ENOXAPARIN SODIUM 30 MG/0.3ML ~~LOC~~ SOLN
30.0000 mg | SUBCUTANEOUS | Status: DC
  Administered 2020-11-22: 30 mg via SUBCUTANEOUS
  Filled 2020-11-21: qty 0.3

## 2020-11-21 MED ORDER — BISACODYL 5 MG PO TBEC: 5.0000 mg | DELAYED_RELEASE_TABLET | Freq: Every day | ORAL | Status: DC | PRN

## 2020-11-21 MED ORDER — MORPHINE SULFATE (PF) 2 MG/ML IV SOLN: 2.0000 mg | INTRAVENOUS | Status: DC | PRN

## 2020-11-21 MED ORDER — ACETAMINOPHEN 325 MG PO TABS
650.0000 mg | ORAL_TABLET | Freq: Four times a day (QID) | ORAL | Status: DC | PRN
  Administered 2020-11-22 – 2020-11-24 (×5): 650 mg via ORAL
  Filled 2020-11-21 (×4): qty 2

## 2020-11-21 MED ORDER — POLYETHYLENE GLYCOL 3350 17 G PO PACK: 17.0000 g | PACK | Freq: Every day | ORAL | Status: DC | PRN

## 2020-11-21 MED ORDER — SODIUM CHLORIDE 0.9 % IV BOLUS
1000.0000 mL | Freq: Once | INTRAVENOUS | Status: AC
Start: 2020-11-21 — End: 2020-11-21
  Administered 2020-11-21: 1000 mL via INTRAVENOUS

## 2020-11-21 MED ORDER — DOCUSATE SODIUM 100 MG PO CAPS: 100.0000 mg | ORAL_CAPSULE | Freq: Two times a day (BID) | ORAL | Status: DC | PRN

## 2020-11-21 MED ORDER — ONDANSETRON HCL 4 MG PO TABS: 4.0000 mg | ORAL_TABLET | Freq: Four times a day (QID) | ORAL | Status: DC | PRN

## 2020-11-21 MED ORDER — SODIUM CHLORIDE 0.9 % IV SOLN: INTRAVENOUS | Status: AC

## 2020-11-21 MED ORDER — ADULT MULTIVITAMIN W/MINERALS CH
1.0000 | ORAL_TABLET | Freq: Every day | ORAL | Status: DC
  Administered 2020-11-21 – 2020-11-24 (×4): 1 via ORAL
  Filled 2020-11-21 (×4): qty 1

## 2020-11-21 MED ORDER — HYDROCODONE-ACETAMINOPHEN 5-325 MG PO TABS: 1.0000 | ORAL_TABLET | Freq: Four times a day (QID) | ORAL | Status: DC | PRN

## 2020-11-21 MED ORDER — FOLIC ACID 1 MG PO TABS
1.0000 mg | ORAL_TABLET | Freq: Every day | ORAL | Status: DC
  Administered 2020-11-21 – 2020-11-24 (×4): 1 mg via ORAL
  Filled 2020-11-21 (×4): qty 1

## 2020-11-21 MED ORDER — DOCUSATE SODIUM 100 MG PO CAPS
100.0000 mg | ORAL_CAPSULE | Freq: Two times a day (BID) | ORAL | Status: DC
  Administered 2020-11-22 – 2020-11-24 (×5): 100 mg via ORAL
  Filled 2020-11-21 (×5): qty 1

## 2020-11-21 MED ORDER — NICOTINE 21 MG/24HR TD PT24
21.0000 mg | MEDICATED_PATCH | Freq: Every day | TRANSDERMAL | Status: DC
  Administered 2020-11-21 – 2020-11-24 (×4): 21 mg via TRANSDERMAL
  Filled 2020-11-21 (×4): qty 1

## 2020-11-21 MED ORDER — LORAZEPAM 2 MG/ML IJ SOLN: 1.0000 mg | INTRAMUSCULAR | Status: DC | PRN

## 2020-11-21 NOTE — ED Notes (Addendum)
Pt able to move all extremities. Pt denies numbness and tingling. Pt transferred from [facility] to bed. Pt denies loss of bowel or bladder

## 2020-11-21 NOTE — H&P (Signed)
NAME:  Alvin Benton, MRN:  [id number], DOB:  1951/10/05, LOS: 0 ADMISSION DATE:  11/21/2020, CONSULTATION DATE:  1/25 REFERRING MD:  Dr. Tanna Savoy, CHIEF COMPLAINT:  Hyponatremia   Brief History:  70 year old male with hyponatremia and a CNS symptoms admitted 1/25.   History of Present Illness:  70 year old male with PMH as below, which is significant for COPD, AF not on AC, ETOH abuse (no use for the past two weeks). He has a long history of back and lower extremity pain due to a back injury, and struggles to get around at baseline because of this pain. For the past two weeks his immobility has been quite a bit worse, which is honestly the reason he was forced to stop drinking. He denies having withdrawal symptoms. Since that time he has had quite poor PO intake as well as nausea and vomiting. Tells me he hasn't eaten for one week. He decided to call EMS on 1/25 when he was unable to ambulate at all. Upon arrival to the ED he was hypotensive and was started on IVF. Laboratory evaluation significant for NA 114, Creatinine 2.8, and WBC 13.6. Blood pressure improved with IVF. Despite 2 L IVF the patient continued to have waxing and waning mental status and PCCM was asked to admit for ICU monitoring.   Past Medical History:   has a past medical history of Atrial fibrillation (HCC) (06/2017), Blood in stool, Diverticulitis, ETOH abuse, GERD (gastroesophageal reflux disease), Heart murmur, History of urinary tract infection, Hypertension, IBS (irritable bowel syndrome), Migraine, Nocturia, Pneumonia, Seasonal allergies, and Tobacco abuse.  Significant Hospital Events:  1/25 admit  Consults:    Procedures:    Significant Diagnostic Tests:    Micro Data:    Antimicrobials:     Interim History / Subjective:    Objective   Blood pressure (!) 110/48, pulse 63, temperature 97.7 F (36.5 C), resp. rate 16, height 5\' 9"  (1.753 m), weight 84.8 kg, SpO2 99 %.        Intake/Output Summary  (Last 24 hours) at 11/21/2020 1655 Last data filed at 11/21/2020 1535 Gross per 24 hour  Intake 2000 ml  Output --  Net 2000 ml   Filed Weights   11/21/20 1049  Weight: 84.8 kg    Examination: General: elderly appearing male in NAD HENT: [place]/AT, PERRL, no JVD. MM dry Lungs: Clear bilateral breath sounds Cardiovascular: RRR, 3/6 SEM Abdomen: Soft, non-tender, non-distended Extremities: No acute deformity. Multiple bruises on bilateral upper extremities.  Neuro: Alert, oriented, non-focal. Somewhat confused to recent events.   Resolved Hospital Problem list     Assessment & Plan:   Hypovolemic hyponatremia: Likely secondary to poor PO intake and vomiting. Beer potomania is a potential cause, but her reports no alcohol for 2 weeks and ETOH is negative on presentation.  - Admit to PCU - Is s/p 2.4L NS, will await repeat BMP - If improving with saline, will continue, if not will start hypertonic.  - BMP q 4 hours, goal of 0.5 mmol/L NA correction per hour - Strict I&O. - Heart healthy diet  AKI: dehydration - IVF resuscitation as above - Follow chemistries - If does not correct will need further workup with renal 11/23/20    Hypertension - holding home lisinopril/HCTZ  COPD - PRN albuterol per home regimen  ETOH abuse: at least a 6 pack per day (no drinks since approximately 1/11). Should be out of withdrawal window if his reports are accurate.  - Substance  abuse counseling - CIWA protocol is reasonable - Thaimine, folic acid, MVI  PAF: not on AC. Sinus in ED.  - telemetry monitoring.   Best practice (evaluated daily)  Diet: Heart healthy Pain/Anxiety/Delirium protocol (if indicated): NA VAP protocol (if indicated): NA DVT prophylaxis: SQH GI prophylaxis: PPI Glucose control: NA Mobility: Bedrest Disposition: Admit: PCU  Goals of Care:  Last date of multidisciplinary goals of care discussion:  Family and staff present:  Summary of discussion:  Follow up goals of  care discussion due: 2/1 Code Status: DNR  Labs   CBC: Recent Labs  Lab 11/21/20 1057  WBC 13.6*  NEUTROABS 10.2*  HGB 16.1  HCT 43.3  MCV 88.5  PLT 443*    Basic Metabolic Panel: Recent Labs  Lab 11/21/20 1057  NA 114*  K 4.0  CL 75*  CO2 23  GLUCOSE 103*  BUN 50*  CREATININE 2.80*  CALCIUM 9.5   GFR: Estimated Creatinine Clearance: 25.6 mL/min (A) (by C-G formula based on SCr of 2.8 mg/dL (H)). Recent Labs  Lab 11/21/20 1057 11/21/20 1220  WBC 13.6*  --   LATICACIDVEN  --  1.5    Liver Function Tests: Recent Labs  Lab 11/21/20 1057  AST 39  ALT 26  ALKPHOS 47  BILITOT 2.1*  PROT 6.7  ALBUMIN 4.1   No results for input(s): LIPASE, AMYLASE in the last 168 hours. No results for input(s): AMMONIA in the last 168 hours.  ABG    Component Value Date/Time   PHART 7.427 07/22/2017 1159   PCO2ART 35.3 07/22/2017 1159   PO2ART 82.0 (L) 07/22/2017 1159   HCO3 23.3 07/22/2017 1159   HCO3 24.5 07/22/2017 1159   TCO2 24 07/22/2017 1159   TCO2 26 07/22/2017 1159   ACIDBASEDEF 1.0 07/22/2017 1159   O2SAT 96.0 07/22/2017 1159   O2SAT 61.0 07/22/2017 1159     Coagulation Profile: No results for input(s): INR, PROTIME in the last 168 hours.  Cardiac Enzymes: No results for input(s): CKTOTAL, CKMB, CKMBINDEX, TROPONINI in the last 168 hours.  HbA1C: Hgb A1c MFr Bld  Date/Time Value Ref Range Status  08/27/2016 02:00 PM 4.4 (L) 4.8 - 5.6 % Final    Comment:    (NOTE)         Pre-diabetes: 5.7 - 6.4         Diabetes: >6.4         Glycemic control for adults with diabetes: <7.0     CBG: No results for input(s): GLUCAP in the last 168 hours.  Review of Systems:    Bolds are positive  Constitutional: weight loss, gain, night sweats, Fevers, chills, fatigue .  HEENT: headaches, Sore throat, sneezing, nasal congestion, post nasal drip, Difficulty swallowing, Tooth/dental problems, visual complaints visual changes, ear ache CV:  chest pain,  radiates:,Orthopnea, PND, swelling in lower extremities, dizziness, palpitations, syncope.  GI  heartburn, indigestion, abdominal pain, nausea, vomiting, diarrhea, change in bowel habits, loss of appetite, bloody stools.  Resp: cough, productive:, hemoptysis, dyspnea, chest pain, pleuritic.  Skin: rash or itching or icterus GU: dysuria, change in color of urine, urgency or frequency. flank pain, hematuria  MS: back and lower pain chronic or swelling. decreased range of motion  Psych: change in mood or affect. depression or anxiety.  Neuro: difficulty with speech, weakness, numbness, ataxia    Past Medical History:  He,  has a past medical history of Atrial fibrillation (HCC) (06/2017), Blood in stool, Diverticulitis, ETOH abuse, GERD (gastroesophageal reflux disease), Heart  murmur, History of urinary tract infection, Hypertension, IBS (irritable bowel syndrome), Migraine, Nocturia, Pneumonia, Seasonal allergies, and Tobacco abuse.   Surgical History:   Past Surgical History:  Procedure Laterality Date  . COLONOSCOPY    . CYSTOSCOPY WITH STENT PLACEMENT Left 10/03/2016   Procedure: CYSTOSCOPY WITH  LEFT URETERAL CATHETER PLACEMENT;  Surgeon: Bjorn Pippin, MD;  Location: [facility];  Service: Urology;  Laterality: Left;  . PROCTOSCOPY  10/03/2016   Procedure: PROCTOSCOPY;  Surgeon: Romie Levee, MD;  Location: [facility];  Service: General;;  . RIGHT/LEFT HEART CATH AND CORONARY ANGIOGRAPHY N/A 07/22/2017   Procedure: RIGHT/LEFT HEART CATH AND CORONARY ANGIOGRAPHY;  Surgeon: Swaziland, Peter M, MD;  Location: [facility] INVASIVE CV LAB;  Service: Cardiovascular;  Laterality: N/A;  . TONSILLECTOMY       Social History:   reports that he has been smoking cigarettes. He has a 40.00 pack-year smoking history. He has never used smokeless tobacco. He reports current alcohol use of about 3.0 standard drinks of alcohol per week. He reports that he does not use drugs.   Family History:  His family history includes  Stroke in his mother. There is no history of Colon cancer, Esophageal cancer, Rectal cancer, or Stomach cancer.   Allergies No Known Allergies      Joneen Roach, AGACNP-BC [facility]  See Amion for personal pager PCCM on call pager [phone]  11/21/2020 4:56 PM

## 2020-11-21 NOTE — H&P (Signed)
History and Physical    Alvin Benton ZOX:[id number] DOB: 1951-05-18 DOA: 11/21/2020  PCP: Irving Copas Consultants:  Cherly Hensen - vascular surgery Patient coming from:  Home - lives alone ; NOK: Jodi Geralds, [phone]  Chief Complaint: Back pain  HPI: Alvin Benton is a 70 y.o. male with medical history significant of HTN; PAD; ETOH dependence; afib; and chronic back pain presenting with worsening back pain, difficulty ambulating.  He reports that he has chronic back pain associated with lumbar spine fractures dating back to July 2021.  The pain was uncontrolled and so he decided to come in.  He also has a h/o ETOH dependence and apparently decided to stop drinking a week or more again and did experience withdrawals.  He has not been eating/drinking because he "didn't have access" to food or drink.  He also mistook his pain medications and ran out and has had no pain control.  The history was somewhat rambling and incoherent because the patient was very somnolent and required prodding periodically for him to wake up and answer questions.   ED Course:  AKI due to dehydration. LBP, diarrhea, loss of appetite.  No red flags.  Given 2L IVF with improved hemostatics.  Creatinine 2.8.  Chronic back pain without red flags, no urinary retention, recommend outpatient management.  Review of Systems: As per HPI; otherwise review of systems reviewed and negative.  Limited by somnolence   COVID Vaccine Status:  Unknown  Past Medical History:  Diagnosis Date  . Atrial fibrillation (HCC) 06/2017  . Blood in stool   . Diverticulitis   . ETOH abuse   . GERD (gastroesophageal reflux disease)   . Heart murmur   . History of urinary tract infection   . Hypertension   . IBS (irritable bowel syndrome)   . Migraine    "none in a long time" (03/14/2016)  . Nocturia   . Pneumonia    childhood  . Seasonal allergies   . Tobacco abuse     Past Surgical History:  Procedure Laterality Date  .  COLONOSCOPY    . CYSTOSCOPY WITH STENT PLACEMENT Left 10/03/2016   Procedure: CYSTOSCOPY WITH  LEFT URETERAL CATHETER PLACEMENT;  Surgeon: Bjorn Pippin, MD;  Location: [facility];  Service: Urology;  Laterality: Left;  . PROCTOSCOPY  10/03/2016   Procedure: PROCTOSCOPY;  Surgeon: Romie Levee, MD;  Location: [facility];  Service: General;;  . RIGHT/LEFT HEART CATH AND CORONARY ANGIOGRAPHY N/A 07/22/2017   Procedure: RIGHT/LEFT HEART CATH AND CORONARY ANGIOGRAPHY;  Surgeon: Swaziland, Peter M, MD;  Location[facility] INVASIVE CV LAB;  Service: Cardiovascular;  Laterality: N/A;  . TONSILLECTOMY      Social History   Socioeconomic History  . Marital status: Single    Spouse name: Not on file  . Number of children: 1  . Years of education: Not on file  . Highest education level: Not on file  Occupational History  . Occupation: Inventory     Employer: [employer]  Tobacco Use  . Smoking status: Current Every Day Smoker    Packs/day: 1.00    Years: 40.00    Pack years: 40.00    Types: Cigarettes  . Smokeless tobacco: Never Used  Vaping Use  . Vaping Use: Never used  Substance and Sexual Activity  . Alcohol use: Yes    Alcohol/week: 3.0 standard drinks    Types: 3 Shots of liquor per week    Comment: pt states does not drink daily has beer with football  .  Drug use: No  . Sexual activity: Not Currently  Other Topics Concern  . Not on file  Social History Narrative   Area and one daughter   Regulatory affairs officernventory analyst at [employer]   One caffeinated beverage a day   Social Determinants of Health   Financial Resource Strain: Not on file  Food Insecurity: Not on file  Transportation Needs: Not on file  Physical Activity: Not on file  Stress: Not on file  Social Connections: Not on file  Intimate Partner Violence: Not on file    No Known Allergies  Family History  Problem Relation Age of Onset  . Stroke Mother   . Colon cancer Neg Hx   . Esophageal cancer Neg Hx   . Rectal cancer  Neg Hx   . Stomach cancer Neg Hx     Prior to Admission medications   Medication Sig Start Date End Date Taking? Authorizing Provider  Albuterol Sulfate 108 (90 Base) MCG/ACT AEPB Inhale 2 puffs into the lungs every 4 (four) hours as needed for shortness of breath. 02/11/17   [provider]  chlordiazePOXIDE (LIBRIUM) 25 MG capsule 50mg  PO TID x 1D, then 25-50mg  PO BID X 1D, then 25-50mg  PO QD X 1D Patient not taking: Reported on 05/25/2020 02/19/18   Phillis HaggisMabe, Martha L, MD  feeding supplement, ENSURE ENLIVE, (ENSURE ENLIVE) LIQD Take 237 mLs by mouth 2 (two) times daily between meals. Patient not taking: Reported on 02/19/2018 01/07/18   Penny PiaVega, Orlando, MD  folic acid (FOLVITE) 1 MG tablet Take 1 tablet (1 mg total) by mouth daily. Patient not taking: Reported on 02/19/2018 01/08/18   Penny PiaVega, Orlando, MD  hydrochlorothiazide (HYDRODIURIL) 25 MG tablet Take 25 mg by mouth every morning. 02/28/20   [provider]  HYDROcodone-acetaminophen (NORCO) 5-325 MG tablet Take 1-2 tablets by mouth every 6 (six) hours as needed. 05/25/20   Geoffery Lyonselo, Douglas, MD  lactulose (CHRONULAC) 10 GM/15ML solution Take 30 mLs (20 g total) by mouth 2 (two) times daily. Patient not taking: Reported on 02/19/2018 01/07/18   Penny PiaVega, Orlando, MD  metoprolol tartrate (LOPRESSOR) 25 MG tablet Take 1 tablet (25 mg total) by mouth 2 (two) times daily. Patient not taking: Reported on 05/25/2020 07/22/17 08/21/17  Arrien, York RamMauricio Daniel, MD  pantoprazole (PROTONIX) 40 MG tablet Take 1 tablet (40 mg total) by mouth daily at 6 (six) AM. Patient not taking: Reported on 02/19/2018 01/08/18   Penny PiaVega, Orlando, MD    Physical Exam: Vitals:   11/21/20 1829 11/21/20 1830 11/21/20 1845 11/21/20 1900  BP:  118/63 (!) 113/59 114/63  Pulse:  69 66 69  Resp:  17 16 13   Temp: 97.8 F (36.6 C)     TempSrc: Oral     SpO2:  97% 98% 95%  Weight:      Height:         . General:  Appears somnolent . Eyes:  PERRL, EOMI, normal lids,  iris . ENT:  grossly normal hearing, lips & tongue, mmm . Neck:  no LAD, masses or thyromegaly . Cardiovascular:  RRR, no m/r/g. No LE edema.  Marland Kitchen. Respiratory:   CTA bilaterally with no wheezes/rales/rhonchi.  Normal respiratory effort. . Abdomen:  soft, NT, ND, NABS . Skin:  no rash or induration seen on limited exam . Musculoskeletal:  grossly normal tone BUE/BLE, good ROM, no bony abnormality . Psychiatric: somnolent mood and affect, speech fluent and appropriate, AOx3 . Neurologic:  CN 2-12 grossly intact, moves all extremities in coordinated fashion  Radiological Exams on Admission: Independently reviewed - see discussion in A/P where applicable  No results found.  EKG: Independently reviewed.  NSR with rate 67; nonspecific ST changes with no evidence of acute ischemia   Labs on Admission: I have personally reviewed the available labs and imaging studies at the time of the admission.  Pertinent labs:   Na++ 114, down from 142 BUN 50/Creatinine 2.80/GFR 24; 10/0.72/>60 on 7/29 Anion gap 16 Bili 2.1 Lactate 1.5 WBC 13.6   Assessment/Plan Principal Problem:   Dehydration with hyponatremia Active Problems:   Tobacco use disorder   Atrial fibrillation (HCC)   Essential hypertension   Alcohol dependence in early full remission (HCC)   Chronic back pain greater than 3 months duration   AKI (acute kidney injury) (HCC)   Severe hypovolemic hyponatremia  -Patient with chronic back pain - ran out of pain meds and stopped drinking simultaneously and also didn't have access to food and drink (?) -Appears severely dehydrated with profound acute hyponatremia -Repeat BMP shows improvement of Na++ from 114 to 116 -Patient with tenuous BP and yet aggressive hydration may lead to complications -In an effort to prevent overcorrection (>54mEq/L/day so as to avoid central pontine myelinolysis, PCCM has been consulted and has agreed to manage the patient's fluids and electrolytes  overnight - assistance greatly appreciated! -Will continue to follow with q6h BMP -TRH will assume care in AM -Will admit to progressive care  Hypovolemic AKI -As noted above, profound dehydration with AKI -Also takes Zestoretic at home, likely should stop this medication -Already improving with repeat BMP -Continue IVF as per PCCM  Back pain -Lumbar spine compression fractures -Planned for surgery reportedly but delayed due to COVID -PDMP reviewed and patient appears to take controlled substances when he has access to them -Likely needs referral to pain management -For now, tylenol, oxy IR, and morphine -Will add lidocaine patch -PT/OT evaluation  ETOH dependence -Patient with chronic ETOH dependence -Reportedly stopped drinking a week or more ago -As such, he may not be at high risk for complications of withdrawal including seizures, DTs -CIWA protocol -Folate, thiamine, and MVI ordered -Will provide symptom-triggered BZD (ativan per CIWA protocol) only since the patient is able to communicate; is not showing current signs of delirium; and has no history of severe withdrawal. -Will also check UDS. -Consider offering a medication for Alcohol Use Disorder at the time of d/c, to include Disulfuram; Naltrexone; or Acamprosate.  HTN -Hold and consider d/c of Zestoretic -Currently low BP but may need an alternative agent  Afib -Despite h/o afib, he is not on AC (CHA2DS2-VASc score is at least 2 with age and HTN) -He is also not on rate controlling medication -He is currently in NSR  Tobacco dependence -Encourage cessation.   -Patch ordered    Note: This patient has been tested and is negative for the novel coronavirus COVID-19.    Total critical care time: 65 minutes Critical care time was exclusive of separately billable procedures and treating other patients. Critical care was necessary to treat or prevent imminent or life-threatening deterioration. Critical care was  time spent personally by me on the following activities: development of treatment plan with patient and/or surrogate as well as nursing, discussions with consultants, evaluation of patient's response to treatment, examination of patient, obtaining history from patient or surrogate, ordering and performing treatments and interventions, ordering and review of laboratory studies, ordering and review of radiographic studies, pulse oximetry and re-evaluation of patient's condition.   Level of  care: ProgressiveProgressive DVT prophylaxis:  Lovenox  Code Status:  DNR - confirmed with patient Family Communication: None present Disposition Plan:  The patient is from: home  Anticipated d/c is to: be determined  Anticipated d/c date will depend on clinical response to treatment, likely several days Patient is currently: acutely ill Consults called: PCCM; PT/OT Admission status:  Admit - It is my clinical opinion that admission to INPATIENT is reasonable and necessary because of the expectation that this patient will require hospital care that crosses at least 2 midnights to treat this condition based on the medical complexity of the problems presented.  Given the aforementioned information, the predictability of an adverse outcome is felt to be significant.    Jonah Blue MD [facility]   How to contact the [facility] Attending or Consulting provider 7A - 7P or covering provider during after hours 7P -7A, for this patient?  1. Check the care team in [facility] and look for a) attending/consulting TRH provider listed and b) the [facility] team listed 2. Log into [email or web address] and use 's universal password to access. If you do not have the password, please contact the hospital operator. 3. Locate the [facility] provider you are looking for under [facility] and page to a number that you can be directly reached. 4. If you still have difficulty reaching the provider, please page the [facility] (Director on Call)  for the Hospitalists listed on amion for assistance.   11/21/2020, 7:13 PM

## 2020-11-21 NOTE — ED Provider Notes (Signed)
[facility]   CSN: [id number] Arrival date & time: 11/21/20  1040     History Chief Complaint  Patient presents with  . Back Pain  . Hypotension         Alvin Benton is a 70 y.o. male.  HPI   Patient with significant medical history of hypertension, A. fib, COPD, chronic lower back pain, L2 compression fracture presents with chief complaint of lower back pain.  Patient states he had pain for a while but over the last week the pain has progressing gotten worse.  He describes a sharp sensation which he feels in his mid lower back that radiates down both his legs, he occasionally has numbness and tingling in his feet, denies urinary symptoms, urinary retention or incontinency.  Patient does admit that he feels its more difficulty walking on his feet as he states this causes severe pain in his back, he has been seen by neurosurgery who planned on taking him to the OR but it was canceled  due to Covid.  Patient has no history of aortic aneurysm or dissection, and has no connective tissue disorders.  He also notes that he has had some nausea and vomiting for last few days and has had an loss of appetite but denies fevers, chills, chest pain, shortness of breath, recent sick contacts, is vaccine against COVID-19.  Patient denies any alleviating factors.  Past Medical History:  Diagnosis Date  . Atrial fibrillation (HCC) 06/2017  . Blood in stool   . Diverticulitis   . ETOH abuse   . GERD (gastroesophageal reflux disease)   . Heart murmur   . History of urinary tract infection   . Hypertension   . IBS (irritable bowel syndrome)   . Migraine    "none in a long time" (03/14/2016)  . Nocturia   . Pneumonia    childhood  . Seasonal allergies   . Tobacco abuse     Patient Active Problem List   Diagnosis Date Noted  . Acute alcoholic pancreatitis 01/01/2018  . Acute alcoholic hepatitis 01/01/2018  . Elevated troponin   . Nausea &  vomiting 07/21/2017  . Atrial fibrillation with RVR (HCC) 07/21/2017  . Alcoholic ketoacidosis 07/21/2017  . PNA (pneumonia): pROBABLE ASPIRATION 05/21/2017  . Hyponatremia 05/20/2017  . Dehydration 05/20/2017  . Syncope 02/26/2017  . History of atrial fibrillation 02/26/2017  . Right flank hematoma 02/26/2017  . Zygomatic fracture, right side, initial encounter for closed fracture (HCC) 02/26/2017  . Multiple rib fractures 02/26/2017  . Acute pulmonary edema (HCC)   . Dizziness   . Fall   . Dehydration with hyponatremia 02/20/2017  . Alcohol dependence in early full remission (HCC) 06/04/2016  . Volume depletion 06/04/2016  . Hypokalemia 06/04/2016  . Diverticulitis of intestine with perforation without bleeding 06/04/2016  . Essential hypertension   . Hypomagnesemia   . Ileus (HCC) 05/05/2016  . Systolic murmur 05/05/2016  . Anxiety state 04/19/2016  . 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 Hypoxia 04/05/2016  . Atrial fibrillation (HCC) 04/05/2016  . Diverticulitis of large intestine with perforation and abscess 03/14/2016  . Chronic constipation 03/08/2016  . Alcohol abuse 02/15/2016  . Tobacco use disorder 07/17/2015  . Allergic rhinitis 07/17/2015  . Reactive airway disease with wheezing 07/17/2015  . Overweight (BMI 25.0-29.9) 07/17/2015  . Hand pain, left 01/12/2014  . LLQ pain-chronic, intermittent and episodic 09/17/2013    Past Surgical History:  Procedure Laterality Date  . COLONOSCOPY    .  CYSTOSCOPY WITH STENT PLACEMENT Left 10/03/2016   Procedure: CYSTOSCOPY WITH  LEFT URETERAL CATHETER PLACEMENT;  Surgeon: Bjorn PippinJohn Wrenn, MD;  Location: [facility] ORS;  Service: Urology;  Laterality: Left;  . PROCTOSCOPY  10/03/2016   Procedure: PROCTOSCOPY;  Surgeon: Romie LeveeAlicia Thomas, MD;  Location: [facility] ORS;  Service: General;;  . RIGHT/LEFT HEART CATH AND CORONARY ANGIOGRAPHY N/A 07/22/2017   Procedure: RIGHT/LEFT HEART CATH AND CORONARY ANGIOGRAPHY;  Surgeon: SwazilandJordan, Peter M, MD;  [facility];  Service: Cardiovascular;  Laterality: N/A;  . TONSILLECTOMY         Family History  Problem Relation Age of Onset  . Stroke Mother   . Colon cancer Neg Hx   . Esophageal cancer Neg Hx   . Rectal cancer Neg Hx   . Stomach cancer Neg Hx     Social History   Tobacco Use  . Smoking status: Current Every Day Smoker    Packs/day: 1.00    Years: 40.00    Pack years: 40.00    Types: Cigarettes  . Smokeless tobacco: Never Used  Vaping Use  . Vaping Use: Never used  Substance Use Topics  . Alcohol use: Yes    Alcohol/week: 3.0 standard drinks    Types: 3 Shots of liquor per week    Comment: pt states does not drink daily has beer with football  . Drug use: No    Home Medications Prior to Admission medications   Medication Sig Start Date End Date Taking? Authorizing Provider  Albuterol Sulfate 108 (90 Base) MCG/ACT AEPB Inhale 2 puffs into the lungs every 4 (four) hours as needed for shortness of breath. 02/11/17   [provider]  chlordiazePOXIDE (LIBRIUM) 25 MG capsule 50mg  PO TID x 1D, then 25-50mg  PO BID X 1D, then 25-50mg  PO QD X 1D Patient not taking: Reported on 05/25/2020 02/19/18   Phillis HaggisMabe, Martha L, MD  feeding supplement, ENSURE ENLIVE, (ENSURE ENLIVE) LIQD Take 237 mLs by mouth 2 (two) times daily between meals. Patient not taking: Reported on 02/19/2018 01/07/18   Penny PiaVega, [place], MD  folic acid (FOLVITE) 1 MG tablet Take 1 tablet (1 mg total) by mouth daily. Patient not taking: Reported on 02/19/2018 01/08/18   Penny PiaVega, Orlando, MD  hydrochlorothiazide (HYDRODIURIL) 25 MG tablet Take 25 mg by mouth every morning. 02/28/20   [provider]  HYDROcodone-acetaminophen (NORCO) 5-325 MG tablet Take 1-2 tablets by mouth every 6 (six) hours as needed. 05/25/20   Geoffery Lyonselo, Douglas, MD  lactulose (CHRONULAC) 10 GM/15ML solution Take 30 mLs (20 g total) by mouth 2 (two) times daily. Patient not taking: Reported on 02/19/2018 01/07/18   Penny PiaVega, Orlando, MD   metoprolol tartrate (LOPRESSOR) 25 MG tablet Take 1 tablet (25 mg total) by mouth 2 (two) times daily. Patient not taking: Reported on 05/25/2020 07/22/17 08/21/17  Arrien, York RamMauricio Daniel, MD  pantoprazole (PROTONIX) 40 MG tablet Take 1 tablet (40 mg total) by mouth daily at 6 (six) AM. Patient not taking: Reported on 02/19/2018 01/08/18   Penny PiaVega, Orlando, MD    Allergies    Patient has no known allergies.  Review of Systems   Review of Systems  Constitutional: Negative for chills and fever.  HENT: Negative for congestion and sore throat.   Eyes: Negative for visual disturbance.  Respiratory: Negative for cough and shortness of breath.   Cardiovascular: Negative for chest pain.  Gastrointestinal: Positive for nausea and vomiting. Negative for abdominal pain and diarrhea.  Genitourinary: Negative for dysuria, enuresis and  flank pain.  Musculoskeletal: Positive for back pain.  Skin: Negative for rash.  Neurological: Negative for dizziness and headaches.  Hematological: Does not bruise/bleed easily.    Physical Exam Updated Vital Signs BP (!) 86/52   Pulse 63   Temp 97.7 F (36.5 C)   Resp 10   Ht 5\' 9"  (1.753 m)   Wt 84.8 kg   SpO2 96%   BMI 27.62 kg/m   Physical Exam Vitals and nursing note reviewed. Exam conducted with a chaperone present.  Constitutional:      General: He is not in acute distress.    Appearance: He is not ill-appearing.  HENT:     Head: Normocephalic and atraumatic.     Nose: No congestion.     Mouth/Throat:     Mouth: Mucous membranes are dry.     Pharynx: Oropharynx is clear.  Eyes:     Conjunctiva/sclera: Conjunctivae normal.  Cardiovascular:     Rate and Rhythm: Normal rate and regular rhythm.     Pulses: Normal pulses.     Heart sounds: No murmur heard. No friction rub. No gallop.   Pulmonary:     Effort: No respiratory distress.     Breath sounds: No wheezing, rhonchi or rales.  Abdominal:     Palpations: Abdomen is soft.      Tenderness: There is no abdominal tenderness.  Genitourinary:    Rectum: Normal.     Comments: Patient had good and anal sphincter tone Musculoskeletal:     Right lower leg: No edema.     Left lower leg: No edema.     Comments: Patient is moving all 4 signs at difficulty.  Spine was palpated it was tender to palpation along his lumbar spine, there is no step-off, deformities or crepitus noted.  Patient's feet were visualized and they appeared mottled, were cool to the touch, difficulty palpating pedal dorsi pulse, he had full range of motion at his toes ankles and knees.  Skin:    General: Skin is warm and dry.  Neurological:     Mental Status: He is alert.  Psychiatric:        Mood and Affect: Mood normal.     ED Results / Procedures / Treatments   Labs (all labs ordered are listed, but only abnormal results are displayed) Labs Reviewed  COMPREHENSIVE METABOLIC PANEL - Abnormal; Notable for the following components:      Result Value   Sodium 114 (*)    Chloride 75 (*)    Glucose, Bld 103 (*)    BUN 50 (*)    Creatinine, Ser 2.80 (*)    Total Bilirubin 2.1 (*)    GFR, Estimated 24 (*)    Anion gap 16 (*)    All other components within normal limits  CBC WITH DIFFERENTIAL/PLATELET - Abnormal; Notable for the following components:   WBC 13.6 (*)    MCHC 37.2 (*)    Platelets 443 (*)    Neutro Abs 10.2 (*)    Monocytes Absolute 1.9 (*)    Abs Immature Granulocytes 0.22 (*)    All other components within normal limits  SARS CORONAVIRUS 2 BY RT PCR (HOSPITAL ORDER, PERFORMED IN [facility])  CULTURE, BLOOD (ROUTINE X 2)  CULTURE, BLOOD (ROUTINE X 2)  LACTIC ACID, PLASMA  ETHANOL  URINALYSIS, ROUTINE W REFLEX MICROSCOPIC  RAPID URINE DRUG SCREEN, HOSP PERFORMED  BASIC METABOLIC PANEL    EKG None  Radiology No results found.  Procedures .Critical Care Performed by: Carroll Sage, PA-C Authorized by: Carroll Sage, PA-C   Critical  care provider statement:    Critical care time (minutes):  45   Critical care time was exclusive of:  Separately billable procedures and treating other patients   Critical care was necessary to treat or prevent imminent or life-threatening deterioration of the following conditions:  Dehydration   Critical care was time spent personally by me on the following activities:  Discussions with consultants, evaluation of patient's response to treatment, examination of patient, ordering and performing treatments and interventions, ordering and review of laboratory studies, ordering and review of radiographic studies, pulse oximetry, re-evaluation of patient's condition and review of old charts   I assumed direction of critical care for this patient from another provider in my specialty: no     Care discussed with: admitting provider       Medications Ordered in ED Medications  sodium chloride 0.9 % bolus 1,000 mL (0 mLs Intravenous Stopped 11/21/20 1322)  fentaNYL (SUBLIMAZE) injection 50 mcg (50 mcg Intravenous Given 11/21/20 1320)  acetaminophen (TYLENOL) tablet 650 mg (650 mg Oral Given 11/21/20 1314)  sodium chloride 0.9 % bolus 1,000 mL (1,000 mLs Intravenous New Bag/Given 11/21/20 1315)    ED Course  I have reviewed the triage vital signs and the nursing notes.  Pertinent labs & imaging results that were available during my care of the patient were reviewed by me and considered in my medical decision making (see chart for details).    MDM Rules/Calculators/A&P                          Patient presents with back pain and hypotensive.  He is alert, does not appear in acute distress, vital signs show hypotension.  Will obtain basic lab work, provide patient with fluids and obtain imaging for further evaluation.   Patient is reevaluated, vital signs have improved current systolic blood pressures at 97, heart rate remains unchanged, patient is endorsing some pain in his lower back.  Will provide  him with fentanyl as I do not want to decrease his blood pressure any further.  will continue to monitor.  Patient was reevaluated after after second liter of fluids, vital signs have continued to improve patient is responding IV fluids.  He has no complaints at this time.   will consult with hospitalist for admissions of AKI due to severe dehydration.  Spoke with Dr. Ophelia Charter the hospitalist team who would like recommendations from critical care for further management.  Spoke with  Joneen Roach who feels patient can be manage by the hospitalist team as long as he remain stable, they will be available for  Consult if needed. Dr. Ophelia Charter was made aware of this.   CBC shows slight leukocytosis of 13.6, no signs of anemia.  CMP shows extreme hyponatremia of 114, elevated BUN of 50, elevated creatinine 2.8, increased total bilirubin 2.1, anion gap of 16.  Bladder scan obtained 71 mL within the bladder.  Lactic of 1.5, Covid negative.  I have low suspicion for spinal cord abnormality like spine equina as he had good anal sphincter tone, is able to move lower extremities without difficulty, denies urinary incontinence or retention, no signs of urinary retention as bladder scan showed 71 mL.  I have low suspicion for ischemic lower extremities as he had pedal pulses noted with Doppler and has a lactic of 1.5.  I suspect decrease in pulses  are secondary due to dehydration and he may have mild chronic PAD.  I have low suspicion for AAA or aortic dissection as patient has no history of AAA's, no risk factors, he has no abdominal tenderness, no pulsatile mass on exam.  Low suspicion patient have to have emergent hemodialysis as there is no severe electrolyte derailments, no signs of fluid overload, no signs of respiratory distress noted on exam.  I suspect increase in creatinine and BUN are secondary due to poor fluid intake and frequent diarrhea.  Low suspicion for systemic infection as patient is nontoxic-appearing, no  obvious source of infection on my exam.  Patient is noted to be hypovolemic and have a slight increases in his white blood count but I suspect this is secondary to dehydration.  Anticipate patient will need further IV hydration and monitoring for correction of his electrolytes.  Patient care was transferred to him admitting team.  Final Clinical Impression(s) / ED Diagnoses Final diagnoses:  AKI (acute kidney injury) (HCC)  Hyponatremia  Chronic bilateral low back pain with bilateral sciatica    Rx / DC Orders ED Discharge Orders    None       Carroll Sage, PA-C 11/21/20 1527    Gwyneth Sprout, MD 11/22/20 1102

## 2020-11-21 NOTE — ED Notes (Signed)
Dr Anitra Lauth aware of pt's bp. Pt is laying flat in bed and IV fluids started

## 2020-11-21 NOTE — ED Notes (Signed)
Pt eating dinner

## 2020-11-21 NOTE — ED Triage Notes (Signed)
Pt arrives via [facility] from home with c/o worsening chronic back pain. Reports back pain x2 weeks that has worsened over the past week, denies falls or trauma. Endorses difficulty ambulating.

## 2020-11-22 ENCOUNTER — Encounter (HOSPITAL_COMMUNITY): Payer: Self-pay | Admitting: Pulmonary Disease

## 2020-11-22 ENCOUNTER — Other Ambulatory Visit: Payer: Self-pay

## 2020-11-22 LAB — BASIC METABOLIC PANEL
Anion gap: 9 (ref 5–15)
Anion gap: 13 (ref 5–15)
Anion gap: 11 (ref 5–15)
BUN: 40 mg/dL — ABNORMAL HIGH (ref 8–23)
BUN: 36 mg/dL — ABNORMAL HIGH (ref 8–23)
BUN: 32 mg/dL — ABNORMAL HIGH (ref 8–23)
CO2: 22 mmol/L (ref 22–32)
CO2: 19 mmol/L — ABNORMAL LOW (ref 22–32)
CO2: 22 mmol/L (ref 22–32)
Calcium: 8.2 mg/dL — ABNORMAL LOW (ref 8.9–10.3)
Calcium: 8.7 mg/dL — ABNORMAL LOW (ref 8.9–10.3)
Calcium: 8.5 mg/dL — ABNORMAL LOW (ref 8.9–10.3)
Chloride: 91 mmol/L — ABNORMAL LOW (ref 98–111)
Chloride: 91 mmol/L — ABNORMAL LOW (ref 98–111)
Chloride: 90 mmol/L — ABNORMAL LOW (ref 98–111)
Creatinine, Ser: 1.52 mg/dL — ABNORMAL HIGH (ref 0.61–1.24)
Creatinine, Ser: 1.23 mg/dL (ref 0.61–1.24)
Creatinine, Ser: 1.28 mg/dL — ABNORMAL HIGH (ref 0.61–1.24)
GFR, Estimated: 50 mL/min — ABNORMAL LOW (ref >60)
GFR, Estimated: >60 mL/min (ref >60)
GFR, Estimated: >60 mL/min (ref >60)
Glucose, Bld: 111 mg/dL — ABNORMAL HIGH (ref 70–99)
Glucose, Bld: 86 mg/dL (ref 70–99)
Glucose, Bld: 106 mg/dL — ABNORMAL HIGH (ref 70–99)
Potassium: 3.3 mmol/L — ABNORMAL LOW (ref 3.5–5.1)
Potassium: 3.9 mmol/L (ref 3.5–5.1)
Potassium: 3.2 mmol/L — ABNORMAL LOW (ref 3.5–5.1)
Sodium: 122 mmol/L — ABNORMAL LOW (ref 135–145)
Sodium: 123 mmol/L — ABNORMAL LOW (ref 135–145)
Sodium: 123 mmol/L — ABNORMAL LOW (ref 135–145)

## 2020-11-22 LAB — CBC
HCT: 38.6 % — ABNORMAL LOW (ref 39.0–52.0)
Hemoglobin: 14 g/dL (ref 13.0–17.0)
MCH: 32.9 pg (ref 26.0–34.0)
MCHC: 36.3 g/dL — ABNORMAL HIGH (ref 30.0–36.0)
MCV: 90.6 fL (ref 80.0–100.0)
Platelets: 336 10*3/uL (ref 150–400)
RBC: 4.26 MIL/uL (ref 4.22–5.81)
RDW: 11.9 % (ref 11.5–15.5)
WBC: 8.3 10*3/uL (ref 4.0–10.5)
nRBC: 0 % (ref 0.0–0.2)

## 2020-11-22 LAB — MAGNESIUM: Magnesium: 2.1 mg/dL (ref 1.7–2.4)

## 2020-11-22 LAB — PHOSPHORUS: Phosphorus: 3 mg/dL (ref 2.5–4.6)

## 2020-11-22 LAB — URIC ACID: Uric Acid, Serum: 8.2 mg/dL (ref 3.7–8.6)

## 2020-11-22 LAB — CORTISOL: Cortisol, Plasma: 13.8 ug/dL

## 2020-11-22 LAB — OSMOLALITY: Osmolality: 274 mOsm/kg — ABNORMAL LOW (ref 275–295)

## 2020-11-22 LAB — TSH: TSH: 0.323 u[IU]/mL — ABNORMAL LOW (ref 0.350–4.500)

## 2020-11-22 MED ORDER — OXYCODONE HCL 5 MG PO TABS
5.0000 mg | ORAL_TABLET | Freq: Four times a day (QID) | ORAL | Status: DC | PRN
  Administered 2020-11-22 – 2020-11-24 (×5): 5 mg via ORAL
  Filled 2020-11-22 (×5): qty 1

## 2020-11-22 MED ORDER — ENOXAPARIN SODIUM 40 MG/0.4ML ~~LOC~~ SOLN
40.0000 mg | SUBCUTANEOUS | Status: DC
  Administered 2020-11-22 – 2020-11-23 (×2): 40 mg via SUBCUTANEOUS
  Filled 2020-11-22 (×2): qty 0.4

## 2020-11-22 MED ORDER — BISACODYL 5 MG PO TBEC: 5.0000 mg | DELAYED_RELEASE_TABLET | Freq: Every day | ORAL | Status: DC | PRN

## 2020-11-22 NOTE — Progress Notes (Signed)
PROGRESS NOTE    Alvin Benton  MHD:[id number] DOB: Mar 17, 1951 DOA: 11/21/2020 PCP: Pcp, No   Chief Complain: Weakness  Brief Narrative: Patient is a 70 year old male with history of COPD, chronic alcohol abuse who presented from home with complaints of weakness, poor balance.  He stated that he quit drinking about 2 weeks ago because he was not feeling well.  He was unable to ambulate, not eating or drinking for a week.  On presentation he was hypotensive but responded to 2 L of normal saline bolus.  On presentation he was found to have sodium of 144, creatinine of 2.8.  Lactate was normal.  He was admitted for the management of severe AKI, hyponatremia is most likely secondary to severe dehydration from decreased oral intake.  Patient was admitted under PCCM service.  Transferred to [facility] service on 11/22/2020.  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  Responded to IV fluids.  Continue IV fluids for today.  Hyponatremia: Most likely secondary to hypovolemic hyponatremia from decreased oral intake.  Sodium is improving.  Continue to monitor.  Target of correction is not more than 8 mEq per day.  Sodium is 123 today.  Check BMP tomorrow.  Will check SIADH panel also.  AKI: Because of poor oral intake.  Improving with IV fluids. He was also taking lisinopril-hydrochlorothiazide at home.  Chronic alcohol abuse: No evidence of withdrawal.  He is stated that he quit drinking about a week ago.  Continue thiamine folic acid.  Continue to monitor for withdrawal.  Hypertension: Currently blood pressure stable.  He was hypotensive on presentation.   Antihypertensives on hold.  History of COPD: Currently not in exacerbation.  Continue bronchodilators as needed.  On room air  Smoker: Smokes a pack a day.  Counseled cessation.  Continue nicotine patch  Paroxysmal A. fib: Not on anticoagulation.  Currently in normal sinus rhythm.CHA2DS2-VASc score is at least 2 with age and HTN.  Due to his chronic alcohol issues, is not safe to put him on anticoagulation emergently.  I will recommend to follow-up with his primary care physician/cardiology as an outpatient for discussion of initiation of coagulation if needed.  Hypokalemia: Supplemented with potassium.  Back pain/debility/deconditioning: History of lumbar spine compression fractures.  He was planned for surgery but delayed due to Covid pandemic.  Continue supportive care.  PT/OT evaluation requested.          DVT prophylaxis:Lovenox Code Status: DNR Family Communication: None at bedside Status is: Inpatient  Remains inpatient appropriate because:Inpatient level of care appropriate due to severity of illness   Dispo: The patient is from: Home              Anticipated d/c is to: Home vs SnF              Anticipated d/c date is: 1-2 days              Patient currently is not medically stable to d/c.   Difficult to place patient : No     Consultants: PCCM  Procedures:None  Antimicrobials:  Anti-infectives (From admission, onward)   None  Subjective: Patient seen and examined the bedside this afternoon.  Hemodynamically stable.  Sitting on a chair.  Feels much better.  Had his lunch.  Complains of back pain  Objective: Vitals:   11/22/20 0730 11/22/20 0735 11/22/20 0845 11/22/20 0909  BP: 123/63 123/63 134/64 115/66  Pulse: 69 67 71 71  Resp: 19 20 18 18   Temp:  98.4 F (36.9 C) (!) 97.5 F (36.4 C) 98 F (36.7 C)  TempSrc:  Oral Oral Oral  SpO2: 98% 98% 97% 100%  Weight:      Height:        Intake/Output Summary (Last 24 hours) at 11/22/2020 1254 Last  data filed at 11/22/2020 11/24/2020 Gross per 24 hour  Intake 3000 ml  Output 1675 ml  Net 1325 ml   Filed Weights   11/21/20 1049  Weight: 84.8 kg    Examination:  General exam: Appears calm and comfortable ,Not in distress,average built HEENT:PERRL,Oral mucosa moist, Ear/Nose normal on gross exam Respiratory system: Bilateral equal air entry, normal vesicular breath sounds, no wheezes or crackles  Cardiovascular system: S1 & S2 heard, RRR. No JVD, murmurs, rubs, gallops or clicks. No pedal edema. Gastrointestinal system: Abdomen is nondistended, soft and nontender. No organomegaly or masses felt. Normal bowel sounds heard. Central nervous system: Alert and oriented. No focal neurological deficits. Extremities: No edema, no clubbing ,no cyanosis Skin: No rashes, lesions or ulcers,no icterus ,no pallor   Data Reviewed: I have personally reviewed following labs and imaging studies  CBC: Recent Labs  Lab 11/21/20 1057 11/22/20 0400  WBC 13.6* 8.3  NEUTROABS 10.2*  --   HGB 16.1 14.0  HCT 43.3 38.6*  MCV 88.5 90.6  PLT 443* 336   Basic Metabolic Panel: Recent Labs  Lab 11/21/20 1535 11/21/20 2122 11/22/20 0021 11/22/20 0400 11/22/20 0923  NA 116* 120* 122* 123* 123*  K 3.8 3.4* 3.3* 3.9 3.2*  CL 84* 89* 91* 91* 90*  CO2 20* 20* 22 19* 22  GLUCOSE 81 79 111* 86 106*  BUN 50* 43* 40* 36* 32*  CREATININE 2.45* 1.89* 1.52* 1.23 1.28*  CALCIUM 8.0* 8.2* 8.2* 8.7* 8.5*  MG  --   --   --  2.1  --   PHOS  --   --   --  3.0  --    GFR: Estimated Creatinine Clearance: 56 mL/min (A) (by C-G formula based on SCr of 1.28 mg/dL (H)). Liver Function Tests: Recent Labs  Lab 11/21/20 1057  AST 39  ALT 26  ALKPHOS 47  BILITOT 2.1*  PROT 6.7  ALBUMIN 4.1   No results for input(s): LIPASE, AMYLASE in the last 168 hours. No results for input(s): AMMONIA in the last 168 hours. Coagulation Profile: No results for input(s): INR, PROTIME in the last 168 hours. Cardiac  Enzymes: No results for input(s): CKTOTAL, CKMB, CKMBINDEX, TROPONINI in the last 168 hours. BNP (last 3 results) No results for input(s): PROBNP in the last 8760 hours. HbA1C: No results for input(s): HGBA1C in the last 72 hours. CBG: No results for input(s): GLUCAP in the last 168 hours. Lipid Profile: No results for input(s): CHOL, HDL, LDLCALC, TRIG, CHOLHDL, LDLDIRECT in the last 72 hours. Thyroid Function Tests: No results for input(s): TSH, T4TOTAL, FREET4, T3FREE, THYROIDAB in the last 72 hours. Anemia Panel: No results for input(s): VITAMINB12, FOLATE, FERRITIN, TIBC, IRON, RETICCTPCT in the last 72 hours. Sepsis Labs: Recent Labs  Lab 11/21/20 1220  LATICACIDVEN 1.5    Recent Results (  from the past 240 hour(s))  Blood culture (routine x 2)     Status: None (Preliminary result)   Collection Time: 11/21/20 11:28 AM   Specimen: BLOOD LEFT HAND  Result Value Ref Range Status   Specimen Description BLOOD LEFT HAND  Final   Special Requests   Final    BOTTLES DRAWN AEROBIC AND ANAEROBIC Blood Culture results may not be optimal due to an inadequate volume of blood received in culture bottles   Culture   Final    NO GROWTH < 24 HOURS Performed at [facility], [address] [place], [place] [address]    Report Status PENDING  Incomplete  SARS Coronavirus 2 by RT PCR (hospital order, performed in [facility] hospital lab) Nasopharyngeal Nasopharyngeal Swab     Status: None   Collection Time: 11/21/20 11:33 AM   Specimen: Nasopharyngeal Swab  Result Value Ref Range Status   SARS Coronavirus 2 NEGATIVE NEGATIVE Final    Comment: (NOTE) SARS-CoV-2 target nucleic acids are NOT DETECTED.  The SARS-CoV-2 RNA is generally detectable in upper and lower respiratory specimens during the acute phase of infection. The lowest concentration of SARS-CoV-2 viral copies this assay can detect is 250 copies / mL. A negative result does not preclude SARS-CoV-2 infection and should  not be used as the sole basis for treatment or other patient management decisions.  A negative result may occur with improper specimen collection / handling, submission of specimen other than nasopharyngeal swab, presence of viral mutation(s) within the areas targeted by this assay, and inadequate number of viral copies (<250 copies / mL). A negative result must be combined with clinical observations, patient history, and epidemiological information.  Fact Sheet for Patients:   BoilerBrush.com.cy  Fact Sheet for Healthcare Providers: https://pope.com/  This test is not yet approved or  cleared by the [place] FDA and has been authorized for detection and/or diagnosis of SARS-CoV-2 by FDA under an Emergency Use Authorization (EUA).  This EUA will remain in effect (meaning this test can be used) for the duration of the COVID-19 declaration under Section 564(b)(1) of the Act, 21 U.S.C. section 360bbb-3(b)(1), unless the authorization is terminated or revoked sooner.  Performed at [facility], [address] [place], [place] [address]   Blood culture (routine x 2)     Status: None (Preliminary result)   Collection Time: 11/21/20 12:42 PM   Specimen: BLOOD  Result Value Ref Range Status   Specimen Description BLOOD SITE NOT SPECIFIED  Final   Special Requests   Final    BOTTLES DRAWN AEROBIC AND ANAEROBIC Blood Culture results may not be optimal due to an inadequate volume of blood received in culture bottles   Culture   Final    NO GROWTH < 24 HOURS Performed at [facility], [address], [place], [place] [address]    Report Status PENDING  Incomplete         Radiology Studies: No results found.      Scheduled Meds: . docusate sodium  100 mg Oral BID  . enoxaparin (LOVENOX) injection  40 mg Subcutaneous Q24H  . folic acid  1 mg Oral Daily  . lidocaine  3 patch Transdermal Q24H  . multivitamin with  minerals  1 tablet Oral Daily  . nicotine  21 mg Transdermal Daily  . sodium chloride flush  3 mL Intravenous Q12H  . thiamine  100 mg Oral Daily   Or  . thiamine  100 mg Intravenous Daily   Continuous Infusions: . sodium chloride 125 mL/hr at 11/22/20 0811     LOS: 1 day    Time spent:25 mins. More than 50% of that time was spent in counseling and/or coordination of care.      Burnadette Pop, MD [facility] P1/26/2022, 12:54 PM

## 2020-11-22 NOTE — Progress Notes (Signed)
11/22/20 1440  PT Evaluation Information  Last PT Received On 11/22/20  Assistance Needed +1 (Chair follow helpful)  History of Present Illness 70 y.o. male presenting with generalized weakness, difficulty ambulating and poor balance x2 weeks. Patient notes that he recently quit drinking but has been feeling "unwell" since. Patient admitted with dehydration, hyponatremia, and AKI. Patient also found to have MRSA. PMHx significant for A-fib, HTN, heavy ETOH use, COPD, tobacco use 1 pack/day.  Precautions  Precautions Fall  Restrictions  Weight Bearing Restrictions No  Home Living  Family/patient expects to be discharged to: Private residence  Living Arrangements Alone  Available Help at Discharge Other (Comment) (None)  Type of Home Apartment  Home Access Stairs to enter  Entrance Stairs-Number of Steps 12  Entrance Stairs-Rails Right  Home Layout One level  Bathroom Shower/Tub Tub/shower unit  Dealer - single point  Prior Function  Level of Independence Needs assistance  Gait / Transfers Assistance Needed Mod I with use of SPC until ~2 weeks ago. Patient reports rolling around in office chair for last several weeks 2/2 difficulty ambulating.  ADL's / Homemaking Assistance Needed Mod I at baseline. Recently reports inability to bathe himself. Patient also reports inability to prepare meals stating that he hasn't eaten for several days.  Communication  Communication No difficulties  Pain Assessment  Pain Assessment Faces  Faces Pain Scale 6  Pain Location Low back into BLE L>R.  Pain Descriptors / Indicators Constant;Shooting;Aching  Pain Intervention(s) Limited activity within patient's tolerance;Monitored during session;Repositioned  Cognition  Arousal/Alertness Awake/alert  Behavior During Therapy WFL for tasks assessed/performed  Overall Cognitive Status No family/caregiver present to determine baseline cognitive functioning  General  Comments Pt with difficulty sequencing when asking pt to take side steps.  Upper Extremity Assessment  Upper Extremity Assessment Defer to OT evaluation  Lower Extremity Assessment  Lower Extremity Assessment Generalized weakness;LLE deficits/detail  LLE Deficits / Details Increased pain shooting down into LLE. Difficulty performing LAQ as well.  Cervical / Trunk Assessment  Cervical / Trunk Assessment Normal  Bed Mobility  Overal bed mobility Needs Assistance  Bed Mobility Supine to Sit;Sit to Supine  Supine to sit Min assist  Sit to supine Min assist  General bed mobility comments Min A for trunk and LE assist.  Transfers  Overall transfer level Needs assistance  Equipment used Rolling walker (2 wheeled)  Transfers Sit to/from Stand  Sit to Stand Min assist;From elevated surface  General transfer comment Min A for sit to stand from slightly elevated EOB. Pt reporting increased pain in standing.  Ambulation/Gait  Ambulation/Gait assistance Min assist;Mod assist  Gait Distance (Feet) 3 Feet  Assistive device Rolling walker (2 wheeled)  Gait Pattern/deviations Shuffle;Decreased stride length;Step-through pattern  General Gait Details shuffle type steps and only able to tolerate very short distance. When asked to take side steps, pt with difficulty sequencing. Required step by step cues.  Gait velocity Decreased  Balance  Overall balance assessment Needs assistance  Sitting-balance support Feet supported  Sitting balance-Leahy Scale Fair  Standing balance support During functional activity;Bilateral upper extremity supported  Standing balance-Leahy Scale Poor  Standing balance comment Reliant on external assist and BUE support.  PT - End of Session  Equipment Utilized During Treatment Gait belt  Activity Tolerance Patient limited by pain  Patient left in bed;with call bell/phone within reach;with bed alarm set;with nursing/sitter in room  Nurse Communication Mobility status  PT  Assessment  PT Recommendation/Assessment  Patient needs continued PT services  PT Visit Diagnosis Unsteadiness on feet (R26.81);Muscle weakness (generalized) (M62.81);Difficulty in walking, not elsewhere classified (R26.2);Pain  Pain - Right/Left Left  Pain - part of body Leg (back)  PT Problem List Decreased strength;Decreased balance;Decreased activity tolerance;Decreased mobility;Decreased knowledge of use of DME;Decreased safety awareness;Decreased knowledge of precautions;Pain  Barriers to Discharge Decreased caregiver support  PT Plan  PT Frequency (ACUTE ONLY) Min 2X/week  PT Treatment/Interventions (ACUTE ONLY) DME instruction;Gait training;Functional mobility training;Therapeutic activities;Therapeutic exercise;Balance training;Patient/family education  AM-PAC PT "6 Clicks" Mobility Outcome Measure (Version 2)  Help needed turning from your back to your side while in a flat bed without using bedrails? 3  Help needed moving from lying on your back to sitting on the side of a flat bed without using bedrails? 3  Help needed moving to and from a bed to a chair (including a wheelchair)? 2  Help needed standing up from a chair using your arms (e.g., wheelchair or bedside chair)? 3  Help needed to walk in hospital room? 2  Help needed climbing 3-5 steps with a railing?  1  6 Click Score 14  Consider Recommendation of Discharge To: CIR/SNF/LTACH  PT Recommendation  Follow Up Recommendations SNF;Supervision/Assistance - 24 hour  PT equipment Rolling walker with 5" wheels  Individuals Consulted  Consulted and Agree with Results and Recommendations Patient  Acute Rehab PT Goals  Patient Stated Goal To go to rehab.  PT Goal Formulation With patient  Time For Goal Achievement 12/06/20  Potential to Achieve Goals Fair  PT Time Calculation  PT Start Time (ACUTE ONLY) 1053  PT Stop Time (ACUTE ONLY) 1116  PT Time Calculation (min) (ACUTE ONLY) 23 min  PT General Charges  $$ ACUTE PT VISIT  1 Visit  PT Evaluation  $PT Eval Moderate Complexity 1 Mod  PT Treatments  $Therapeutic Activity 8-22 mins  Written Expression  Dominant Hand Right   Pt admitted secondary to problem above with deficits below. Pt with poor mobility tolerance secondary to pain. Requiring min to mod A to take steps using RW. Pt currently lives alone and has increased difficulty performing mobility and ADL tasks. Feel pt would benefit from SNF level therapies at d/c. Will continue to follow acutely.   Farley Ly, PT, DPT  Acute Rehabilitation Services  Pager: [phone] Office: [phone]

## 2020-11-22 NOTE — Evaluation (Signed)
Occupational Therapy Evaluation Patient Details Name: Alvin Benton MRN: [id number] DOB: Apr 22, 1951 Today's Date: 11/22/2020    History of Present Illness 70 y.o. male presenting with generalized weakness, difficulty ambulating and poor balance x2 weeks. Patient notes that he recently quit drinking but has been feeling "unwell" since. Patient admitted with dehydration, hyponatremia, and AKI. Patient also found to have MRSA. PMHx significant for A-fib, HTN, heavy ETOH use, COPD, tobacco use 1 pack/day.   Clinical Impression   PTA patient was living alone with his cat in a 2nd floor apartment with 12 STE. Patient Mod I with use of SPC at baseline but has has increased difficulty with ambulation over the last several weeks. Patient reports inability to ambulate using a rolling office chair to get around his home. Patient also reports inability to bathe 2/2 weakness and difficulty ambulation x last several weeks. Patient currently requiring Min to Mod A overall for ADLs, ADL transfers, and very short-distance functional mobility demonstrating shuffle-like gait and poor balance. Please defer to PT note for additional detail. Patient would benefit from continued acute OT services to maximize safety and independence with self-care tasks and decrease risk of falls. Given patient CLOF and decrease caregiver support, recommendation for SNF rehab. Patient in agreement with POC but will need assistance contacting a neighbor to assist with looking after his cat.     Follow Up Recommendations  SNF;Supervision/Assistance - 24 hour    Equipment Recommendations  3 in 1 bedside commode    Recommendations for Other Services       Precautions / Restrictions Precautions Precautions: Fall Precaution Comments: High Fall Risk Restrictions Weight Bearing Restrictions: No      Mobility Bed Mobility Overal bed mobility: Needs Assistance Bed Mobility: Supine to Sit     Supine to sit: Min assist     General  bed mobility comments: Patient able to advance BLE toward EOB with Min A to bring trunk upright.    Transfers Overall transfer level: Needs assistance Equipment used: Straight cane Transfers: Sit to/from [employer] Sit to Stand: Min assist Stand pivot transfers: Mod assist       General transfer comment: Min A for sit to stand from slightly elevated EOB. Mod A for stand-pivot to recliner with SPC and cues for proximity to chair.    Balance Overall balance assessment: Needs assistance Sitting-balance support: Bilateral upper extremity supported;Feet supported Sitting balance-Leahy Scale: Fair     Standing balance support: Single extremity supported;During functional activity Standing balance-Leahy Scale: Poor Standing balance comment: Reliant on external assist and unilateral UE support on SPC.                           ADL either performed or assessed with clinical judgement   ADL Overall ADL's : Needs assistance/impaired     Grooming: Set up;Sitting           Upper Body Dressing : Set up;Sitting       Toilet Transfer: Moderate assistance Toilet Transfer Details (indicate cue type and reason): Simulated with stand-pivot to recliner with use of SPC and Mod A.         Functional mobility during ADLs: Moderate assistance;Cane General ADL Comments: Mod A for 2-3 steps toward recliner with use of SPC. Patient with forward lean and shuffle-like gait.     Vision Patient Visual Report: No change from baseline Vision Assessment?: No apparent visual deficits     Perception  Praxis      Pertinent Vitals/Pain Pain Assessment: 0-10 Pain Score: 5  Pain Location: Low back into BLE L>R. Pain Descriptors / Indicators: Constant;Shooting;Aching Pain Intervention(s): Limited activity within patient's tolerance;Monitored during session;Repositioned     Hand Dominance Right   Extremity/Trunk Assessment Upper Extremity Assessment Upper  Extremity Assessment: Generalized weakness   Lower Extremity Assessment Lower Extremity Assessment: Defer to PT evaluation   Cervical / Trunk Assessment Cervical / Trunk Assessment: Normal   Communication Communication Communication: No difficulties   Cognition Arousal/Alertness: Awake/alert Behavior During Therapy: WFL for tasks assessed/performed Overall Cognitive Status: Within Functional Limits for tasks assessed                                     General Comments  Echymosis at BUE.    Exercises     Shoulder Instructions      Home Living Family/patient expects to be discharged to:: Private residence Living Arrangements: Alone Available Help at Discharge: Other (Comment) (None) Type of Home: Apartment Home Access: Stairs to enter Entrance Stairs-Number of Steps: 12 Entrance Stairs-Rails: Right Home Layout: One level     Bathroom Shower/Tub: Chief Strategy Officer: Standard     Home Equipment: Cane - single point          Prior Functioning/Environment Level of Independence: Needs assistance  Gait / Transfers Assistance Needed: Mod I with use of SPC until ~2 weeks ago. Patient reports rolling around in office chair for last several weeks 2/2 difficulty ambulating. ADL's / Homemaking Assistance Needed: Mod I at baseline. Recently reports inability to bathe himself. Patient also reports inability to prepare meals stating that he hasn't eaten for several days.   Comments: Reports no falls.        OT Problem List: Decreased strength;Impaired balance (sitting and/or standing);Decreased knowledge of use of DME or AE      OT Treatment/Interventions: Self-care/ADL training;Therapeutic exercise;Energy conservation;DME and/or AE instruction;Therapeutic activities;Patient/family education;Balance training    OT Goals(Current goals can be found in the care plan section) Acute Rehab OT Goals Patient Stated Goal: To go to rehab. OT Goal  Formulation: With patient Time For Goal Achievement: 12/06/20 Potential to Achieve Goals: Good ADL Goals Pt Will Perform Grooming: with supervision;standing Pt Will Perform Upper Body Dressing: with set-up;sitting Pt Will Perform Lower Body Dressing: with supervision;sit to/from stand Pt Will Transfer to Toilet: with supervision;ambulating Pt Will Perform Toileting - Clothing Manipulation and hygiene: with supervision;sit to/from stand Pt/caregiver will Perform Home Exercise Program: Both right and left upper extremity;Increased ROM;Increased strength;With written HEP provided  OT Frequency: Min 2X/week   Barriers to D/C: Decreased caregiver support;Inaccessible home environment  Lives alone in 2nd floor apartment.       Co-evaluation              AM-PAC OT "6 Clicks" Daily Activity     Outcome Measure Help from another person eating meals?: None Help from another person taking care of personal grooming?: A Little Help from another person toileting, which includes using toliet, bedpan, or urinal?: A Lot Help from another person bathing (including washing, rinsing, drying)?: A Lot Help from another person to put on and taking off regular upper body clothing?: A Little Help from another person to put on and taking off regular lower body clothing?: A Lot 6 Click Score: 16   End of Session Equipment Utilized During Treatment: Gait belt;Other (comment) (SPC)  Activity Tolerance: Patient tolerated treatment well Patient left: in chair;with call bell/phone within reach;with chair alarm set  OT Visit Diagnosis: Unsteadiness on feet (R26.81);Muscle weakness (generalized) (M62.81);Pain Pain - Right/Left: Left Pain - part of body: Leg (Low back)                Time: 1230-1250 OT Time Calculation (min): 20 min Charges:  OT General Charges $OT Visit: 1 Visit OT Evaluation $OT Eval Moderate Complexity: 1 Mod  Exie Chrismer H. OTR/L Supplemental OT, Department of rehab services  [phone]  Princetta Uplinger R H. 11/22/2020, 1:08 PM

## 2020-11-22 NOTE — [place] FL2 (Addendum)
[place] MEDICAID FL2 LEVEL OF CARE SCREENING TOOL     IDENTIFICATION  Patient Name: Alvin Benton Birthdate: December 21, 1950 Sex: male Admission Date (Current Location): 11/21/2020  [facility] and [place] Number:  Producer, television/film/video and Address:  [facility] [place]. [facility], [address], [place], [place] [address]      Provider Number: [id number]  Attending Physician Name and Address:  Burnadette Pop, MD  Relative Name and Phone Number:       Current Level of Care: Hospital Recommended Level of Care: Skilled Nursing Facility Prior Approval Number:    Date Approved/Denied:   PASRR Number: [id number]  Discharge Plan: SNF    Current Diagnoses: Patient Active Problem List   Diagnosis Date Noted  . Chronic back pain greater than 3 months duration 11/21/2020  . AKI (acute kidney injury) (HCC) 11/21/2020  . Acute alcoholic pancreatitis 01/01/2018  . Acute alcoholic hepatitis 01/01/2018  . Elevated troponin   . Nausea & vomiting 07/21/2017  . Atrial fibrillation with RVR (HCC) 07/21/2017  . Alcoholic ketoacidosis 07/21/2017  . PNA (pneumonia): pROBABLE ASPIRATION 05/21/2017  . Dehydration 05/20/2017  . Syncope 02/26/2017  . History of atrial fibrillation 02/26/2017  . Right flank hematoma 02/26/2017  . Zygomatic fracture, right side, initial encounter for closed fracture (HCC) 02/26/2017  . Multiple rib fractures 02/26/2017  . Acute pulmonary edema (HCC)   . Dizziness   . Fall   . Dehydration with hyponatremia 02/20/2017  . Alcohol dependence in early full remission (HCC) 06/04/2016  . Volume depletion 06/04/2016  . Hypokalemia 06/04/2016  . Diverticulitis of intestine with perforation without bleeding 06/04/2016  . Essential hypertension   . Hypomagnesemia   . Ileus (HCC) 05/05/2016  . Systolic murmur 05/05/2016  . Anxiety state 04/19/2016  . 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 Hypoxia 04/05/2016  . Atrial fibrillation (HCC) 04/05/2016  . Diverticulitis  of large intestine with perforation and abscess 03/14/2016  . Chronic constipation 03/08/2016  . Alcohol abuse 02/15/2016  . Tobacco use disorder 07/17/2015  . Allergic rhinitis 07/17/2015  . Reactive airway disease with wheezing 07/17/2015  . Overweight (BMI 25.0-29.9) 07/17/2015  . Hand pain, left 01/12/2014  . LLQ pain-chronic, intermittent and episodic 09/17/2013    Orientation RESPIRATION BLADDER Height & Weight     Self,Time,Situation,Place  Normal Continent Weight: 187 lb (84.8 kg) Height:  5\' 9"  (175.3 cm)  BEHAVIORAL SYMPTOMS/MOOD NEUROLOGICAL BOWEL NUTRITION STATUS      Continent Diet (See DC Summary)  AMBULATORY STATUS COMMUNICATION OF NEEDS Skin   Extensive Assist Verbally Normal                       Personal Care Assistance Level of Assistance  Bathing,Dressing,Feeding Bathing Assistance: Limited assistance Feeding assistance: Independent Dressing Assistance: Limited assistance     Functional Limitations Info  Sight,Hearing,Speech Sight Info: Impaired (Wears Glasses) Hearing Info: Adequate Speech Info: Adequate    SPECIAL CARE FACTORS FREQUENCY  PT (By licensed PT),OT (By licensed OT)     PT Frequency: 5xWeek OT Frequency: 5xWeek            Contractures Contractures Info: Not present    Additional Factors Info  Code Status,Allergies Code Status Info: DNR Allergies Info: No Known Allergies           Current Medications (11/22/2020):  This is the current hospital active medication list Current Facility-Administered Medications  Medication Dose Route Frequency Provider Last Rate Last Admin  . 0.9 %  sodium chloride infusion  Intravenous Continuous Burnadette Pop, MD 125 mL/hr at 11/22/20 0811 Rate Change at 11/22/20 0811  . acetaminophen (TYLENOL) tablet 650 mg  650 mg Oral Q6H PRN Jonah Blue, MD   650 mg at 11/22/20 8786   Or  . acetaminophen (TYLENOL) suppository 650 mg  650 mg Rectal Q6H PRN Jonah Blue, MD      . albuterol  (PROVENTIL) (2.5 MG/3ML) 0.083% nebulizer solution 3 mL  3 mL Inhalation Q4H PRN Jonah Blue, MD      . bisacodyl (DULCOLAX) EC tablet 5 mg  5 mg Oral Daily PRN Jonah Blue, MD      . docusate sodium (COLACE) capsule 100 mg  100 mg Oral BID Jonah Blue, MD   100 mg at 11/22/20 1100  . docusate sodium (COLACE) capsule 100 mg  100 mg Oral BID PRN Duayne Cal, NP      . enoxaparin (LOVENOX) injection 40 mg  40 mg Subcutaneous Q24H Adhikari, Amrit, MD      . folic acid (FOLVITE) tablet 1 mg  1 mg Oral Daily Jonah Blue, MD   1 mg at 11/22/20 1100  . hydrALAZINE (APRESOLINE) injection 5 mg  5 mg Intravenous Q4H PRN Jonah Blue, MD      . lidocaine (LIDODERM) 5 % 3 patch  3 patch Transdermal Q24H Jonah Blue, MD   3 patch at 11/21/20 2051  . LORazepam (ATIVAN) tablet 1-4 mg  1-4 mg Oral Q1H PRN Jonah Blue, MD       Or  . LORazepam (ATIVAN) injection 1-4 mg  1-4 mg Intravenous Q1H PRN Jonah Blue, MD      . multivitamin with minerals tablet 1 tablet  1 tablet Oral Daily Jonah Blue, MD   1 tablet at 11/22/20 1100  . nicotine (NICODERM CQ - dosed in mg/24 hours) patch 21 mg  21 mg Transdermal Daily Jonah Blue, MD   21 mg at 11/22/20 1100  . ondansetron (ZOFRAN) tablet 4 mg  4 mg Oral Q6H PRN Jonah Blue, MD       Or  . ondansetron [facility]) injection 4 mg  4 mg Intravenous Q6H PRN Jonah Blue, MD      . polyethylene glycol (MIRALAX / GLYCOLAX) packet 17 g  17 g Oral Daily PRN Oretha Milch, MD      . sodium chloride flush (NS) 0.9 % injection 3 mL  3 mL Intravenous Q12H Jonah Blue, MD      . thiamine tablet 100 mg  100 mg Oral Daily Jonah Blue, MD   100 mg at 11/21/20 1713   Or  . thiamine (B-1) injection 100 mg  100 mg Intravenous Daily Jonah Blue, MD   100 mg at 11/22/20 1059   Facility-Administered Medications Ordered in Other Encounters  Medication Dose Route Frequency Provider Last Rate Last Admin  . lactated ringers infusion     Continuous PRN Epimenio Sarin, CRNA   Stopped at 08/29/16 7672     Discharge Medications: Please see discharge summary for a list of discharge medications.  Relevant Imaging Results:  Relevant Lab Results:   Additional Information SSN: [id number]  Chana Bode, Student-Social Work

## 2020-11-22 NOTE — TOC Initial Note (Signed)
Transition of Care [facility]) - Initial/Assessment Note    Patient Details  Name: Alvin Benton MRN: [id number] Date of Birth: 06-29-1951  [facility] of [facility]) CM/SW Contact:    Chana Bode, Student-Social Work Phone Number: 11/22/2020, 2:19 PM  Clinical Narrative: MSW Student spoke with patient about his efforts to quit drinking. Patient insists that he doesn't have trouble quitting and hasn't had a drink in three weeks. He states that constant pain drives him to drink alcohol. Patient refused substance abuse resources and is confident in his ability to stay sober on his own.                       Barriers to Discharge: Continued Medical Work up   Patient Goals and CMS Choice        Expected Discharge Plan and Services         Living arrangements for the past 2 months: Apartment                                      Prior Living Arrangements/Services Living arrangements for the past 2 months: Apartment Lives with:: Self Patient language and need for interpreter reviewed:: Yes        Need for Family Participation in Patient Care: No (Comment) Care giver support system in place?: No (comment)   Criminal Activity/Legal Involvement Pertinent to Current Situation/Hospitalization: No - Comment as needed  Activities of Daily Living Home Assistive Devices/Equipment: Cane (specify quad or straight) ADL Screening (condition at time of admission) Patient's cognitive ability adequate to safely complete daily activities?: Yes Is the patient deaf or have difficulty hearing?: No Does the patient have difficulty seeing, even when wearing glasses/contacts?: No Does the patient have difficulty concentrating, remembering, or making decisions?: No Patient able to express need for assistance with ADLs?: Yes Does the patient have difficulty dressing or bathing?: Yes (Prefers showering, but finds it difficult to do so lately d/t pain) Independently performs ADLs?: Yes  (appropriate for developmental age) Does the patient have difficulty walking or climbing stairs?: Yes Weakness of Legs: Both Weakness of Arms/Hands: None  Permission Sought/Granted                  Emotional Assessment Appearance:: Disheveled,Appears stated age Attitude/Demeanor/Rapport: Engaged Affect (typically observed): Pleasant,Calm,Accepting Orientation: : Oriented to Self,Oriented to Place,Oriented to  Time,Oriented to Situation Alcohol / Substance Use: Alcohol Use,Tobacco Use Psych Involvement: No (comment)  Admission diagnosis:  Hyponatremia [E87.1] Dehydration with hyponatremia [E86.0, E87.1] AKI (acute kidney injury) (HCC) [N17.9] Chronic bilateral low back pain with bilateral sciatica [M54.42, M54.41, G89.29] Patient Active Problem List   Diagnosis Date Noted  . Chronic back pain greater than 3 months duration 11/21/2020  . AKI (acute kidney injury) (HCC) 11/21/2020  . Acute alcoholic pancreatitis 01/01/2018  . Acute alcoholic hepatitis 01/01/2018  . Elevated troponin   . Nausea & vomiting 07/21/2017  . Atrial fibrillation with RVR (HCC) 07/21/2017  . Alcoholic ketoacidosis 07/21/2017  . PNA (pneumonia): pROBABLE ASPIRATION 05/21/2017  . Dehydration 05/20/2017  . Syncope 02/26/2017  . History of atrial fibrillation 02/26/2017  . Right flank hematoma 02/26/2017  . Zygomatic fracture, right side, initial encounter for closed fracture (HCC) 02/26/2017  . Multiple rib fractures 02/26/2017  . Acute pulmonary edema (HCC)   . Dizziness   . Fall   . Dehydration with hyponatremia 02/20/2017  . Alcohol dependence in early full  remission (HCC) 06/04/2016  . Volume depletion 06/04/2016  . Hypokalemia 06/04/2016  . Diverticulitis of intestine with perforation without bleeding 06/04/2016  . Essential hypertension   . Hypomagnesemia   . Ileus (HCC) 05/05/2016  . Systolic murmur 05/05/2016  . Anxiety state 04/19/2016  . 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 Hypoxia  04/05/2016  . Atrial fibrillation (HCC) 04/05/2016  . Diverticulitis of large intestine with perforation and abscess 03/14/2016  . Chronic constipation 03/08/2016  . Alcohol abuse 02/15/2016  . Tobacco use disorder 07/17/2015  . Allergic rhinitis 07/17/2015  . Reactive airway disease with wheezing 07/17/2015  . Overweight (BMI 25.0-29.9) 07/17/2015  . Hand pain, left 01/12/2014  . LLQ pain-chronic, intermittent and episodic 09/17/2013   PCP:  Pcp, No Pharmacy:   [id number] [place],  - [address] [place] [address] Phone: [phone] Fax: [phone]     Social Determinants of Health (SDOH) Interventions    Readmission Risk Interventions No flowsheet data found.

## 2020-11-22 NOTE — ED Notes (Signed)
Attempted report x1. 

## 2020-11-23 LAB — T4, FREE: Free T4: 1.3 ng/dL — ABNORMAL HIGH (ref 0.61–1.12)

## 2020-11-23 LAB — BASIC METABOLIC PANEL
Anion gap: 9 (ref 5–15)
BUN: 21 mg/dL (ref 8–23)
CO2: 21 mmol/L — ABNORMAL LOW (ref 22–32)
Calcium: 8.4 mg/dL — ABNORMAL LOW (ref 8.9–10.3)
Chloride: 95 mmol/L — ABNORMAL LOW (ref 98–111)
Creatinine, Ser: 0.96 mg/dL (ref 0.61–1.24)
GFR, Estimated: >60 mL/min (ref >60)
Glucose, Bld: 96 mg/dL (ref 70–99)
Potassium: 3.4 mmol/L — ABNORMAL LOW (ref 3.5–5.1)
Sodium: 125 mmol/L — ABNORMAL LOW (ref 135–145)

## 2020-11-23 LAB — OSMOLALITY, URINE: Osmolality, Ur: 233 mOsm/kg — ABNORMAL LOW (ref 300–900)

## 2020-11-23 LAB — SODIUM, URINE, RANDOM: Sodium, Ur: 58 mmol/L

## 2020-11-23 MED ORDER — ENSURE ENLIVE PO LIQD
237.0000 mL | Freq: Two times a day (BID) | ORAL | Status: DC
  Administered 2020-11-23 – 2020-11-24 (×3): 237 mL via ORAL

## 2020-11-23 MED ORDER — SODIUM CHLORIDE 1 G PO TABS
2.0000 g | ORAL_TABLET | Freq: Two times a day (BID) | ORAL | Status: DC
  Administered 2020-11-23 – 2020-11-24 (×3): 2 g via ORAL
  Filled 2020-11-23 (×4): qty 2

## 2020-11-23 MED ORDER — GABAPENTIN 300 MG PO CAPS
300.0000 mg | ORAL_CAPSULE | Freq: Three times a day (TID) | ORAL | Status: DC
  Administered 2020-11-23 – 2020-11-24 (×5): 300 mg via ORAL
  Filled 2020-11-23 (×5): qty 1

## 2020-11-23 NOTE — Progress Notes (Signed)
PROGRESS NOTE    Alvin Benton  WUG:[id number] DOB: 10/20/1951 DOA: 11/21/2020 PCP: Pcp, No   Chief Complain: Weakness  Brief Narrative: Patient is a 70 year old male with history of COPD, chronic alcohol abuse who presented from home with complaints of weakness, poor balance.  He stated that he quit drinking about 2 weeks ago because he was not feeling well.  He was unable to ambulate, not eating or drinking for a week.  On presentation he was hypotensive but responded to 2 L of normal saline bolus.  On presentation he was found to have sodium of 144, creatinine of 2.8.  Lactate was normal.  He was admitted for the management of severe AKI, hyponatremia is most likely secondary to severe dehydration from decreased oral intake.  Patient was admitted under PCCM service.  Transferred to [facility] service on 11/22/2020.  Patient remains hemodynamically stable.  Sodium level is improving.  PT/OT recommended skilled nursing facility on discharge.  He is medically stable for discharge to skilled nursing facility as soon as bed is available.  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  Responded to IV fluids.  Continue gentle IV fluids for today.  Hyponatremia: Most likely secondary to hypovolemic hyponatremia from decreased oral intake or beer potomania.  Sodium is improving.  Continue to monitor.  Target of correction is not more than 8 mEq per day.  Sodium is 125 today.  Check BMP tomorrow.  Normal cortisol, uric acid.  Urine sodium and urine osmolality not obtained yet.  Started on salt tablets  AKI: Because of poor oral intake.  Resolved with IV  fluids. He was also taking lisinopril-hydrochlorothiazide at home.  Chronic alcohol abuse: No evidence of withdrawal.  He drinks 6 packs of beer a day for last several years ,he  stated that he quit drinking about 2 week ago.  Continue thiamine folic acid.  Continue to monitor for withdrawal.  Hypertension: Currently blood pressure stable.  He was hypotensive on presentation.  Antihypertensives on hold.  History of COPD: Currently not in exacerbation.  Continue bronchodilators as needed.  On room air  Smoker: Smokes a pack a day.  Counseled cessation.  Continue nicotine patch  Paroxysmal A. fib: Not on anticoagulation.  Currently in normal sinus rhythm.CHA2DS2-VASc score is at least 2 with age and HTN.  Due to his chronic alcohol issues, is not safe to put him on anticoagulation emergently.  I will recommend to follow-up with his primary care physician/cardiology as an outpatient for discussion of initiation of coagulation if needed.  Hypokalemia: Supplemented with potassium.  Back pain/debility/deconditioning: History of lumbar spine compression fractures.  He was planned for surgery but delayed due to Covid pandemic.  He complains of pain on his bilateral feet most likely from sciatica.  Started on gabapentin.   Continue supportive care.  PT/OT evaluation done and recommended skilled nursing facility on discharge.  TOC consulted and following.          DVT prophylaxis:Lovenox Code Status: DNR Family Communication: None at bedside Status is: Inpatient  Remains inpatient appropriate because:Inpatient level of care appropriate due to severity of illness   Dispo:  The patient is from: Home              Anticipated d/c is to:SnF              Anticipated d/c date is: 1-2 days              Patient currently is medically stable for discharge   Difficult to place patient : No     Consultants: PCCM  Procedures:None  Antimicrobials:  Anti-infectives (From admission, onward)    None      Subjective: Patient seen and examined the bedside this morning.  Hemodynamically stable.  Complains of pain in the bilateral feet and lower back today.  Objective: Vitals:   11/22/20 2309 11/23/20 0310 11/23/20 0440 11/23/20 0751  BP: 133/61 (!) 128/59  (!) 145/85  Pulse: 66 65  76  Resp: 16 16 19 17   Temp: 97.9 F (36.6 C) 98 F (36.7 C)  97.8 F (36.6 C)  TempSrc: Oral Oral  Oral  SpO2: 100% 98%    Weight:  84.8 kg    Height:        Intake/Output Summary (Last 24 hours) at 11/23/2020 0804 Last data filed at 11/23/2020 8413 Gross per 24 hour  Intake 3687.35 ml  Output 2730 ml  Net 957.35 ml   Filed Weights   11/21/20 1049 11/23/20 0310  Weight: 84.8 kg 84.8 kg    Examination: General exam: Appears calm and comfortable ,Not in distress,average built HEENT:PERRL,Oral mucosa moist, Ear/Nose normal on gross exam Respiratory system: Bilateral equal air entry, normal vesicular breath sounds, no wheezes or crackles  Cardiovascular system: S1 & S2 heard, RRR. No JVD, murmurs, rubs, gallops or clicks. Gastrointestinal system: Abdomen is nondistended, soft and nontender. No organomegaly or masses felt. Normal bowel sounds heard. Central nervous system: Alert and oriented. No focal neurological deficits. Extremities: No edema, no clubbing ,no cyanosis Skin: No rashes, lesions or ulcers,no icterus ,no pallor  Data Reviewed: I have personally reviewed following labs and imaging studies  CBC: Recent Labs  Lab 11/21/20 1057 11/22/20 0400  WBC 13.6* 8.3  NEUTROABS 10.2*  --   HGB 16.1 14.0  HCT 43.3 38.6*  MCV 88.5 90.6  PLT 443* 336   Basic Metabolic Panel: Recent Labs  Lab 11/21/20 2122 11/22/20 0021 11/22/20 0400 11/22/20 0923 11/23/20 0319  NA 120* 122* 123* 123* 125*  K 3.4* 3.3* 3.9 3.2* 3.4*  CL 89* 91* 91* 90* 95*  CO2 20* 22 19* 22 21*  GLUCOSE 79 111* 86 106* 96  BUN 43* 40* 36* 32* 21  CREATININE 1.89* 1.52* 1.23 1.28* 0.96  CALCIUM 8.2*  8.2* 8.7* 8.5* 8.4*  MG  --   --  2.1  --   --   PHOS  --   --  3.0  --   --    GFR: Estimated Creatinine Clearance: 74.7 mL/min (by C-G formula based on SCr of 0.96 mg/dL). Liver Function Tests: Recent Labs  Lab 11/21/20 1057  AST 39  ALT 26  ALKPHOS 47  BILITOT 2.1*  PROT 6.7  ALBUMIN 4.1   No results for input(s): LIPASE, AMYLASE in the last 168 hours. No results for input(s): AMMONIA in the last 168 hours. Coagulation Profile: No results for input(s): INR, PROTIME in the last 168 hours. Cardiac Enzymes: No results for input(s): CKTOTAL, CKMB, CKMBINDEX, TROPONINI in the last 168 hours. BNP (last 3 results) No results for input(s): PROBNP in the last 8760 hours. HbA1C: No results for  input(s): HGBA1C in the last 72 hours. CBG: No results for input(s): GLUCAP in the last 168 hours. Lipid Profile: No results for input(s): CHOL, HDL, LDLCALC, TRIG, CHOLHDL, LDLDIRECT in the last 72 hours. Thyroid Function Tests: Recent Labs    11/22/20 1341  TSH 0.323*   Anemia Panel: No results for input(s): VITAMINB12, FOLATE, FERRITIN, TIBC, IRON, RETICCTPCT in the last 72 hours. Sepsis Labs: Recent Labs  Lab 11/21/20 1220  LATICACIDVEN 1.5    Recent Results (from the past 240 hour(s))  Blood culture (routine x 2)     Status: None (Preliminary result)   Collection Time: 11/21/20 11:28 AM   Specimen: BLOOD LEFT HAND  Result Value Ref Range Status   Specimen Description BLOOD LEFT HAND  Final   Special Requests   Final    BOTTLES DRAWN AEROBIC AND ANAEROBIC Blood Culture results may not be optimal due to an inadequate volume of blood received in culture bottles   Culture   Final    NO GROWTH < 24 HOURS Performed at [facility], [address], [place], [place] [address]    Report Status PENDING  Incomplete  SARS Coronavirus 2 by RT PCR (hospital order, performed in [facility] hospital lab) Nasopharyngeal Nasopharyngeal Swab     Status: None   Collection Time:  11/21/20 11:33 AM   Specimen: Nasopharyngeal Swab  Result Value Ref Range Status   SARS Coronavirus 2 NEGATIVE NEGATIVE Final    Comment: (NOTE) SARS-CoV-2 target nucleic acids are NOT DETECTED.  The SARS-CoV-2 RNA is generally detectable in upper and lower respiratory specimens during the acute phase of infection. The lowest concentration of SARS-CoV-2 viral copies this assay can detect is 250 copies / mL. A negative result does not preclude SARS-CoV-2 infection and should not be used as the sole basis for treatment or other patient management decisions.  A negative result may occur with improper specimen collection / handling, submission of specimen other than nasopharyngeal swab, presence of viral mutation(s) within the areas targeted by this assay, and inadequate number of viral copies (<250 copies / mL). A negative result must be combined with clinical observations, patient history, and epidemiological information.  Fact Sheet for Patients:   BoilerBrush.com.cy  Fact Sheet for Healthcare Providers: https://pope.com/  This test is not yet approved or  cleared by the [place] FDA and has been authorized for detection and/or diagnosis of SARS-CoV-2 by FDA under an Emergency Use Authorization (EUA).  This EUA will remain in effect (meaning this test can be used) for the duration of the COVID-19 declaration under Section 564(b)(1) of the Act, 21 U.S.C. section 360bbb-3(b)(1), unless the authorization is terminated or revoked sooner.  Performed at [facility], [address], [place], [place] [address]   Blood culture (routine x 2)     Status: None (Preliminary result)   Collection Time: 11/21/20 12:42 PM   Specimen: BLOOD  Result Value Ref Range Status   Specimen Description BLOOD SITE NOT SPECIFIED  Final   Special Requests   Final    BOTTLES DRAWN AEROBIC AND ANAEROBIC Blood Culture results may not be optimal due  to an inadequate volume of blood received in culture bottles   Culture   Final    NO GROWTH < 24 HOURS Performed at [facility], [address], [place], [place] [address]    Report Status PENDING  Incomplete         Radiology Studies: No results found.  Scheduled Meds: . docusate sodium  100 mg Oral BID  . enoxaparin (LOVENOX) injection  40 mg Subcutaneous Q24H  . folic acid  1 mg Oral Daily  . lidocaine  3 patch Transdermal Q24H  . multivitamin with minerals  1 tablet Oral Daily  . nicotine  21 mg Transdermal Daily  . sodium chloride flush  3 mL Intravenous Q12H  . sodium chloride  2 g Oral BID WC  . thiamine  100 mg Oral Daily   Or  . thiamine  100 mg Intravenous Daily   Continuous Infusions: . sodium chloride 125 mL/hr at 11/23/20 0448     LOS: 2 days    Time spent:25 mins. More than 50% of that time was spent in counseling and/or coordination of care.      Burnadette Pop, MD [facility] P1/27/2022, 8:04 AM

## 2020-11-23 NOTE — Progress Notes (Signed)
Initial Nutrition Assessment  DOCUMENTATION CODES:   Not applicable  INTERVENTION:  Monitor magnesium, potassium, and phosphorus daily for at least 3 days, MD to replete as needed, as pt is at risk for refeeding syndrome given poor po x1 week.  -Ensure Enlive po BID, each supplement provides 350 kcal and 20 grams of protein -Continue MVI with minerals daily   NUTRITION DIAGNOSIS:   Inadequate oral intake related to nausea,poor appetite,vomiting as evidenced by per patient/family report.    GOAL:   Patient will meet greater than or equal to 90% of their needs    MONITOR:   PO intake,Supplement acceptance,Labs,Weight trends,I & O's  REASON FOR ASSESSMENT:   Consult Other (Comment) ("nutritional goals")  ASSESSMENT:   Pt admitted with hypovolemic hyponatremia, likely 2/2 poor po intake and vomiting. PMH includes heavy EtOH abuse, COPD, afib, diverticulitis, GERD, HTN, IBS.  Pt states that he quit drinking ~ 2 weeks ago due to "not feeling well."  Per CCM, no evidence of withdrawal.   Pt reports having a poor appetite 2/2 N/V for ~ 1 week PTA. Pt reports this is improving -- 100% meal completion x1 recorded meal. Will order oral nutrition supplements to provide pt with additional kcals/protein in case appetite decreases again.  Reviewed weight history; no significant wt changes noted.   UOP: x24 hours  Labs: Na 125 (L, trending up), K+ 3.4 (L, up from yesterday) Medications: colace, folvite, thiamine, mvi with minerals, sodium chloride tabs  NUTRITION - FOCUSED PHYSICAL EXAM:  Unable to perform at this time due to RD working remotely.   Diet Order:   Diet Order            Diet Heart Room service appropriate? Yes; Fluid consistency: Thin  Diet effective now                 EDUCATION NEEDS:   No education needs have been identified at this time  Skin:  Skin Assessment: Reviewed RN Assessment  Last BM:  11/22/20  Height:   Ht Readings from Last  1 Encounters:  11/21/20 5\' 9"  (1.753 m)    Weight:   Wt Readings from Last 1 Encounters:  11/23/20 84.8 kg    BMI:  Body mass index is 27.61 kg/m.  Estimated Nutritional Needs:   Kcal:  1950-2150  Protein:  95-110 grams  Fluid:  >2L    11/25/20, MS, RD, LDN RD pager number and weekend/on-call pager number located in Amion.

## 2020-11-23 NOTE — Plan of Care (Signed)

## 2020-11-23 NOTE — TOC Progression Note (Addendum)
Transition of Care [facility]) - Progression Note    Patient Details  Name: Alvin Benton MRN: [id number] Date of Birth: 1951/01/21  Transition of Care [facility]) CM/SW [place], [place] Phone Number: 11/23/2020, 11:02 AM  Clinical Narrative:   CSW met with patient to provide bed offers. Patient to review and will update CSW with choice. CSW to follow.  UPDATE 4:05 PM: CSW attempted to meet with patient to discuss facility choice, and patient said he did not feel well and absolutely could not make a decision at this time. Patient said he needed to talk to someone about other facility choices, and when CSW asked about other facilities to contact, patient said he couldn't talk anymore. CSW to follow back up with patient.    Expected Discharge Plan: Skilled Nursing Facility Barriers to Discharge: Continued Medical Work up,Insurance Authorization  Expected Discharge Plan and Services Expected Discharge Plan: [place] arrangements for the past 2 months: Apartment                                       Social Determinants of Health (SDOH) Interventions    Readmission Risk Interventions No flowsheet data found.

## 2020-11-24 LAB — BASIC METABOLIC PANEL
Anion gap: 11 (ref 5–15)
BUN: 12 mg/dL (ref 8–23)
CO2: 24 mmol/L (ref 22–32)
Calcium: 8.9 mg/dL (ref 8.9–10.3)
Chloride: 96 mmol/L — ABNORMAL LOW (ref 98–111)
Creatinine, Ser: 0.81 mg/dL (ref 0.61–1.24)
GFR, Estimated: >60 mL/min (ref >60)
Glucose, Bld: 93 mg/dL (ref 70–99)
Potassium: 3.9 mmol/L (ref 3.5–5.1)
Sodium: 131 mmol/L — ABNORMAL LOW (ref 135–145)

## 2020-11-24 LAB — T3, FREE: T3, Free: 2.4 pg/mL (ref 2.0–4.4)

## 2020-11-24 LAB — MRSA PCR SCREENING: MRSA by PCR: NEGATIVE

## 2020-11-24 MED ORDER — NICOTINE 21 MG/24HR TD PT24: 21.0000 mg | MEDICATED_PATCH | Freq: Every day | TRANSDERMAL | 0 refills | Status: DC

## 2020-11-24 MED ORDER — FOLIC ACID 1 MG PO TABS: 1.0000 mg | ORAL_TABLET | Freq: Every day | ORAL | 1 refills | Status: DC

## 2020-11-24 MED ORDER — OXYCODONE HCL 5 MG PO TABS
5.0000 mg | ORAL_TABLET | Freq: Four times a day (QID) | ORAL | 0 refills | Status: DC | PRN
Start: 2020-11-24 — End: 2021-05-29

## 2020-11-24 MED ORDER — SODIUM CHLORIDE 1 G PO TABS: 1.0000 g | ORAL_TABLET | Freq: Two times a day (BID) | ORAL | 0 refills | Status: AC

## 2020-11-24 MED ORDER — THIAMINE HCL 100 MG PO TABS: 100.0000 mg | ORAL_TABLET | Freq: Every day | ORAL | Status: DC

## 2020-11-24 MED ORDER — GABAPENTIN 300 MG PO CAPS: 300.0000 mg | ORAL_CAPSULE | Freq: Three times a day (TID) | ORAL | Status: DC

## 2020-11-24 MED ORDER — LIDOCAINE 5 % EX PTCH: 3.0000 | MEDICATED_PATCH | CUTANEOUS | 0 refills | Status: DC

## 2020-11-24 NOTE — TOC Transition Note (Signed)
Transition of Care [facility]) - CM/SW Discharge Note   Patient Details  Name: Alfio Loescher MRN: [id number] Date of Birth: 04-12-1951  Transition of Care [facility]) CM/SW Contact:  Baldemar Lenis, LCSW Phone Number: 11/24/2020, 2:38 PM   Clinical Narrative:   Nurse to call report to [phone].    Final next level of care: Skilled Nursing Facility Barriers to Discharge: Barriers Resolved   Patient Goals and CMS Choice        Discharge Placement              Patient chooses bed at:  ([facility]) Patient to be transferred to facility by: [facility] Name of family member notified: Self Patient and family notified of of transfer: 11/24/20  Discharge Plan and Services                                     Social Determinants of Health (SDOH) Interventions     Readmission Risk Interventions No flowsheet data found.

## 2020-11-24 NOTE — Progress Notes (Incomplete)
PROGRESS NOTE    Alvin Benton  WGN:[id number] DOB: 1950/12/14 DOA: 11/21/2020 PCP: Pcp, No   Chief Complain: Weakness  Brief Narrative: Patient is a 70 year old male with history of COPD, chronic alcohol abuse who presented from home with complaints of weakness, poor balance.  He stated that he quit drinking about 2 weeks ago because he was not feeling well.  He was unable to ambulate, not eating or drinking for a week.  On presentation he was hypotensive but responded to 2 L of normal saline bolus.  On presentation he was found to have sodium of 144, creatinine of 2.8.  Lactate was normal.  He was admitted for the management of severe AKI, hyponatremia is most likely secondary to severe dehydration from decreased oral intake.  Patient was admitted under PCCM service.  Transferred to [facility] service on 11/22/2020.  Patient remains hemodynamically stable.  Sodium level is improving.  PT/OT recommended skilled nursing facility on discharge.  He is medically stable for discharge to skilled nursing facility as soon as bed is available.  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  Responded to IV fluids. IV fluids now stopped  Hyponatremia: Most likely secondary to hypovolemic hyponatremia from decreased oral intake or beer potomania.  Sodium is improving. Sodium is 131 today.    Normal cortisol, uric acid.Started on salt tablets, which will be continued for total of 7 days  AKI: Because of poor oral intake.  Resolved with IV fluids. He was also taking lisinopril-hydrochlorothiazide at home.  Chronic alcohol abuse: No evidence of  withdrawal.  He drinks 6 packs of beer a day for last several years ,he  stated that he quit drinking about 2 week ago.  Continue thiamine folic acid.  Continue to monitor for withdrawal.  Abnormal thyroid function tests: Mild low  TSH, mildly  elevated T4, normal T3. Suspicion for hyperthyroidism. Discussed with endocrinology, Dr. Sharl Ma  Hypertension: Currently blood pressure stable.  He was hypotensive on presentation.  Antihypertensives on hold.  History of COPD: Currently not in exacerbation.  Continue bronchodilators as needed.  On room air  Smoker: Smokes a pack a day.  Counseled cessation.  Continue nicotine patch  Paroxysmal A. fib: All his EKGs on the chart show normal sinus rhythm .not on anticoagulation.  Currently in normal sinus rhythm.CHA2DS2-VASc score is at least 2 with age and HTN.  Due to his chronic alcohol issues, is not safe to put him on anticoagulation emergently.  I will recommend to follow-up with his primary care physician/cardiology as an outpatient for discussion of initiation of coagulation if needed.  Hypokalemia: Supplemented with potassium.  Back pain/debility/deconditioning: History of lumbar spine compression fractures.  He was planned for surgery but delayed due to Covid pandemic.  He complains of pain on his bilateral feet most likely from sciatica.  Started on gabapentin.   Continue supportive care.  PT/OT evaluation done and recommended skilled nursing facility on discharge.  TOC consulted and following.   Nutrition Problem: Inadequate oral intake Etiology: nausea,poor appetite,vomiting      DVT prophylaxis:Lovenox Code Status: DNR Family Communication: None at bedside Status is: Inpatient  Remains inpatient appropriate because:Inpatient level of care appropriate due to severity of illness   Dispo: The patient is from: Home              Anticipated d/c is to:[facility]              Anticipated d/c date is: 1-2 days              Patient currently is  medically stable for discharge   Difficult to place patient : No     Consultants: PCCM  Procedures:None  Antimicrobials:  Anti-infectives (From admission, onward)   None      Subjective: Patient seen and examined the bedside this morning.  Hemodynamically stable.  Complains of pain in the bilateral feet and lower back today.  Objective: Vitals:   11/23/20 1700 11/23/20 1951 11/23/20 2343 11/24/20 0342  BP:  (!) 142/70 (!) 111/52 140/75  Pulse:  71  72  Resp: 16 16  18   Temp:  98.2 F (36.8 C) 98.3 F (36.8 C) (!) 97.5 F (36.4 C)  TempSrc:  Oral Axillary Oral  SpO2:  97% 97% 96%  Weight:      Height:        Intake/Output Summary (Last 24 hours) at 11/24/2020 0817 Last data filed at 11/24/2020 0522 Gross per 24 hour  Intake 977 ml  Output 3700 ml  Net -2723 ml   Filed Weights   11/21/20 1049 11/23/20 0310  Weight: 84.8 kg 84.8 kg    Examination: General exam: Appears calm and comfortable ,Not in distress,average built HEENT:PERRL,Oral mucosa moist, Ear/Nose normal on gross exam Respiratory system: Bilateral equal air entry, normal vesicular breath sounds, no wheezes or crackles  Cardiovascular system: S1 & S2 heard, RRR. No JVD, murmurs, rubs, gallops or clicks. Gastrointestinal system: Abdomen is nondistended, soft and nontender. No organomegaly or masses felt. Normal bowel sounds heard. Central nervous system: Alert and oriented. No focal neurological deficits. Extremities: No edema, no clubbing ,no cyanosis Skin: No rashes, lesions or ulcers,no icterus ,no pallor  Data Reviewed: I have personally reviewed following labs and imaging studies  CBC: Recent Labs  Lab 11/21/20 1057 11/22/20 0400  WBC 13.6* 8.3  NEUTROABS 10.2*  --   HGB 16.1 14.0  HCT 43.3 38.6*  MCV 88.5 90.6  PLT 443* 336   Basic Metabolic Panel: Recent Labs  Lab 11/22/20 0021 11/22/20 0400 11/22/20 0923 11/23/20 0319 11/24/20 0602  NA 122* 123* 123* 125* 131*  K 3.3* 3.9  3.2* 3.4* 3.9  CL 91* 91* 90* 95* 96*  CO2 22 19* 22 21* 24  GLUCOSE 111* 86 106* 96 93  BUN 40* 36* 32* 21 12  CREATININE 1.52* 1.23 1.28* 0.96 0.81  CALCIUM 8.2* 8.7* 8.5* 8.4* 8.9  MG  --  2.1  --   --   --   PHOS  --  3.0  --   --   --    GFR: Estimated Creatinine Clearance: 88.5 mL/min (by C-G formula based on SCr of 0.81 mg/dL). Liver Function Tests: Recent Labs  Lab 11/21/20 1057  AST 39  ALT 26  ALKPHOS 47  BILITOT 2.1*  PROT 6.7  ALBUMIN 4.1   No results for input(s): LIPASE, AMYLASE in the last 168 hours. No results for input(s): AMMONIA in the last 168 hours. Coagulation Profile: No results for input(s): INR, PROTIME in the last 168 hours. Cardiac Enzymes: No results for input(s): CKTOTAL, CKMB, CKMBINDEX, TROPONINI in the last 168 hours. BNP (last  3 results) No results for input(s): PROBNP in the last 8760 hours. HbA1C: No results for input(s): HGBA1C in the last 72 hours. CBG: No results for input(s): GLUCAP in the last 168 hours. Lipid Profile: No results for input(s): CHOL, HDL, LDLCALC, TRIG, CHOLHDL, LDLDIRECT in the last 72 hours. Thyroid Function Tests: Recent Labs    11/22/20 1341 11/23/20 0803 11/23/20 0829  TSH 0.323*  --   --   FREET4  --  1.30*  --   T3FREE  --   --  2.4   Anemia Panel: No results for input(s): VITAMINB12, FOLATE, FERRITIN, TIBC, IRON, RETICCTPCT in the last 72 hours. Sepsis Labs: Recent Labs  Lab 11/21/20 1220  LATICACIDVEN 1.5    Recent Results (from the past 240 hour(s))  Blood culture (routine x 2)     Status: None (Preliminary result)   Collection Time: 11/21/20 11:28 AM   Specimen: BLOOD LEFT HAND  Result Value Ref Range Status   Specimen Description BLOOD LEFT HAND  Final   Special Requests   Final    BOTTLES DRAWN AEROBIC AND ANAEROBIC Blood Culture results may not be optimal due to an inadequate volume of blood received in culture bottles   Culture   Final    NO GROWTH 2 DAYS Performed at [facility], [address], [place], [place] [address]    Report Status PENDING  Incomplete  SARS Coronavirus 2 by RT PCR (hospital order, performed in [facility] hospital lab) Nasopharyngeal Nasopharyngeal Swab     Status: None   Collection Time: 11/21/20 11:33 AM   Specimen: Nasopharyngeal Swab  Result Value Ref Range Status   SARS Coronavirus 2 NEGATIVE NEGATIVE Final    Comment: (NOTE) SARS-CoV-2 target nucleic acids are NOT DETECTED.  The SARS-CoV-2 RNA is generally detectable in upper and lower respiratory specimens during the acute phase of infection. The lowest concentration of SARS-CoV-2 viral copies this assay can detect is 250 copies / mL. A negative result does not preclude SARS-CoV-2 infection and should not be used as the sole basis for treatment or other patient management decisions.  A negative result may occur with improper specimen collection / handling, submission of specimen other than nasopharyngeal swab, presence of viral mutation(s) within the areas targeted by this assay, and inadequate number of viral copies (<250 copies / mL). A negative result must be combined with clinical observations, patient history, and epidemiological information.  Fact Sheet for Patients:   BoilerBrush.com.cy  Fact Sheet for Healthcare Providers: https://pope.com/  This test is not yet approved or  cleared by the [place] FDA and has been authorized for detection and/or diagnosis of SARS-CoV-2 by FDA under an Emergency Use Authorization (EUA).  This EUA will remain in effect (meaning this test can be used) for the duration of the COVID-19 declaration under Section 564(b)(1) of the Act, 21 U.S.C. section 360bbb-3(b)(1), unless the authorization is terminated or revoked sooner.  Performed at [facility], [address] [place], [place] [address]   Blood culture (routine x 2)     Status: None (Preliminary result)    Collection Time: 11/21/20 12:42 PM   Specimen: BLOOD  Result Value Ref Range Status   Specimen Description BLOOD SITE NOT SPECIFIED  Final   Special Requests   Final    BOTTLES DRAWN AEROBIC AND ANAEROBIC Blood Culture results may not be optimal due to an inadequate volume of blood received in culture bottles   Culture   Final  NO GROWTH 2 DAYS Performed at [facility], [address], [place], [place] [address]    Report Status PENDING  Incomplete  MRSA PCR Screening     Status: None   Collection Time: 11/23/20  8:37 PM   Specimen: Nasopharyngeal  Result Value Ref Range Status   MRSA by PCR NEGATIVE NEGATIVE Final    Comment:        The GeneXpert MRSA Assay (FDA approved for NASAL specimens only), is one component of a comprehensive MRSA colonization surveillance program. It is not intended to diagnose MRSA infection nor to guide or monitor treatment for MRSA infections. Performed at [facility], [address] [place], [place] [address]          Radiology Studies: No results found.      Scheduled Meds: . docusate sodium  100 mg Oral BID  . enoxaparin (LOVENOX) injection  40 mg Subcutaneous Q24H  . feeding supplement  237 mL Oral BID BM  . folic acid  1 mg Oral Daily  . gabapentin  300 mg Oral TID  . lidocaine  3 patch Transdermal Q24H  . multivitamin with minerals  1 tablet Oral Daily  . nicotine  21 mg Transdermal Daily  . sodium chloride flush  3 mL Intravenous Q12H  . sodium chloride  2 g Oral BID WC  . thiamine  100 mg Oral Daily   Or  . thiamine  100 mg Intravenous Daily   Continuous Infusions:    LOS: 3 days    Time spent:25 mins. More than 50% of that time was spent in counseling and/or coordination of care.      Burnadette Pop, MD [facility] P1/28/2022, 8:17 AM

## 2020-11-24 NOTE — Progress Notes (Signed)
Pt's IV removed; site is clean dry and intact. Discharge instructions were reviewed with pt. and placed in discharge envelope for receiving facility. Personal belongings were packed by nursing staff and sent with pt. Pt. transported via stretcher to [facility] to [facility]. Nurse attempted report x2 but unsuccessful.

## 2020-11-24 NOTE — Discharge Summary (Signed)
Physician Discharge Summary  Alvin Benton TXM:[id number] DOB: 1950-11-19 DOA: 11/21/2020  PCP: Pcp, No  Admit date: 11/21/2020 Discharge date: 11/24/2020  Admitted From: Home Disposition:  SNF  Discharge Condition:Stable CODE STATUS:DNR Diet recommendation: Heart Healthy    Brief/Interim Summary: Patient is a 70 year old male with history of COPD, chronic alcohol abuse who presented from home with complaints of weakness, poor balance.  He stated that he quit drinking about 2 weeks ago because he was not feeling well.  He was unable to ambulate, not eating or drinking for a week.  On presentation he was hypotensive but responded to 2 L of normal saline bolus.  On presentation he was found to have sodium of 144, creatinine of 2.8.  Lactate was normal.  He was admitted for the management of severe AKI, hyponatremia is most likely secondary to severe dehydration from decreased oral intake.  Patient was admitted under PCCM service.  Transferred to [facility] service on 11/22/2020.  Patient remains hemodynamically stable.  Sodium level is improving.  PT/OT recommended skilled nursing facility on discharge.  Hemodynamically stable for discharge today.   Following problems were addressed during his hospitalization:  Severe dehydration: Not feeling well for last 2 weeks before admission, not eating or drinking much either.  Responded to IV fluids. IV fluids now stopped  Hyponatremia: Most likely secondary to hypovolemic hyponatremia from decreased oral intake or beer potomania.  Sodium is improving. Sodium is 131 today.    Normal cortisol, uric acid.Started on salt tablets, which will be continued for total of 5 days.Check BMP in a week  AKI: Because of poor oral intake.  Resolved with IV fluids. He was also taking lisinopril-hydrochlorothiazide at home.  Chronic alcohol abuse: No evidence of withdrawal.  He drinks 6 packs of beer a day for last several years ,he  stated that he quit drinking about 2 week  ago.  Continue thiamine folic acid.    Abnormal thyroid function tests: Mild low  TSH, mildly  elevated T4, normal T3. Suspicion for hyperthyroidism. Discussed with endocrinology, Dr. Sharl Ma  Hypertension: Currently blood pressure stable.  He was hypotensive on presentation.  Antihypertensives on hold.  History of COPD: Currently not in exacerbation.  Continue bronchodilators as needed.  On room air  Abnormal thyroid function panel: TSH is very mildly suppressed, very mildly elevated T4 but T3 is normal.  I discussed this finding with Dr. Kerr,Endocrinology.  He recommends to follow-up as an outpatient and repeat TFT in 6 weeks.  No need to start on any treatment for now.  Smoker: Smokes a pack a day.  Counseled cessation.  Continue nicotine patch  Paroxysmal A. fib: All his EKGs on the chart show normal sinus rhythm .not on anticoagulation.  Currently in normal sinus rhythm.CHA2DS2-VASc score is at least 2 with age and HTN.  Due to his chronic alcohol issues, is not safe to put him on anticoagulation emergently.  I will recommend to follow-up with his primary care physician/cardiology as an outpatient for discussion of initiation of coagulation if needed.  Hypokalemia: Supplemented with potassium.  Back pain/debility/deconditioning: History of lumbar spine compression fractures.  He was planned for surgery but delayed due to Covid pandemic.  He complains of pain on his bilateral feet most likely from sciatica.  Started on gabapentin.  Continue supportive care.  PT/OT evaluation done and recommended skilled nursing facility on discharge.     Discharge Diagnoses:  Principal Problem:   Dehydration with hyponatremia Active Problems:   Tobacco use disorder  Atrial fibrillation (HCC)   Essential hypertension   Alcohol dependence in early full remission (HCC)   Chronic back pain greater than 3 months duration   AKI (acute kidney injury) [facility])    Discharge Instructions  Discharge  Instructions    Diet - low sodium heart healthy   Complete by: As directed    Discharge instructions   Complete by: As directed    1) Take prescribed medications as instructed 2)Do a BMP test in a week 3)Follow up with pain management/neurosurgery as an outpatient for the management of her back pain. 4)Quit alcohol 5)Do a Thyroid function test in 6 weeks.  Follow-up with endocrinology as an outpatient . Name  and number of the provider has been attached.   Increase activity slowly   Complete by: As directed      Allergies as of 11/24/2020   No Known Allergies     Medication List    STOP taking these medications   amoxicillin-clavulanate 875-125 MG tablet Commonly known as: AUGMENTIN   lisinopril-hydrochlorothiazide 20-25 MG tablet Commonly known as: ZESTORETIC     TAKE these medications   acetaminophen 325 MG tablet Commonly known as: TYLENOL Take 650 mg by mouth every 6 (six) hours as needed for mild pain or headache.   Albuterol Sulfate 108 (90 Base) MCG/ACT Aepb Commonly known as: PROAIR RESPICLICK Inhale 2 puffs into the lungs every 6 (six) hours as needed for shortness of breath.   folic acid 1 MG tablet Commonly known as: FOLVITE Take 1 tablet (1 mg total) by mouth daily. Start taking on: November 25, 2020   gabapentin 300 MG capsule Commonly known as: NEURONTIN Take 1 capsule (300 mg total) by mouth 3 (three) times daily.   lidocaine 5 % Commonly known as: LIDODERM Place 3 patches onto the skin daily. Remove & Discard patch within 12 hours or as directed by MD   nicotine 21 mg/24hr patch Commonly known as: NICODERM CQ - dosed in mg/24 hours Place 1 patch (21 mg total) onto the skin daily. Start taking on: November 25, 2020   oxyCODONE 5 MG immediate release tablet Commonly known as: Oxy IR/ROXICODONE Take 1 tablet (5 mg total) by mouth every 6 (six) hours as needed for moderate pain.   sodium chloride 1 g tablet Take 1 tablet (1 g total) by mouth 2  (two) times daily with a meal for 5 days.   thiamine 100 MG tablet Take 1 tablet (100 mg total) by mouth daily. Start taking on: November 25, 2020       Follow-up Information    Talmage Coin, MD. Schedule an appointment as soon as possible for a visit in 6 week(s).   Specialty: Endocrinology Contact information: [address] [place] [address] [phone]              No Known Allergies  Consultations:  None   Procedures/Studies: No results found.    Subjective: Patient seen and examined the bedside this morning.  Hemodynamically stable for discharge today.  Discharge Exam: Vitals:   11/24/20 0342 11/24/20 0837  BP: 140/75 (!) 135/59  Pulse: 72 74  Resp: 18 17  Temp: (!) 97.5 F (36.4 C) 98.4 F (36.9 C)  SpO2: 96% 98%   Vitals:   11/23/20 1951 11/23/20 2343 11/24/20 0342 11/24/20 0837  BP: (!) 142/70 (!) 111/52 140/75 (!) 135/59  Pulse: 71  72 74  Resp: 16  18 17   Temp: 98.2 F (36.8 C) 98.3 F (  36.8 C) (!) 97.5 F (36.4 C) 98.4 F (36.9 C)  TempSrc: Oral Axillary Oral Oral  SpO2: 97% 97% 96% 98%  Weight:      Height:        General: Pt is alert, awake, not in acute distress Cardiovascular: RRR, S1/S2 +, no rubs, no gallops Respiratory: CTA bilaterally, no wheezing, no rhonchi Abdominal: Soft, NT, ND, bowel sounds + Extremities: no edema, no cyanosis    The results of significant diagnostics from this hospitalization (including imaging, microbiology, ancillary and laboratory) are listed below for reference.     Microbiology: Recent Results (from the past 240 hour(s))  Blood culture (routine x 2)     Status: None (Preliminary result)   Collection Time: 11/21/20 11:28 AM   Specimen: BLOOD LEFT HAND  Result Value Ref Range Status   Specimen Description BLOOD LEFT HAND  Final   Special Requests   Final    BOTTLES DRAWN AEROBIC AND ANAEROBIC Blood Culture results may not be optimal due to an inadequate volume of blood  received in culture bottles   Culture   Final    NO GROWTH 3 DAYS Performed at [facility], [address] [place], [place] [address]    Report Status PENDING  Incomplete  SARS Coronavirus 2 by RT PCR (hospital order, performed in [facility] hospital lab) Nasopharyngeal Nasopharyngeal Swab     Status: None   Collection Time: 11/21/20 11:33 AM   Specimen: Nasopharyngeal Swab  Result Value Ref Range Status   SARS Coronavirus 2 NEGATIVE NEGATIVE Final    Comment: (NOTE) SARS-CoV-2 target nucleic acids are NOT DETECTED.  The SARS-CoV-2 RNA is generally detectable in upper and lower respiratory specimens during the acute phase of infection. The lowest concentration of SARS-CoV-2 viral copies this assay can detect is 250 copies / mL. A negative result does not preclude SARS-CoV-2 infection and should not be used as the sole basis for treatment or other patient management decisions.  A negative result may occur with improper specimen collection / handling, submission of specimen other than nasopharyngeal swab, presence of viral mutation(s) within the areas targeted by this assay, and inadequate number of viral copies (<250 copies / mL). A negative result must be combined with clinical observations, patient history, and epidemiological information.  Fact Sheet for Patients:   BoilerBrush.com.cy  Fact Sheet for Healthcare Providers: https://pope.com/  This test is not yet approved or  cleared by the [place] FDA and has been authorized for detection and/or diagnosis of SARS-CoV-2 by FDA under an Emergency Use Authorization (EUA).  This EUA will remain in effect (meaning this test can be used) for the duration of the COVID-19 declaration under Section 564(b)(1) of the Act, 21 U.S.C. section 360bbb-3(b)(1), unless the authorization is terminated or revoked sooner.  Performed at [facility], [address],  [place], [place] [address]   Blood culture (routine x 2)     Status: None (Preliminary result)   Collection Time: 11/21/20 12:42 PM   Specimen: BLOOD  Result Value Ref Range Status   Specimen Description BLOOD SITE NOT SPECIFIED  Final   Special Requests   Final    BOTTLES DRAWN AEROBIC AND ANAEROBIC Blood Culture results may not be optimal due to an inadequate volume of blood received in culture bottles   Culture   Final    NO GROWTH 3 DAYS Performed at [facility], [address], [place], [place] [address]    Report Status  PENDING  Incomplete  MRSA PCR Screening     Status: None   Collection Time: 11/23/20  8:37 PM   Specimen: Nasopharyngeal  Result Value Ref Range Status   MRSA by PCR NEGATIVE NEGATIVE Final    Comment:        The GeneXpert MRSA Assay (FDA approved for NASAL specimens only), is one component of a comprehensive MRSA colonization surveillance program. It is not intended to diagnose MRSA infection nor to guide or monitor treatment for MRSA infections. Performed at [facility], [address] [place], [place] [address]      Labs: BNP (last 3 results) No results for input(s): BNP in the last 8760 hours. Basic Metabolic Panel: Recent Labs  Lab 11/22/20 0021 11/22/20 0400 11/22/20 0923 11/23/20 0319 11/24/20 0602  NA 122* 123* 123* 125* 131*  K 3.3* 3.9 3.2* 3.4* 3.9  CL 91* 91* 90* 95* 96*  CO2 22 19* 22 21* 24  GLUCOSE 111* 86 106* 96 93  BUN 40* 36* 32* 21 12  CREATININE 1.52* 1.23 1.28* 0.96 0.81  CALCIUM 8.2* 8.7* 8.5* 8.4* 8.9  MG  --  2.1  --   --   --   PHOS  --  3.0  --   --   --    Liver Function Tests: Recent Labs  Lab 11/21/20 1057  AST 39  ALT 26  ALKPHOS 47  BILITOT 2.1*  PROT 6.7  ALBUMIN 4.1   No results for input(s): LIPASE, AMYLASE in the last 168 hours. No results for input(s): AMMONIA in the last 168 hours. CBC: Recent Labs  Lab 11/21/20 1057 11/22/20 0400  WBC 13.6* 8.3  NEUTROABS 10.2*  --   HGB  16.1 14.0  HCT 43.3 38.6*  MCV 88.5 90.6  PLT 443* 336   Cardiac Enzymes: No results for input(s): CKTOTAL, CKMB, CKMBINDEX, TROPONINI in the last 168 hours. BNP: Invalid input(s): POCBNP CBG: No results for input(s): GLUCAP in the last 168 hours. D-Dimer No results for input(s): DDIMER in the last 72 hours. Hgb A1c No results for input(s): HGBA1C in the last 72 hours. Lipid Profile No results for input(s): CHOL, HDL, LDLCALC, TRIG, CHOLHDL, LDLDIRECT in the last 72 hours. Thyroid function studies Recent Labs    11/22/20 1341 11/23/20 0829  TSH 0.323*  --   T3FREE  --  2.4   Anemia work up No results for input(s): VITAMINB12, FOLATE, FERRITIN, TIBC, IRON, RETICCTPCT in the last 72 hours. Urinalysis    Component Value Date/Time   COLORURINE YELLOW 11/21/2020 1830   APPEARANCEUR HAZY (A) 11/21/2020 1830   LABSPEC 1.011 11/21/2020 1830   PHURINE 5.0 11/21/2020 1830   GLUCOSEU NEGATIVE 11/21/2020 1830   HGBUR NEGATIVE 11/21/2020 1830   BILIRUBINUR NEGATIVE 11/21/2020 1830   KETONESUR 5 (A) 11/21/2020 1830   PROTEINUR NEGATIVE 11/21/2020 1830   NITRITE NEGATIVE 11/21/2020 1830   LEUKOCYTESUR NEGATIVE 11/21/2020 1830   Sepsis Labs Invalid input(s): PROCALCITONIN,  WBC,  LACTICIDVEN Microbiology Recent Results (from the past 240 hour(s))  Blood culture (routine x 2)     Status: None (Preliminary result)   Collection Time: 11/21/20 11:28 AM   Specimen: BLOOD LEFT HAND  Result Value Ref Range Status   Specimen Description BLOOD LEFT HAND  Final   Special Requests   Final    BOTTLES DRAWN AEROBIC AND ANAEROBIC Blood Culture results may not be optimal due to an inadequate volume of blood received in culture bottles   Culture  Final    NO GROWTH 3 DAYS Performed at [facility], [address], [place], [place] [address]    Report Status PENDING  Incomplete  SARS Coronavirus 2 by RT PCR (hospital order, performed in [facility] hospital lab) Nasopharyngeal  Nasopharyngeal Swab     Status: None   Collection Time: 11/21/20 11:33 AM   Specimen: Nasopharyngeal Swab  Result Value Ref Range Status   SARS Coronavirus 2 NEGATIVE NEGATIVE Final    Comment: (NOTE) SARS-CoV-2 target nucleic acids are NOT DETECTED.  The SARS-CoV-2 RNA is generally detectable in upper and lower respiratory specimens during the acute phase of infection. The lowest concentration of SARS-CoV-2 viral copies this assay can detect is 250 copies / mL. A negative result does not preclude SARS-CoV-2 infection and should not be used as the sole basis for treatment or other patient management decisions.  A negative result may occur with improper specimen collection / handling, submission of specimen other than nasopharyngeal swab, presence of viral mutation(s) within the areas targeted by this assay, and inadequate number of viral copies (<250 copies / mL). A negative result must be combined with clinical observations, patient history, and epidemiological information.  Fact Sheet for Patients:   BoilerBrush.com.cy  Fact Sheet for Healthcare Providers: https://pope.com/  This test is not yet approved or  cleared by the [place] FDA and has been authorized for detection and/or diagnosis of SARS-CoV-2 by FDA under an Emergency Use Authorization (EUA).  This EUA will remain in effect (meaning this test can be used) for the duration of the COVID-19 declaration under Section 564(b)(1) of the Act, 21 U.S.C. section 360bbb-3(b)(1), unless the authorization is terminated or revoked sooner.  Performed at [facility], [address], [place], [place] [address]   Blood culture (routine x 2)     Status: None (Preliminary result)   Collection Time: 11/21/20 12:42 PM   Specimen: BLOOD  Result Value Ref Range Status   Specimen Description BLOOD SITE NOT SPECIFIED  Final   Special Requests   Final    BOTTLES DRAWN AEROBIC AND  ANAEROBIC Blood Culture results may not be optimal due to an inadequate volume of blood received in culture bottles   Culture   Final    NO GROWTH 3 DAYS Performed at [facility], [address], [place], [place] [address]    Report Status PENDING  Incomplete  MRSA PCR Screening     Status: None   Collection Time: 11/23/20  8:37 PM   Specimen: Nasopharyngeal  Result Value Ref Range Status   MRSA by PCR NEGATIVE NEGATIVE Final    Comment:        The GeneXpert MRSA Assay (FDA approved for NASAL specimens only), is one component of a comprehensive MRSA colonization surveillance program. It is not intended to diagnose MRSA infection nor to guide or monitor treatment for MRSA infections. Performed at [facility], [address], [place], [place] [address]     Please note: You were cared for by a hospitalist during your hospital stay. Once you are discharged, your primary care physician will handle any further medical issues. Please note that NO REFILLS for any discharge medications will be authorized once you are discharged, as it is imperative that you return to your primary care physician (or establish a relationship with a primary care physician if you do not have one) for your post hospital discharge needs so that they can reassess your need for  medications and monitor your lab values.    Time coordinating discharge: 40 minutes  SIGNED:   Burnadette PopAmrit Nicholes Hibler, MD  [facility] 11/24/2020, 1:05 PM Pager [phone]  If 7PM-7AM, please contact night-coverage [email or web address] Password [email or web address]

## 2020-11-26 LAB — CULTURE, BLOOD (ROUTINE X 2)
Culture: NO GROWTH
Culture: NO GROWTH

## 2021-05-26 ENCOUNTER — Inpatient Hospital Stay (HOSPITAL_COMMUNITY)
Admission: EM | Admit: 2021-05-26 | Discharge: 2021-05-29 | DRG: 897 | Disposition: A | Discharge status: Home / Self Care | Payer: Medicare Other | Attending: Internal Medicine | Admitting: Internal Medicine

## 2021-05-26 ENCOUNTER — Encounter (HOSPITAL_COMMUNITY): Payer: Self-pay

## 2021-05-26 DIAGNOSIS — R55 Syncope and collapse: Secondary | ICD-10-CM | POA: Diagnosis present

## 2021-05-26 DIAGNOSIS — Z960 Presence of urogenital implants: Secondary | ICD-10-CM | POA: Diagnosis present

## 2021-05-26 DIAGNOSIS — I10 Essential (primary) hypertension: Secondary | ICD-10-CM | POA: Diagnosis present

## 2021-05-26 DIAGNOSIS — Z8679 Personal history of other diseases of the circulatory system: Secondary | ICD-10-CM

## 2021-05-26 DIAGNOSIS — Z79899 Other long term (current) drug therapy: Secondary | ICD-10-CM | POA: Diagnosis not present

## 2021-05-26 DIAGNOSIS — E871 Hypo-osmolality and hyponatremia: Secondary | ICD-10-CM | POA: Diagnosis present

## 2021-05-26 DIAGNOSIS — F17210 Nicotine dependence, cigarettes, uncomplicated: Secondary | ICD-10-CM | POA: Diagnosis present

## 2021-05-26 DIAGNOSIS — K589 Irritable bowel syndrome without diarrhea: Secondary | ICD-10-CM | POA: Diagnosis present

## 2021-05-26 DIAGNOSIS — K219 Gastro-esophageal reflux disease without esophagitis: Secondary | ICD-10-CM | POA: Diagnosis present

## 2021-05-26 DIAGNOSIS — Y904 Blood alcohol level of 80-99 mg/100 ml: Secondary | ICD-10-CM | POA: Diagnosis present

## 2021-05-26 DIAGNOSIS — J449 Chronic obstructive pulmonary disease, unspecified: Secondary | ICD-10-CM | POA: Diagnosis present

## 2021-05-26 DIAGNOSIS — F1010 Alcohol abuse, uncomplicated: Secondary | ICD-10-CM | POA: Diagnosis present

## 2021-05-26 DIAGNOSIS — Z7141 Alcohol abuse counseling and surveillance of alcoholic: Secondary | ICD-10-CM

## 2021-05-26 DIAGNOSIS — J302 Other seasonal allergic rhinitis: Secondary | ICD-10-CM | POA: Diagnosis present

## 2021-05-26 DIAGNOSIS — Z87440 Personal history of urinary (tract) infections: Secondary | ICD-10-CM | POA: Diagnosis not present

## 2021-05-26 DIAGNOSIS — Z20822 Contact with and (suspected) exposure to covid-19: Secondary | ICD-10-CM | POA: Diagnosis present

## 2021-05-26 DIAGNOSIS — F10139 Alcohol abuse with withdrawal, unspecified: Secondary | ICD-10-CM | POA: Diagnosis present

## 2021-05-26 LAB — CBC WITH DIFFERENTIAL/PLATELET
Abs Immature Granulocytes: 0.06 10*3/uL (ref 0.00–0.07)
Basophils Absolute: 0.1 10*3/uL (ref 0.0–0.1)
Basophils Relative: 1 %
Eosinophils Absolute: 0.1 10*3/uL (ref 0.0–0.5)
Eosinophils Relative: 1 %
HCT: 37.8 % — ABNORMAL LOW (ref 39.0–52.0)
Hemoglobin: 13.9 g/dL (ref 13.0–17.0)
Immature Granulocytes: 1 %
Lymphocytes Relative: 16 %
Lymphs Abs: 1 10*3/uL (ref 0.7–4.0)
MCH: 33.9 pg (ref 26.0–34.0)
MCHC: 36.8 g/dL — ABNORMAL HIGH (ref 30.0–36.0)
MCV: 92.2 fL (ref 80.0–100.0)
Monocytes Absolute: 0.8 10*3/uL (ref 0.1–1.0)
Monocytes Relative: 14 %
Neutro Abs: 4.1 10*3/uL (ref 1.7–7.7)
Neutrophils Relative %: 67 %
Platelets: 280 10*3/uL (ref 150–400)
RBC: 4.1 MIL/uL — ABNORMAL LOW (ref 4.22–5.81)
RDW: 12.4 % (ref 11.5–15.5)
WBC: 6.1 10*3/uL (ref 4.0–10.5)
nRBC: 0 % (ref 0.0–0.2)

## 2021-05-26 LAB — COMPREHENSIVE METABOLIC PANEL
ALT: 32 U/L (ref 0–44)
AST: 39 U/L (ref 15–41)
Albumin: 3.9 g/dL (ref 3.5–5.0)
Alkaline Phosphatase: 48 U/L (ref 38–126)
Anion gap: 12 (ref 5–15)
BUN: 7 mg/dL — ABNORMAL LOW (ref 8–23)
CO2: 22 mmol/L (ref 22–32)
Calcium: 8.9 mg/dL (ref 8.9–10.3)
Chloride: 84 mmol/L — ABNORMAL LOW (ref 98–111)
Creatinine, Ser: 0.72 mg/dL (ref 0.61–1.24)
GFR, Estimated: >60 mL/min (ref >60)
Glucose, Bld: 102 mg/dL — ABNORMAL HIGH (ref 70–99)
Potassium: 4 mmol/L (ref 3.5–5.1)
Sodium: 118 mmol/L — CL (ref 135–145)
Total Bilirubin: 0.9 mg/dL (ref 0.3–1.2)
Total Protein: 6.4 g/dL — ABNORMAL LOW (ref 6.5–8.1)

## 2021-05-26 LAB — RESP PANEL BY RT-PCR (FLU A&B, COVID) ARPGX2
Influenza A by PCR: NEGATIVE
Influenza B by PCR: NEGATIVE
SARS Coronavirus 2 by RT PCR: NEGATIVE

## 2021-05-26 LAB — PROTIME-INR
INR: 0.9 (ref 0.8–1.2)
Prothrombin Time: 12.1 seconds (ref 11.4–15.2)

## 2021-05-26 LAB — CBG MONITORING, ED: Glucose-Capillary: 113 mg/dL — ABNORMAL HIGH (ref 70–99)

## 2021-05-26 LAB — ETHANOL: Alcohol, Ethyl (B): 83 mg/dL — ABNORMAL HIGH (ref <10)

## 2021-05-26 MED ORDER — LORAZEPAM 1 MG PO TABS: 0.0000 mg | ORAL_TABLET | Freq: Two times a day (BID) | ORAL | Status: DC

## 2021-05-26 MED ORDER — CHLORHEXIDINE GLUCONATE CLOTH 2 % EX PADS
6.0000 | MEDICATED_PAD | Freq: Every day | CUTANEOUS | Status: DC
  Administered 2021-05-26 – 2021-05-28 (×2): 6 via TOPICAL

## 2021-05-26 MED ORDER — LORAZEPAM 2 MG/ML IJ SOLN: 0.0000 mg | Freq: Four times a day (QID) | INTRAMUSCULAR | Status: DC

## 2021-05-26 MED ORDER — SODIUM CHLORIDE 0.9 % IV BOLUS
1000.0000 mL | Freq: Once | INTRAVENOUS | Status: AC
  Administered 2021-05-26: 1000 mL via INTRAVENOUS

## 2021-05-26 MED ORDER — THIAMINE HCL 100 MG/ML IJ SOLN: 100.0000 mg | Freq: Every day | INTRAMUSCULAR | Status: DC

## 2021-05-26 MED ORDER — LORAZEPAM 1 MG PO TABS: 0.0000 mg | ORAL_TABLET | Freq: Four times a day (QID) | ORAL | Status: DC

## 2021-05-26 MED ORDER — ACETAMINOPHEN 650 MG RE SUPP: 650.0000 mg | Freq: Four times a day (QID) | RECTAL | Status: DC | PRN

## 2021-05-26 MED ORDER — SODIUM CHLORIDE 0.9 % IV SOLN: INTRAVENOUS | Status: DC

## 2021-05-26 MED ORDER — ADULT MULTIVITAMIN W/MINERALS CH
1.0000 | ORAL_TABLET | Freq: Every day | ORAL | Status: DC
  Administered 2021-05-26 – 2021-05-29 (×4): 1 via ORAL
  Filled 2021-05-26 (×4): qty 1

## 2021-05-26 MED ORDER — LORAZEPAM 1 MG PO TABS
1.0000 mg | ORAL_TABLET | ORAL | Status: DC | PRN
  Administered 2021-05-28: 1 mg via ORAL
  Administered 2021-05-28 – 2021-05-29 (×2): 2 mg via ORAL
  Filled 2021-05-26: qty 1
  Filled 2021-05-26 (×3): qty 2

## 2021-05-26 MED ORDER — THIAMINE HCL 100 MG PO TABS
100.0000 mg | ORAL_TABLET | Freq: Every day | ORAL | Status: DC
  Administered 2021-05-26 – 2021-05-29 (×4): 100 mg via ORAL
  Filled 2021-05-26 (×4): qty 1

## 2021-05-26 MED ORDER — LORAZEPAM 2 MG/ML IJ SOLN
1.0000 mg | INTRAMUSCULAR | Status: DC | PRN
  Administered 2021-05-26: 3 mg via INTRAVENOUS
  Filled 2021-05-26 (×2): qty 2

## 2021-05-26 MED ORDER — ONDANSETRON HCL 4 MG PO TABS: 4.0000 mg | ORAL_TABLET | Freq: Four times a day (QID) | ORAL | Status: DC | PRN

## 2021-05-26 MED ORDER — LORAZEPAM 2 MG/ML IJ SOLN
0.0000 mg | Freq: Four times a day (QID) | INTRAMUSCULAR | Status: DC
  Administered 2021-05-26 – 2021-05-28 (×5): 2 mg via INTRAVENOUS
  Filled 2021-05-26 (×4): qty 1

## 2021-05-26 MED ORDER — ENOXAPARIN SODIUM 40 MG/0.4ML IJ SOSY
40.0000 mg | PREFILLED_SYRINGE | INTRAMUSCULAR | Status: DC
  Administered 2021-05-26: 40 mg via SUBCUTANEOUS
  Filled 2021-05-26: qty 0.4

## 2021-05-26 MED ORDER — LORAZEPAM 2 MG/ML IJ SOLN: 0.0000 mg | Freq: Two times a day (BID) | INTRAMUSCULAR | Status: DC

## 2021-05-26 MED ORDER — ACETAMINOPHEN 325 MG PO TABS
650.0000 mg | ORAL_TABLET | Freq: Four times a day (QID) | ORAL | Status: DC | PRN
  Administered 2021-05-27: 650 mg via ORAL
  Filled 2021-05-26: qty 2

## 2021-05-26 MED ORDER — ALBUTEROL SULFATE (2.5 MG/3ML) 0.083% IN NEBU: 3.0000 mL | INHALATION_SOLUTION | Freq: Four times a day (QID) | RESPIRATORY_TRACT | Status: DC | PRN

## 2021-05-26 MED ORDER — FOLIC ACID 1 MG PO TABS
1.0000 mg | ORAL_TABLET | Freq: Every day | ORAL | Status: DC
  Administered 2021-05-26 – 2021-05-29 (×4): 1 mg via ORAL
  Filled 2021-05-26 (×4): qty 1

## 2021-05-26 MED ORDER — ONDANSETRON HCL 4 MG/2ML IJ SOLN: 4.0000 mg | Freq: Four times a day (QID) | INTRAMUSCULAR | Status: DC | PRN

## 2021-05-26 NOTE — H&P (Signed)
History and Physical    Alvin Benton VZD:[id number] DOB: May 12, 1951 DOA: 05/26/2021  PCP: Judd Lien, PA-C   Patient coming from: Home   Chief Complaint: Lightheaded, near-syncope   HPI: Alvin Benton is a 70 y.o. male with medical history significant for alcohol abuse, history of atrial fibrillation not anticoagulated, COPD, hypertension, and admission in January of this year with severe hyponatremia, now presenting to the emergency department with lightheadedness and near syncope.  Patient reports that he had quit drinking for a while but has now resumed and consumes 6 beers per day, has had issues with lightheadedness for about a year, mainly with standing, and this has been worse for the past few days and culminated in a near syncopal episode today.  He reports some nausea but denies any vomiting or diarrhea.  He denies any chest pain, palpitations, or recent fevers or chills.  States that he uses his albuterol inhaler almost every day but denies any recent increase in cough or shortness of breath.    ED Course: Upon arrival to the ED, patient is found to be afebrile, saturating mid 90s on room air, and with stable blood pressure.  EKG features sinus rhythm with first-degree abdominal block and LVH with repolarization abnormality.  Chemistry panel notable for sodium of 118.  CBC unremarkable.  Ethanol level 83.  Patient was given a liter of saline and started on saline infusion in the ED.  Review of Systems:  All other systems reviewed and apart from HPI, are negative.  Past Medical History:  Diagnosis Date   Atrial fibrillation (HCC) 06/2017   Blood in stool    Diverticulitis    ETOH abuse    GERD (gastroesophageal reflux disease)    Heart murmur    History of urinary tract infection    Hypertension    IBS (irritable bowel syndrome)    Migraine    "none in a long time" (03/14/2016)   Nocturia    Pneumonia    childhood   Seasonal allergies    Tobacco abuse     Past Surgical  History:  Procedure Laterality Date   COLONOSCOPY     CYSTOSCOPY WITH STENT PLACEMENT Left 10/03/2016   Procedure: CYSTOSCOPY WITH  LEFT URETERAL CATHETER PLACEMENT;  Surgeon: Bjorn Pippin, MD;  Location: [facility];  Service: Urology;  Laterality: Left;   PROCTOSCOPY  10/03/2016   Procedure: PROCTOSCOPY;  Surgeon: Romie Levee, MD;  Location: [facility];  Service: General;;   RIGHT/LEFT HEART CATH AND CORONARY ANGIOGRAPHY N/A 07/22/2017   Procedure: RIGHT/LEFT HEART CATH AND CORONARY ANGIOGRAPHY;  Surgeon: Swazil[place], Peter M, MD;  Location: [facility] INVASIVE CV LAB;  Service: Cardiovascular;  Laterality: N/A;   TONSILLECTOMY      Social History:   reports that he has been smoking cigarettes. He has a 40.00 pack-year smoking history. He has never used smokeless tobacco. He reports current alcohol use of about 3.0 standard drinks of alcohol per week. He reports that he does not use drugs.  No Known Allergies  Family History  Problem Relation Age of Onset   Stroke Mother    Colon cancer Neg Hx    Esophageal cancer Neg Hx    Rectal cancer Neg Hx    Stomach cancer Neg Hx      Prior to Admission medications   Medication Sig Start Date End Date Taking? Authorizing Provider  acetaminophen (TYLENOL) 325 MG tablet Take 650 mg by mouth every 6 (six) hours as needed for mild pain or headache.  [provider]  Albuterol Sulfate 108 (90 Base) MCG/ACT AEPB Inhale 2 puffs into the lungs every 6 (six) hours as needed for shortness of breath. 02/11/17   [provider]  folic acid (FOLVITE) 1 MG tablet Take 1 tablet (1 mg total) by mouth daily. 11/25/20   Burnadette Pop, MD  gabapentin (NEURONTIN) 300 MG capsule Take 1 capsule (300 mg total) by mouth 3 (three) times daily. 11/24/20   Burnadette Pop, MD  lidocaine (LIDODERM) 5 % Place 3 patches onto the skin daily. Remove & Discard patch within 12 hours or as directed by MD 11/24/20   Burnadette Pop, MD  nicotine (NICODERM CQ - DOSED IN MG/24 HOURS)  21 mg/24hr patch Place 1 patch (21 mg total) onto the skin daily. 11/25/20   Burnadette Pop, MD  oxyCODONE (OXY IR/ROXICODONE) 5 MG immediate release tablet Take 1 tablet (5 mg total) by mouth every 6 (six) hours as needed for moderate pain. 11/24/20   Burnadette Pop, MD  thiamine 100 MG tablet Take 1 tablet (100 mg total) by mouth daily. 11/25/20   Burnadette Pop, MD    Physical Exam: Vitals:   05/26/21 1845 05/26/21 1858 05/26/21 1900 05/26/21 2000  BP: (!) 122/59 118/65 129/66 135/64  Pulse: 69  90 67  Resp: 19  (!) 22 15  Temp:      TempSrc:      SpO2: 94%  96% 95%  Weight:      Height:        Constitutional: NAD, calm  Eyes: PERTLA, lids and conjunctivae normal ENMT: Mucous membranes are moist. Posterior pharynx clear of any exudate or lesions.   Neck:  supple, no masses  Respiratory: no wheezing, no crackles. Unlabored respirations.  Cardiovascular: S1 & S2 heard, regular rate and rhythm. No extremity edema.  Abdomen: No distension, no tenderness, soft. Bowel sounds active.  Musculoskeletal: no clubbing / cyanosis. No joint deformity upper and lower extremities.   Skin: no significant rashes, lesions, ulcers. Warm, dry, well-perfused. Neurologic: CN 2-12 grossly intact. Sensation intact. Moving all extremities.  Psychiatric: Alert and oriented to person, place, and situation. Calm and cooperative.    Labs and Imaging on Admission: I have personally reviewed following labs and imaging studies  CBC: Recent Labs  Lab 05/26/21 1903  WBC 6.1  NEUTROABS 4.1  HGB 13.9  HCT 37.8*  MCV 92.2  PLT 280   Basic Metabolic Panel: Recent Labs  Lab 05/26/21 1903  NA 118*  K 4.0  CL 84*  CO2 22  GLUCOSE 102*  BUN 7*  CREATININE 0.72  CALCIUM 8.9   GFR: Estimated Creatinine Clearance: 88.4 mL/min (by C-G formula based on SCr of 0.72 mg/dL). Liver Function Tests: Recent Labs  Lab 05/26/21 1903  AST 39  ALT 32  ALKPHOS 48  BILITOT 0.9  PROT 6.4*  ALBUMIN 3.9    No results for input(s): LIPASE, AMYLASE in the last 168 hours. No results for input(s): AMMONIA in the last 168 hours. Coagulation Profile: Recent Labs  Lab 05/26/21 1903  INR 0.9   Cardiac Enzymes: No results for input(s): CKTOTAL, CKMB, CKMBINDEX, TROPONINI in the last 168 hours. BNP (last 3 results) No results for input(s): PROBNP in the last 8760 hours. HbA1C: No results for input(s): HGBA1C in the last 72 hours. CBG: Recent Labs  Lab 05/26/21 1850  GLUCAP 113*   Lipid Profile: No results for input(s): CHOL, HDL, LDLCALC, TRIG, CHOLHDL, LDLDIRECT in the last 72 hours. Thyroid Function Tests: No results  for input(s): TSH, T4TOTAL, FREET4, T3FREE, THYROIDAB in the last 72 hours. Anemia Panel: No results for input(s): VITAMINB12, FOLATE, FERRITIN, TIBC, IRON, RETICCTPCT in the last 72 hours. Urine analysis:    Component Value Date/Time   COLORURINE YELLOW 11/21/2020 1830   APPEARANCEUR HAZY (A) 11/21/2020 1830   LABSPEC 1.011 11/21/2020 1830   PHURINE 5.0 11/21/2020 1830   GLUCOSEU NEGATIVE 11/21/2020 1830   HGBUR NEGATIVE 11/21/2020 1830   BILIRUBINUR NEGATIVE 11/21/2020 1830   KETONESUR 5 (A) 11/21/2020 1830   PROTEINUR NEGATIVE 11/21/2020 1830   NITRITE NEGATIVE 11/21/2020 1830   LEUKOCYTESUR NEGATIVE 11/21/2020 1830   Sepsis Labs: @LABRCNTIP (procalcitonin:4,lacticidven:4) )No results found for this or any previous visit (from the past 240 hour(s)).   Radiological Exams on Admission: No results found.  EKG: Independently reviewed. Sinus rhythm, 1st degree AV block, LVH with repolarization abnormality.   Assessment/Plan   1. Hyponatremia  - Pt with alcohol dependence and hx of severe hyponatremia now presents with lightheadedness when standing and near-syncope and is found to have serum sodium of 118  - He was instructed to stop HCTZ in January, still takes a BP med daily but is unsure of name  - Likely combination hypovolemia and excessive free water  (i.e. beer) - Given 1 liter NS in ED - Continue NS at 100 cc/hr, restrict free water, follow serial chem panels with goal correction 4-6 mEq in 24 hrs, stop thiazide    2. Near-syncope  - Presents with lightheadedness when standing and near-syncope  - EKG with SR, 1st degree AV block, and LVH with repolarization abnormality  - Likely related to hypovolemia and orthostasis - Continue cardiac monitoring, check orthostatic vitals, continue IVF hydration   3. Alcohol abuse  - Pt with hx of alcohol dependence had stopped drinking for awhile but now consuming 6 beers/day again  - EtOH level 83 in ED  - Monitor with CIWA, supplement vitamins, use Ativan if needed   4. Hypertension  - He reports taking the same antihypertensive for years but unable to recall the name; was advised to stop lisinopril-HCTZ in January after admission for severe hyponatremia  - Follow-up pharmacy medication reconciliation, treat as-needed only for now, avoid diuretics    5. COPD  - Not in exacerbation  - Continue as-needed albuterol, smoking cessation efforts    6. History of atrial fibrillation  - In sinus rhythm on admission, not anticoagulated    DVT prophylaxis: Lovenox  Code Status: Full Level of Care: Level of care: Stepdown Family Communication: None present  Disposition Plan:  Patient is from: Home  Anticipated d/c is to: Home Anticipated d/c date is: 05/28/21 Patient currently: Pending improvement in sodium level Consults called: None  Admission status: Inpatient     07/28/21, MD [facility]  05/26/2021, 9:17 PM

## 2021-05-26 NOTE — ED Triage Notes (Addendum)
Per EMS, Pt picked up from [employer] [facility], c/o dizziness and near syncope.  Pt reported unsteadiness and "equilibrium being off" x a year.  Pt admitted to drinking "2-3 beers" this afternoon.  Denies pain.

## 2021-05-26 NOTE — ED Provider Notes (Signed)
[facility]-EMERGENCY DEPT Provider Note   CSN: [id number] Arrival date & time: 05/26/21  1759     History Chief Complaint  Patient presents with   Near Syncope    Alvin Benton is a 70 y.o. male.  HPI     This is a 70 year old male with a history of atrial fibrillation, alcohol abuse, hypertension who presents with lightheadedness.  Patient reports that he was standing in line at the grocery store when he began to feel lightheaded.  He describes it as feeling off balance and lightheaded.  He denies room spinning dizziness.  He did not pass out.  Denies chest pain or shortness of breath.  Reports that he drinks 3 beers earlier today which is not abnormal for him.  He states "I do not use the hard stuff."  Denies headache or neck pain.  Denies recent illnesses or fevers.  Reports history of lightheadedness in the past.  Past Medical History:  Diagnosis Date   Atrial fibrillation (HCC) 06/2017   Blood in stool    Diverticulitis    ETOH abuse    GERD (gastroesophageal reflux disease)    Heart murmur    History of urinary tract infection    Hypertension    IBS (irritable bowel syndrome)    Migraine    "none in a long time" (03/14/2016)   Nocturia    Pneumonia    childhood   Seasonal allergies    Tobacco abuse     Patient Active Problem List   Diagnosis Date Noted   Hyponatremia 05/26/2021   COPD (chronic obstructive pulmonary disease) (HCC) 05/26/2021   Chronic back pain greater than 3 months duration 11/21/2020   Elevated troponin    Nausea & vomiting 07/21/2017   Alcoholic ketoacidosis 07/21/2017   Dehydration 05/20/2017   Near syncope 02/26/2017   History of atrial fibrillation 02/26/2017   Right flank hematoma 02/26/2017   Zygomatic fracture, right side, initial encounter for closed fracture (HCC) 02/26/2017   Dizziness    Fall    Dehydration with hyponatremia 02/20/2017   Volume depletion 06/04/2016   Diverticulitis of intestine with  perforation without bleeding 06/04/2016   Essential hypertension    Hypomagnesemia    Systolic murmur 05/05/2016   Anxiety state 04/19/2016   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   Atrial fibrillation (HCC) 04/05/2016   Diverticulitis of large intestine with perforation and abscess 03/14/2016   Chronic constipation 03/08/2016   Alcohol abuse 02/15/2016   Tobacco use disorder 07/17/2015   Allergic rhinitis 07/17/2015   Reactive airway disease with wheezing 07/17/2015   Overweight (BMI 25.0-29.9) 07/17/2015   Hand pain, left 01/12/2014   LLQ pain-chronic, intermittent and episodic 09/17/2013    Past Surgical History:  Procedure Laterality Date   COLONOSCOPY     CYSTOSCOPY WITH STENT PLACEMENT Left 10/03/2016   Procedure: CYSTOSCOPY WITH  LEFT URETERAL CATHETER PLACEMENT;  Surgeon: Bjorn Pippin, MD;  Location: [facility];  Service: Urology;  Laterality: Left;   PROCTOSCOPY  10/03/2016   Procedure: PROCTOSCOPY;  Surgeon: Romie Levee, MD;  Location: [facility];  Service: General;;   RIGHT/LEFT HEART CATH AND CORONARY ANGIOGRAPHY N/A 07/22/2017   Procedure: RIGHT/LEFT HEART CATH AND CORONARY ANGIOGRAPHY;  Surgeon: Swazil[place], Peter M, MD;  Location: [facility] INVASIVE CV LAB;  Service: Cardiovascular;  Laterality: N/A;   TONSILLECTOMY         Family History  Problem Relation Age of Onset   Stroke Mother    Colon cancer Neg Hx    Esophageal  cancer Neg Hx    Rectal cancer Neg Hx    Stomach cancer Neg Hx     Social History   Tobacco Use   Smoking status: Every Day    Packs/day: 1.00    Years: 40.00    Pack years: 40.00    Types: Cigarettes   Smokeless tobacco: Never  Vaping Use   Vaping Use: Never used  Substance Use Topics   Alcohol use: Yes    Alcohol/week: 3.0 standard drinks    Types: 3 Shots of liquor per week    Comment: pt states does not drink daily has beer with football   Drug use: No    Home Medications Prior to Admission medications   Medication Sig Start Date End Date Taking?  Authorizing Provider  albuterol (VENTOLIN HFA) 108 (90 Base) MCG/ACT inhaler Inhale 2 puffs into the lungs every 6 (six) hours as needed for wheezing or shortness of breath. 04/13/21  Yes [provider]  Aspirin-Acetaminophen-Caffeine (EXCEDRIN PO) Take 2 tablets by mouth every morning.   Yes [provider]  lisinopril-hydrochlorothiazide (ZESTORETIC) 20-25 MG tablet Take 1 tablet by mouth every morning. 04/13/21  Yes [provider]  folic acid (FOLVITE) 1 MG tablet Take 1 tablet (1 mg total) by mouth daily. Patient not taking: No sig reported 11/25/20   Burnadette PopAdhikari, Amrit, MD  gabapentin (NEURONTIN) 300 MG capsule Take 1 capsule (300 mg total) by mouth 3 (three) times daily. Patient not taking: No sig reported 11/24/20   Burnadette PopAdhikari, Amrit, MD  lidocaine (LIDODERM) 5 % Place 3 patches onto the skin daily. Remove & Discard patch within 12 hours or as directed by MD Patient not taking: No sig reported 11/24/20   Burnadette PopAdhikari, Amrit, MD  nicotine (NICODERM CQ - DOSED IN MG/24 HOURS) 21 mg/24hr patch Place 1 patch (21 mg total) onto the skin daily. Patient not taking: No sig reported 11/25/20   Burnadette PopAdhikari, Amrit, MD  oxyCODONE (OXY IR/ROXICODONE) 5 MG immediate release tablet Take 1 tablet (5 mg total) by mouth every 6 (six) hours as needed for moderate pain. Patient not taking: No sig reported 11/24/20   Burnadette PopAdhikari, Amrit, MD  thiamine 100 MG tablet Take 1 tablet (100 mg total) by mouth daily. Patient not taking: No sig reported 11/25/20   Burnadette PopAdhikari, Amrit, MD    Allergies    Patient has no known allergies.  Review of Systems   Review of Systems  Constitutional:  Negative for fever.  Respiratory:  Negative for shortness of breath.   Cardiovascular:  Negative for chest pain.  Gastrointestinal:  Negative for abdominal pain, nausea and vomiting.  Skin:  Positive for color change.  Neurological:  Positive for light-headedness. Negative for dizziness, syncope, weakness and headaches.   All other systems reviewed and are negative.  Physical Exam Updated Vital Signs BP 135/64   Pulse 67   Temp 97.8 F (36.6 C) (Oral)   Resp 15   Ht 1.753 m (5\' 9" )   Wt 81.6 kg   SpO2 95%   BMI 26.58 kg/m   Physical Exam Vitals and nursing note reviewed.  Constitutional:      Appearance: He is well-developed.     Comments: Chronically ill-appearing but nontoxic  HENT:     Head: Normocephalic and atraumatic.     Nose: Nose normal.     Mouth/Throat:     Mouth: Mucous membranes are moist.  Eyes:     Pupils: Pupils are equal, round, and reactive to light.  Cardiovascular:  Rate and Rhythm: Normal rate and regular rhythm.     Heart sounds: Normal heart sounds. No murmur heard. Pulmonary:     Effort: Pulmonary effort is normal. No respiratory distress.     Breath sounds: Normal breath sounds. No wheezing.  Abdominal:     General: Bowel sounds are normal. There is no distension.     Palpations: Abdomen is soft.     Tenderness: There is no abdominal tenderness. There is no rebound.  Musculoskeletal:     Cervical back: Neck supple.     Right lower leg: No edema.     Left lower leg: No edema.  Lymphadenopathy:     Cervical: No cervical adenopathy.  Skin:    General: Skin is warm and dry.     Comments: Purpura noted bilateral upper extremities  Neurological:     Mental Status: He is alert and oriented to person, place, and time.     Comments: Cranial nerves II through XII intact, 5 out of 5 strength in all 4 extremities, no dysmetria to finger-nose-finger, slight resting tremor noted  Psychiatric:        Mood and Affect: Mood normal.    ED Results / Procedures / Treatments   Labs (all labs ordered are listed, but only abnormal results are displayed) Labs Reviewed  CBC WITH DIFFERENTIAL/PLATELET - Abnormal; Notable for the following components:      Result Value   RBC 4.10 (*)    HCT 37.8 (*)    MCHC 36.8 (*)    All other components within normal limits   COMPREHENSIVE METABOLIC PANEL - Abnormal; Notable for the following components:   Sodium 118 (*)    Chloride 84 (*)    Glucose, Bld 102 (*)    BUN 7 (*)    Total Protein 6.4 (*)    All other components within normal limits  ETHANOL - Abnormal; Notable for the following components:   Alcohol, Ethyl (B) 83 (*)    All other components within normal limits  CBG MONITORING, ED - Abnormal; Notable for the following components:   Glucose-Capillary 113 (*)    All other components within normal limits  RESP PANEL BY RT-PCR (FLU A&B, COVID) ARPGX2  PROTIME-INR  SODIUM, URINE, RANDOM  OSMOLALITY, URINE  BASIC METABOLIC PANEL  BASIC METABOLIC PANEL  BASIC METABOLIC PANEL  CBC  MAGNESIUM  PHOSPHORUS    EKG EKG Interpretation  Date/Time:  Saturday May 26 2021 18:40:59 EDT Ventricular Rate:  74 PR Interval:  212 QRS Duration: 85 QT Interval:  402 QTC Calculation: 446 R Axis:   6 Text Interpretation: Sinus rhythm Borderline prolonged PR interval Left ventricular hypertrophy Anterior ST elevation, probably due to LVH No significant change since last tracing Confirmed by Ross Marcus ([id number]) on 05/26/2021 6:54:07 PM  Radiology No results found.  Procedures .Critical Care  Date/Time: 05/26/2021 10:57 PM Performed by: Shon Baton, MD Authorized by: Shon Baton, MD   Critical care provider statement:    Critical care time (minutes):  45   Critical care was necessary to treat or prevent imminent or life-threatening deterioration of the following conditions:  Metabolic crisis   Critical care was time spent personally by me on the following activities:  Discussions with consultants, evaluation of patient's response to treatment, examination of patient, ordering and performing treatments and interventions, ordering and review of laboratory studies, ordering and review of radiographic studies, pulse oximetry, re-evaluation of patient's condition, obtaining history from  patient or surrogate and review  of old charts   Medications Ordered in ED Medications  thiamine tablet 100 mg (has no administration in time range)    Or  thiamine (B-1) injection 100 mg (has no administration in time range)  0.9 %  sodium chloride infusion (has no administration in time range)  enoxaparin (LOVENOX) injection 40 mg (has no administration in time range)  acetaminophen (TYLENOL) tablet 650 mg (has no administration in time range)    Or  acetaminophen (TYLENOL) suppository 650 mg (has no administration in time range)  ondansetron (ZOFRAN) tablet 4 mg (has no administration in time range)    Or  ondansetron (ZOFRAN) injection 4 mg (has no administration in time range)  LORazepam (ATIVAN) tablet 1-4 mg ( Oral See Alternative 05/26/21 2150)    Or  LORazepam (ATIVAN) injection 1-4 mg (3 mg Intravenous Given 05/26/21 2150)  folic acid (FOLVITE) tablet 1 mg (has no administration in time range)  multivitamin with minerals tablet 1 tablet (has no administration in time range)  LORazepam (ATIVAN) injection 0-4 mg (has no administration in time range)    Followed by  LORazepam (ATIVAN) injection 0-4 mg (has no administration in time range)  albuterol (PROVENTIL) (2.5 MG/3ML) 0.083% nebulizer solution 3 mL (has no administration in time range)  sodium chloride 0.9 % bolus 1,000 mL (0 mLs Intravenous Stopped 05/26/21 2130)    ED Course  I have reviewed the triage vital signs and the nursing notes.  Pertinent labs & imaging results that were available during my care of the patient were reviewed by me and considered in my medical decision making (see chart for details).    MDM Rules/Calculators/A&P                           Patient presents with near syncope.  He is nontoxic-appearing but chronically ill-appearing.  History of alcohol abuse.  EKG shows no evidence of acute arrhythmia.  He is otherwise asymptomatic without chest pain.  Doubt ACS.  He does have a significant alcohol  history.  Could be intoxication or metabolic derangement secondary to alcohol use.  He is nonfocal.  Labs obtained.  Significant hyponatremia with a sodium of 118.  He has a history of the same.  Likely related to alcohol abuse.  Alcohol level 83.  Is hemodynamically stable.  He received 1 L of fluids.  He was started on 100 cc/h of normal saline.  Started on CIWA protocol.  No history of withdrawals.   Final Clinical Impression(s) / ED Diagnoses Final diagnoses:  Hyponatremia  Alcohol abuse    Rx / DC Orders ED Discharge Orders     None        Celeste Candelas, Mayer Masker, MD 05/26/21 2300

## 2021-05-27 ENCOUNTER — Other Ambulatory Visit: Payer: Self-pay

## 2021-05-27 DIAGNOSIS — I10 Essential (primary) hypertension: Secondary | ICD-10-CM | POA: Diagnosis not present

## 2021-05-27 DIAGNOSIS — F1010 Alcohol abuse, uncomplicated: Secondary | ICD-10-CM | POA: Diagnosis not present

## 2021-05-27 DIAGNOSIS — J449 Chronic obstructive pulmonary disease, unspecified: Secondary | ICD-10-CM

## 2021-05-27 DIAGNOSIS — E871 Hypo-osmolality and hyponatremia: Secondary | ICD-10-CM

## 2021-05-27 DIAGNOSIS — Z8679 Personal history of other diseases of the circulatory system: Secondary | ICD-10-CM

## 2021-05-27 DIAGNOSIS — R55 Syncope and collapse: Secondary | ICD-10-CM

## 2021-05-27 LAB — MRSA NEXT GEN BY PCR, NASAL: MRSA by PCR Next Gen: NOT DETECTED

## 2021-05-27 LAB — BASIC METABOLIC PANEL
Anion gap: 12 (ref 5–15)
Anion gap: 9 (ref 5–15)
BUN: 7 mg/dL — ABNORMAL LOW (ref 8–23)
BUN: 7 mg/dL — ABNORMAL LOW (ref 8–23)
CO2: 22 mmol/L (ref 22–32)
CO2: 23 mmol/L (ref 22–32)
Calcium: 8.7 mg/dL — ABNORMAL LOW (ref 8.9–10.3)
Calcium: 8.9 mg/dL (ref 8.9–10.3)
Chloride: 89 mmol/L — ABNORMAL LOW (ref 98–111)
Chloride: 91 mmol/L — ABNORMAL LOW (ref 98–111)
Creatinine, Ser: 0.7 mg/dL (ref 0.61–1.24)
Creatinine, Ser: 0.73 mg/dL (ref 0.61–1.24)
GFR, Estimated: >60 mL/min (ref >60)
GFR, Estimated: >60 mL/min (ref >60)
Glucose, Bld: 75 mg/dL (ref 70–99)
Glucose, Bld: 79 mg/dL (ref 70–99)
Potassium: 3.7 mmol/L (ref 3.5–5.1)
Potassium: 4.1 mmol/L (ref 3.5–5.1)
Sodium: 123 mmol/L — ABNORMAL LOW (ref 135–145)
Sodium: 123 mmol/L — ABNORMAL LOW (ref 135–145)

## 2021-05-27 LAB — SODIUM, URINE, RANDOM: Sodium, Ur: 20 mmol/L

## 2021-05-27 LAB — OSMOLALITY, URINE: Osmolality, Ur: 115 mOsm/kg — ABNORMAL LOW (ref 300–900)

## 2021-05-27 LAB — CBC
HCT: 40.9 % (ref 39.0–52.0)
Hemoglobin: 14.2 g/dL (ref 13.0–17.0)
MCH: 33.3 pg (ref 26.0–34.0)
MCHC: 34.7 g/dL (ref 30.0–36.0)
MCV: 96 fL (ref 80.0–100.0)
Platelets: 254 10*3/uL (ref 150–400)
RBC: 4.26 MIL/uL (ref 4.22–5.81)
RDW: 12.8 % (ref 11.5–15.5)
WBC: 5.6 10*3/uL (ref 4.0–10.5)
nRBC: 0 % (ref 0.0–0.2)

## 2021-05-27 LAB — MAGNESIUM: Magnesium: 2.1 mg/dL (ref 1.7–2.4)

## 2021-05-27 LAB — PHOSPHORUS: Phosphorus: 3.5 mg/dL (ref 2.5–4.6)

## 2021-05-27 MED ORDER — NICOTINE 21 MG/24HR TD PT24
21.0000 mg | MEDICATED_PATCH | Freq: Every day | TRANSDERMAL | Status: DC
  Administered 2021-05-27 – 2021-05-29 (×3): 21 mg via TRANSDERMAL
  Filled 2021-05-27 (×3): qty 1

## 2021-05-27 MED ORDER — CLONIDINE HCL 0.1 MG PO TABS
0.1000 mg | ORAL_TABLET | Freq: Three times a day (TID) | ORAL | Status: AC
  Administered 2021-05-27 – 2021-05-28 (×3): 0.1 mg via ORAL
  Filled 2021-05-27 (×3): qty 1

## 2021-05-27 NOTE — Progress Notes (Signed)
PROGRESS NOTE    Alvin Benton  PTW:[id number] DOB: 04-Jan-1951 DOA: 05/26/2021 PCP: Judd Lien, PA-C    Brief Narrative:  Alvin Benton was admitted to the hospital with the working diagnosis of symptomatic hyponatremia.   70 year old male past medical history for alcohol abuse, atrial fibrillation, COPD and hypertension who presented with lightheadedness and near syncope episode.  Patient drinking about 6 beers daily, he reported lightheadedness mainly when standing, worse over the last few days.  He experienced a near syncope episode that prompted him to come to the hospital.  On his initial physical examination blood pressure 122/59, heart rate 69, respiratory rate 19, oxygen saturation 94%.  Moist mucous membranes, lungs clear to auscultation bilaterally, heart S1-S2, present, rhythmic, soft abdomen, no lower extremity patient was alert and awake, nonfocal.  Sodium 118, potassium 4.0, chloride 84, bicarb 22, glucose 112, BUN 7, creatinine 0.72, white count 6.1, hemoglobin 13.9, hematocrit 37.8, platelets 280. SARS COVID-19 negative. Urine osmolality 115, urinary sodium 20.  Alcohol level 83.  EKG 74 bpm, normal axis, normal intervals, sinus rhythm, J-point elevation V1-V3, no significant ST segment or T wave changes.  Assessment & Plan:   Principal Problem:   Hyponatremia Active Problems:   Alcohol abuse   Essential hypertension   Near syncope   History of atrial fibrillation   COPD (chronic obstructive pulmonary disease) (HCC)   Acute hypo-osmolar hyponatremia, suspected increase free water intake, beer potomania. Serum Na is up to 123, clinically euvolemic.  Plan to liberate diet to regular with no Na restriction. Continue free water restriction to 1200 ml per day. Follow up on renal function and electrolytes in am. Consult PT and OT, out of bed to chair tid with meals.  2. Alcohol abuse. Tremors have improved, no nausea or vomiting and tolerating po well. Continue  alcohol withdrawal with lorazepam per CIWA protocol. Neuro checks per unit protocol. OK to transfer to telemetry ward.   3. HTN. Continue blood pressure monitoring  4. COPD and tobacco abuse. No clinical signs of exacerbation, added nicotine patch  Continue with as needed albuterol.    Patient continue to be at high risk for worsening hyponatremia   Status is: Inpatient  Remains inpatient appropriate because:Inpatient level of care appropriate due to severity of illness  Dispo: The patient is from: Home              Anticipated d/c is to: Home              Patient currently is not medically stable to d/c.   Difficult to place patient No   DVT prophylaxis: Enoxaparin   Code Status:   full  Family Communication:  No family at the bedside     Subjective: Patient is feeling better, tremors have improved along with dizziness and light headedness. Na continue to be low.   Objective: Vitals:   05/27/21 0500 05/27/21 0600 05/27/21 0700 05/27/21 0800  BP:   135/73 129/60  Pulse: 68 83 71 87  Resp: 13 20 20 19   Temp:    97.8 F (36.6 C)  TempSrc:    Oral  SpO2: 92% 95% 93% 94%  Weight:      Height:        Intake/Output Summary (Last 24 hours) at 05/27/2021 0957 Last data filed at 05/27/2021 0800 Gross per 24 hour  Intake 1869.91 ml  Output 850 ml  Net 1019.91 ml   Filed Weights   05/26/21 1820 05/26/21 2307 05/27/21 0400  Weight: 81.6  kg 84.5 kg 86.3 kg    Examination:   General: Not in pain or dyspnea, deconditioned  Neurology: Awake and alert, non focal  E ENT: no pallor, no icterus, oral mucosa moist Cardiovascular: No JVD. S1-S2 present, rhythmic, no gallops or rubs, positive 3/6 murmur at the apex. No lower extremity edema. Pulmonary: positive breath sounds bilaterally, adequate air movement, no wheezing, rhonchi or rales. Gastrointestinal. Abdomen soft and non tender Skin. No rashes Musculoskeletal: no joint deformities     Data Reviewed: I have  personally reviewed following labs and imaging studies  CBC: Recent Labs  Lab 05/26/21 1903 05/27/21 0516  WBC 6.1 5.6  NEUTROABS 4.1  --   HGB 13.9 14.2  HCT 37.8* 40.9  MCV 92.2 96.0  PLT 280 254   Basic Metabolic Panel: Recent Labs  Lab 05/26/21 1903 05/27/21 0002 05/27/21 0516  NA 118* 123* 123*  K 4.0 3.7 4.1  CL 84* 89* 91*  CO2 22 22 23   GLUCOSE 102* 75 79  BUN 7* 7* 7*  CREATININE 0.72 0.70 0.73  CALCIUM 8.9 8.7* 8.9  MG  --   --  2.1  PHOS  --   --  3.5   GFR: Estimated Creatinine Clearance: 96.1 mL/min (by C-G formula based on SCr of 0.73 mg/dL). Liver Function Tests: Recent Labs  Lab 05/26/21 1903  AST 39  ALT 32  ALKPHOS 48  BILITOT 0.9  PROT 6.4*  ALBUMIN 3.9   No results for input(s): LIPASE, AMYLASE in the last 168 hours. No results for input(s): AMMONIA in the last 168 hours. Coagulation Profile: Recent Labs  Lab 05/26/21 1903  INR 0.9   Cardiac Enzymes: No results for input(s): CKTOTAL, CKMB, CKMBINDEX, TROPONINI in the last 168 hours. BNP (last 3 results) No results for input(s): PROBNP in the last 8760 hours. HbA1C: No results for input(s): HGBA1C in the last 72 hours. CBG: Recent Labs  Lab 05/26/21 1850  GLUCAP 113*   Lipid Profile: No results for input(s): CHOL, HDL, LDLCALC, TRIG, CHOLHDL, LDLDIRECT in the last 72 hours. Thyroid Function Tests: No results for input(s): TSH, T4TOTAL, FREET4, T3FREE, THYROIDAB in the last 72 hours. Anemia Panel: No results for input(s): VITAMINB12, FOLATE, FERRITIN, TIBC, IRON, RETICCTPCT in the last 72 hours.    Radiology Studies: I have reviewed all of the imaging during this hospital visit personally     Scheduled Meds:  Chlorhexidine Gluconate Cloth  6 each Topical Q0600   enoxaparin (LOVENOX) injection  40 mg Subcutaneous Q24H   folic acid  1 mg Oral Daily   LORazepam  0-4 mg Intravenous Q6H   Followed by   05/28/21 ON 05/29/2021] LORazepam  0-4 mg Intravenous Q12H    multivitamin with minerals  1 tablet Oral Daily   nicotine  21 mg Transdermal Daily   thiamine  100 mg Oral Daily   Or   thiamine  100 mg Intravenous Daily   Continuous Infusions:   LOS: 1 day        Alvin Benton 07/29/2021, MD

## 2021-05-28 DIAGNOSIS — J449 Chronic obstructive pulmonary disease, unspecified: Secondary | ICD-10-CM | POA: Diagnosis not present

## 2021-05-28 DIAGNOSIS — I10 Essential (primary) hypertension: Secondary | ICD-10-CM | POA: Diagnosis not present

## 2021-05-28 DIAGNOSIS — F1010 Alcohol abuse, uncomplicated: Secondary | ICD-10-CM | POA: Diagnosis not present

## 2021-05-28 DIAGNOSIS — E871 Hypo-osmolality and hyponatremia: Secondary | ICD-10-CM | POA: Diagnosis not present

## 2021-05-28 LAB — BASIC METABOLIC PANEL
Anion gap: 7 (ref 5–15)
BUN: 13 mg/dL (ref 8–23)
CO2: 24 mmol/L (ref 22–32)
Calcium: 8.8 mg/dL — ABNORMAL LOW (ref 8.9–10.3)
Chloride: 94 mmol/L — ABNORMAL LOW (ref 98–111)
Creatinine, Ser: 0.67 mg/dL (ref 0.61–1.24)
GFR, Estimated: >60 mL/min (ref >60)
Glucose, Bld: 111 mg/dL — ABNORMAL HIGH (ref 70–99)
Potassium: 4.3 mmol/L (ref 3.5–5.1)
Sodium: 125 mmol/L — ABNORMAL LOW (ref 135–145)

## 2021-05-28 MED ORDER — MENTHOL 3 MG MT LOZG
1.0000 | LOZENGE | OROMUCOSAL | Status: DC | PRN
  Administered 2021-05-28: 3 mg via ORAL
  Filled 2021-05-28: qty 9

## 2021-05-28 NOTE — Progress Notes (Signed)
PROGRESS NOTE    Alvin Benton  YTK:[id number] DOB: 05-Dec-1950 DOA: 05/26/2021 PCP: Judd Lien, PA-C    Brief Narrative:  Alvin Benton was admitted to the hospital with the working diagnosis of symptomatic hyponatremia.    70 year old male past medical history for alcohol abuse, atrial fibrillation, COPD and hypertension who presented with lightheadedness and near syncope episode.  Patient drinking about 6 beers daily, he reported lightheadedness mainly when standing, worse over the last few days.  He experienced a near syncope episode that prompted him to come to the hospital.  On his initial physical examination blood pressure 122/59, heart rate 69, respiratory rate 19, oxygen saturation 94%.  Moist mucous membranes, lungs clear to auscultation bilaterally, heart S1-S2, present, rhythmic, soft abdomen, no lower extremity patient was alert and awake, nonfocal.   Sodium 118, potassium 4.0, chloride 84, bicarb 22, glucose 112, BUN 7, creatinine 0.72, white count 6.1, hemoglobin 13.9, hematocrit 37.8, platelets 280. SARS COVID-19 negative. Urine osmolality 115, urinary sodium 20.   Alcohol level 83.   EKG 74 bpm, normal axis, normal intervals, sinus rhythm, J-point elevation V1-V3, no significant ST segment or T wave changes.  Patient placed on fluid restriction and alcohol withdrawal protocol with good toleration.  Na is improving, plan to transfer to med surg ward.    Assessment & Plan:   Principal Problem:   Hyponatremia Active Problems:   Alcohol abuse   Essential hypertension   Near syncope   History of atrial fibrillation   COPD (chronic obstructive pulmonary disease) (HCC)    Acute hypo-osmolar hyponatremia, suspected increase free water intake, polydipsia, beer potomania.  Continue to be euvolemic, serum Na is up to 125 today, renal function with stable cr at 0,67 and bicarbonate at 24.    Plan to continue fluid restriction to 1200 ml per day and follow up renal  function in am. Unrestricted salt diet.    2. Alcohol abuse. This am wit no tremors or anxiety, no nausea or vomiting.  Patient had 6 mg of lorazepam yesterday.   Plan to continue alcohol withdrawal with lorazepam per CIWA protocol. Ok to discontinue telemetry monitoring.    3. HTN. Blood pressure has remained stable.    4. COPD and tobacco abuse. No signs of acute exacerbation, added nicotine patch PRN albuterol.    Status is: Inpatient  Remains inpatient appropriate because:Inpatient level of care appropriate due to severity of illness  Dispo: The patient is from: Home              Anticipated d/c is to: Home              Patient currently is not medically stable to d/c.   Difficult to place patient No   DVT prophylaxis: Enoxaparin   Code Status:   full  Family Communication:  No family at the bedside       Subjective: Patient is feeling better, no nausea or vomiting, tremors and anxiety have improved. No chest pain or dyspnea.   Objective: Vitals:   05/28/21 0500 05/28/21 0600 05/28/21 0801 05/28/21 0857  BP:  (!) 171/66    Pulse: (!) 58 62    Resp: 11 17    Temp:   (!) 97.4 F (36.3 C)   TempSrc:   Axillary   SpO2: 95% 97%  100%  Weight: 84.1 kg     Height:        Intake/Output Summary (Last 24 hours) at 05/28/2021 0926 Last data filed at 05/28/2021 [phone]  Gross per 24 hour  Intake 0 ml  Output 2300 ml  Net -2300 ml   Filed Weights   05/26/21 2307 05/27/21 0400 05/28/21 0500  Weight: 84.5 kg 86.3 kg 84.1 kg    Examination:   General: Not in pain or dyspnea.  Neurology: Awake and alert, non focal. No tremors or anxiety.  E ENT: no pallor, no icterus, oral mucosa moist Cardiovascular: No JVD. S1-S2 present, rhythmic, no gallops, rubs, or murmurs. No lower extremity edema. Pulmonary: positive breath sounds bilaterally, adequate air movement, no wheezing, rhonchi or rales. Gastrointestinal. Abdomen soft and non tender Skin. No rashes Musculoskeletal:  no joint deformities     Data Reviewed: I have personally reviewed following labs and imaging studies  CBC: Recent Labs  Lab 05/26/21 1903 05/27/21 0516  WBC 6.1 5.6  NEUTROABS 4.1  --   HGB 13.9 14.2  HCT 37.8* 40.9  MCV 92.2 96.0  PLT 280 254   Basic Metabolic Panel: Recent Labs  Lab 05/26/21 1903 05/27/21 0002 05/27/21 0516 05/28/21 0222  NA 118* 123* 123* 125*  K 4.0 3.7 4.1 4.3  CL 84* 89* 91* 94*  CO2 22 22 23 24   GLUCOSE 102* 75 79 111*  BUN 7* 7* 7* 13  CREATININE 0.72 0.70 0.73 0.67  CALCIUM 8.9 8.7* 8.9 8.8*  MG  --   --  2.1  --   PHOS  --   --  3.5  --    GFR: Estimated Creatinine Clearance: 88.4 mL/min (by C-G formula based on SCr of 0.67 mg/dL). Liver Function Tests: Recent Labs  Lab 05/26/21 1903  AST 39  ALT 32  ALKPHOS 48  BILITOT 0.9  PROT 6.4*  ALBUMIN 3.9   No results for input(s): LIPASE, AMYLASE in the last 168 hours. No results for input(s): AMMONIA in the last 168 hours. Coagulation Profile: Recent Labs  Lab 05/26/21 1903  INR 0.9   Cardiac Enzymes: No results for input(s): CKTOTAL, CKMB, CKMBINDEX, TROPONINI in the last 168 hours. BNP (last 3 results) No results for input(s): PROBNP in the last 8760 hours. HbA1C: No results for input(s): HGBA1C in the last 72 hours. CBG: Recent Labs  Lab 05/26/21 1850  GLUCAP 113*   Lipid Profile: No results for input(s): CHOL, HDL, LDLCALC, TRIG, CHOLHDL, LDLDIRECT in the last 72 hours. Thyroid Function Tests: No results for input(s): TSH, T4TOTAL, FREET4, T3FREE, THYROIDAB in the last 72 hours. Anemia Panel: No results for input(s): VITAMINB12, FOLATE, FERRITIN, TIBC, IRON, RETICCTPCT in the last 72 hours.    Radiology Studies: I have reviewed all of the imaging during this hospital visit personally     Scheduled Meds:  Chlorhexidine Gluconate Cloth  6 each Topical Q0600   cloNIDine  0.1 mg Oral TID   folic acid  1 mg Oral Daily   LORazepam  0-4 mg Intravenous Q6H    Followed by   05/28/21 ON 05/29/2021] LORazepam  0-4 mg Intravenous Q12H   multivitamin with minerals  1 tablet Oral Daily   nicotine  21 mg Transdermal Daily   thiamine  100 mg Oral Daily   Continuous Infusions:   LOS: 2 days        Alvin Benton 07/29/2021, MD

## 2021-05-28 NOTE — TOC Initial Note (Signed)
Transition of Care [facility]) - Initial/Assessment Note    Patient Details  Name: Alvin Benton MRN: [id number] Date of Birth: 04/14/1951  [facility] CM/SW Contact:    Golda Acre, RN Phone Number: 05/28/2021, 8:41 AM  Clinical Narrative:                 admitted to the hospital with the working diagnosis of symptomatic hyponatremia.    70 year old male past medical history for alcohol abuse, atrial fibrillation, COPD and hypertension who presented with lightheadedness and near syncope episode.  Patient drinking about 6 beers daily, he reported lightheadedness mainly when standing, worse over the last few days.  He experienced a near syncope episode that prompted him to come to the hospital.  On his initial physical examination blood pressure 122/59, heart rate 69, respiratory rate 19, oxygen saturation 94%.  Moist mucous membranes, lungs clear to auscultation bilaterally, heart S1-S2, present, rhythmic, soft abdomen, no lower extremity patient was alert and awake, nonfocal.   Sodium 118, potassium 4.0, chloride 84, bicarb 22, glucose 112, BUN 7, creatinine 0.72, white count 6.1, hemoglobin 13.9, hematocrit 37.8, platelets 280. SARS COVID-19 negative. Urine osmolality 115, urinary sodium 20.   Alcohol level 83.   EKG 74 bpm, normal axis, normal intervals, sinus rhythm, J-point elevation V1-V3, no significant ST segment or T wave changes.   Assessment & Plan:   Principal Problem:   Hyponatremia Active Problems:   Alcohol abuse   Essential hypertension   Near syncope   History of atrial fibrillation   COPD (chronic obstructive pulmonary disease) (HCC)     Acute hypo-osmolar hyponatremia, suspected increase free water intake, beer potomania. Serum Na is up to 123, clinically euvolemic.   Plan to liberate diet to regular with no Na restriction. Continue free water restriction to 1200 ml per day. Follow up on renal function and electrolytes in am. Consult PT and OT,  out of bed to chair tid with meals.   2. Alcohol abuse. Tremors have improved, no nausea or vomiting and tolerating po well. Continue alcohol withdrawal with lorazepam per CIWA protocol. Neuro checks per unit protocol. OK to transfer to telemetry ward.   3. HTN. Continue blood pressure monitoring   4. COPD and tobacco abuse. No clinical signs of exacerbation, added nicotine patch Continue with as needed albuterol.  TOC PF PLAN OF CARE: Following for progression Will need substance abuse resources when stable, Na-125 this am Expected Discharge Plan: Home/Self Care Barriers to Discharge: Continued Medical Work up   Patient Goals and CMS Choice Patient states their goals for this hospitalization and ongoing recovery are:: to go back home CMS Medicare.gov Compare Post Acute Care list provided to:: Patient    Expected Discharge Plan and Services Expected Discharge Plan: Home/Self Care   Discharge Planning Services: CM Consult   Living arrangements for the past 2 months: Apartment                                      Prior Living Arrangements/Services Living arrangements for the past 2 months: Apartment Lives with:: Self Patient language and need for interpreter reviewed:: Yes Do you feel safe going back to the place where you live?: Yes            Criminal Activity/Legal Involvement Pertinent to Current Situation/Hospitalization: No - Comment as needed  Activities of Daily Living Home Assistive Devices/Equipment: [place] Mor (specify quad or  straight) ADL Screening (condition at time of admission) Patient's cognitive ability adequate to safely complete daily activities?: Yes Is the patient deaf or have difficulty hearing?: No Does the patient have difficulty seeing, even when wearing glasses/contacts?: No Does the patient have difficulty concentrating, remembering, or making decisions?: No Patient able to express need for assistance with ADLs?: No Does the patient have  difficulty dressing or bathing?: No Independently performs ADLs?: Yes (appropriate for developmental age) Does the patient have difficulty walking or climbing stairs?: Yes Weakness of Legs: Both Weakness of Arms/Hands: None  Permission Sought/Granted                  Emotional Assessment Appearance:: Appears stated age Attitude/Demeanor/Rapport: Engaged Affect (typically observed): Calm Orientation: : Oriented to Self, Oriented to Place, Oriented to  Time, Oriented to Situation Alcohol / Substance Use: Not Applicable Psych Involvement: No (comment)  Admission diagnosis:  Alcohol abuse [F10.10] Hyponatremia [E87.1] Patient Active Problem List   Diagnosis Date Noted   Hyponatremia 05/26/2021   COPD (chronic obstructive pulmonary disease) (HCC) 05/26/2021   Chronic back pain greater than 3 months duration 11/21/2020   Elevated troponin    Nausea & vomiting 07/21/2017   Alcoholic ketoacidosis 07/21/2017   Dehydration 05/20/2017   Near syncope 02/26/2017   History of atrial fibrillation 02/26/2017   Right flank hematoma 02/26/2017   Zygomatic fracture, right side, initial encounter for closed fracture (HCC) 02/26/2017   Dizziness    Fall    Dehydration with hyponatremia 02/20/2017   Volume depletion 06/04/2016   Diverticulitis of intestine with perforation without bleeding 06/04/2016   Essential hypertension    Hypomagnesemia    Systolic murmur 05/05/2016   Anxiety state 04/19/2016   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   Atrial fibrillation (HCC) 04/05/2016   Diverticulitis of large intestine with perforation and abscess 03/14/2016   Chronic constipation 03/08/2016   Alcohol abuse 02/15/2016   Tobacco use disorder 07/17/2015   Allergic rhinitis 07/17/2015   Reactive airway disease with wheezing 07/17/2015   Overweight (BMI 25.0-29.9) 07/17/2015   Hand pain, left 01/12/2014   LLQ pain-chronic, intermittent and episodic 09/17/2013   PCP:  Judd Lien, PA-C Pharmacy:    [facility] [phone] [address], [place] - [address] [place] [address] Phone: [phone] Fax: [phone]     Social Determinants of Health (SDOH) Interventions    Readmission Risk Interventions No flowsheet data found.

## 2021-05-28 NOTE — Evaluation (Signed)
Occupational Therapy Evaluation Patient Details Name: Alvin Benton MRN: [id number] DOB: April 23, 1951 Today's Date: 05/28/2021    History of Present Illness 70 y.o. male presenting s/p syncopal episode at [facility] foods with EMS called. 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 PMHx significant for A-fib, HTN, heavy ETOH use, COPD, tobacco use, IBS   Clinical Impression   Patient is currently requiring minimal assistance with all ADLs excluding feeding.  Pt required Min As to stand from EOB and Moderate assis to safely pivot to recliner after ambulating ~12' in room with RW.   Patient's typical baseline is Modified independent, however pt reporting increased struggles with shopping for needed items and is searching for help with this.  During this evaluation, patient was limited by poor standing balance and poor standing tolerance with 0/10 pain at rest but 9/10 pain with 3 min stand for orthostatic vitals, as well as mild cognitive deficits and generalized weakness, which has the potential to impact patient's safety and independence during functional mobility, as well as performance for ADLs. [employer] AM-PAC "6-clicks" Daily Activity Inpatient Short Form score of 19/24 this session. Patient lives alone with his cat, Alvin Benton and has no outside support system.  Patient demonstrates good rehab potential, and should benefit from continued skilled occupational therapy services while in acute care to maximize safety, independence and quality of life at home.  Continued occupational therapy services in a SNF setting prior to return home is recommended, however pt is currently refusing SNF and agreeable to home health OT.  ?    Follow Up Recommendations  SNF (Pt is refusing SNF.  Pt agreeable to home health OT. If pt continues to refuse SNF, would recommend home health)    Equipment Recommendations  Other (comment) (Rollator and reacher/adaptive equipment.)     Recommendations for Other Services       Precautions / Restrictions Precautions Precautions: Fall Precaution Comments: CIWA protocol Restrictions Weight Bearing Restrictions: No      Mobility Bed Mobility Overal bed mobility: Needs Assistance Bed Mobility: Supine to Sit     Supine to sit: Supervision;HOB elevated          Transfers Overall transfer level: Needs assistance   Transfers: Sit to/from Stand;Stand Pivot Transfers Sit to Stand: Min assist Stand pivot transfers: Mod assist       General transfer comment: See Toilet transfer note    Balance Overall balance assessment: Needs assistance Sitting-balance support: Feet supported Sitting balance-Leahy Scale: Good     Standing balance support: Bilateral upper extremity supported;During functional activity Standing balance-Leahy Scale: Poor                             ADL either performed or assessed with clinical judgement   ADL Overall ADL's : Needs assistance/impaired Eating/Feeding: Independent   Grooming: Wash/dry hands;Sitting;Set up   Upper Body Bathing: Sitting;Set up   Lower Body Bathing: Minimal assistance;Sitting/lateral leans;Sit to/from stand   Upper Body Dressing : Sitting;Set up Upper Body Dressing Details (indicate cue type and reason): To don posterior gown. Assist for lines only. Lower Body Dressing: Minimal assistance;Set up;Supervision/safety;Sit to/from stand;Sitting/lateral leans Lower Body Dressing Details (indicate cue type and reason): Able to demonstrate figure 4 position while in recliner.  Standing would need at least Min As for safety. Toilet Transfer: Ambulation;RW;Moderate Dentist Details (indicate cue type and reason): Simulated to recliner. Pt ambulated around bed to chair ~12' with RW and stooped  posture with shuffling gait. Min As for ambulation, Mod As to pivot safely to recliner due to pt sitting early, expressing that his back is causing  his LEs to buckle. Toileting- Clothing Manipulation and Hygiene: Minimal assistance Toileting - Clothing Manipulation Details (indicate cue type and reason): Min As to stand with urinal-pt assisted with full setup for use of urinal and pt able to hold himself.     Functional mobility during ADLs: Minimal assistance;Moderate assistance;Rolling walker;Cueing for sequencing General ADL Comments: Generally unsafe with walker, often stepping outside of walker. Needs cues to focus on task at hand for safety.     Vision Baseline Vision/History: Wears glasses Wears Glasses: Reading only Vision Assessment?: No apparent visual deficits Additional Comments: Needed assist using phone to order breakfast after 3 attempts to dial number with mistakes.     Perception     Praxis      Pertinent Vitals/Pain Pain Assessment: 0-10 Pain Score: 9  Pain Location: 9/10 lower back pain with 3 min stand wfor orthostatics. No pain at rest. Once in chair pt reported comfort. Pain Descriptors / Indicators: Burning Pain Intervention(s): Limited activity within patient's tolerance;Monitored during session;Repositioned     Hand Dominance Right (Ambidextrous)   Extremity/Trunk Assessment Upper Extremity Assessment Upper Extremity Assessment: Generalized weakness   Lower Extremity Assessment Lower Extremity Assessment: Defer to PT evaluation   Cervical / Trunk Assessment Cervical / Trunk Assessment: Kyphotic   Communication Communication Communication: No difficulties   Cognition Arousal/Alertness: Awake/alert Behavior During Therapy: Restless Overall Cognitive Status: No family/caregiver present to determine baseline cognitive functioning                                 General Comments: Pt is easily distracted and needs cues to stay on task. Thoughts mildy disorganzed.   General Comments       Exercises     Shoulder Instructions      Home Living Family/patient expects to be  discharged to:: Private residence Living Arrangements: Alone Available Help at Discharge:  (None) Type of Home: Apartment Home Access: Level entry (Pt recently moved to ground floor apartment.)     Home Layout: One level     Bathroom Shower/Tub: Chief Strategy Officer: Standard     Home Equipment: Environmental consultant - 2 wheels;Tub bench;Grab bars - tub/shower;Hand held shower head;Cane - single point   Additional Comments: Pt reports there is a long walk from parking ot his apartment and pt must stop and sit on apartment steps to rest.      Prior Functioning/Environment Level of Independence: Needs assistance  Gait / Transfers Assistance Needed: Pt denies falls for past 12 month. Pt reports use of 2WW inside apartment and use of SPC outside. Pt reports ongoing diffiuclty with prolonged standing due to back pain. ADL's / Homemaking Assistance Needed: Mod I at baseline, but reports that bathing and dressing are a struggle. Recently reports inability to do laundry due to clogs in line that pt has not been able to address. Patient also reports limited ability to shop for necessary items and is looking for part time help with groceries and other errands.   Comments: Reports no falls.        OT Problem List: Pain;Decreased strength;Cardiopulmonary status limiting activity;Decreased cognition;Decreased activity tolerance;Decreased safety awareness;Impaired balance (sitting and/or standing);Decreased knowledge of use of DME or AE      OT Treatment/Interventions: Self-care/ADL training;Therapeutic exercise;Therapeutic activities;Cognitive remediation/compensation;Energy conservation;Patient/family  education;DME and/or AE instruction;Balance training    OT Goals(Current goals can be found in the care plan section) Acute Rehab OT Goals Patient Stated Goal: "Get home to pay rent." OT Goal Formulation: With patient Time For Goal Achievement: 06/11/21 Potential to Achieve Goals: Good ADL  Goals Pt Will Perform Lower Body Dressing: with adaptive equipment;with modified independence Pt Will Transfer to Toilet: with modified independence;ambulating (demonstrating intact safety awareness.) Pt Will Perform Toileting - Clothing Manipulation and hygiene: with modified independence;with adaptive equipment  OT Frequency: Min 2X/week   Barriers to D/C: Decreased caregiver support  Lives alone with no support system available. Reprots struggling with ADLs.       Co-evaluation              AM-PAC OT "6 Clicks" Daily Activity     Outcome Measure Help from another person eating meals?: None Help from another person taking care of personal grooming?: A Little Help from another person toileting, which includes using toliet, bedpan, or urinal?: A Little Help from another person bathing (including washing, rinsing, drying)?: A Little Help from another person to put on and taking off regular upper body clothing?: A Little Help from another person to put on and taking off regular lower body clothing?: A Little 6 Click Score: 19   End of Session Equipment Utilized During Treatment: Gait belt;Rolling walker Nurse Communication:  (RN cleared OT to see pt.)  Activity Tolerance: Patient limited by pain Patient left: in chair;with call bell/phone within reach  OT Visit Diagnosis: Unsteadiness on feet (R26.81);Muscle weakness (generalized) (M62.81);Pain Pain - part of body:  (lower back)                Time: 5400-8676 OT Time Calculation (min): 40 min Charges:  OT General Charges $OT Visit: 1 Visit OT Evaluation $OT Eval Moderate Complexity: 1 Mod OT Treatments $Self Care/Home Management : 8-22 mins $Therapeutic Activity: 8-22 mins  Victorino Dike, OT Acute Rehab Services Office: [phone] 05/28/2021  Theodoro Clock 05/28/2021, 9:15 AM

## 2021-05-28 NOTE — Progress Notes (Signed)
PT Cancellation Note  Patient Details Name: Alvin Benton MRN: [id number] DOB: 04/26/1951   Cancelled Treatment:    Reason Eval/Treat Not Completed: Medical issues which prohibited therapy--BP still elevated. Will hold PT for now and check back another time    Faye Ramsay, PT Acute Rehabilitation  Office: [phone] Pager: [phone]

## 2021-05-29 DIAGNOSIS — E871 Hypo-osmolality and hyponatremia: Secondary | ICD-10-CM | POA: Diagnosis not present

## 2021-05-29 DIAGNOSIS — F1010 Alcohol abuse, uncomplicated: Secondary | ICD-10-CM | POA: Diagnosis not present

## 2021-05-29 DIAGNOSIS — J449 Chronic obstructive pulmonary disease, unspecified: Secondary | ICD-10-CM | POA: Diagnosis not present

## 2021-05-29 DIAGNOSIS — Z8679 Personal history of other diseases of the circulatory system: Secondary | ICD-10-CM | POA: Diagnosis not present

## 2021-05-29 LAB — BASIC METABOLIC PANEL
Anion gap: 5 (ref 5–15)
BUN: 13 mg/dL (ref 8–23)
CO2: 24 mmol/L (ref 22–32)
Calcium: 9 mg/dL (ref 8.9–10.3)
Chloride: 100 mmol/L (ref 98–111)
Creatinine, Ser: 0.78 mg/dL (ref 0.61–1.24)
GFR, Estimated: >60 mL/min (ref >60)
Glucose, Bld: 101 mg/dL — ABNORMAL HIGH (ref 70–99)
Potassium: 4.5 mmol/L (ref 3.5–5.1)
Sodium: 129 mmol/L — ABNORMAL LOW (ref 135–145)

## 2021-05-29 MED ORDER — ACETAMINOPHEN 325 MG PO TABS: 650.0000 mg | ORAL_TABLET | Freq: Four times a day (QID) | ORAL | Status: DC | PRN

## 2021-05-29 MED ORDER — LISINOPRIL 20 MG PO TABS: 20.0000 mg | ORAL_TABLET | Freq: Every day | ORAL | 0 refills | Status: DC

## 2021-05-29 MED ORDER — LISINOPRIL 20 MG PO TABS
20.0000 mg | ORAL_TABLET | Freq: Every day | ORAL | Status: DC
  Administered 2021-05-29: 20 mg via ORAL
  Filled 2021-05-29: qty 1

## 2021-05-29 MED ORDER — SODIUM CHLORIDE 1 G PO TABS: 1.0000 g | ORAL_TABLET | Freq: Two times a day (BID) | ORAL | 0 refills | Status: DC

## 2021-05-29 MED ORDER — SODIUM CHLORIDE 1 G PO TABS
1.0000 g | ORAL_TABLET | Freq: Once | ORAL | Status: AC
  Administered 2021-05-29: 1 g via ORAL
  Filled 2021-05-29: qty 1

## 2021-05-29 NOTE — Discharge Summary (Addendum)
Physician Discharge Summary  Harlee Pursifull ZOX:[id number] DOB: November 04, 1950 DOA: 05/26/2021  PCP: Judd Lien, PA-C  Admit date: 05/26/2021 Discharge date: 05/29/2021  Admitted From: Home  Disposition:  Home   Recommendations for Outpatient Follow-up and new medication changes:  Follow up with Irving Copas PA-C Follow up renal panel in 7 days.  Patient advised to continue with fluids restriction and to avoid beer.  Patient declined SNF, will arrange for home health services.  Continue blood pressure control with lisinopril, will dc HCTZ due to hyponatremia.   Home Health: yes  Equipment/Devices: na   Discharge Condition: stable  CODE STATUS: full  Diet recommendation:  regular diet with fluid restriction 1200 ml per day.   Brief/Interim Summary: Mr. 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    70 year old male past medical history for alcohol abuse, atrial fibrillation, COPD and hypertension who presented with lightheadedness and near syncope episode.  Patient drinking about 6 beers daily, he reported lightheadedness mainly when standing, worse over the last few days.  He experienced a near syncope episode that prompted him to come to the hospital.  On his initial physical examination blood pressure 122/59, heart rate 69, respiratory rate 19, oxygen saturation 94%.  Moist mucous membranes, lungs clear to auscultation bilaterally, heart S1-S2, present, rhythmic, soft abdomen, no lower extremity patient was alert and awake, nonfocal.   Sodium 118, potassium 4.0, chloride 84, bicarb 22, glucose 112, BUN 7, creatinine 0.72, white count 6.1, hemoglobin 13.9, hematocrit 37.8, platelets 280. SARS COVID-19 negative. Urine osmolality 115, urinary sodium 20.   Alcohol level 83.   EKG 74 bpm, normal axis, normal intervals, sinus rhythm, J-point elevation V1-V3, no significant ST segment or T wave changes.   Patient placed on  fluid restriction and alcohol withdrawal protocol with good toleration.   Na continue to improve, patient declined SNF and very anxious to go home.   Acute hypoosmolar hyponatremia, polydipsia, beer [place] mania. Patient was admitted to the stepdown unit, he was placed on fluid restriction and a unrestricted soft diet. Had close neurologic monitoring. His symptoms improved along with his serum sodium.  At discharge sodium was 129, potassium 4.5, chloride 100, bicarb 24, BUN 13, creatinine 0.78. Patient was advised about avoiding alcohol and excessive free water intake. Will need a repeat renal function panel later this week or next week. Considering continue improvement in serum Na will allow patient to be discharge, with close outpatient supervision, will order home health services.  Will add 1 g salt tablet once before discharge.  I explained him that I advice him to stay to further correct Na, but he is insisting in going home today.   2.  Alcohol abuse.  Positive alcohol withdrawal syndrome. He was placed on lorazepam as needed per CIWA protocol with good response. Patient was advised to avoid further alcohol consumption.  3.  Hypertension.  His blood pressure remained stable.  4.  COPD/tobacco abuse.  No signs of acute exacerbation, patient received nicotine patch during his hospitalization.  Discharge Diagnoses:  Principal Problem:   Hyponatremia Active Problems:   Alcohol abuse   Essential hypertension   Near syncope   History of atrial fibrillation   COPD (chronic obstructive pulmonary disease) (HCC)    Discharge Instructions   Mr. Davenport was admitted to the hospital with the working diagnosis of symptomatic hyponatremia.    70 year old male past medical history for alcohol abuse, atrial fibrillation, COPD and hypertension  who presented with lightheadedness and near syncope episode.  Patient drinking about 6 beers daily, he reported lightheadedness mainly when standing,  worse over the last few days.  He experienced a near syncope episode that prompted him to come to the hospital.  On his initial physical examination blood pressure 122/59, heart rate 69, respiratory rate 19, oxygen saturation 94%.  Moist mucous membranes, lungs clear to auscultation bilaterally, heart S1-S2, present, rhythmic, soft abdomen, no lower extremity patient was alert and awake, nonfocal.   Sodium 118, potassium 4.0, chloride 84, bicarb 22, glucose 112, BUN 7, creatinine 0.72, white count 6.1, hemoglobin 13.9, hematocrit 37.8, platelets 280. SARS COVID-19 negative. Urine osmolality 115, urinary sodium 20.   Alcohol level 83.   EKG 74 bpm, normal axis, normal intervals, sinus rhythm, J-point elevation V1-V3, no significant ST segment or T wave changes.   Patient placed on fluid restriction and alcohol withdrawal protocol with good toleration.   Na is improving, plan to transfer to med surg ward.   No Known Allergies  Allergies as of 05/29/2021   No Known Allergies      Medication List     STOP taking these medications    folic acid 1 MG tablet Commonly known as: FOLVITE   gabapentin 300 MG capsule Commonly known as: NEURONTIN   lidocaine 5 % Commonly known as: LIDODERM   lisinopril-hydrochlorothiazide 20-25 MG tablet Commonly known as: ZESTORETIC   nicotine 21 mg/24hr patch Commonly known as: NICODERM CQ - dosed in mg/24 hours   oxyCODONE 5 MG immediate release tablet Commonly known as: Oxy IR/ROXICODONE   thiamine 100 MG tablet       TAKE these medications    acetaminophen 325 MG tablet Commonly known as: TYLENOL Take 2 tablets (650 mg total) by mouth every 6 (six) hours as needed for mild pain (or Fever >/= 101).   albuterol 108 (90 Base) MCG/ACT inhaler Commonly known as: VENTOLIN HFA Inhale 2 puffs into the lungs every 6 (six) hours as needed for wheezing or shortness of breath.   EXCEDRIN PO Take 2 tablets by mouth every morning.   lisinopril  20 MG tablet Commonly known as: ZESTRIL Take 1 tablet (20 mg total) by mouth daily.   sodium chloride 1 g tablet Take 1 tablet (1 g total) by mouth 2 (two) times daily with a meal.        Procedures/Studies: No results found.    Subjective: Patient is feeling better, and very anxious to go home. " Will go home ama if I am not discharged".  He understands Na still low, and need to continue treatment at home with fluids restrictions.  Patient competent to make decisions, understands  the consequences of low sodium, including falls, confusion and altered mental status.   Discharge Exam: Vitals:   05/28/21 2015 05/29/21 0404  BP: (!) 141/70 (!) 165/63  Pulse: 63 64  Resp: 16 18  Temp: 97.6 F (36.4 C) (!) 97.3 F (36.3 C)  SpO2: 97% 100%   Vitals:   05/28/21 1700 05/28/21 1814 05/28/21 2015 05/29/21 0404  BP:  (!) 175/74 (!) 141/70 (!) 165/63  Pulse: 71 69 63 64  Resp:   16 18  Temp:  97.7 F (36.5 C) 97.6 F (36.4 C) (!) 97.3 F (36.3 C)  TempSrc:  Axillary Oral Oral  SpO2:  100% 97% 100%  Weight:      Height:        General: Not in pain or dyspnea  Neurology: Awake  and alert, non focal, no tremors.  E ENT: no pallor, no icterus, oral mucosa moist Cardiovascular: No JVD. S1-S2 present, rhythmic, no gallops, rubs, or murmurs. No lower extremity edema. Pulmonary: positive breath sounds bilaterally, adequate air movement, no wheezing, rhonchi or rales. Gastrointestinal. Abdomen  soft and non tender Skin. No rashes Musculoskeletal: no joint deformities   The results of significant diagnostics from this hospitalization (including imaging, microbiology, ancillary and laboratory) are listed below for reference.     Microbiology: Recent Results (from the past 240 hour(s))  Resp Panel by RT-PCR (Flu A&B, Covid) Nasopharyngeal Swab     Status: None   Collection Time: 05/26/21  9:55 PM   Specimen: Nasopharyngeal Swab; Nasopharyngeal(NP) swabs in vial transport medium   Result Value Ref Range Status   SARS Coronavirus 2 by RT PCR NEGATIVE NEGATIVE Final    Comment: (NOTE) SARS-CoV-2 target nucleic acids are NOT DETECTED.  The SARS-CoV-2 RNA is generally detectable in upper respiratory specimens during the acute phase of infection. The lowest concentration of SARS-CoV-2 viral copies this assay can detect is 138 copies/mL. A negative result does not preclude SARS-Cov-2 infection and should not be used as the sole basis for treatment or other patient management decisions. A negative result may occur with  improper specimen collection/handling, submission of specimen other than nasopharyngeal swab, presence of viral mutation(s) within the areas targeted by this assay, and inadequate number of viral copies(<138 copies/mL). A negative result must be combined with clinical observations, patient history, and epidemiological information. The expected result is Negative.  Fact Sheet for Patients:  BloggerCourse.comhttps://www.fda.gov/media/152166/download  Fact Sheet for Healthcare Providers:  SeriousBroker.ithttps://www.fda.gov/media/152162/download  This test is no t yet approved or cleared by the [place] FDA and  has been authorized for detection and/or diagnosis of SARS-CoV-2 by FDA under an Emergency Use Authorization (EUA). This EUA will remain  in effect (meaning this test can be used) for the duration of the COVID-19 declaration under Section 564(b)(1) of the Act, 21 U.S.C.section 360bbb-3(b)(1), unless the authorization is terminated  or revoked sooner.       Influenza A by PCR NEGATIVE NEGATIVE Final   Influenza B by PCR NEGATIVE NEGATIVE Final    Comment: (NOTE) The Xpert Xpress SARS-CoV-2/FLU/RSV plus assay is intended as an aid in the diagnosis of influenza from Nasopharyngeal swab specimens and should not be used as a sole basis for treatment. Nasal washings and aspirates are unacceptable for Xpert Xpress SARS-CoV-2/FLU/RSV testing.  Fact Sheet for  Patients: BloggerCourse.comhttps://www.fda.gov/media/152166/download  Fact Sheet for Healthcare Providers: SeriousBroker.ithttps://www.fda.gov/media/152162/download  This test is not yet approved or cleared by the [place] FDA and has been authorized for detection and/or diagnosis of SARS-CoV-2 by FDA under an Emergency Use Authorization (EUA). This EUA will remain in effect (meaning this test can be used) for the duration of the COVID-19 declaration under Section 564(b)(1) of the Act, 21 U.S.C. section 360bbb-3(b)(1), unless the authorization is terminated or revoked.  Performed at [facility], [address] [place], [place] [id number]   MRSA Next Gen by PCR, Nasal     Status: None   Collection Time: 05/26/21 11:43 PM   Specimen: Nasal Mucosa; Nasal Swab  Result Value Ref Range Status   MRSA by PCR Next Gen NOT DETECTED NOT DETECTED Final    Comment: (NOTE) The GeneXpert MRSA Assay (FDA approved for NASAL specimens only), is one component of a comprehensive MRSA colonization surveillance program. It is not intended to diagnose MRSA infection nor to guide or  monitor treatment for MRSA infections. Test performance is not FDA approved in patients less than 64 years old. Performed at [facility], [address] [place], [place] [address]      Labs: BNP (last 3 results) No results for input(s): BNP in the last 8760 hours. Basic Metabolic Panel: Recent Labs  Lab 05/26/21 1903 05/27/21 0002 05/27/21 0516 05/28/21 0222 05/29/21 0447  NA 118* 123* 123* 125* 129*  K 4.0 3.7 4.1 4.3 4.5  CL 84* 89* 91* 94* 100  CO2 GLUCOSE 102* 75 79 111* 101*  BUN 7* 7* 7* 13 13  CREATININE 0.72 0.70 0.73 0.67 0.78  CALCIUM 8.9 8.7* 8.9 8.8* 9.0  MG  --   --  2.1  --   --   PHOS  --   --  3.5  --   --    Liver Function Tests: Recent Labs  Lab 05/26/21 1903  AST 39  ALT 32  ALKPHOS 48  BILITOT 0.9  PROT 6.4*  ALBUMIN 3.9   No results for input(s):  LIPASE, AMYLASE in the last 168 hours. No results for input(s): AMMONIA in the last 168 hours. CBC: Recent Labs  Lab 05/26/21 1903 05/27/21 0516  WBC 6.1 5.6  NEUTROABS 4.1  --   HGB 13.9 14.2  HCT 37.8* 40.9  MCV 92.2 96.0  PLT 280 254   Cardiac Enzymes: No results for input(s): CKTOTAL, CKMB, CKMBINDEX, TROPONINI in the last 168 hours. BNP: Invalid input(s): POCBNP CBG: Recent Labs  Lab 05/26/21 1850  GLUCAP 113*   D-Dimer No results for input(s): DDIMER in the last 72 hours. Hgb A1c No results for input(s): HGBA1C in the last 72 hours. Lipid Profile No results for input(s): CHOL, HDL, LDLCALC, TRIG, CHOLHDL, LDLDIRECT in the last 72 hours. Thyroid function studies No results for input(s): TSH, T4TOTAL, T3FREE, THYROIDAB in the last 72 hours.  Invalid input(s): FREET3 Anemia work up No results for input(s): VITAMINB12, FOLATE, FERRITIN, TIBC, IRON, RETICCTPCT in the last 72 hours. Urinalysis    Component Value Date/Time   COLORURINE YELLOW 11/21/2020 1830   APPEARANCEUR HAZY (A) 11/21/2020 1830   LABSPEC 1.011 11/21/2020 1830   PHURINE 5.0 11/21/2020 1830   GLUCOSEU NEGATIVE 11/21/2020 1830   HGBUR NEGATIVE 11/21/2020 1830   BILIRUBINUR NEGATIVE 11/21/2020 1830   KETONESUR 5 (A) 11/21/2020 1830   PROTEINUR NEGATIVE 11/21/2020 1830   NITRITE NEGATIVE 11/21/2020 1830   LEUKOCYTESUR NEGATIVE 11/21/2020 1830   Sepsis Labs Invalid input(s): PROCALCITONIN,  WBC,  LACTICIDVEN Microbiology Recent Results (from the past 240 hour(s))  Resp Panel by RT-PCR (Flu A&B, Covid) Nasopharyngeal Swab     Status: None   Collection Time: 05/26/21  9:55 PM   Specimen: Nasopharyngeal Swab; Nasopharyngeal(NP) swabs in vial transport medium  Result Value Ref Range Status   SARS Coronavirus 2 by RT PCR NEGATIVE NEGATIVE Final    Comment: (NOTE) SARS-CoV-2 target nucleic acids are NOT DETECTED.  The SARS-CoV-2 RNA is generally detectable in upper respiratory specimens during  the acute phase of infection. The lowest concentration of SARS-CoV-2 viral copies this assay can detect is 138 copies/mL. A negative result does not preclude SARS-Cov-2 infection and should not be used as the sole basis for treatment or other patient management decisions. A negative result may occur with  improper specimen collection/handling, submission of specimen other than nasopharyngeal swab, presence of viral mutation(s) within the areas targeted by this assay, and inadequate number of viral  copies(<138 copies/mL). A negative result must be combined with clinical observations, patient history, and epidemiological information. The expected result is Negative.  Fact Sheet for Patients:  BloggerCourse.com  Fact Sheet for Healthcare Providers:  SeriousBroker.it  This test is no t yet approved or cleared by the [place] FDA and  has been authorized for detection and/or diagnosis of SARS-CoV-2 by FDA under an Emergency Use Authorization (EUA). This EUA will remain  in effect (meaning this test can be used) for the duration of the COVID-19 declaration under Section 564(b)(1) of the Act, 21 U.S.C.section 360bbb-3(b)(1), unless the authorization is terminated  or revoked sooner.       Influenza A by PCR NEGATIVE NEGATIVE Final   Influenza B by PCR NEGATIVE NEGATIVE Final    Comment: (NOTE) The Xpert Xpress SARS-CoV-2/FLU/RSV plus assay is intended as an aid in the diagnosis of influenza from Nasopharyngeal swab specimens and should not be used as a sole basis for treatment. Nasal washings and aspirates are unacceptable for Xpert Xpress SARS-CoV-2/FLU/RSV testing.  Fact Sheet for Patients: BloggerCourse.com  Fact Sheet for Healthcare Providers: SeriousBroker.it  This test is not yet approved or cleared by the [place] FDA and has been authorized for detection and/or  diagnosis of SARS-CoV-2 by FDA under an Emergency Use Authorization (EUA). This EUA will remain in effect (meaning this test can be used) for the duration of the COVID-19 declaration under Section 564(b)(1) of the Act, 21 U.S.C. section 360bbb-3(b)(1), unless the authorization is terminated or revoked.  Performed at [facility], [address] [place], [place] [address]   MRSA Next Gen by PCR, Nasal     Status: None   Collection Time: 05/26/21 11:43 PM   Specimen: Nasal Mucosa; Nasal Swab  Result Value Ref Range Status   MRSA by PCR Next Gen NOT DETECTED NOT DETECTED Final    Comment: (NOTE) The GeneXpert MRSA Assay (FDA approved for NASAL specimens only), is one component of a comprehensive MRSA colonization surveillance program. It is not intended to diagnose MRSA infection nor to guide or monitor treatment for MRSA infections. Test performance is not FDA approved in patients less than 61 years old. Performed at [facility], [address], [place], [place] [address]      Time coordinating discharge: 45 minutes  SIGNED:   Coralie Keens, MD  [facility] 05/29/2021, 10:35 AM

## 2021-05-29 NOTE — Evaluation (Signed)
Physical Therapy Evaluation Patient Details Name: Alvin Benton MRN: [id number] DOB: 08/05/1951 Today's Date: 05/29/2021   History of Present Illness  70 y.o. male presenting s/p syncopal episode at [facility] foods with EMS called. 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 PMHx significant for A-fib, HTN, heavy ETOH use, COPD, tobacco use, IBS  Clinical Impression  Pt admitted with above diagnosis. Pt currently with functional limitations due to the deficits listed below (see PT Problem List). Pt will benefit from skilled PT to increase their independence and safety with mobility to allow discharge to the venue listed below.  Pt determined to go home today.  Pt assisted with ambulating in hallway and presents with gait abnormalities (see mobility section below).  Pt requiring bil UE support so instructed to use RW upon d/c for safety and support.  Pt encouraged to have assist to get home for safety as well since he has at least 60 feet to get into apt home.  Pt only has cane in his car and RW is in his house.  Pt very eager about discharging today so TOC team member notified.     Follow Up Recommendations Home health PT;Supervision for mobility/OOB    Equipment Recommendations  Other (comment) (may need RW for safe transport to home but does have RW in home)    Recommendations for Other Services       Precautions / Restrictions Precautions Precautions: Fall Precaution Comments: CIWA protocol Restrictions Weight Bearing Restrictions: No      Mobility  Bed Mobility Overal bed mobility: Needs Assistance Bed Mobility: Supine to Sit;Sit to Supine     Supine to sit: Supervision;HOB elevated Sit to supine: Supervision;HOB elevated        Transfers Overall transfer level: Needs assistance Equipment used: Rolling walker (2 wheeled) Transfers: Sit to/from Stand Sit to Stand: Min guard         General transfer comment: min/guard for safety,  increased effort from pt, wide BOS  Ambulation/Gait Ambulation/Gait assistance: Min guard Gait Distance (Feet): 160 Feet Assistive device: Rolling walker (2 wheeled) Gait Pattern/deviations: Decreased stride length;Wide base of support;Trunk flexed;Step-through pattern;Shuffle     General Gait Details: short shuffling steps with wide BOS, max cues for remaining within or closer to RW (tends to keep RW too far forward), cues for "big steps"; multiple standing rest breaks due to fatigue; appears unsteady however no overt LOB observed  Stairs            Wheelchair Mobility    Modified Rankin (Stroke Patients Only)       Balance Overall balance assessment: Needs assistance         Standing balance support: Bilateral upper extremity supported Standing balance-Leahy Scale: Poor Standing balance comment: reliant on bil UE support                             Pertinent Vitals/Pain Pain Assessment: No/denies pain    Home Living Family/patient expects to be discharged to:: Private residence Living Arrangements: Alone Available Help at Discharge:  (None) Type of Home: Apartment Home Access: Level entry (Pt recently moved to ground floor apartment.)     Home Layout: One level Home Equipment: Walker - 2 wheels;Tub bench;Grab bars - tub/shower;Hand held shower head;Cane - single point Additional Comments: Pt reports there is a long walk from parking ot his apartment and pt must stop and sit on apartment steps to rest.    Prior  Function Level of Independence: Needs assistance   Gait / Transfers Assistance Needed: Pt denies falls for past 12 month. Pt reports use of 2WW inside apartment and use of SPC outside. Pt reports ongoing diffiuclty with prolonged standing due to back pain.  ADL's / Homemaking Assistance Needed: Mod I at baseline, but reports that bathing and dressing are a struggle. Recently reports inability to do laundry due to clogs in line that pt has not  been able to address. Patient also reports limited ability to shop for necessary items and is looking for part time help with groceries and other errands.  Comments: Reports no falls.     Hand Dominance   Dominant Hand: Right (Ambidextrous)    Extremity/Trunk Assessment        Lower Extremity Assessment Lower Extremity Assessment: Generalized weakness    Cervical / Trunk Assessment Cervical / Trunk Assessment: Kyphotic  Communication   Communication: No difficulties  Cognition Arousal/Alertness: Awake/alert Behavior During Therapy: WFL for tasks assessed/performed Overall Cognitive Status: No family/caregiver present to determine baseline cognitive functioning                                 General Comments: pt set on discharging today, poor safety awareness      General Comments      Exercises     Assessment/Plan    PT Assessment Patient needs continued PT services  PT Problem List Decreased strength;Decreased balance;Decreased mobility;Decreased knowledge of precautions;Decreased knowledge of use of DME;Decreased activity tolerance       PT Treatment Interventions Gait training;Therapeutic exercise;DME instruction;Balance training;Functional mobility training;Therapeutic activities;Patient/family education    PT Goals (Current goals can be found in the Care Plan section)  Acute Rehab PT Goals PT Goal Formulation: With patient Time For Goal Achievement: 06/12/21 Potential to Achieve Goals: Good    Frequency Min 3X/week   Barriers to discharge        Co-evaluation               AM-PAC PT "6 Clicks" Mobility  Outcome Measure Help needed turning from your back to your side while in a flat bed without using bedrails?: None Help needed moving from lying on your back to sitting on the side of a flat bed without using bedrails?: None Help needed moving to and from a bed to a chair (including a wheelchair)?: A Little Help needed standing  up from a chair using your arms (e.g., wheelchair or bedside chair)?: A Little Help needed to walk in hospital room?: A Little Help needed climbing 3-5 steps with a railing? : A Lot 6 Click Score: 19    End of Session Equipment Utilized During Treatment: Gait belt Activity Tolerance: Patient tolerated treatment well Patient left: in bed;with call bell/phone within reach;with bed alarm set   PT Visit Diagnosis: Other abnormalities of gait and mobility (R26.89)    Time: 4034-7425 PT Time Calculation (min) (ACUTE ONLY): 16 min   Charges:   PT Evaluation $PT Eval Low Complexity: 1 Low        Kati PT, DPT Acute Rehabilitation Services Pager: [phone] Office: [phone]   Maida Sale E 05/29/2021, 12:27 PM

## 2022-02-10 ENCOUNTER — Emergency Department (HOSPITAL_COMMUNITY): Payer: Medicare Other

## 2022-02-10 ENCOUNTER — Other Ambulatory Visit: Payer: Self-pay

## 2022-02-10 ENCOUNTER — Emergency Department (HOSPITAL_COMMUNITY)
Admission: EM | Admit: 2022-02-10 | Discharge: 2022-02-12 | Disposition: A | Discharge status: Rehabilitation Facility | Payer: Medicare Other | Attending: Emergency Medicine | Admitting: Emergency Medicine

## 2022-02-10 DIAGNOSIS — M545 Low back pain, unspecified: Secondary | ICD-10-CM | POA: Insufficient documentation

## 2022-02-10 DIAGNOSIS — G8929 Other chronic pain: Secondary | ICD-10-CM | POA: Diagnosis not present

## 2022-02-10 DIAGNOSIS — Z20822 Contact with and (suspected) exposure to covid-19: Secondary | ICD-10-CM | POA: Diagnosis not present

## 2022-02-10 DIAGNOSIS — J449 Chronic obstructive pulmonary disease, unspecified: Secondary | ICD-10-CM | POA: Diagnosis not present

## 2022-02-10 DIAGNOSIS — E871 Hypo-osmolality and hyponatremia: Secondary | ICD-10-CM | POA: Diagnosis not present

## 2022-02-10 DIAGNOSIS — F1010 Alcohol abuse, uncomplicated: Secondary | ICD-10-CM

## 2022-02-10 DIAGNOSIS — R030 Elevated blood-pressure reading, without diagnosis of hypertension: Secondary | ICD-10-CM

## 2022-02-10 DIAGNOSIS — R410 Disorientation, unspecified: Secondary | ICD-10-CM | POA: Insufficient documentation

## 2022-02-10 DIAGNOSIS — I10 Essential (primary) hypertension: Secondary | ICD-10-CM | POA: Insufficient documentation

## 2022-02-10 LAB — RESP PANEL BY RT-PCR (FLU A&B, COVID) ARPGX2
Influenza A by PCR: NEGATIVE
Influenza B by PCR: NEGATIVE
SARS Coronavirus 2 by RT PCR: NEGATIVE

## 2022-02-10 LAB — CBC
HCT: 40.2 % (ref 39.0–52.0)
Hemoglobin: 14 g/dL (ref 13.0–17.0)
MCH: 32.9 pg (ref 26.0–34.0)
MCHC: 34.8 g/dL (ref 30.0–36.0)
MCV: 94.6 fL (ref 80.0–100.0)
Platelets: 281 10*3/uL (ref 150–400)
RBC: 4.25 MIL/uL (ref 4.22–5.81)
RDW: 15.9 % — ABNORMAL HIGH (ref 11.5–15.5)
WBC: 7 10*3/uL (ref 4.0–10.5)
nRBC: 0 % (ref 0.0–0.2)

## 2022-02-10 LAB — URINALYSIS, ROUTINE W REFLEX MICROSCOPIC
Bilirubin Urine: NEGATIVE
Glucose, UA: NEGATIVE mg/dL
Hgb urine dipstick: NEGATIVE
Ketones, ur: NEGATIVE mg/dL
Leukocytes,Ua: NEGATIVE
Nitrite: NEGATIVE
Protein, ur: NEGATIVE mg/dL
Specific Gravity, Urine: 1.008 (ref 1.005–1.030)
pH: 7 (ref 5.0–8.0)

## 2022-02-10 LAB — COMPREHENSIVE METABOLIC PANEL
ALT: 12 U/L (ref 0–44)
AST: 27 U/L (ref 15–41)
Albumin: 3.1 g/dL — ABNORMAL LOW (ref 3.5–5.0)
Alkaline Phosphatase: 59 U/L (ref 38–126)
Anion gap: 6 (ref 5–15)
BUN: 8 mg/dL (ref 8–23)
CO2: 25 mmol/L (ref 22–32)
Calcium: 7.9 mg/dL — ABNORMAL LOW (ref 8.9–10.3)
Chloride: 102 mmol/L (ref 98–111)
Creatinine, Ser: 0.71 mg/dL (ref 0.61–1.24)
GFR, Estimated: >60 mL/min (ref >60)
Glucose, Bld: 91 mg/dL (ref 70–99)
Potassium: 3.7 mmol/L (ref 3.5–5.1)
Sodium: 133 mmol/L — ABNORMAL LOW (ref 135–145)
Total Bilirubin: 0.8 mg/dL (ref 0.3–1.2)
Total Protein: 5.7 g/dL — ABNORMAL LOW (ref 6.5–8.1)

## 2022-02-10 LAB — CBG MONITORING, ED: Glucose-Capillary: 98 mg/dL (ref 70–99)

## 2022-02-10 LAB — ETHANOL: Alcohol, Ethyl (B): <10 mg/dL (ref <10)

## 2022-02-10 LAB — LACTIC ACID, PLASMA: Lactic Acid, Venous: 1 mmol/L (ref 0.5–1.9)

## 2022-02-10 LAB — LIPASE, BLOOD: Lipase: 30 U/L (ref 11–51)

## 2022-02-10 MED ORDER — ACETAMINOPHEN 325 MG PO TABS
650.0000 mg | ORAL_TABLET | Freq: Four times a day (QID) | ORAL | Status: DC | PRN
  Administered 2022-02-11: 650 mg via ORAL
  Filled 2022-02-10: qty 2

## 2022-02-10 MED ORDER — METOPROLOL TARTRATE 25 MG PO TABS
25.0000 mg | ORAL_TABLET | Freq: Once | ORAL | Status: AC
  Administered 2022-02-10: 25 mg via ORAL
  Filled 2022-02-10: qty 1

## 2022-02-10 MED ORDER — MORPHINE SULFATE (PF) 4 MG/ML IV SOLN
4.0000 mg | Freq: Once | INTRAVENOUS | Status: AC
  Administered 2022-02-10: 4 mg via INTRAVENOUS
  Filled 2022-02-10: qty 1

## 2022-02-10 MED ORDER — ONDANSETRON HCL 4 MG/2ML IJ SOLN
4.0000 mg | Freq: Once | INTRAMUSCULAR | Status: AC
  Administered 2022-02-10: 4 mg via INTRAVENOUS
  Filled 2022-02-10: qty 2

## 2022-02-10 MED ORDER — ALBUTEROL SULFATE HFA 108 (90 BASE) MCG/ACT IN AERS: 1.0000 | INHALATION_SPRAY | Freq: Four times a day (QID) | RESPIRATORY_TRACT | Status: DC | PRN

## 2022-02-10 MED ORDER — OXYCODONE-ACETAMINOPHEN 5-325 MG PO TABS
1.0000 | ORAL_TABLET | Freq: Four times a day (QID) | ORAL | Status: DC | PRN
  Administered 2022-02-11 (×3): 1 via ORAL
  Administered 2022-02-12: 2 via ORAL
  Filled 2022-02-10 (×2): qty 1
  Filled 2022-02-10: qty 2
  Filled 2022-02-10: qty 1

## 2022-02-10 MED ORDER — METOPROLOL TARTRATE 25 MG PO TABS
25.0000 mg | ORAL_TABLET | Freq: Every day | ORAL | Status: DC
  Administered 2022-02-10 – 2022-02-12 (×3): 25 mg via ORAL
  Filled 2022-02-10 (×3): qty 1

## 2022-02-10 MED ORDER — THIAMINE HCL 100 MG/ML IJ SOLN
100.0000 mg | Freq: Every day | INTRAMUSCULAR | Status: DC
  Administered 2022-02-10: 100 mg via INTRAVENOUS
  Filled 2022-02-10: qty 2

## 2022-02-10 MED ORDER — IPRATROPIUM-ALBUTEROL 0.5-2.5 (3) MG/3ML IN SOLN
3.0000 mL | Freq: Once | RESPIRATORY_TRACT | Status: AC
  Administered 2022-02-10: 3 mL via RESPIRATORY_TRACT
  Filled 2022-02-10: qty 3

## 2022-02-10 MED ORDER — LORAZEPAM 1 MG PO TABS
1.0000 mg | ORAL_TABLET | Freq: Four times a day (QID) | ORAL | Status: DC | PRN
  Administered 2022-02-10 – 2022-02-12 (×4): 1 mg via ORAL
  Filled 2022-02-10 (×4): qty 1

## 2022-02-10 NOTE — ED Provider Notes (Addendum)
?[facility] DEPT ?Provider Note ? ? ?CSN: [id number] ?Arrival date & time: 02/10/22  [id number] ? ?  ? ?History ? ?Chief Complaint  ?Patient presents with  ? Back Pain  ? Nausea  ? ? ?Alvin Benton is a 71 y.o. male with past medical history significant for paroxysmal A-fib, hypertension, alcohol abuse, chronic lumbar low back pain with known L2 compression fracture back in 2021, history of COPD, tobacco abuse who presents with several complaints today.  Patient reports that his chronic back pain is exacerbated at this time, he rates his lumbar back pain 10/10.  He endorses some tingling in bilateral lower extremities at the periphery, denies any saddle anesthesia, numbness or tingling of the entire leg.  He also endorses some nausea without vomiting, without abdominal pain, fever, chills.  He reports that he has had some shortness of breath, has been trying to quit smoking but still smokes around 5 cigarettes a day.  EMS reports that he has had some new confusion, he was found trying to enter a vehicle that was not his.  On my evaluation patient is able to tell me his name, that we are located in [place] but does not know the year, or what hospital we are at. ? ? ?Back Pain ? ?  ? ?Home Medications ?Prior to Admission medications   ?Medication Sig Start Date End Date Taking? Authorizing Provider  ?acetaminophen (TYLENOL) 325 MG tablet Take 2 tablets (650 mg total) by mouth every 6 (six) hours as needed for mild pain (or Fever >/= 101). 05/29/21  Yes Arrien, Jimmy Picket, MD  ?Aspirin-Acetaminophen-Caffeine (EXCEDRIN PO) Take 2 tablets by mouth every morning.   Yes [provider]  ?albuterol (VENTOLIN HFA) 108 (90 Base) MCG/ACT inhaler Inhale 2 puffs into the lungs every 6 (six) hours as needed for wheezing or shortness of breath. ?Patient not taking: Reported on 02/10/2022 04/13/21   [provider]  ?   ? ?Allergies    ?Patient has no known allergies.   ? ?Review of  Systems   ?Review of Systems  ?Respiratory:  Positive for shortness of breath.   ?Gastrointestinal:  Positive for nausea.  ?Musculoskeletal:  Positive for back pain.  ?All other systems reviewed and are negative. ? ?Physical Exam ?Updated Vital Signs ?BP (!) 166/66   Pulse (!) 57   Temp 98.5 ?F (36.9 ?C) (Oral)   Resp 16   SpO2 92%  ?Physical Exam ?Vitals and nursing note reviewed.  ?Constitutional:   ?   General: He is not in acute distress. ?   Appearance: Normal appearance. He is ill-appearing.  ?HENT:  ?   Head: Normocephalic and atraumatic.  ?Eyes:  ?   General:     ?   Right eye: No discharge.     ?   Left eye: No discharge.  ?Cardiovascular:  ?   Rate and Rhythm: Normal rate and regular rhythm.  ?   Heart sounds: No murmur heard. ?  No friction rub. No gallop.  ?Pulmonary:  ?   Effort: Pulmonary effort is normal.  ?   Breath sounds: Normal breath sounds.  ?   Comments: Patient with crackles, rhonchi throughout both lung fields, wheezing, but overall good air movement, no significant respiratory distress ?Abdominal:  ?   General: Bowel sounds are normal.  ?   Palpations: Abdomen is soft.  ?Musculoskeletal:  ?   Comments: Significant tenderness to palpation midline lumbar spine, with tenderness in the surrounding musculature.  Patient does  have intact strength of bilateral lower extremities 4/5, but decreased range of motion secondary to pain.  Intact strength at the knee and ankle.  ?Skin: ?   General: Skin is warm and dry.  ?   Capillary Refill: Capillary refill takes less than 2 seconds.  ?   Comments: Patient with some significant ecchymosis scattered around the exposed skin, he also has a large growth on the back of the left hand possibly consistent with squamous cell carcinoma versus SK versus other abnormality  ?Neurological:  ?   Mental Status: He is alert and oriented to person, place, and time.  ?   Comments: Cranial nerves II through XII grossly intact.  Intact finger-nose, intact heel-to-shin.   Romberg negative, gait normal.  Alert and oriented x3.  Moves all 4 limbs spontaneously, normal coordination.  No pronator drift.  Intact strength 5 out of 5 bilateral upper extremities. 4/5 bilateral lower extremities secondary to pain / effort. ? ?  ?Psychiatric:     ?   Mood and Affect: Mood normal.     ?   Behavior: Behavior normal.  ? ? ?ED Results / Procedures / Treatments   ?Labs ?(all labs ordered are listed, but only abnormal results are displayed) ?Labs Reviewed  ?CBC - Abnormal; Notable for the following components:  ?    Result Value  ? RDW 15.9 (*)   ? All other components within normal limits  ?COMPREHENSIVE METABOLIC PANEL - Abnormal; Notable for the following components:  ? Sodium 133 (*)   ? Calcium 7.9 (*)   ? Total Protein 5.7 (*)   ? Albumin 3.1 (*)   ? All other components within normal limits  ?RESP PANEL BY RT-PCR (FLU A&B, COVID) [id number]  ?ETHANOL  ?URINALYSIS, ROUTINE W REFLEX MICROSCOPIC  ?LIPASE, BLOOD  ?LACTIC ACID, PLASMA  ?CBG MONITORING, ED  ? ? ?EKG ?EKG Interpretation ? ?Date/Time:  Sunday February 10 2022 08:08:11 EDT ?Ventricular Rate:  92 ?PR Interval:  176 ?QRS Duration: 88 ?QT Interval:  371 ?QTC Calculation: 459 ?R Axis:   14 ?Text Interpretation: Sinus rhythm Atrial premature complexes Nonspecific T abnormalities, lateral leads Confirmed by Isla Pence [phone]) on 02/10/2022 8:29:08 AM ? ?Radiology ?DG Chest 2 View ? ?Result Date: 02/10/2022 ?CLINICAL DATA:  71 year old male with shortness of breath. Back pain and cough. EXAM: CHEST - 2 VIEW COMPARISON:  CT Abdomen and Pelvis 01/01/2018. Portable chest 07/21/2017 and earlier. FINDINGS: Lung volumes and mediastinal contours remain normal. But there is widespread new pulmonary interstitial opacity with streaky bilateral upper lobe predominant pulmonary opacity greater on the right. Superimposed multilevel chronic right upper posterior rib fractures are stable. Visualized tracheal air column is within normal limits. No pneumothorax,  no pleural effusion. No acute osseous abnormality identified. Paucity of bowel gas in the upper abdomen. IMPRESSION: Stable lung volumes with new bilateral interstitial and streaky perihilar/upper lobe pulmonary opacity. Consider acute pulmonary edema versus viral/atypical respiratory infection. No pleural effusion. Electronically Signed   By: Genevie Ann M.D.   On: 02/10/2022 08:44  ? ?CT Head Wo Contrast ? ?Result Date: 02/10/2022 ?CLINICAL DATA:  Acute onset confusion and vomiting. EXAM: CT HEAD WITHOUT CONTRAST TECHNIQUE: Contiguous axial images were obtained from the base of the skull through the vertex without intravenous contrast. RADIATION DOSE REDUCTION: This exam was performed according to the departmental dose-optimization program which includes automated exposure control, adjustment of the mA and/or kV according to patient size and/or use of iterative reconstruction technique. COMPARISON:  02/20/2017 FINDINGS: Brain:  No evidence of intracranial hemorrhage, acute infarction, hydrocephalus, extra-axial collection, or mass lesion/mass effect. Stable moderate diffuse cerebral and cerebellar atrophy. No significant change in moderate to severe chronic small vessel disease. Vascular:  No hyperdense vessel or other acute findings. Skull: No evidence of fracture or other significant bone abnormality. Sinuses/Orbits:  No acute findings. Other: None. IMPRESSION: No acute intracranial abnormality. Stable cerebral and cerebellar atrophy, and chronic small vessel disease. Electronically Signed   By: Marlaine Hind M.D.   On: 02/10/2022 09:02  ? ?CT Lumbar Spine Wo Contrast ? ?Result Date: 02/10/2022 ?CLINICAL DATA:  71 year old male with persistent low back pain. EXAM: CT LUMBAR SPINE WITHOUT CONTRAST TECHNIQUE: Multidetector CT imaging of the lumbar spine was performed without intravenous contrast administration. Multiplanar CT image reconstructions were also generated. RADIATION DOSE REDUCTION: This exam was performed  according to the departmental dose-optimization program which includes automated exposure control, adjustment of the mA and/or kV according to patient size and/or use of iterative reconstruction technique. COMPAR

## 2022-02-10 NOTE — ED Triage Notes (Addendum)
Patient bib gems, patient was found trying to enter a vehicle that was not his, ems reports new onset confusion, patient reports back pain and nausea. Back pain 10/10, history of chronic back pain. 4mg  zofran given by ems in route. EMS reports history of HTN, patient not taking medications  as prescribed  ?

## 2022-02-10 NOTE — Progress Notes (Signed)
Orthopedic Tech Progress Note ?Patient Details:  ?Corporate treasurer ?[birth year] ?[id number] ? ?Patient ID: Alvin Benton, male   DOB: 1951-07-08, 71 y.o.   MRN: [id number] ? ?[facility] ?02/10/2022, 10:54 AM ? ?

## 2022-02-10 NOTE — Discharge Instructions (Addendum)
Please use Tylenol or ibuprofen for pain.  You may use 600 mg ibuprofen every 6 hours or 1000 mg of Tylenol every 6 hours.  You may choose to alternate between the 2.  This would be most effective.  Not to exceed 4 g of Tylenol within 24 hours.  Not to exceed 3200 mg ibuprofen 24 hours.  Please follow up with orthopedics at your earliest convenience. 

## 2022-02-10 NOTE — ED Notes (Signed)
Refused TSLO placement ?

## 2022-02-11 MED ORDER — THIAMINE HCL 100 MG PO TABS
100.0000 mg | ORAL_TABLET | Freq: Every day | ORAL | Status: DC
  Administered 2022-02-11 – 2022-02-12 (×2): 100 mg via ORAL
  Filled 2022-02-11 (×2): qty 1

## 2022-02-11 NOTE — Evaluation (Signed)
Physical Therapy Evaluation Patient Details Name: Alvin Benton MRN: [id number] DOB: 1950/11/21 Today's Date: 02/11/2022  History of Present Illness  Alvin Benton is a 71 y.o. male with past medical history significant for paroxysmal A-fib, hypertension, alcohol abuse, chronic lumbar low back pain with known L2 compression fracture  history of COPD, tobacco abuse who presents with back pain and SOB, confusion.CT spine- showed severe degenerative osteopenia, multiple chronic compression fractures of the lumbar spine, possible new subacute since last evaluation at [facility] and [facility].CT of head:Stable cerebral and cerebellar atrophy, and chronic small vessel  disease.  TLSO ordered  Clinical Impression  Patient declined wearing TLSO. Patient  mobilized to sitting upright on stretcher, long sitting and also with legs crossed. Patient  stood at Rw, wide base, took shuffling side steps along stretcher, attempted to go forward but stopped and started to get back onto stretcher  . Patient  Patient not oriented to date. Patient restless and endorses back pain frequently. Patient  will benefit from continued PT, and  can benefit from short rehab to improve functional independnece. Pt admitted with above diagnosis.  Pt currently with functional limitations due to the deficits listed below (see PT Problem List). Pt will benefit from skilled PT to increase their independence and safety with mobility to allow discharge to the venue listed below.           Recommendations for follow up therapy are one component of a multi-disciplinary discharge planning process, led by the attending physician.  Recommendations may be updated based on patient status, additional functional criteria and insurance authorization.  Follow Up Recommendations Skilled nursing-short term rehab (<3 hours/day)    Assistance Recommended at Discharge Intermittent Supervision/Assistance  Patient can return home with the following  A lot of help  with walking and/or transfers;Help with stairs or ramp for entrance;A little help with bathing/dressing/bathroom;Assistance with cooking/housework;Assist for transportation;Direct supervision/assist for medications management    Equipment Recommendations None recommended by PT  Recommendations for Other Services       Functional Status Assessment Patient has had a recent decline in their functional status and demonstrates the ability to make significant improvements in function in a reasonable and predictable amount of time.     Precautions / Restrictions Precautions Precautions: Fall Precaution Comments: patient declining brace to be placed, BP high Required Braces or Orthoses: Spinal Brace Spinal Brace: Thoracolumbosacral orthotic      Mobility  Bed Mobility Overal bed mobility: Needs Assistance Bed Mobility: Supine to Sit, Sit to Supine     Supine to sit: Min guard Sit to supine: Min assist   General bed mobility comments: patient is restless, moves to sitting quickly, endorses back pain. Assisted to place legs onto bed, patient  moving  quickly aso unable to provide back precautions for mobility    Transfers Overall transfer level: Needs assistance Equipment used: Rolling walker (2 wheels) Transfers: Sit to/from Stand Sit to Stand: mod steady assistance for safety/balanace           General transfer comment: stood from stretcher at [employer]    Ambulation/Gait Ambulation/Gait assistance: Mod assist             General Gait Details: attempted to take a step forward, stated" I can't." Patient did  scoot feet sideways x ~ 2 ft, very wide base.  Stairs            Wheelchair Mobility    Modified Rankin (Stroke Patients Only)       Balance Overall balance  assessment: Needs assistance, History of Falls Sitting-balance support: Feet supported, Bilateral upper extremity supported Sitting balance-Leahy Scale: Fair     Standing balance support: During  functional activity, Bilateral upper extremity supported, Reliant on assistive device for balance Standing balance-Leahy Scale: Poor Standing balance comment: heavy reliance on RW                             Pertinent Vitals/Pain Pain Assessment Pain Assessment: Faces Faces Pain Scale: Hurts whole lot Pain Location: back,  more to the right, level not specified Pain Descriptors / Indicators: Discomfort, Grimacing, Moaning, Crushing Pain Intervention(s): Limited activity within patient's tolerance, Monitored during session, Patient requesting pain meds-RN notified    Home Living Family/patient expects to be discharged to:: Private residence Living Arrangements: Alone Available Help at Discharge:  (none) Type of Home: Apartment         Home Layout: One level Home Equipment: Agricultural consultant (2 wheels);Tub bench;Cane - single point      Prior Function Prior Level of Function : Needs assist  Cognitive Assist : Mobility (cognitive)     Physical Assist : Mobility (physical)     Mobility Comments: states he barely makes the distances in apartment ADLs Comments: states he has not eaten  nor had a shower, patient disshelveled appearance.     Hand Dominance        Extremity/Trunk Assessment   Upper Extremity Assessment Upper Extremity Assessment: Overall WFL for tasks assessed    Lower Extremity Assessment Lower Extremity Assessment: LLE deficits/detail;Generalized weakness;RLE deficits/detail RLE Deficits / Details: grossly WFL LLE Deficits / Details: decreased  LT left lower leg and foot, AROM wfl, able to sit on stretcher with legs crossed    Cervical / Trunk Assessment Cervical / Trunk Assessment: Normal  Communication   Communication: No difficulties  Cognition Arousal/Alertness: Awake/alert Behavior During Therapy: Restless, Anxious Overall Cognitive Status: No family/caregiver present to determine baseline cognitive functioning Area of Impairment:  Orientation                 Orientation Level: Time             General Comments: not oriented to date,  Stated" I want to go to that place on the [place] [place] to stay, I need help."        General Comments      Exercises     Assessment/Plan    PT Assessment Patient needs continued PT services  PT Problem List Decreased strength;Decreased mobility;Decreased knowledge of precautions;Cardiopulmonary status limiting activity;Decreased cognition;Decreased activity tolerance;Decreased balance;Impaired sensation;Pain;Decreased knowledge of use of DME       PT Treatment Interventions DME instruction;Therapeutic activities;Cognitive remediation;Gait training;Therapeutic exercise;Patient/family education;Functional mobility training;Balance training    PT Goals (Current goals can be found in the Care Plan section)  Acute Rehab PT Goals Patient Stated Goal: i need to go somewhere to get help PT Goal Formulation: With patient Time For Goal Achievement: 02/25/22 Potential to Achieve Goals: Fair    Frequency Min 2X/week     Co-evaluation               AM-PAC PT "6 Clicks" Mobility  Outcome Measure Help needed turning from your back to your side while in a flat bed without using bedrails?: A Little Help needed moving from lying on your back to sitting on the side of a flat bed without using bedrails?: A Little Help needed moving to and from a bed to a  chair (including a wheelchair)?: A Lot Help needed standing up from a chair using your arms (e.g., wheelchair or bedside chair)?: A Lot Help needed to walk in hospital room?: Total Help needed climbing 3-5 steps with a railing? : Total 6 Click Score: 12    End of Session Equipment Utilized During Treatment: Gait belt Activity Tolerance: Patient limited by pain Patient left: in bed;with call bell/phone within reach Nurse Communication: Mobility status PT Visit Diagnosis: Unsteadiness on feet (R26.81);History of falling  (Z91.81);Other symptoms and signs involving the nervous system (R29.898);Difficulty in walking, not elsewhere classified (R26.2)    Time: 6644-0347 PT Time Calculation (min) (ACUTE ONLY): 17 min   Charges:   PT Evaluation $PT Eval Low Complexity: 1 Low          Blanchard Kelch PT Acute Rehabilitation Services Pager [phone] Office [phone]   Rada Hay 02/11/2022, 9:08 AM

## 2022-02-11 NOTE — [place] FL2 (Signed)
?[place] MEDICAID FL2 LEVEL OF CARE SCREENING TOOL  ?  ? ?IDENTIFICATION  ?Patient Name: ?Alvin Benton Birthdate: [birth year] Sex: male Admission Date (Current Location): ?02/10/2022  ?[place] Number: ? [place] ?  Facility and Address:  ?[facility],  [address], [place] [address] ?     Provider Number: ?[id number]  ?Attending Physician Name and Address:  ?Default, Provider, MD ? Relative Name and Phone Number:  ?  ?   ?Current Level of Care: ?Hospital Recommended Level of Care: ?Skilled Nursing Facility Prior Approval Number: ?  ? ?Date Approved/Denied: ?  PASRR Number: ?[id number] ? ?Discharge Plan: ?SNF ?  ? ?Current Diagnoses: ?Patient Active Problem List  ? Diagnosis Date Noted  ? Hyponatremia 05/26/2021  ? COPD (chronic obstructive pulmonary disease) (HCC) 05/26/2021  ? Chronic back pain greater than 3 months duration 11/21/2020  ? Elevated troponin   ? Nausea & vomiting 07/21/2017  ? Alcoholic ketoacidosis 07/21/2017  ? Dehydration 05/20/2017  ? Near syncope 02/26/2017  ? History of atrial fibrillation 02/26/2017  ? Right flank hematoma 02/26/2017  ? Zygomatic fracture, right side, initial encounter for closed fracture (HCC) 02/26/2017  ? Dizziness   ? Fall   ? Dehydration with hyponatremia 02/20/2017  ? Volume depletion 06/04/2016  ? Diverticulitis of intestine with perforation without bleeding 06/04/2016  ? Essential hypertension   ? Hypomagnesemia   ? Systolic murmur 05/05/2016  ? Anxiety state 04/19/2016  ? 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 Atrial fibrillation (HCC) 04/05/2016  ? Diverticulitis of large intestine with perforation and abscess 03/14/2016  ? Chronic constipation 03/08/2016  ? Alcohol abuse 02/15/2016  ? Tobacco use disorder 07/17/2015  ? Allergic rhinitis 07/17/2015  ? Reactive airway disease with wheezing 07/17/2015  ? Overweight (BMI 25.0-29.9) 07/17/2015  ? Hand pain, left 01/12/2014  ? LLQ pain-chronic, intermittent and episodic 09/17/2013   ? ? ?Orientation RESPIRATION BLADDER Height & Weight   ?  ?Time, Self, Place, Situation ? Normal Continent Weight:   ?Height:     ?BEHAVIORAL SYMPTOMS/MOOD NEUROLOGICAL BOWEL NUTRITION STATUS  ?    Continent Diet (PT, stated no special diet. Regular)  ?AMBULATORY STATUS COMMUNICATION OF NEEDS Skin   ?Limited Assist Verbally Normal (Scrape on hand) ?  ?  ?  ?    ?     ?     ? ? ?Personal Care Assistance Level of Assistance  ?Bathing, Dressing, Feeding Bathing Assistance: Limited assistance ?Feeding assistance: Independent ?Dressing Assistance: Limited assistance ?   ? ?Functional Limitations Info  ?Sight, Hearing, Speech Sight Info: Impaired (Just Reading Glasses) ?Hearing Info: Adequate ?Speech Info: Adequate  ? ? ?SPECIAL CARE FACTORS FREQUENCY  ?    ?  ?  ?  ?  ?  ?  ?   ? ? ?Contractures Contractures Info: Not present  ? ? ?Additional Factors Info  ?Code Status, Allergies Code Status Info: Full ?Allergies Info: No known allergies ?  ?  ?  ?   ? ?Current Medications (02/11/2022):  This is the current hospital active medication list ?Current Facility-Administered Medications  ?Medication Dose Route Frequency Provider Last Rate Last Admin  ? acetaminophen (TYLENOL) tablet 650 mg  650 mg Oral Q6H PRN Prosperi, Christian H, PA-C      ? albuterol (VENTOLIN HFA) 108 (90 Base) MCG/ACT inhaler 1-2 puff  1-2 puff Inhalation Q6H PRN Prosperi, Christian H, PA-C      ? LORazepam (ATIVAN) tablet 1 mg  1 mg Oral Q6H PRN Prosperi, Christian H, PA-C  1 mg at 02/11/22 0105  ? metoprolol tartrate (LOPRESSOR) tablet 25 mg  25 mg Oral Daily Prosperi, Christian H, PA-C   25 mg at 02/11/22 1324  ? oxyCODONE-acetaminophen (PERCOCET/ROXICET) 5-325 MG per tablet 1-2 tablet  1-2 tablet Oral Q6H PRN Prosperi, Christian H, PA-C   1 tablet at 02/11/22 0959  ? thiamine tablet 100 mg  100 mg Oral Daily Prosperi, Christian H, PA-C   100 mg at 02/11/22 4010  ? ?Current Outpatient Medications  ?Medication Sig Dispense Refill  ? acetaminophen  (TYLENOL) 325 MG tablet Take 2 tablets (650 mg total) by mouth every 6 (six) hours as needed for mild pain (or Fever >/= 101).    ? Aspirin-Acetaminophen-Caffeine (EXCEDRIN PO) Take 2 tablets by mouth every morning.    ? albuterol (VENTOLIN HFA) 108 (90 Base) MCG/ACT inhaler Inhale 2 puffs into the lungs every 6 (six) hours as needed for wheezing or shortness of breath. (Patient not taking: Reported on 02/10/2022)    ? ?Facility-Administered Medications Ordered in Other Encounters  ?Medication Dose Route Frequency Provider Last Rate Last Admin  ? lactated ringers infusion    Continuous PRN Epimenio Sarin, CRNA   Stopped at 08/29/16 2725  ? ? ? ?Discharge Medications: ?Please see discharge summary for a list of discharge medications. ? ?Relevant Imaging Results: ? ?Relevant Lab Results: ? ? ?Additional Information ?#SSN [id number] ? ?Nethan Caudillo W Shawnetta Lein, LCSW ? ? ? ? ?

## 2022-02-11 NOTE — ED Notes (Signed)
Pt noted to walk to door w/o assistance to ask NT a question.  ?

## 2022-02-11 NOTE — ED Notes (Signed)
Pt reports he was told "they are sending me back to the rehab shortly."  Pt informed this writer hasn't been given any updates. ? ?Pt sts he was previously in rehab and "was doing good with a cane."  Sts he thinks he "sat too long and is now having difficulty getting around."   ?

## 2022-02-11 NOTE — TOC Initial Note (Signed)
Transition of Care (TOC) - Initial/Assessment Note  ? ? ?Patient Details  ?Name: Alvin Benton ?MRN: [id number] ?Date of Birth: Sep 02, 1951 ? ?Transition of Care (TOC) CM/SW Contact:    ?Georgie Chard, LCSW ?Phone Number: ?02/11/2022, 9:52 AM ? ?Clinical Narrative:                 ?CSW, Called patient VIA phone, patient was willing to go to skilled nursing.CSW has started the process. ? ?Expected Discharge Plan: Skilled Nursing Facility ?Barriers to Discharge: No Barriers Identified ? ? ?Patient Goals and CMS Choice ?Patient states their goals for this hospitalization and ongoing recovery are:: Return Home after Rehab ?CMS Medicare.gov Compare Post Acute Care list provided to:: Patient ?Choice offered to / list presented to : Patient ? ?Expected Discharge Plan and Services ?Expected Discharge Plan: Skilled Nursing Facility ?  ?  ?  ?Living arrangements for the past 2 months: Single Family Home ?                ?  ?  ?  ?  ?  ?  ?  ?  ?  ?  ? ?Prior Living Arrangements/Services ?Living arrangements for the past 2 months: Single Family Home ?Lives with:: Self ?Patient language and need for interpreter reviewed:: Yes ?       ?Need for Family Participation in Patient Care: No (Comment) ?Care giver support system in place?: No (comment) ?  ?Criminal Activity/Legal Involvement Pertinent to Current Situation/Hospitalization: No - Comment as needed ? ?Activities of Daily Living ?  ?  ? ?Permission Sought/Granted ?  ?  ?   ?   ?   ?   ? ?Emotional Assessment ?Appearance:: Appears stated age ?Attitude/Demeanor/Rapport: Engaged ?Affect (typically observed): Accepting ?Orientation: : Oriented to Self, Oriented to Place, Oriented to  Time, Oriented to Situation, Fluctuating Orientation (Suspected and/or reported Sundowners) ?Alcohol / Substance Use: Tobacco Use ?Psych Involvement: No (comment) ? ?Admission diagnosis:  back pain, nausea ?Patient Active Problem List  ? Diagnosis Date Noted  ? Hyponatremia 05/26/2021  ? COPD (chronic  obstructive pulmonary disease) (HCC) 05/26/2021  ? Chronic back pain greater than 3 months duration 11/21/2020  ? Elevated troponin   ? Nausea & vomiting 07/21/2017  ? Alcoholic ketoacidosis 07/21/2017  ? Dehydration 05/20/2017  ? Near syncope 02/26/2017  ? History of atrial fibrillation 02/26/2017  ? Right flank hematoma 02/26/2017  ? Zygomatic fracture, right side, initial encounter for closed fracture (HCC) 02/26/2017  ? Dizziness   ? Fall   ? Dehydration with hyponatremia 02/20/2017  ? Volume depletion 06/04/2016  ? Diverticulitis of intestine with perforation without bleeding 06/04/2016  ? Essential hypertension   ? Hypomagnesemia   ? Systolic murmur 05/05/2016  ? Anxiety state 04/19/2016  ? 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 Atrial fibrillation (HCC) 04/05/2016  ? Diverticulitis of large intestine with perforation and abscess 03/14/2016  ? Chronic constipation 03/08/2016  ? Alcohol abuse 02/15/2016  ? Tobacco use disorder 07/17/2015  ? Allergic rhinitis 07/17/2015  ? Reactive airway disease with wheezing 07/17/2015  ? Overweight (BMI 25.0-29.9) 07/17/2015  ? Hand pain, left 01/12/2014  ? LLQ pain-chronic, intermittent and episodic 09/17/2013  ? ?PCP:  Judd Lien, PA-C ?Pharmacy:   ?[id number] - [place], [place] - [address] ?[address] ?[place] [address] ?Phone: [phone] Fax: [phone] ? ? ? ? ?Social Determinants of Health (SDOH) Interventions ?  ? ?Readmission Risk Interventions ?   ? View : No data to display.  ?  ?  ?  ? ? ? ?

## 2022-02-11 NOTE — Progress Notes (Signed)
Patient chose [facility]. [employer] Josem Kaufmann has been started. Theodoro Parma with [facility] stated to contact her when patient is ready and she will provide a room number.  ?

## 2022-02-11 NOTE — ED Provider Notes (Signed)
Emergency Medicine Observation Re-evaluation Note ? ?Alvin Benton is a 71 y.o. male, seen on rounds today.  Pt initially presented to the ED for complaints of Back Pain and Nausea ?Currently, the patient is waiting on SW placement. Is complaining of back pain. ? ?Physical Exam  ?BP (!) 198/85 (BP Location: Left Arm)   Pulse 74   Temp (!) 97.1 ?F (36.2 ?C) (Oral)   Resp 18   SpO2 (S) 90% Comment: pt will not keep on oxygen ?Physical Exam ?General: resting on stretcher on his side ?Lungs: normal effort ?Psych: no psychosis ? ?ED Course / MDM  ?EKG:EKG Interpretation ? ?Date/Time:  Sunday February 10 2022 08:08:11 EDT ?Ventricular Rate:  92 ?PR Interval:  176 ?QRS Duration: 88 ?QT Interval:  371 ?QTC Calculation: 459 ?R Axis:   14 ?Text Interpretation: Sinus rhythm Atrial premature complexes Nonspecific T abnormalities, lateral leads Confirmed by Jacalyn Lefevre [id number]) on 02/10/2022 8:29:08 AM ? ?I have reviewed the labs performed to date as well as medications administered while in observation.  Recent changes in the last 24 hours include pain medications and CIWA protocol. ? ?Plan  ?Current plan is for SW placement after PT consult. ? Alvin Benton is not under involuntary commitment. ? ? ?  ?Pricilla Loveless, MD ?02/11/22 1050 ? ?

## 2022-02-12 MED ORDER — LISINOPRIL 20 MG PO TABS: 20.0000 mg | ORAL_TABLET | Freq: Every day | ORAL | 0 refills | Status: DC

## 2022-02-12 MED ORDER — LISINOPRIL 10 MG PO TABS
20.0000 mg | ORAL_TABLET | Freq: Once | ORAL | Status: DC
Start: 2022-02-12 — End: 2022-02-12

## 2022-02-12 NOTE — ED Notes (Signed)
Upon this RN entering pts room, pt was sitting in bed with no side rails up, O2 sats at 87% RA, and seeming to be very uncomfortable. This RN repositioned pt, placed on 2L [place], given warm blankets, new vitals taken, and pt updated on POC.  ?

## 2022-02-12 NOTE — ED Notes (Signed)
Pt ambulated to room door with no assistance, states he needs help getting back in the bed. ?

## 2022-02-12 NOTE — ED Notes (Signed)
Pt ambulated to restroom with walker

## 2022-02-12 NOTE — Progress Notes (Signed)
Pt to d/c to  [facility], rm assignment 133, call report [phone]. MD and RN made aware.  ? ?Valentina Shaggy.Naelani Lafrance, MSW, LCSWA ?[facility]  Transitions of Care ?Clinical Social Worker I ?Direct Dial: [phone]  Fax: [phone] ?[email or web address]  ?

## 2022-02-12 NOTE — Progress Notes (Signed)
Pt's Auth still pending.    Bonham Zingale M.Izabel Chim, MSW, LCSWA [facility]  Transitions of Care Clinical Social Worker I Direct Dial: [phone]  Fax: [phone] [email or web address]  

## 2022-02-12 NOTE — ED Notes (Signed)
Pt went to bathroom, brushed his teeth, and drank water.  ?

## 2022-02-12 NOTE — ED Notes (Signed)
Several attempts made to call report to RN @ rm 133 @ [facility], placed on hold each time for over 15 minutes and 20 minutes respectively. ?

## 2022-02-22 ENCOUNTER — Emergency Department (HOSPITAL_COMMUNITY): Payer: Medicare Other

## 2022-02-22 ENCOUNTER — Inpatient Hospital Stay (HOSPITAL_COMMUNITY)
Admission: EM | Admit: 2022-02-22 | Discharge: 2022-02-27 | DRG: 291 | Disposition: A | Discharge status: Skilled Nursing Facility | Payer: Medicare Other | Attending: Internal Medicine | Admitting: Internal Medicine

## 2022-02-22 ENCOUNTER — Encounter (HOSPITAL_COMMUNITY): Payer: Self-pay

## 2022-02-22 ENCOUNTER — Other Ambulatory Visit: Payer: Self-pay

## 2022-02-22 ENCOUNTER — Inpatient Hospital Stay (HOSPITAL_COMMUNITY): Payer: Medicare Other

## 2022-02-22 DIAGNOSIS — Z7982 Long term (current) use of aspirin: Secondary | ICD-10-CM

## 2022-02-22 DIAGNOSIS — F419 Anxiety disorder, unspecified: Secondary | ICD-10-CM | POA: Diagnosis present

## 2022-02-22 DIAGNOSIS — I5021 Acute systolic (congestive) heart failure: Secondary | ICD-10-CM

## 2022-02-22 DIAGNOSIS — M4856XA Collapsed vertebra, not elsewhere classified, lumbar region, initial encounter for fracture: Secondary | ICD-10-CM | POA: Diagnosis present

## 2022-02-22 DIAGNOSIS — F17210 Nicotine dependence, cigarettes, uncomplicated: Secondary | ICD-10-CM | POA: Diagnosis present

## 2022-02-22 DIAGNOSIS — Z79899 Other long term (current) drug therapy: Secondary | ICD-10-CM

## 2022-02-22 DIAGNOSIS — Z87440 Personal history of urinary (tract) infections: Secondary | ICD-10-CM

## 2022-02-22 DIAGNOSIS — E222 Syndrome of inappropriate secretion of antidiuretic hormone: Secondary | ICD-10-CM | POA: Diagnosis present

## 2022-02-22 DIAGNOSIS — I5033 Acute on chronic diastolic (congestive) heart failure: Secondary | ICD-10-CM | POA: Diagnosis present

## 2022-02-22 DIAGNOSIS — I959 Hypotension, unspecified: Secondary | ICD-10-CM | POA: Diagnosis present

## 2022-02-22 DIAGNOSIS — R339 Retention of urine, unspecified: Secondary | ICD-10-CM | POA: Diagnosis present

## 2022-02-22 DIAGNOSIS — Z20822 Contact with and (suspected) exposure to covid-19: Secondary | ICD-10-CM | POA: Diagnosis present

## 2022-02-22 DIAGNOSIS — J449 Chronic obstructive pulmonary disease, unspecified: Secondary | ICD-10-CM | POA: Diagnosis present

## 2022-02-22 DIAGNOSIS — R278 Other lack of coordination: Secondary | ICD-10-CM | POA: Diagnosis present

## 2022-02-22 DIAGNOSIS — E875 Hyperkalemia: Secondary | ICD-10-CM | POA: Diagnosis present

## 2022-02-22 DIAGNOSIS — R590 Localized enlarged lymph nodes: Secondary | ICD-10-CM | POA: Diagnosis present

## 2022-02-22 DIAGNOSIS — I080 Rheumatic disorders of both mitral and aortic valves: Secondary | ICD-10-CM | POA: Diagnosis present

## 2022-02-22 DIAGNOSIS — K8020 Calculus of gallbladder without cholecystitis without obstruction: Secondary | ICD-10-CM | POA: Diagnosis present

## 2022-02-22 DIAGNOSIS — N400 Enlarged prostate without lower urinary tract symptoms: Secondary | ICD-10-CM | POA: Diagnosis present

## 2022-02-22 DIAGNOSIS — M879 Osteonecrosis, unspecified: Secondary | ICD-10-CM | POA: Diagnosis present

## 2022-02-22 DIAGNOSIS — M8580 Other specified disorders of bone density and structure, unspecified site: Secondary | ICD-10-CM | POA: Diagnosis present

## 2022-02-22 DIAGNOSIS — F10139 Alcohol abuse with withdrawal, unspecified: Secondary | ICD-10-CM | POA: Diagnosis present

## 2022-02-22 DIAGNOSIS — R9389 Abnormal findings on diagnostic imaging of other specified body structures: Secondary | ICD-10-CM | POA: Diagnosis not present

## 2022-02-22 DIAGNOSIS — K760 Fatty (change of) liver, not elsewhere classified: Secondary | ICD-10-CM | POA: Diagnosis present

## 2022-02-22 DIAGNOSIS — R0902 Hypoxemia: Principal | ICD-10-CM

## 2022-02-22 DIAGNOSIS — I110 Hypertensive heart disease with heart failure: Principal | ICD-10-CM | POA: Diagnosis present

## 2022-02-22 DIAGNOSIS — M545 Low back pain, unspecified: Secondary | ICD-10-CM | POA: Diagnosis present

## 2022-02-22 DIAGNOSIS — I509 Heart failure, unspecified: Secondary | ICD-10-CM

## 2022-02-22 DIAGNOSIS — J9601 Acute respiratory failure with hypoxia: Secondary | ICD-10-CM | POA: Diagnosis present

## 2022-02-22 DIAGNOSIS — Z823 Family history of stroke: Secondary | ICD-10-CM | POA: Diagnosis not present

## 2022-02-22 DIAGNOSIS — G8929 Other chronic pain: Secondary | ICD-10-CM | POA: Diagnosis present

## 2022-02-22 DIAGNOSIS — I480 Paroxysmal atrial fibrillation: Secondary | ICD-10-CM | POA: Diagnosis present

## 2022-02-22 DIAGNOSIS — M48061 Spinal stenosis, lumbar region without neurogenic claudication: Secondary | ICD-10-CM | POA: Diagnosis present

## 2022-02-22 LAB — BASIC METABOLIC PANEL
Anion gap: 8 (ref 5–15)
BUN: 7 mg/dL — ABNORMAL LOW (ref 8–23)
CO2: 21 mmol/L — ABNORMAL LOW (ref 22–32)
Calcium: 8.7 mg/dL — ABNORMAL LOW (ref 8.9–10.3)
Chloride: 100 mmol/L (ref 98–111)
Creatinine, Ser: 0.77 mg/dL (ref 0.61–1.24)
GFR, Estimated: >60 mL/min (ref >60)
Glucose, Bld: 90 mg/dL (ref 70–99)
Potassium: 4.7 mmol/L (ref 3.5–5.1)
Sodium: 129 mmol/L — ABNORMAL LOW (ref 135–145)

## 2022-02-22 LAB — T4, FREE: Free T4: 1.24 ng/dL — ABNORMAL HIGH (ref 0.61–1.12)

## 2022-02-22 LAB — ECHOCARDIOGRAM COMPLETE
Height: 69 in
P 1/2 time: 353 msec
Weight: 2880 oz

## 2022-02-22 LAB — TROPONIN I (HIGH SENSITIVITY)
Troponin I (High Sensitivity): 28 ng/L — ABNORMAL HIGH (ref <18)
Troponin I (High Sensitivity): 27 ng/L — ABNORMAL HIGH (ref <18)

## 2022-02-22 LAB — CBC
HCT: 38 % — ABNORMAL LOW (ref 39.0–52.0)
Hemoglobin: 13.2 g/dL (ref 13.0–17.0)
MCH: 32.8 pg (ref 26.0–34.0)
MCHC: 34.7 g/dL (ref 30.0–36.0)
MCV: 94.3 fL (ref 80.0–100.0)
Platelets: 364 10*3/uL (ref 150–400)
RBC: 4.03 MIL/uL — ABNORMAL LOW (ref 4.22–5.81)
RDW: 14.4 % (ref 11.5–15.5)
WBC: 6.4 10*3/uL (ref 4.0–10.5)
nRBC: 0 % (ref 0.0–0.2)

## 2022-02-22 LAB — I-STAT ARTERIAL BLOOD GAS, ED
Acid-base deficit: 3 mmol/L — ABNORMAL HIGH (ref 0.0–2.0)
Bicarbonate: 20.8 mmol/L (ref 20.0–28.0)
Calcium, Ion: 1.24 mmol/L (ref 1.15–1.40)
HCT: 37 % — ABNORMAL LOW (ref 39.0–52.0)
Hemoglobin: 12.6 g/dL — ABNORMAL LOW (ref 13.0–17.0)
O2 Saturation: 98 %
Patient temperature: 99.8
Potassium: 4.7 mmol/L (ref 3.5–5.1)
Sodium: 126 mmol/L — ABNORMAL LOW (ref 135–145)
TCO2: 22 mmol/L (ref 22–32)
pCO2 arterial: 34.8 mmHg (ref 32–48)
pH, Arterial: 7.387 (ref 7.35–7.45)
pO2, Arterial: 114 mmHg — ABNORMAL HIGH (ref 83–108)

## 2022-02-22 LAB — HEPATIC FUNCTION PANEL
ALT: 14 U/L (ref 0–44)
AST: 20 U/L (ref 15–41)
Albumin: 3.3 g/dL — ABNORMAL LOW (ref 3.5–5.0)
Alkaline Phosphatase: 69 U/L (ref 38–126)
Bilirubin, Direct: 0.2 mg/dL (ref 0.0–0.2)
Indirect Bilirubin: 0.8 mg/dL (ref 0.3–0.9)
Total Bilirubin: 1 mg/dL (ref 0.3–1.2)
Total Protein: 6.6 g/dL (ref 6.5–8.1)

## 2022-02-22 LAB — TSH: TSH: 1.747 u[IU]/mL (ref 0.350–4.500)

## 2022-02-22 LAB — APTT: aPTT: 34 seconds (ref 24–36)

## 2022-02-22 LAB — HIV ANTIBODY (ROUTINE TESTING W REFLEX): HIV Screen 4th Generation wRfx: NONREACTIVE

## 2022-02-22 LAB — PROTIME-INR
INR: 1.1 (ref 0.8–1.2)
Prothrombin Time: 13.9 seconds (ref 11.4–15.2)

## 2022-02-22 LAB — LACTIC ACID, PLASMA: Lactic Acid, Venous: 1 mmol/L (ref 0.5–1.9)

## 2022-02-22 LAB — PROCALCITONIN: Procalcitonin: <0.1 ng/mL

## 2022-02-22 LAB — BRAIN NATRIURETIC PEPTIDE: B Natriuretic Peptide: 735.6 pg/mL — ABNORMAL HIGH (ref 0.0–100.0)

## 2022-02-22 MED ORDER — LISINOPRIL 20 MG PO TABS: 20.0000 mg | ORAL_TABLET | Freq: Every day | ORAL | Status: DC

## 2022-02-22 MED ORDER — SODIUM CHLORIDE 0.9 % IV SOLN: 250.0000 mL | INTRAVENOUS | Status: DC | PRN

## 2022-02-22 MED ORDER — CARVEDILOL 12.5 MG PO TABS: 12.5000 mg | ORAL_TABLET | Freq: Two times a day (BID) | ORAL | Status: DC

## 2022-02-22 MED ORDER — LISINOPRIL 10 MG PO TABS
10.0000 mg | ORAL_TABLET | Freq: Every day | ORAL | Status: DC
  Administered 2022-02-22: 10 mg via ORAL
  Filled 2022-02-22: qty 1

## 2022-02-22 MED ORDER — SODIUM CHLORIDE 0.9 % IV SOLN
1.0000 g | Freq: Once | INTRAVENOUS | Status: AC
  Administered 2022-02-22: 1 g via INTRAVENOUS
  Filled 2022-02-22: qty 10

## 2022-02-22 MED ORDER — CARVEDILOL 12.5 MG PO TABS
12.5000 mg | ORAL_TABLET | Freq: Two times a day (BID) | ORAL | Status: DC
  Administered 2022-02-22 – 2022-02-26 (×9): 12.5 mg via ORAL
  Filled 2022-02-22 (×9): qty 1

## 2022-02-22 MED ORDER — SODIUM CHLORIDE 0.9% FLUSH: 3.0000 mL | INTRAVENOUS | Status: DC | PRN

## 2022-02-22 MED ORDER — ONDANSETRON HCL 4 MG/2ML IJ SOLN
4.0000 mg | Freq: Once | INTRAMUSCULAR | Status: AC
  Administered 2022-02-22: 4 mg via INTRAVENOUS
  Filled 2022-02-22: qty 2

## 2022-02-22 MED ORDER — FUROSEMIDE 10 MG/ML IJ SOLN
40.0000 mg | Freq: Once | INTRAMUSCULAR | Status: AC
  Administered 2022-02-22: 40 mg via INTRAVENOUS
  Filled 2022-02-22: qty 4

## 2022-02-22 MED ORDER — LISINOPRIL 20 MG PO TABS
20.0000 mg | ORAL_TABLET | Freq: Once | ORAL | Status: AC
  Administered 2022-02-22: 20 mg via ORAL
  Filled 2022-02-22: qty 1

## 2022-02-22 MED ORDER — FUROSEMIDE 10 MG/ML IJ SOLN
60.0000 mg | Freq: Once | INTRAMUSCULAR | Status: AC
  Administered 2022-02-22: 60 mg via INTRAVENOUS
  Filled 2022-02-22: qty 6

## 2022-02-22 MED ORDER — ONDANSETRON HCL 4 MG/2ML IJ SOLN
4.0000 mg | Freq: Four times a day (QID) | INTRAMUSCULAR | Status: DC | PRN
  Administered 2022-02-24 – 2022-02-25 (×4): 4 mg via INTRAVENOUS
  Filled 2022-02-22 (×5): qty 2

## 2022-02-22 MED ORDER — MORPHINE SULFATE (PF) 2 MG/ML IV SOLN
2.0000 mg | Freq: Once | INTRAVENOUS | Status: AC
  Administered 2022-02-22: 2 mg via INTRAVENOUS
  Filled 2022-02-22: qty 1

## 2022-02-22 MED ORDER — IPRATROPIUM-ALBUTEROL 0.5-2.5 (3) MG/3ML IN SOLN
3.0000 mL | Freq: Once | RESPIRATORY_TRACT | Status: AC
  Administered 2022-02-22: 6 mL via RESPIRATORY_TRACT
  Filled 2022-02-22: qty 3

## 2022-02-22 MED ORDER — DICLOFENAC SODIUM 1 % EX GEL
2.0000 g | Freq: Four times a day (QID) | CUTANEOUS | Status: DC
  Administered 2022-02-22 – 2022-02-26 (×14): 2 g via TOPICAL
  Filled 2022-02-22: qty 100

## 2022-02-22 MED ORDER — SODIUM CHLORIDE 0.9% FLUSH
3.0000 mL | Freq: Two times a day (BID) | INTRAVENOUS | Status: DC
  Administered 2022-02-22 – 2022-02-26 (×8): 3 mL via INTRAVENOUS

## 2022-02-22 MED ORDER — ENOXAPARIN SODIUM 40 MG/0.4ML IJ SOSY
40.0000 mg | PREFILLED_SYRINGE | Freq: Every day | INTRAMUSCULAR | Status: DC
  Administered 2022-02-22 – 2022-02-26 (×5): 40 mg via SUBCUTANEOUS
  Filled 2022-02-22 (×5): qty 0.4

## 2022-02-22 MED ORDER — DEXTROSE 5 % IV SOLN
250.0000 mg | Freq: Once | INTRAVENOUS | Status: AC
  Administered 2022-02-22: 250 mg via INTRAVENOUS
  Filled 2022-02-22: qty 2.5

## 2022-02-22 MED ORDER — ACETAMINOPHEN 325 MG PO TABS
650.0000 mg | ORAL_TABLET | ORAL | Status: DC | PRN
  Administered 2022-02-22 – 2022-02-25 (×7): 650 mg via ORAL
  Filled 2022-02-22 (×7): qty 2

## 2022-02-22 MED ORDER — ALBUTEROL SULFATE (2.5 MG/3ML) 0.083% IN NEBU: 3.0000 mL | INHALATION_SOLUTION | Freq: Four times a day (QID) | RESPIRATORY_TRACT | Status: DC | PRN

## 2022-02-22 NOTE — ED Notes (Signed)
EMS states patient initially told him he was suicidal, when his oxygen saturations were in the 70s. Once his oxygen levels rose, he denied any SI and continues to deny this now. ?

## 2022-02-22 NOTE — ED Notes (Signed)
MD at bedside. 

## 2022-02-22 NOTE — Progress Notes (Signed)
This RN contacts resident on call for PRN meds and parameters for patient elevated BP. BP 175/84 with an irregular heart rhythm and frequent ectopy. Plan of care discussed and clarity provided. Patient BP deemed safe at this time by provider. Plan to diurese and continue to monitor. Possible beta blocker to be ordered if problem remains unresolved. Provider to be notified if sBP >200.  ?

## 2022-02-22 NOTE — Progress Notes (Signed)
Orthopedic Tech Progress Note ?Patient Details:  ?Corporate treasurer ?[birth year] ?[id number] ? ?Patient ID: Alvin Benton, male   DOB: 03-Jun-1951, 71 y.o.   MRN: [id number] ?Dropped brace off in patients room on 3E. ? ?[place] ?02/22/2022, 4:57 PM ? ?

## 2022-02-22 NOTE — H&P (Addendum)
? ?NAME:  Alvin Benton, MRN:  [id number], DOB:  April 13, 1951, LOS: 0 ?ADMISSION DATE:  02/22/2022, Primary: Diamantina Providence H, PA-C  ?CHIEF COMPLAINT: Shortness of breath ? ?Medical Service: Internal Medicine Teaching Service    ?     ?Attending Physician: Dr. Evette Doffing, Mallie Mussel, *    ?First Contact: Dr. Howie Ill Pager: [phone]  ?Second Contact: Dr. Collene Gobble Pager: [phone]  ?     ?After Hours (After 5p/  First Contact Pager: [phone]  ?weekends / holidays): Second Contact Pager: [phone]  ? ? ?History of present illness   ?Alvin Benton is a chronically ill 71 year old male with history of atrial fibrillation, aortic stenosis, and COPD who was transferred to [facility] emergency department from his skilled facility after being found hypoxic.  Patient was able to contribute to history taking however would consider it somewhat unreliable due to encephalopathy. ? ?He reports a 1 week history of progressive shortness of breath, nonproductive cough, nausea, and abdominal pain.  Shortness of breath is constant but worse with laying down.  Denies fevers or chills.  He reports nausea but has not yet vomited.  Abdominal pain is diffuse. ? ?Note, patient presented to the ED on 4/16 for back pain and some confusion.  At that time, he had said that his back pain had been worsening however today he tells me that it is stable.  Lumbar spine CT showed multiple compression fractures however patient refused to wear a TLSO brace or work with PT.  He refused to go home because he could not take care of himself anymore so he remained in the ED until transitions of care was able to find placement for him.  He was discharged to a [facility] on 4/18. ? ? ? ?Past Medical History  ?He,  has a past medical history of Atrial fibrillation ([place]) (06/2017), Blood in stool, Diverticulitis, ETOH abuse, GERD (gastroesophageal reflux disease), Heart murmur, History of urinary tract infection, Hypertension, IBS (irritable bowel syndrome), Migraine,  Nocturia, Pneumonia, Seasonal allergies, and Tobacco abuse.  ? ?Home Medications    ? ?Prior to Admission medications   ?Medication Sig Start Date End Date Taking? Authorizing Provider  ?acetaminophen (TYLENOL) 325 MG tablet Take 2 tablets (650 mg total) by mouth every 6 (six) hours as needed for mild pain (or Fever >/= 101). 05/29/21   Arrien, Jimmy Picket, MD  ?albuterol (VENTOLIN HFA) 108 (90 Base) MCG/ACT inhaler Inhale 2 puffs into the lungs every 6 (six) hours as needed for wheezing or shortness of breath. ?Patient not taking: Reported on 02/10/2022 04/13/21   [provider]  ?Aspirin-Acetaminophen-Caffeine (EXCEDRIN PO) Take 2 tablets by mouth every morning.    [provider]  ?lisinopril (ZESTRIL) 20 MG tablet Take 1 tablet (20 mg total) by mouth daily for 14 days. 02/12/22 02/26/22  [name], DO  ? ? ?Allergies   ? ?No known allergies ? ?Social History  ? reports that he has been smoking cigarettes. He has a 40.00 pack-year smoking history. He has never used smokeless tobacco. He reports current alcohol use of about 3.0 standard drinks per week. He reports that he does not use drugs.  ? ?Family History   ?His family history includes Stroke in his mother. There is no history of Colon cancer, Esophageal cancer, Rectal cancer, or Stomach cancer.  ? ?Objective   ?Blood pressure (!) 207/96, pulse 88, temperature 99.4 ?F (37.4 ?C), temperature source Rectal, resp. rate (!) 24, height 5\' 9"  (1.753 m), weight 81.6 kg, SpO2 94 %. ?   ?  General: Chronically ill-appearing male resting comfortably in bed ?HEENT: Moist mucous membranes.  No scleral icterus ?Cardiac: Heart regular rate and rhythm no lower extremity edema ?Pulm: Breathing comfortably on 4 L supplemental oxygen, crackles bilaterally ?GI: Abdomen soft, nontender, no suprapubic distention ?Skin: Diffuse ecchymoses and telangiectasias.  No petechiae ?Neuro: Alert, oriented to person and month; he says it is 2028 and he is not sure where  he is at.  Moves all extremities.  No facial droop.  Bilateral asterixis ?Significant Diagnostic Tests:  ? ? ? ?  Latest Ref Rng & Units 02/22/2022  ? 11:13 AM 02/22/2022  ? 10:59 AM 02/10/2022  ?  9:08 AM  ?CBC  ?WBC 4.0 - 10.5 K/uL 6.4    7.0    ?Hemoglobin 13.0 - 17.0 g/dL 13.2   12.6   14.0    ?Hematocrit 39.0 - 52.0 % 38.0   37.0   40.2    ?Platelets 150 - 400 K/uL 364    281    ? ? ?  Latest Ref Rng & Units 02/22/2022  ? 11:13 AM 02/22/2022  ? 10:59 AM 02/10/2022  ?  9:08 AM  ?BMP  ?Glucose 70 - 99 mg/dL 90    91    ?BUN 8 - 23 mg/dL 7    8    ?Creatinine 0.61 - 1.24 mg/dL 0.77    0.71    ?Sodium 135 - 145 mmol/L 129   126   133    ?Potassium 3.5 - 5.1 mmol/L 4.7   4.7   3.7    ?Chloride 98 - 111 mmol/L 100    102    ?CO2 22 - 32 mmol/L 21    25    ?Calcium 8.9 - 10.3 mg/dL 8.7    7.9    ? ? ?Summary  ?71 yo chronically disabled male with COPD who was transferred to the ED from his skilled facility after being found hypoxic to 77%.  Work-up in the ED revealed pulmonary congestion and elevated BNP suspicious for decompensated heart failure.  He requires hospital admission due to ongoing acute hypoxic respiratory failure and IV diuresis. ? ?Assessment & Plan:  ? ?Acute hypoxic respiratory failure suspected to be secondary to new onset heart failure ?Aortic stenosis/regurgitation ?Hypertension ?Hx of atrial fib/flutter.  Not present on admission.  CHADS2VASc 4. ?BPH ? ?Discussion ?No prior diagnosis of heart failure however, radiographic findings and elevated BNP are consistent.  Elevated troponins 28> 27 likely demand ?Last echocardiogram was in 2018 which revealed significant aortic valve disease.  Aortic stenosis was considered severe on this however he also had moderate regurgitation so degree of stenosis could have been overestimated.  The following day he had undergone a right and left heart cath which showed minimal aortic stenosis and normal coronaries. ?Would consider dilated cardiomyopathy a possibility in  setting of his history of alcohol use. ?Suspect abdominal discomfort and nausea related to gut edema ? ?Plan ?- Admit to cardiac telemetry ?- Received 40 mg of Lasix in the ED.  We will give him 1 more dose today ?- Echocardiogram ?- Will be conservative with management of his hypertension until echocardiogram and aortic valve can be reassessed ?- Based on what I can see in the chart, it sounds like anticoagulation has been deferred in the past due to significant alcohol use and bleeding history.  Encephalopathy is precluding sufficient history taking and risk benefit discussions today however would consider reassessing on a daily basis ?-Monitor output closely and obtain  bladder scan if urine outputs not matching Lasix dosing ?- Telemetry monitoring, strict I's and O's, daily weights ? ? ?Left upper lobe opacity, small right pleural effusion. Likely venous and interstitial congestion however would consider repeat imaging in 3-4w to ensure resolution.  ? ?Chronic hyponatremia.  It has been associated with chronic alcohol use.  Sodium is stable at 129 in comparison to prior admissions. Doubt it is a contributing factor to encephalopathy. ? ?Chronic alcohol use disorder. He does not appear to be in acute withdrawal.  Hepatic steatosis noted on 2019 ultrasound, no evidence for cirrhosis at that time. ?-Every 4 hours CIWA monitoring ?-Check liver enzymes and PT/PTT ? ?COPD, not in acute exacerbation ?- As needed albuterol ? ?History of abnormal thyroid function testing ?- Check TSH, free T4 ? 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 ?-Would likely benefit from neurosurgical evaluation after discharge ?Best practice:  ?CODE STATUS: Full ?DVT for prophylaxis: Lovenox ?Dispo: Admit patient to Inpatient

## 2022-02-22 NOTE — ED Triage Notes (Signed)
Patient coming from nursing facility where he is being treated for back pain form lumbar fractures a few years ago. Nurses report his oxygen saturation was 77% on room air. Increased to 96% on % liters. Patient also c/o nausea for 1 week.  ?

## 2022-02-22 NOTE — ED Notes (Signed)
Pt was bladder scanned after recently voiding and had 41 mL of urine. Pt had the urge to void directly after this scan. I re-scanned and there were 0 mL detected.  ?

## 2022-02-22 NOTE — ED Provider Notes (Signed)
?[place] ?Provider Note ? ? ?CSN: [id number] ?Arrival date & time: 02/22/22  1001 ? ?  ? ?History ? ?Chief Complaint  ?Patient presents with  ? Shortness of Breath  ? ? ?Alvin Benton is a 71 y.o. male with past medical history significant for alcohol abuse, tobacco abuse, A-fib, chronic back pain, history of lumbar compression fractures, COPD who presents from nursing home due to shortness of breath, oxygen saturation noted to be 77 on room air.  He was placed on 5 L nasal cannula by EMS and has returned to 96%. Patient with hx of hypertension, reports he has not taken blood pressure medication today. Hypertensive on arrival, systolic 0000000.  Patient reports some intermittent fever, chills over last few days, no objective fever at home.  He reports that he has been coughing up some phlegm denies specific color, purulent quality of the sputum.  He denies any chest pain, heart racing, vomiting.  He reports that he has chronic baseline nausea which is present at time of eval.  He reports some chronic back pain present on arrival.  He denies any numbness, tingling, saddle anesthesia, difficulty with urination, defecation. ? ?HPI ? ?  ? ?Home Medications ?Prior to Admission medications   ?Medication Sig Start Date End Date Taking? Authorizing Provider  ?acetaminophen (TYLENOL) 325 MG tablet Take 2 tablets (650 mg total) by mouth every 6 (six) hours as needed for mild pain (or Fever >/= 101). 05/29/21   Arrien, Jimmy Picket, MD  ?albuterol (VENTOLIN HFA) 108 (90 Base) MCG/ACT inhaler Inhale 2 puffs into the lungs every 6 (six) hours as needed for wheezing or shortness of breath. ?Patient not taking: Reported on 02/10/2022 04/13/21   [provider]  ?Aspirin-Acetaminophen-Caffeine (EXCEDRIN PO) Take 2 tablets by mouth every morning.    [provider]  ?lisinopril (ZESTRIL) 20 MG tablet Take 1 tablet (20 mg total) by mouth daily for 14 days. 02/12/22 02/26/22  [name], DO  ?   ? ?Allergies    ?Patient has no allergy information on record.   ? ?Review of Systems   ?Review of Systems  ?Constitutional:  Positive for chills.  ?Respiratory:  Positive for chest tightness and shortness of breath.   ?Musculoskeletal:  Positive for back pain.  ?All other systems reviewed and are negative. ? ?Physical Exam ?Updated Vital Signs ?BP (!) 207/96   Pulse (!) 101   Temp 99.4 ?F (37.4 ?C) (Rectal)   Resp (!) 24   Ht 5\' 9"  (1.753 m)   Wt 81.6 kg   SpO2 94%   BMI 26.58 kg/m?  ?Physical Exam ?Vitals and nursing note reviewed.  ?Constitutional:   ?   General: He is not in acute distress. ?   Appearance: Normal appearance. He is ill-appearing.  ?HENT:  ?   Head: Normocephalic and atraumatic.  ?Eyes:  ?   General:     ?   Right eye: No discharge.     ?   Left eye: No discharge.  ?Cardiovascular:  ?   Rate and Rhythm: Normal rate. Rhythm irregular.  ?   Heart sounds: No murmur heard. ?  No friction rub. No gallop.  ?   Comments: Some regular rhythm with tachycardia, irregularities seem somewhat regular.  EKG notes supraventricular bigeminy. ?Pulmonary:  ?   Effort: Pulmonary effort is normal.  ?   Breath sounds: Wheezing and rhonchi present.  ?   Comments: Patient with scattered wheezing, rhonchi throughout both lung  fields.  No obvious focal consolidation noted.  Decreased respiratory effort.  No accessory muscle use. ?Abdominal:  ?   General: Bowel sounds are normal.  ?   Palpations: Abdomen is soft.  ?Musculoskeletal:  ?   Comments: Patient moves all 4 limbs spontaneously.  He has some tenderness to palpation in the midline lumbar spine which is baseline for him.   ?Skin: ?   General: Skin is warm and dry.  ?   Capillary Refill: Capillary refill takes less than 2 seconds.  ?Neurological:  ?   Mental Status: He is alert and oriented to person, place, and time.  ?Psychiatric:     ?   Mood and Affect: Mood normal.     ?   Behavior: Behavior normal.  ? ? ?ED Results / Procedures /  Treatments   ?Labs ?(all labs ordered are listed, but only abnormal results are displayed) ?Labs Reviewed  ?CBC - Abnormal; Notable for the following components:  ?    Result Value  ? RBC 4.03 (*)   ? HCT 38.0 (*)   ? All other components within normal limits  ?BASIC METABOLIC PANEL - Abnormal; Notable for the following components:  ? Sodium 129 (*)   ? CO2 21 (*)   ? BUN 7 (*)   ? Calcium 8.7 (*)   ? All other components within normal limits  ?BRAIN NATRIURETIC PEPTIDE - Abnormal; Notable for the following components:  ? B Natriuretic Peptide 735.6 (*)   ? All other components within normal limits  ?I-STAT ARTERIAL BLOOD GAS, ED - Abnormal; Notable for the following components:  ? pO2, Arterial 114 (*)   ? Acid-base deficit 3.0 (*)   ? Sodium 126 (*)   ? HCT 37.0 (*)   ? Hemoglobin 12.6 (*)   ? All other components within normal limits  ?TROPONIN I (HIGH SENSITIVITY) - Abnormal; Notable for the following components:  ? Troponin I (High Sensitivity) 28 (*)   ? All other components within normal limits  ?LACTIC ACID, PLASMA  ?TROPONIN I (HIGH SENSITIVITY)  ? ? ?EKG ?EKG Interpretation ? ?Date/Time:  Friday February 22 2022 10:14:27 EDT ?Ventricular Rate:  100 ?PR Interval:  175 ?QRS Duration: 94 ?QT Interval:  380 ?QTC Calculation: 444 ?R Axis:   38 ?Text Interpretation: Sinus tachycardia Supraventricular bigeminy Left ventricular hypertrophy Anterior Q waves, possibly due to LVH Baseline wander in lead(s) V6 No significant change since last tracing Confirmed by Blanchie Dessert [phone]) on 02/22/2022 10:29:15 AM ? ?Radiology ?DG Chest 2 View ? ?Result Date: 02/22/2022 ?CLINICAL DATA:  Cough EXAM: CHEST - 2 VIEW COMPARISON:  Chest x-ray dated February 10, 2022 FINDINGS: Cardiac and mediastinal contours are unchanged. New patchy central predominant airspace opacities with background interstitial opacities. Trace right pleural effusion. No evidence of pneumothorax. IMPRESSION: 1. New patchy central predominant airspace  opacities with background interstitial opacities, likely due to pulmonary edema, infection is an additional consideration. 2. Trace right pleural effusion. Electronically Signed   By: Yetta Glassman M.D.   On: 02/22/2022 10:36   ? ?Procedures ?Marland KitchenCritical Care ?Performed by: Anselmo Pickler, PA-C ?Authorized by: Anselmo Pickler, PA-C  ? ?Critical care provider statement:  ?  Critical care time (minutes):  32 ?  Critical care was necessary to treat or prevent imminent or life-threatening deterioration of the following conditions:  Respiratory failure (acute hypoxic respiratory failure, O2 sat 77% initially) ?  Critical care was time spent personally by me on the following activities:  Development  of treatment plan with patient or surrogate, discussions with consultants, evaluation of patient's response to treatment, examination of patient, ordering and review of laboratory studies, ordering and review of radiographic studies, ordering and performing treatments and interventions, pulse oximetry, re-evaluation of patient's condition and review of old charts ?  Care discussed with: admitting provider    ? ? ?Medications Ordered in ED ?Medications  ?lisinopril (ZESTRIL) tablet 20 mg (has no administration in time range)  ?furosemide (LASIX) injection 40 mg (has no administration in time range)  ?morphine (PF) 2 MG/ML injection 2 mg (2 mg Intravenous Given 02/22/22 1118)  ?ondansetron [facility]) injection 4 mg (4 mg Intravenous Given 02/22/22 1118)  ?ipratropium-albuterol (DUONEB) 0.5-2.5 (3) MG/3ML nebulizer solution 3 mL (6 mLs Nebulization Given 02/22/22 1051)  ?cefTRIAXone (ROCEPHIN) 1 g in sodium chloride 0.9 % 100 mL IVPB (0 g Intravenous Stopped 02/22/22 1210)  ?azithromycin (ZITHROMAX) 250 mg in dextrose 5 % 125 mL IVPB (0 mg Intravenous Stopped 02/22/22 1316)  ? ? ?ED Course/ Medical Decision Making/ A&P ?  ?                        ?Medical Decision Making ?Amount and/or Complexity of Data Reviewed ?Labs:  ordered. ?Radiology: ordered. ? ?Risk ?Prescription drug management. ? ? ?This patient presents to the ED for concern of ataxia, shortness of breath, this involves an extensive number of treatment options, and is a complaint t

## 2022-02-22 NOTE — Progress Notes (Signed)
?  Echocardiogram ?2D Echocardiogram has been performed. ? ?Alvin Benton ?02/22/2022, 4:56 PM ?

## 2022-02-23 ENCOUNTER — Inpatient Hospital Stay (HOSPITAL_COMMUNITY): Payer: Medicare Other

## 2022-02-23 DIAGNOSIS — I5033 Acute on chronic diastolic (congestive) heart failure: Secondary | ICD-10-CM

## 2022-02-23 LAB — MRSA NEXT GEN BY PCR, NASAL: MRSA by PCR Next Gen: NOT DETECTED

## 2022-02-23 LAB — BASIC METABOLIC PANEL
Anion gap: 7 (ref 5–15)
BUN: 9 mg/dL (ref 8–23)
CO2: 24 mmol/L (ref 22–32)
Calcium: 9 mg/dL (ref 8.9–10.3)
Chloride: 99 mmol/L (ref 98–111)
Creatinine, Ser: 0.93 mg/dL (ref 0.61–1.24)
GFR, Estimated: >60 mL/min (ref >60)
Glucose, Bld: 99 mg/dL (ref 70–99)
Potassium: 4.5 mmol/L (ref 3.5–5.1)
Sodium: 130 mmol/L — ABNORMAL LOW (ref 135–145)

## 2022-02-23 MED ORDER — FUROSEMIDE 10 MG/ML IJ SOLN
60.0000 mg | Freq: Once | INTRAMUSCULAR | Status: AC
  Administered 2022-02-23: 60 mg via INTRAVENOUS
  Filled 2022-02-23: qty 6

## 2022-02-23 MED ORDER — AMLODIPINE BESYLATE 5 MG PO TABS
5.0000 mg | ORAL_TABLET | Freq: Every day | ORAL | Status: DC
  Administered 2022-02-23 – 2022-02-26 (×4): 5 mg via ORAL
  Filled 2022-02-23 (×4): qty 1

## 2022-02-23 MED ORDER — THIAMINE HCL 100 MG/ML IJ SOLN: 100.0000 mg | Freq: Every day | INTRAMUSCULAR | Status: DC

## 2022-02-23 MED ORDER — LORAZEPAM 2 MG/ML IJ SOLN
1.0000 mg | INTRAMUSCULAR | Status: DC | PRN
  Administered 2022-02-24 (×2): 2 mg via INTRAVENOUS
  Administered 2022-02-24: 1 mg via INTRAVENOUS
  Filled 2022-02-23 (×3): qty 1

## 2022-02-23 MED ORDER — LORAZEPAM 1 MG PO TABS
1.0000 mg | ORAL_TABLET | ORAL | Status: DC | PRN
  Administered 2022-02-23: 2 mg via ORAL
  Administered 2022-02-23 (×2): 1 mg via ORAL
  Administered 2022-02-24: 2 mg via ORAL
  Filled 2022-02-23: qty 2
  Filled 2022-02-23 (×2): qty 1
  Filled 2022-02-23: qty 2

## 2022-02-23 MED ORDER — FOLIC ACID 1 MG PO TABS
1.0000 mg | ORAL_TABLET | Freq: Every day | ORAL | Status: DC
  Administered 2022-02-23 – 2022-02-26 (×4): 1 mg via ORAL
  Filled 2022-02-23 (×4): qty 1

## 2022-02-23 MED ORDER — LISINOPRIL 20 MG PO TABS
20.0000 mg | ORAL_TABLET | Freq: Every day | ORAL | Status: DC
  Administered 2022-02-23 – 2022-02-26 (×4): 20 mg via ORAL
  Filled 2022-02-23 (×4): qty 1

## 2022-02-23 MED ORDER — THIAMINE HCL 100 MG PO TABS
100.0000 mg | ORAL_TABLET | Freq: Every day | ORAL | Status: DC
  Administered 2022-02-23 – 2022-02-26 (×4): 100 mg via ORAL
  Filled 2022-02-23 (×4): qty 1

## 2022-02-23 MED ORDER — IOHEXOL 350 MG/ML SOLN
100.0000 mL | Freq: Once | INTRAVENOUS | Status: AC | PRN
  Administered 2022-02-23: 100 mL via INTRAVENOUS

## 2022-02-23 MED ORDER — ADULT MULTIVITAMIN W/MINERALS CH
1.0000 | ORAL_TABLET | Freq: Every day | ORAL | Status: DC
  Administered 2022-02-23 – 2022-02-26 (×3): 1 via ORAL
  Filled 2022-02-23 (×3): qty 1

## 2022-02-23 NOTE — Progress Notes (Addendum)
HD#1 SUBJECTIVE:  Patient Summary: Alvin Benton is a 71 y.o. with a pertinent PMH of pretension, atrial fibrillation, tobacco use, alcohol use, COPD, who presented with shortness of breath and dyspnea and admitted for CHF exacerbation.   Overnight Events: No acute events overnight    Interm History: Patient seen and evaluated bedside.  He is awake and alert, but appears confused to situation, time, place.  He complains of pain in his back.  Discussed with desire to obtain CT which she is amenable to.  OBJECTIVE:  Vital Signs: Vitals:   02/23/22 0529 02/23/22 0846 02/23/22 0900 02/23/22 1022  BP: (!) 193/72 (!) 212/94 (!) 204/76 (!) 169/72  Pulse: 88 80  76  Resp:      Temp:      TempSrc:      SpO2:      Weight:      Height:       Supplemental O2:  SpO2: 92 % O2 Flow Rate (L/min): 2 L/min  Filed Weights   02/22/22 1027 02/23/22 0517  Weight: 81.6 kg 68.2 kg     Intake/Output Summary (Last 24 hours) at 02/23/2022 1327 Last data filed at 02/23/2022 1128 Gross per 24 hour  Intake 240 ml  Output 2950 ml  Net -2710 ml   Net IO Since Admission: -3,410 mL [02/23/22 1327]  Physical Exam: General: Chronically ill-appearing male resting comfortably in bed Cardiac: Heart regular rate and rhythm no lower extremity edema Pulm: Breathing comfortably on RA .crackles bilaterally GI: Abdomen soft, nontender, no suprapubic distention Skin: Diffuse ecchymoses and telangiectasias. Engorged chest wall veins.  No petechiae Neuro: Alert, oriented to person and month; he says it is 2024, not oriented to situation or location  Patient Lines/Drains/Airways Status     Active Line/Drains/Airways     Name Placement date Placement time Site Days   Peripheral IV 02/22/22 20 G Anterior;Left;Lateral Wrist 02/22/22  1116  Wrist  1            Pertinent Labs:    Latest Ref Rng & Units 02/22/2022   11:13 AM 02/22/2022   10:59 AM 02/10/2022    9:08 AM  CBC  WBC 4.0 - 10.5 K/uL 6.4     7.0    Hemoglobin 13.0 - 17.0 g/dL 09.8   11.9   14.7    Hematocrit 39.0 - 52.0 % 38.0   37.0   40.2    Platelets 150 - 400 K/uL 364    281         Latest Ref Rng & Units 02/23/2022    3:59 AM 02/22/2022    6:21 PM 02/22/2022   11:13 AM  CMP  Glucose 70 - 99 mg/dL 99    90    BUN 8 - 23 mg/dL 9    7    Creatinine 8.29 - 1.24 mg/dL 5.62    1.30    Sodium 135 - 145 mmol/L 130    129    Potassium 3.5 - 5.1 mmol/L 4.5    4.7    Chloride 98 - 111 mmol/L 99    100    CO2 22 - 32 mmol/L 24    21    Calcium 8.9 - 10.3 mg/dL 9.0    8.7    Total Protein 6.5 - 8.1 g/dL  6.6     Total Bilirubin 0.3 - 1.2 mg/dL  1.0     Alkaline Phos 38 - 126 U/L  69  AST 15 - 41 U/L  20     ALT 0 - 44 U/L  14       No results for input(s): GLUCAP in the last 72 hours.   Pertinent Imaging: ECHOCARDIOGRAM COMPLETE  Result Date: 02/22/2022    ECHOCARDIOGRAM REPORT   Patient Name:   Alvin Benton Date of Exam: 02/22/2022 Medical Rec #:  [id number]    Height:       69.0 in Accession #:    [id number]   Weight:       180.0 lb Date of Birth:  [birth year]    BSA:          1.976 m Patient Age:    38 years     BP:           207/96 mmHg Patient Gender: M            HR:           75 bpm. Exam Location:  Inpatient Procedure: 2D Echo, Cardiac Doppler and Color Doppler Indications:    CHF-Acute systolic  History:        Patient has prior history of Echocardiogram examinations. COPD;                 Risk Factors:Current Smoker and Hypertension. ETOH abuse.  Sonographer:    Ross Ludwig RDCS (AE) Referring Phys: [id number] WHITNEY PLUNKETT IMPRESSIONS  1. Left ventricular ejection fraction, by estimation, is 60 to 65%. The left ventricle has normal function. The left ventricle has no regional wall motion abnormalities. There is mild left ventricular hypertrophy. Left ventricular diastolic parameters are consistent with Grade I diastolic dysfunction (impaired relaxation).  2. Right ventricular systolic function is normal. The right ventricular  size is normal.  3. The mitral valve is normal in structure. Moderate mitral valve regurgitation. No evidence of mitral stenosis.  4. The aortic valve is tricuspid. There is mild calcification of the aortic valve. There is mild thickening of the aortic valve. Aortic valve regurgitation is mild to moderate. Aortic valve sclerosis is present, with no evidence of aortic valve stenosis.  5. The inferior vena cava is normal in size with greater than 50% respiratory variability, suggesting right atrial pressure of 3 mmHg. FINDINGS  Left Ventricle: Left ventricular ejection fraction, by estimation, is 60 to 65%. The left ventricle has normal function. The left ventricle has no regional wall motion abnormalities. The left ventricular internal cavity size was normal in size. There is  mild left ventricular hypertrophy. Left ventricular diastolic parameters are consistent with Grade I diastolic dysfunction (impaired relaxation). Right Ventricle: The right ventricular size is normal. No increase in right ventricular wall thickness. Right ventricular systolic function is normal. Left Atrium: Left atrial size was normal in size. Right Atrium: Right atrial size was normal in size. Pericardium: There is no evidence of pericardial effusion. Mitral Valve: The mitral valve is normal in structure. Moderate mitral valve regurgitation. No evidence of mitral valve stenosis. Tricuspid Valve: The tricuspid valve is normal in structure. Tricuspid valve regurgitation is not demonstrated. No evidence of tricuspid stenosis. Aortic Valve: The aortic valve is tricuspid. There is mild calcification of the aortic valve. There is mild thickening of the aortic valve. Aortic valve regurgitation is mild to moderate. Aortic regurgitation PHT measures 353 msec. Aortic valve sclerosis  is present, with no evidence of aortic valve stenosis. Pulmonic Valve: The pulmonic valve was normal in structure. Pulmonic valve regurgitation is not visualized. No  evidence of pulmonic stenosis. Aorta: The  aortic root is normal in size and structure. Venous: The inferior vena cava is normal in size with greater than 50% respiratory variability, suggesting right atrial pressure of 3 mmHg. IAS/Shunts: No atrial level shunt detected by color flow Doppler.  LEFT VENTRICLE PLAX 2D LVIDd:         4.70 cm LV PW:         1.20 cm LV IVS:        1.20 cm LVOT diam:     1.90 cm LV SV:         83 LV SV Index:   42 LVOT Area:     2.84 cm  RIGHT VENTRICLE RV Basal diam:  2.90 cm RV S prime:     13.90 cm/s TAPSE (M-mode): 1.9 cm LEFT ATRIUM             Index        RIGHT ATRIUM           Index LA diam:        4.00 cm 2.02 cm/m   RA Area:     11.50 cm LA Vol (A2C):   59.1 ml 29.92 ml/m  RA Volume:   27.90 ml  14.12 ml/m LA Vol (A4C):   52.2 ml 26.42 ml/m LA Biplane Vol: 58.6 ml 29.66 ml/m  AORTIC VALVE LVOT Vmax:   184.00 cm/s LVOT Vmean:  107.000 cm/s LVOT VTI:    0.293 m AI PHT:      353 msec  AORTA Ao Root diam: 3.20 cm Ao Asc diam:  3.30 cm  SHUNTS Systemic VTI:  0.29 m Systemic Diam: 1.90 cm Donato Schultz MD Electronically signed by Donato Schultz MD Signature Date/Time: 02/22/2022/5:02:17 PM    Final     ASSESSMENT/PLAN:  Assessment: Principal Problem:   Acute exacerbation of congestive heart failure (HCC)   Possible Congestive heart failure Pulmonary Edema -On admission noted to have elevated BNP and chest x-ray showing bilateral interstitial opacities concerning for pulmonary edema. - Echocardiogram showing LVEF 60 to 65% Patient appears near euvolemia, no lower extremity edema.  Roughly 2.2 L urine output yesterday, 1.5 recorded today thus far.  Renal function has remained stable.  He does have some crackles on respiratory exam.  We will give 1 additional dose of IV Lasix 60 mg today to assist with pulmonary congestion and monitor response.  Concern for metastatic cancer. Patient has extensive smoking history greater than 40-pack-year.  Given his history of chronic  hyponatremia, multiple spinal compression fractures, and positive Pemberton sign and engorged chest wall veins, suspicious for lung malignancy.  CT chest with angiography has been ordered to further evaluate this  Alcohol use -CIWA with Ativan.  Patient has received 1 dose of Ativan overnight.  No further evidence of withdrawal at this time.  Hyponatremia -Chronic in nature.  Sodium most recently 130 and appears stable at this time.  We will continue to monitor.  Spinal compression fractures Prior compression fractures with osteonecrosis of L4, spinal stenosis L3-L5 resulting in severe disability and limited mobility. - Patient has refused TLSO brace in the past and declining pharmaceutical treatment of pain. Physical therapy has evaluated the patient and recommending SNF.  COPD No wheeze heard on exam.  Does not appear to be in acute exacerbation at this time. albuterol  Signature: Adron Bene, MD  Internal Medicine Resident, PGY-1 [facility]  Pager: [phone] 1:27 PM, 02/23/2022   Please contact the on call pager after 5 pm and on  weekends at [phone].

## 2022-02-23 NOTE — Social Work (Signed)
CSW attempted to address consult for SNF and SU however pt was sleeping and id not awaken to name call. CSW will return in the morning. ?

## 2022-02-23 NOTE — Evaluation (Signed)
Physical Therapy Evaluation ?Patient Details ?Name: Alvin Benton ?MRN: [id number] ?DOB: 10/29/1950 ?Today's Date: 02/23/2022 ? ?History of Present Illness ? 71 yo male admitted from SNF 4/28 with hypoxia and CHF. Pt with admission at [facility] 4/16-4/18 due to back pain with T11, L2, L4 compression fx who refused TLSO and D/Cd to SNF. PMhx: Afib, IBS, HTN, COPD, AS  ?Clinical Impression ? Pt with impaired cognition and safety awareness. PT educated for TLSO wear and allowed donning with max assist eOB. Pt with decreased strength, function and mobility who lives alone and needs to continue to have assist for all mobility. Pt will benefit from acute therapy to maximize safety and function with plan for return to SNF at D/C.  ? ?BP 169/72 ?   ?   ? ?Recommendations for follow up therapy are one component of a multi-disciplinary discharge planning process, led by the attending physician.  Recommendations may be updated based on patient status, additional functional criteria and insurance authorization. ? ?Follow Up Recommendations Skilled nursing-short term rehab (<3 hours/day) ? ?  ?Assistance Recommended at Discharge Intermittent Supervision/Assistance  ?Patient can return home with the following ? A lot of help with walking and/or transfers;Help with stairs or ramp for entrance;A little help with bathing/dressing/bathroom;Assistance with cooking/housework;Assist for transportation;Direct supervision/assist for medications management ? ?  ?Equipment Recommendations Rolling walker (2 wheels);BSC/3in1  ?Recommendations for Other Services ?    ?  ?Functional Status Assessment Patient has had a recent decline in their functional status and demonstrates the ability to make significant improvements in function in a reasonable and predictable amount of time.  ? ?  ?Precautions / Restrictions Precautions ?Precautions: Fall;Back ?Precaution Booklet Issued: No ?Precaution Comments: pt verbally educated for back precautions ?Required  Braces or Orthoses: Spinal Brace ?Spinal Brace: Thoracolumbosacral orthotic;Applied in sitting position  ? ?  ? ?Mobility ? Bed Mobility ?Overal bed mobility: Needs Assistance ?Bed Mobility: Rolling, Sidelying to Sit ?Rolling: Min guard ?Sidelying to sit: Min assist ?  ?  ?  ?General bed mobility comments: cues for sequence with assist to bring legs off of bed with mod cues. ?  ? ?Transfers ?Overall transfer level: Needs assistance ?  ?Transfers: Sit to/from Stand, Bed to chair/wheelchair/BSC ?Sit to Stand: Min assist ?Stand pivot transfers: Min assist ?  ?  ?  ?  ?General transfer comment: min assist with Single UE support. Pt safely placed one hand on recliner then impulsively turned to sit in chair with sacrum narrowly landing on surface with cues to reposition and scoot back in chair ?  ? ?Ambulation/Gait ?  ?  ?  ?  ?  ?  ?  ?General Gait Details: denied ? ?Stairs ?  ?  ?  ?  ?  ? ?Wheelchair Mobility ?  ? ?Modified Rankin (Stroke Patients Only) ?  ? ?  ? ?Balance   ?Sitting-balance support: No upper extremity supported, Feet supported ?Sitting balance-Leahy Scale: Fair ?  ?  ?Standing balance support: Single extremity supported ?Standing balance-Leahy Scale: Poor ?Standing balance comment: single UE support in static standing ?  ?  ?  ?  ?  ?  ?  ?  ?  ?  ?  ?   ? ? ? ?Pertinent Vitals/Pain Pain Assessment ?Pain Assessment: 0-10 ?Pain Score: 5  ?Pain Location: back at all times ?Pain Descriptors / Indicators: Guarding, Constant ?Pain Intervention(s): Limited activity within patient's tolerance, Monitored during session, Repositioned  ? ? ?Home Living Family/patient expects to be discharged to:: Skilled nursing facility ?  Living Arrangements: Alone ?  ?Type of Home: Apartment ?  ?  ?  ?  ?Home Layout: One level ?Home Equipment: Conservation officer, nature (2 wheels);Tub bench;Cane - single point ?   ?  ?Prior Function Prior Level of Function : Needs assist ? Cognitive Assist : Mobility (cognitive) ?  ?  ?Physical Assist :  Mobility (physical) ?  ?  ?Mobility Comments: limited gait since SNF and PTA last time ?ADLs Comments: unable to bathe without assist ?  ? ? ?Hand Dominance  ?   ? ?  ?Extremity/Trunk Assessment  ? Upper Extremity Assessment ?Upper Extremity Assessment: Overall WFL for tasks assessed ?  ? ?Lower Extremity Assessment ?Lower Extremity Assessment: Overall WFL for tasks assessed ?  ? ?Cervical / Trunk Assessment ?Cervical / Trunk Assessment: Other exceptions ?Cervical / Trunk Exceptions: compression fx  ?Communication  ? Communication: No difficulties  ?Cognition Arousal/Alertness: Awake/alert ?Behavior During Therapy: Restless, Impulsive ?Overall Cognitive Status: No family/caregiver present to determine baseline cognitive functioning ?Area of Impairment: Orientation, Memory, Following commands, Safety/judgement ?  ?  ?  ?  ?  ?  ?  ?  ?Orientation Level: Disoriented to, Time ?  ?Memory: Decreased short-term memory, Decreased recall of precautions ?Following Commands: Follows one step commands inconsistently ?Safety/Judgement: Decreased awareness of safety, Decreased awareness of deficits ?  ?  ?General Comments: pt urinating in emesis bag on arrival with linens and bed soaked despite urinal beside him, impulsive with mobility ?  ?  ? ?  ?General Comments   ? ?  ?Exercises    ? ?Assessment/Plan  ?  ?PT Assessment Patient needs continued PT services  ?PT Problem List Decreased strength;Decreased mobility;Decreased knowledge of precautions;Cardiopulmonary status limiting activity;Decreased cognition;Decreased activity tolerance;Decreased balance;Impaired sensation;Pain;Decreased knowledge of use of DME ? ?   ?  ?PT Treatment Interventions DME instruction;Therapeutic activities;Cognitive remediation;Gait training;Therapeutic exercise;Patient/family education;Functional mobility training;Balance training   ? ?PT Goals (Current goals can be found in the Care Plan section)  ?Acute Rehab PT Goals ?Patient Stated Goal: be able  to walk ?PT Goal Formulation: With patient ?Time For Goal Achievement: 03/09/22 ?Potential to Achieve Goals: Fair ? ?  ?Frequency Min 2X/week ?  ? ? ?Co-evaluation   ?  ?  ?  ?  ? ? ?  ?AM-PAC PT "6 Clicks" Mobility  ?Outcome Measure Help needed turning from your back to your side while in a flat bed without using bedrails?: A Little ?Help needed moving from lying on your back to sitting on the side of a flat bed without using bedrails?: A Little ?Help needed moving to and from a bed to a chair (including a wheelchair)?: A Lot ?Help needed standing up from a chair using your arms (e.g., wheelchair or bedside chair)?: A Little ?Help needed to walk in hospital room?: Total ?Help needed climbing 3-5 steps with a railing? : Total ?6 Click Score: 13 ? ?  ?End of Session Equipment Utilized During Treatment: Back brace ?Activity Tolerance: Patient limited by pain;Patient limited by fatigue ?Patient left: in chair;with call bell/phone within reach;with chair alarm set ?Nurse Communication: Mobility status ?PT Visit Diagnosis: History of falling (Z91.81);Other symptoms and signs involving the nervous system (R29.898);Difficulty in walking, not elsewhere classified (R26.2);Other abnormalities of gait and mobility (R26.89) ?  ? ?Time: [id number] ?PT Time Calculation (min) (ACUTE ONLY): 19 min ? ? ?Charges:   PT Evaluation ?$PT Eval Moderate Complexity: 1 Mod ?  ?  ?   ? ? ?Beau Ramsburg P, PT ?Acute Rehabilitation Services ?Pager: [phone] ?  Office: [phone] ? ? ?Chiffon Kittleson B Stepfanie Yott ?02/23/2022, 11:10 AM ? ?

## 2022-02-23 NOTE — Evaluation (Signed)
Occupational Therapy Evaluation ?Patient Details ?Name: Alvin Benton ?MRN: [id number] ?DOB: September 02, 1951 ?Today's Date: 02/23/2022 ? ? ?History of Present Illness 71 yo male admitted from [facility] 4/28 with hypoxia and CHF. Pt with admission at [facility] 4/16-4/18 due to back pain with T11, L2, L4 compression fx who refused TLSO and D/Cd to SNF. PMhx: Afib, IBS, HTN, COPD, AS  ? ?Clinical Impression ?  ?PT PTA: pt unable to properly take care of self and requiring SNF placement (came from SNF). Pt was requiring assist for ADL and mobility. Pt currently, limited by decreased strength, decreased activity tolerance, decreased ability to care for self and increased back pain. Pt set-upA to totalA overall for ADL. Pt sit to stand with modA overall for power up and stability with RW. Pt tolerating TLSO in recliner. Pt's mentation appeared to be more clear after PT visit today. Pt would benefit from continued OT skilled services. OT following acutely. ?   ? ?Recommendations for follow up therapy are one component of a multi-disciplinary discharge planning process, led by the attending physician.  Recommendations may be updated based on patient status, additional functional criteria and insurance authorization.  ? ?Follow Up Recommendations ? Skilled nursing-short term rehab (<3 hours/day)  ?  ?Assistance Recommended at Discharge Frequent or constant Supervision/Assistance  ?Patient can return home with the following A lot of help with walking and/or transfers;A lot of help with bathing/dressing/bathroom ? ?  ?Functional Status Assessment ? Patient has had a recent decline in their functional status and demonstrates the ability to make significant improvements in function in a reasonable and predictable amount of time.  ?Equipment Recommendations ? BSC/3in1;Wheelchair (measurements OT)  ?  ?Recommendations for Other Services   ? ? ?  ?Precautions / Restrictions Precautions ?Precautions: Fall;Back ?Precaution Booklet Issued: No ?Precaution  Comments: pt verbally educated for back precautions ?Required Braces or Orthoses: Spinal Brace ?Spinal Brace: Thoracolumbosacral orthotic;Applied in sitting position ?Restrictions ?Weight Bearing Restrictions: No  ? ?  ? ?Mobility Bed Mobility ?  ?  ?  ?  ?  ?  ?  ?General bed mobility comments: in recliner pre and post session ?  ? ?Transfers ?Overall transfer level: Needs assistance ?  ?Transfers: Sit to/from Stand, Bed to chair/wheelchair/BSC ?Sit to Stand: Min assist ?  ?  ?  ?  ?  ?General transfer comment: only able to stand briefly and closing eyes; minA to avoid plopping down ?  ? ?  ?Balance Overall balance assessment: Needs assistance, History of Falls ?Sitting-balance support: No upper extremity supported, Feet supported ?Sitting balance-Leahy Scale: Fair ?  ?  ?Standing balance support: Single extremity supported ?Standing balance-Leahy Scale: Poor ?Standing balance comment: single UE support in static standing ?  ?  ?  ?  ?  ?  ?  ?  ?  ?  ?  ?   ? ?ADL either performed or assessed with clinical judgement  ? ?ADL Overall ADL's : Needs assistance/impaired ?Eating/Feeding: Set up;Sitting ?  ?Grooming: Set up;Sitting ?  ?Upper Body Bathing: Set up;Sitting ?  ?Lower Body Bathing: Total assistance;Sit to/from stand ?  ?Upper Body Dressing : Minimal assistance;Sitting;Standing ?  ?Lower Body Dressing: Total assistance;Sitting/lateral leans;Sit to/from stand ?  ?Toilet Transfer: Minimal assistance;Squat-pivot;Rolling walker (2 wheels) ?  ?Toileting- Clothing Manipulation and Hygiene: Maximal assistance;Sitting/lateral lean;Sit to/from stand ?  ?  ?  ?Functional mobility during ADLs: Moderate assistance;Rolling walker (2 wheels);Cueing for safety;Cueing for sequencing ?General ADL Comments: Pt limited by decreased strength, decreased activity tolerance, decreased ability  to care for self and increased back pain.  ? ? ? ?Vision Baseline Vision/History: 0 No visual deficits ?Ability to See in Adequate Light: 0  Adequate ?Patient Visual Report: No change from baseline ?Vision Assessment?: No apparent visual deficits  ?   ?Perception   ?  ?Praxis   ?  ? ?Pertinent Vitals/Pain Pain Assessment ?Pain Assessment: 0-10 ?Pain Score: 5  ?Pain Location: back at all times ?Pain Descriptors / Indicators: Discomfort ?Pain Intervention(s): Monitored during session  ? ? ? ?Hand Dominance Right ?  ?Extremity/Trunk Assessment Upper Extremity Assessment ?Upper Extremity Assessment: Overall WFL for tasks assessed ?  ?Lower Extremity Assessment ?Lower Extremity Assessment: Overall WFL for tasks assessed ?  ?Cervical / Trunk Assessment ?Cervical / Trunk Assessment: Other exceptions ?Cervical / Trunk Exceptions: compression fx ?  ?Communication Communication ?Communication: No difficulties ?  ?Cognition Arousal/Alertness: Awake/alert ?Behavior During Therapy: Restless ?Overall Cognitive Status: No family/caregiver present to determine baseline cognitive functioning ?Area of Impairment: Safety/judgement ?  ?  ?  ?  ?  ?  ?  ?  ?  ?  ?  ?  ?Safety/Judgement: Decreased awareness of safety, Decreased awareness of deficits ?  ?  ?General Comments: Pt following commands during session, but very drowsy. Able to talk on phone to make lunch and dinner order. ?  ?  ?General Comments  coughing throughout session ? ?  ?Exercises   ?  ?Shoulder Instructions    ? ? ?Home Living Family/patient expects to be discharged to:: Skilled nursing facility ?Living Arrangements: Alone ?  ?Type of Home: Apartment ?  ?  ?  ?Home Layout: One level ?  ?  ?Bathroom Shower/Tub: Tub/shower unit ?  ?  ?  ?  ?Home Equipment: Agricultural consultantolling Walker (2 wheels);Tub bench;Cane - single point ?  ?  ?  ? ?  ?Prior Functioning/Environment Prior Level of Function : Needs assist ? Cognitive Assist : Mobility (cognitive) ?  ?  ?Physical Assist : Mobility (physical) ?  ?  ?Mobility Comments: limited gait since SNF and PTA last time ?ADLs Comments: unable to bathe without assist; BUEs strong  enough for upper body ADL ?  ? ?  ?  ?OT Problem List: Decreased strength;Decreased activity tolerance;Impaired balance (sitting and/or standing);Decreased cognition;Decreased safety awareness;Impaired UE functional use;Pain;Increased edema;Cardiopulmonary status limiting activity ?  ?   ?OT Treatment/Interventions: Self-care/ADL training;Therapeutic exercise;Energy conservation;DME and/or AE instruction;Therapeutic activities;Cognitive remediation/compensation;Balance training;Patient/family education  ?  ?OT Goals(Current goals can be found in the care plan section) Acute Rehab OT Goals ?Patient Stated Goal: to go to SNF ?OT Goal Formulation: With patient ?Time For Goal Achievement: 03/09/22 ?Potential to Achieve Goals: Good ?ADL Goals ?Pt Will Perform Grooming: standing;with min guard assist ?Pt Will Perform Lower Body Dressing: with min assist;sitting/lateral leans;sit to/from stand;with adaptive equipment ?Pt Will Transfer to Toilet: with min guard assist;bedside commode;squat pivot transfer  ?OT Frequency: Min 2X/week ?  ? ?Co-evaluation   ?  ?  ?  ?  ? ?  ?AM-PAC OT "6 Clicks" Daily Activity     ?Outcome Measure Help from another person eating meals?: None ?Help from another person taking care of personal grooming?: A Little ?Help from another person toileting, which includes using toliet, bedpan, or urinal?: A Lot ?Help from another person bathing (including washing, rinsing, drying)?: A Lot ?Help from another person to put on and taking off regular upper body clothing?: A Little ?Help from another person to put on and taking off regular lower body clothing?: A Lot ?6  Click Score: 16 ?  ?End of Session Equipment Utilized During Treatment: Rolling walker (2 wheels);Back brace ?Nurse Communication: Mobility status ? ?Activity Tolerance: Patient limited by pain;Patient limited by lethargy ?Patient left: in chair;with call bell/phone within reach;with chair alarm set ? ?OT Visit Diagnosis: Unsteadiness on feet  (R26.81);Muscle weakness (generalized) (M62.81);Pain  ?              ?Time: 1100-1116 ?OT Time Calculation (min): 16 min ?Charges:  OT General Charges ?$OT Visit: 1 Visit ?OT Evaluation ?$OT Eval Moderate Co

## 2022-02-23 NOTE — Plan of Care (Signed)

## 2022-02-24 LAB — CBC WITH DIFFERENTIAL/PLATELET
Abs Immature Granulocytes: 0.04 10*3/uL (ref 0.00–0.07)
Basophils Absolute: 0.1 10*3/uL (ref 0.0–0.1)
Basophils Relative: 1 %
Eosinophils Absolute: 0.2 10*3/uL (ref 0.0–0.5)
Eosinophils Relative: 3 %
HCT: 37.5 % — ABNORMAL LOW (ref 39.0–52.0)
Hemoglobin: 12.9 g/dL — ABNORMAL LOW (ref 13.0–17.0)
Immature Granulocytes: 1 %
Lymphocytes Relative: 20 %
Lymphs Abs: 1.3 10*3/uL (ref 0.7–4.0)
MCH: 32.3 pg (ref 26.0–34.0)
MCHC: 34.4 g/dL (ref 30.0–36.0)
MCV: 93.8 fL (ref 80.0–100.0)
Monocytes Absolute: 0.8 10*3/uL (ref 0.1–1.0)
Monocytes Relative: 14 %
Neutro Abs: 3.9 10*3/uL (ref 1.7–7.7)
Neutrophils Relative %: 61 %
Platelets: 388 10*3/uL (ref 150–400)
RBC: 4 MIL/uL — ABNORMAL LOW (ref 4.22–5.81)
RDW: 14.6 % (ref 11.5–15.5)
WBC: 6.2 10*3/uL (ref 4.0–10.5)
nRBC: 0 % (ref 0.0–0.2)

## 2022-02-24 LAB — BASIC METABOLIC PANEL
Anion gap: 8 (ref 5–15)
BUN: 11 mg/dL (ref 8–23)
CO2: 24 mmol/L (ref 22–32)
Calcium: 8.9 mg/dL (ref 8.9–10.3)
Chloride: 98 mmol/L (ref 98–111)
Creatinine, Ser: 0.9 mg/dL (ref 0.61–1.24)
GFR, Estimated: >60 mL/min (ref >60)
Glucose, Bld: 97 mg/dL (ref 70–99)
Potassium: 4.3 mmol/L (ref 3.5–5.1)
Sodium: 130 mmol/L — ABNORMAL LOW (ref 135–145)

## 2022-02-24 LAB — MAGNESIUM: Magnesium: 2 mg/dL (ref 1.7–2.4)

## 2022-02-24 MED ORDER — FUROSEMIDE 10 MG/ML IJ SOLN
40.0000 mg | Freq: Every day | INTRAMUSCULAR | Status: DC
  Administered 2022-02-24 – 2022-02-26 (×3): 40 mg via INTRAVENOUS
  Filled 2022-02-24 (×3): qty 4

## 2022-02-24 NOTE — [place] FL2 (Signed)
?[place] MEDICAID FL2 LEVEL OF CARE SCREENING TOOL  ?  ? ?IDENTIFICATION  ?Patient Name: ?Alvin Benton Birthdate: 1951/08/17 Sex: male Admission Date (Current Location): ?02/22/2022  ?[place] and [place] Number: ? [place] ?  Facility and Address:  ?[facility], [place] [address] [place], [place] [address] ?     Provider Number: ?[id number]  ?Attending Physician Name and Address:  ?Velna Ochs, MD ? Relative Name and Phone Number:  ?[name] only call if needed ?   ?Current Level of Care: ?Hospital Recommended Level of Care: ?Nursing Facility Prior Approval Number: ?  ? ?Date Approved/Denied: ?  PASRR Number: ?[id number] ? ?Discharge Plan: ?SNF ?  ? ?Current Diagnoses: ?Patient Active Problem List  ? Diagnosis Date Noted  ? Acute exacerbation of congestive heart failure ([place]) 02/22/2022  ? Hyponatremia 05/26/2021  ? COPD (chronic obstructive pulmonary disease) ([place]) 05/26/2021  ? Chronic back pain greater than 3 months duration 11/21/2020  ? Elevated troponin   ? Nausea & vomiting 07/21/2017  ? Alcoholic ketoacidosis [id number]  ? Dehydration 05/20/2017  ? Near syncope 02/26/2017  ? History of atrial fibrillation 02/26/2017  ? Right flank hematoma 02/26/2017  ? Zygomatic fracture, right side, initial encounter for closed fracture ([place]) 02/26/2017  ? Dizziness   ? Fall   ? Dehydration with hyponatremia 02/20/2017  ? Volume depletion 06/04/2016  ? Diverticulitis of intestine with perforation without bleeding 06/04/2016  ? Essential hypertension   ? Hypomagnesemia   ? Systolic murmur [id number]  ? Anxiety state 04/19/2016  ? 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 Atrial fibrillation ([place]) 04/05/2016  ? Diverticulitis of large intestine with perforation and abscess 03/14/2016  ? Chronic constipation 03/08/2016  ? Alcohol abuse 02/15/2016  ? Tobacco use disorder 07/17/2015  ? Allergic rhinitis 07/17/2015  ? Reactive airway disease with wheezing 07/17/2015  ? Overweight (BMI 25.0-29.9)  07/17/2015  ? Hand pain, left 01/12/2014  ? LLQ pain-chronic, intermittent and episodic 09/17/2013  ? ? ?Orientation RESPIRATION BLADDER Height & Weight   ?  ?Self, Time, Situation, Place ? Normal Continent Weight: 154 lb 12.2 oz (70.2 kg) ?Height:  5\' 9"  (175.3 cm)  ?BEHAVIORAL SYMPTOMS/MOOD NEUROLOGICAL BOWEL NUTRITION STATUS  ?    Continent Diet (See DC summary)  ?AMBULATORY STATUS COMMUNICATION OF NEEDS Skin   ?Limited Assist Verbally Skin abrasions (arm) ?  ?  ?  ?    ?     ?     ? ? ?Personal Care Assistance Level of Assistance  ?Bathing, Feeding, Dressing Bathing Assistance: Maximum assistance ?Feeding assistance: Independent ?Dressing Assistance: Maximum assistance ?   ? ?Functional Limitations Info  ?Sight, Hearing, Speech Sight Info: Impaired ?Hearing Info: Adequate ?Speech Info: Adequate  ? ? ?SPECIAL CARE FACTORS FREQUENCY  ?PT (By licensed PT), OT (By licensed OT)   ?  ?PT Frequency: 5x per week ?OT Frequency: 5x per week ?  ?  ?  ?   ? ? ?Contractures Contractures Info: Not present  ? ? ?Additional Factors Info  ?Code Status, Allergies Code Status Info: Full ?Allergies Info: NKA ?  ?  ?  ?   ? ?Current Medications (02/24/2022):  This is the current hospital active medication list ?Current Facility-Administered Medications  ?Medication Dose Route Frequency Provider Last Rate Last Admin  ? 0.9 %  sodium chloride infusion  250 mL Intravenous PRN Christian, Rylee, MD      ? acetaminophen (TYLENOL) tablet 650 mg  650 mg Oral Q4H PRN Mitzi Hansen, MD  650 mg at 02/23/22 1747  ? albuterol (PROVENTIL) (2.5 MG/3ML) 0.083% nebulizer solution 3 mL  3 mL Inhalation Q6H PRN Christian, Rylee, MD      ? amLODipine (NORVASC) tablet 5 mg  5 mg Oral Daily Sanjuan Dame, MD   5 mg at 02/24/22 [id number]  ? carvedilol (COREG) tablet 12.5 mg  12.5 mg Oral BID WC Pham, Minh Q, RPH-CPP   12.5 mg at 02/24/22 0957  ? diclofenac Sodium (VOLTAREN) 1 % topical gel 2 g  2 g Topical QID Sanjuan Dame, MD   2 g at 02/24/22  0955  ? enoxaparin (LOVENOX) injection 40 mg  40 mg Subcutaneous Daily Darrick Meigs, Rylee, MD   40 mg at 02/24/22 0954  ? folic acid (FOLVITE) tablet 1 mg  1 mg Oral Daily Lacinda Axon, MD   1 mg at 02/24/22 [id number]  ? lisinopril (ZESTRIL) tablet 20 mg  20 mg Oral Daily Sanjuan Dame, MD   20 mg at 02/24/22 [id number]  ? LORazepam (ATIVAN) tablet 1-4 mg  1-4 mg Oral Q1H PRN Lacinda Axon, MD   1 mg at 02/23/22 1733  ? Or  ? LORazepam (ATIVAN) injection 1-4 mg  1-4 mg Intravenous Q1H PRN Lacinda Axon, MD   2 mg at 02/24/22 [id number]  ? multivitamin with minerals tablet 1 tablet  1 tablet Oral Daily Lacinda Axon, MD   1 tablet at 02/23/22 [id number]  ? ondansetron (ZOFRAN) injection 4 mg  4 mg Intravenous Q6H PRN Mitzi Hansen, MD   4 mg at 02/24/22 0954  ? sodium chloride flush (NS) 0.9 % injection 3 mL  3 mL Intravenous Q12H Christian, Rylee, MD   3 mL at 02/24/22 0959  ? sodium chloride flush (NS) 0.9 % injection 3 mL  3 mL Intravenous PRN Christian, Rylee, MD      ? thiamine tablet 100 mg  100 mg Oral Daily Lacinda Axon, MD   100 mg at 02/24/22 [id number]  ? Or  ? thiamine (B-1) injection 100 mg  100 mg Intravenous Daily Lacinda Axon, MD      ? ?Facility-Administered Medications Ordered in Other Encounters  ?Medication Dose Route Frequency Provider Last Rate Last Admin  ? lactated ringers infusion    Continuous PRN Montel Clock, CRNA   Stopped at 08/29/16 [id number]  ? ? ? ?Discharge Medications: ?Please see discharge summary for a list of discharge medications. ? ?Relevant Imaging Results: ? ?Relevant Lab Results: ? ? ?Additional Information ?SSN# [id number] ? ?Bary Castilla, LCSW ? ? ? ? ?

## 2022-02-24 NOTE — Progress Notes (Signed)
? ?Subjective:  ? ?Hospital day: 2 ? ?Overnight event: Received 1 mg Ativan for CIWA score of 9 earlier this morning. ? ?Interim History: Patient evaluated laying comfortably in bed. Per RN, patient was given 2 mg of IV Ativan for CIWA score of 15 around 0745. Patient endorses some nausea but also states that he is hungry and ready to eat some food.  Patient drowsy but alert and oriented x3.  Continues to have low back pain but denies any shortness of breath, chest pain, or abdominal pain. ? ?Objective: ? ?Vital signs in last 24 hours: ?Vitals:  ? 02/23/22 1732 02/23/22 1951 02/24/22 0046 02/24/22 0415  ?BP: (!) 148/81 (!) 165/82 127/82 140/64  ?Pulse:  76 80 71  ?Resp: 13 20 (!) 21 20  ?Temp:  98.3 ?F (36.8 ?C) 98.3 ?F (36.8 ?C) 98.2 ?F (36.8 ?C)  ?TempSrc:  Oral Oral Oral  ?SpO2:  94% 93% 92%  ?Weight:    70.2 kg  ?Height:      ? ? ?Filed Weights  ? 02/22/22 1027 02/23/22 0517 02/24/22 0415  ?Weight: 81.6 kg 68.2 kg 70.2 kg  ? ? ? ?Intake/Output Summary (Last 24 hours) at 02/24/2022 0713 ?Last data filed at 02/24/2022 0500 ?Gross per 24 hour  ?Intake 780 ml  ?Output 2250 ml  ?Net -1470 ml  ? ?Net IO Since Admission: -3,670 mL [02/24/22 0713] ? ?No results for input(s): GLUCAP in the last 72 hours.  ? ?Pertinent Labs: ? ?  Latest Ref Rng & Units 02/24/2022  ?  2:31 AM 02/22/2022  ? 11:13 AM 02/22/2022  ? 10:59 AM  ?CBC  ?WBC 4.0 - 10.5 K/uL 6.2   6.4     ?Hemoglobin 13.0 - 17.0 g/dL 16.112.9   09.613.2   04.512.6    ?Hematocrit 39.0 - 52.0 % 37.5   38.0   37.0    ?Platelets 150 - 400 K/uL 388   364     ? ? ? ?  Latest Ref Rng & Units 02/24/2022  ?  2:31 AM 02/23/2022  ?  3:59 AM 02/22/2022  ?  6:21 PM  ?CMP  ?Glucose 70 - 99 mg/dL 97   99     ?BUN 8 - 23 mg/dL 11   9     ?Creatinine 0.61 - 1.24 mg/dL 4.090.90   8.110.93     ?Sodium 135 - 145 mmol/L 130   130     ?Potassium 3.5 - 5.1 mmol/L 4.3   4.5     ?Chloride 98 - 111 mmol/L 98   99     ?CO2 22 - 32 mmol/L 24   24     ?Calcium 8.9 - 10.3 mg/dL 8.9   9.0     ?Total Protein 6.5 - 8.1 g/dL    6.6    ?Total Bilirubin 0.3 - 1.2 mg/dL   1.0    ?Alkaline Phos 38 - 126 U/L   69    ?AST 15 - 41 U/L   20    ?ALT 0 - 44 U/L   14    ? ? ?Imaging: ?CT ANGIO CHEST AORTA W/CM & OR WO/CM ? ?Result Date: 02/23/2022 ?CLINICAL DATA:  Thoracic outlet syndrome.  Rule out SVC syndrome. EXAM: CT ANGIOGRAPHY CHEST WITH CONTRAST TECHNIQUE: Multidetector CT imaging of the chest was performed using the standard protocol during bolus administration of intravenous contrast. Multiplanar CT image reconstructions and MIPs were obtained to evaluate the vascular anatomy. RADIATION DOSE REDUCTION: This exam was performed  according to the departmental dose-optimization program which includes automated exposure control, adjustment of the mA and/or kV according to patient size and/or use of iterative reconstruction technique. CONTRAST:  OMNIPAQUE IOHEXOL 350 MG/ML SOLN COMPARISON:  02/26/2017 FINDINGS: Cardiovascular: The heart size is normal. No substantial pericardial effusion. No thoracic aortic aneurysm. Enlargement of the pulmonary outflow tract/main pulmonary arteries suggests pulmonary arterial hypertension. Patient was injected in the left upper extremity with prominent collateralization in the superficial anterior left chest wall. There is some apparent venous narrowing in the region of the left subclavian vein although left innominate vein is widely patent. SVC is patent. Thoracic arterial arch vessel anatomy is widely patent. No evidence for pulmonary embolus. Mediastinum/Nodes: No mediastinal lymphadenopathy. Mild right hilar lymphadenopathy evident with 12 mm short axis lymph node on 73/5. The esophagus has normal imaging features. There is no axillary lymphadenopathy. Lungs/Pleura: Central irregular ground-glass opacity is seen in the upper lungs bilaterally. Mild dependent collapse/consolidation noted in both lower lobes. Moderate bilateral pleural effusions evident. No evidence for apical or thoracic inlet soft  tissue mass Upper Abdomen: Reflux of contrast material into the IVC and hepatic veins suggests right heart dysfunction. Musculoskeletal: No worrisome lytic or sclerotic osseous abnormality. No evidence for cervical ribs. Old posterior right rib fractures evident. Compression deformity at T6 is associated with diffuse sclerosis of the T6 vertebral body. Review of the MIP images confirms the above findings. IMPRESSION: 1. Patient was injected in the left upper extremity with prominent collateralization in the superficial anterior left chest wall. There is some possible mild venous narrowing in the left subclavian vein although assessment is limited by streak artifact from dense contrast bolus. Left innominate vein and SVC are widely patent. Arterial branch vessel anatomy from the aortic arch is widely patent. No evidence for apical lung mass. No upper mediastinal lymphadenopathy or mass lesion. 2. No CT evidence for acute pulmonary embolus. 3. Enlargement of the pulmonary outflow tract/main pulmonary arteries suggests pulmonary arterial hypertension. 4. Reflux of contrast material into the IVC and hepatic veins suggests right heart dysfunction. 5. Moderate bilateral pleural effusions with dependent collapse/consolidation in both lower lobes. 6. Central irregular ground-glass opacity in the upper lungs bilaterally. Imaging features can be seen in the setting of atypical infection, including viral pneumonia. 7. Mild right hilar lymphadenopathy, potentially reactive. Consider follow-up CT chest in 3 months to ensure stability. 8. Compression deformity at T6 with diffuse sclerosis of the T6 vertebral body. Imaging features may be related to chronic compression fracture, but sclerotic metastatic disease could have this appearance. MRI thoracic spine could be used to further evaluate as clinically warranted. Electronically Signed   By: Kennith Center M.D.   On: 02/23/2022 13:34  ? ?US Abdomen Limited RUQ (LIVER/GB) ? ?Result  Date: 02/23/2022 ?CLINICAL DATA:  71 year old male with transaminitis. EXAM: ULTRASOUND ABDOMEN LIMITED RIGHT UPPER QUADRANT COMPARISON:  01/02/2018 ultrasound FINDINGS: Gallbladder: A 7 mm gallstone and gallbladder sludge identified. There is no evidence of gallbladder wall thickening, pericholecystic fluid or sonographic Murphy sign. Common bile duct: Diameter: 4.5 mm. There is no evidence of intrahepatic or extrahepatic biliary dilatation. Liver: Increased hepatic echogenicity identified. No other hepatic abnormalities are identified but limited evaluation of the LEFT lobe due to bowel gas and motion noted. Portal vein is patent on color Doppler imaging with normal direction of blood flow towards the liver. Other: A RIGHT pleural effusion is identified. IMPRESSION: 1. Cholelithiasis without evidence of acute cholecystitis. No biliary dilatation. 2. Increased hepatic echogenicity compatible with hepatic  steatosis. Slightly limited evaluation of the liver as discussed. 3. RIGHT pleural effusion. Electronically Signed   By: Harmon Pier M.D.   On: 02/23/2022 20:53   ? ?Physical Exam ? ?General: Chronically ill elderly man laying in bed. No acute distress. ?CV: RRR. No m/r/g. No LE edema ?Pulmonary: Lungs CTAB. Normal effort. No wheezing or rales. ?Abdominal: Soft, nontender, nondistended. Normal bowel sounds. ?Extremities: 2+ distal pulses. Normal ROM. ?Skin: Warm and dry. No obvious rash or lesions. Dilated superficial veins over arms, shoulders and anterior chest wall.  ?Neuro: Drowsy but oriented x3. Moves all extremities. Normal sensation to gross touch.  ?Psych: Depressed mood ? ? ?Assessment/Plan: ?Alvin Benton is a 71 y.o. male with hx of PMH HTN, paroxysmal A-fib, tobacco use, alcohol use, COPD, and compression fractures who presented with shortness of breath and dyspnea and admitted for CHF exacerbation. ? ?Principal Problem: ?  Acute exacerbation of congestive heart failure (HCC) ? ?Diastolic heart failure,  new diagnosis ?Bilateral pleural effusion ?Patient is status post IV Lasix 6 mL yesterday with 2.25 L of output last 24 hours.  Respiratory status remained stable with O2 above 93% on room air. Patient e

## 2022-02-24 NOTE — TOC Initial Note (Signed)
Transition of Care (TOC) - Initial/Assessment Note  ? ? ?Patient Details  ?Name: Alvin Benton ?MRN: [id number] ?Date of Birth: 1951-05-16 ? ?Transition of Care (TOC) CM/SW Contact:    ?Alvin Castilla, LCSW ?Phone Number:[phone] ?02/24/2022, 11:13 AM ? ?Clinical Narrative:                 ?CSW met with patient to discuss PT recommendation of a SNF. Patient was aware of recommendation and in agreement with going to a [facility] SNF. CSW discussed the SNF process.CSW provided patient with medicare.gov rating list.  Patient gave CSW permission to fax referrals out to local facilities.CSW answered questions about the SNF process and the next steps in the process. ? ? Pt expressed knowing that he may need assisted living in the near future. ? ?TOC team will continue to assist with discharge planning needs.  ? ?Expected Discharge Plan: [place] ?Barriers to Discharge: Ship broker, Continued Medical Work up, SNF Pending bed offer ? ? ?Patient Goals and CMS Choice ?Patient states their goals for this hospitalization and ongoing recovery are:: Wants to get better ?CMS Medicare.gov Compare Post Acute Care list provided to:: Patient ?Choice offered to / list presented to : Patient ? ?Expected Discharge Plan and Services ?Expected Discharge Plan: [place] ?  ?  ?  ?Living arrangements for the past 2 months: [place] ?                ?  ?  ?  ?  ?  ?  ?  ?  ?  ?  ? ?Prior Living Arrangements/Services ?Living arrangements for the past 2 months: [place] ?Lives with:: Self ?Patient language and need for interpreter reviewed:: Yes ?       ?  ?Care giver support system in place?: No (comment) ?  ?  ? ?Activities of Daily Living ?Home Assistive Devices/Equipment: Wheelchair, Environmental consultant (specify type) ?ADL Screening (condition at time of admission) ?Patient's cognitive ability adequate to safely complete daily activities?: Yes ?Is the patient deaf or have difficulty hearing?:  No ?Does the patient have difficulty seeing, even when wearing glasses/contacts?: No ?Does the patient have difficulty concentrating, remembering, or making decisions?: No ?Patient able to express need for assistance with ADLs?: Yes ?Does the patient have difficulty dressing or bathing?: Yes ?Independently performs ADLs?: No ?Communication: Independent ?Dressing (OT): Needs assistance ?Is this a change from baseline?: Pre-admission baseline ?Grooming: Independent ?Feeding: Independent ?Bathing: Needs assistance ?Is this a change from baseline?: Pre-admission baseline ?Toileting: Independent with device (comment) ?In/Out Bed: Needs assistance ?Is this a change from baseline?: Pre-admission baseline ?Walks in Home: Independent with device (comment) ?Does the patient have difficulty walking or climbing stairs?: Yes ?Weakness of Legs: Both ?Weakness of Arms/Hands: Right ? ?Permission Sought/Granted ?  ?  ? Share Information with NAME: Alvin Benton- only if needed ? Permission granted to share info w AGENCY: SNFs ? Permission granted to share info w Relationship: Friend ? Permission granted to share info w Contact Information: [phone] ? ?Emotional Assessment ?Appearance:: Appears stated age ?Attitude/Demeanor/Rapport: Engaged ?Affect (typically observed): Adaptable, Accepting ?Orientation: : Oriented to Self, Oriented to Place, Oriented to  Time, Oriented to Situation ?  ?  ? ?Admission diagnosis:  Acute exacerbation of congestive heart failure ([place]) [I50.9] ?Patient Active Problem List  ? Diagnosis Date Noted  ? Acute exacerbation of congestive heart failure ([place]) 02/22/2022  ? Hyponatremia 05/26/2021  ? COPD (chronic obstructive pulmonary disease) ([place]) 05/26/2021  ?  Chronic back pain greater than 3 months duration 11/21/2020  ? Elevated troponin   ? Nausea & vomiting 07/21/2017  ? Alcoholic ketoacidosis 33/83/2919  ? Dehydration 05/20/2017  ? Near syncope 02/26/2017  ? History of atrial fibrillation 02/26/2017  ? Right  flank hematoma 02/26/2017  ? Zygomatic fracture, right side, initial encounter for closed fracture ([place]) 02/26/2017  ? Dizziness   ? Fall   ? Dehydration with hyponatremia 02/20/2017  ? Volume depletion 06/04/2016  ? Diverticulitis of intestine with perforation without bleeding 06/04/2016  ? Essential hypertension   ? Hypomagnesemia   ? Systolic murmur 16/60/6004  ? Anxiety state 04/19/2016  ? 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 Atrial fibrillation ([place]) 04/05/2016  ? Diverticulitis of large intestine with perforation and abscess 03/14/2016  ? Chronic constipation 03/08/2016  ? Alcohol abuse 02/15/2016  ? Tobacco use disorder 07/17/2015  ? Allergic rhinitis 07/17/2015  ? Reactive airway disease with wheezing 07/17/2015  ? Overweight (BMI 25.0-29.9) 07/17/2015  ? Hand pain, left 01/12/2014  ? LLQ pain-chronic, intermittent and episodic 09/17/2013  ? ?PCP:  Alvin Jews, PA-C ?Pharmacy:   ?[id number] - [place] [place] [address] ?[place] [address] ?Phone: [phone] Fax: [phone] ? ? ? ? ?Social Determinants of Health (SDOH) Interventions ?  ? ?Readmission Risk Interventions ?   ? View : No data to display.  ?  ?  ?  ? ? ? ?

## 2022-02-25 DIAGNOSIS — R9389 Abnormal findings on diagnostic imaging of other specified body structures: Secondary | ICD-10-CM | POA: Diagnosis not present

## 2022-02-25 DIAGNOSIS — I5033 Acute on chronic diastolic (congestive) heart failure: Secondary | ICD-10-CM | POA: Diagnosis not present

## 2022-02-25 LAB — BASIC METABOLIC PANEL
Anion gap: 8 (ref 5–15)
BUN: 13 mg/dL (ref 8–23)
CO2: 21 mmol/L — ABNORMAL LOW (ref 22–32)
Calcium: 8.9 mg/dL (ref 8.9–10.3)
Chloride: 102 mmol/L (ref 98–111)
Creatinine, Ser: 1.01 mg/dL (ref 0.61–1.24)
GFR, Estimated: >60 mL/min (ref >60)
Glucose, Bld: 97 mg/dL (ref 70–99)
Potassium: 4.5 mmol/L (ref 3.5–5.1)
Sodium: 131 mmol/L — ABNORMAL LOW (ref 135–145)

## 2022-02-25 MED ORDER — UMECLIDINIUM-VILANTEROL 62.5-25 MCG/ACT IN AEPB
1.0000 | INHALATION_SPRAY | Freq: Every day | RESPIRATORY_TRACT | Status: DC
  Administered 2022-02-25 – 2022-02-26 (×2): 1 via RESPIRATORY_TRACT
  Filled 2022-02-25: qty 14

## 2022-02-25 MED ORDER — HYDROXYZINE HCL 25 MG PO TABS
25.0000 mg | ORAL_TABLET | Freq: Four times a day (QID) | ORAL | Status: DC | PRN
  Administered 2022-02-25 – 2022-02-26 (×5): 25 mg via ORAL
  Filled 2022-02-25 (×5): qty 1

## 2022-02-25 MED ORDER — OXYCODONE-ACETAMINOPHEN 5-325 MG PO TABS
1.0000 | ORAL_TABLET | Freq: Four times a day (QID) | ORAL | Status: DC | PRN
  Administered 2022-02-25 – 2022-02-26 (×6): 1 via ORAL
  Filled 2022-02-25 (×6): qty 1

## 2022-02-25 MED ORDER — KETOROLAC TROMETHAMINE 30 MG/ML IJ SOLN
30.0000 mg | Freq: Once | INTRAMUSCULAR | Status: AC
  Administered 2022-02-25: 30 mg via INTRAVENOUS
  Filled 2022-02-25: qty 1

## 2022-02-25 NOTE — Consult Note (Signed)
? ?NAME:  Alvin NovaScott Lavey, MRN:  [id number], DOB:  [birth year], LOS: 3 ?ADMISSION DATE:  02/22/2022, CONSULTATION DATE:  02/25/2022 ?REFERRING MD:  Kirke CorinAmponsah, CHIEF COMPLAINT:  Small Pleural Effusions Dyspnea ? ?History of Present Illness:  ?Alvin Benton is a 71 y.o. recently quit smoker ( a few weeks, 40 pack year Hx) with a pertinent PMH of hypertension, atrial fibrillation, tobacco use, alcohol use, COPD, and multiple Lumbar compression fractures , who presented with shortness of breath and dyspnea and admitted for CHF exacerbation. He was diuresed to euvolemia, but CT Chest revealed  bilateral moderate pleural effusions with dependent collapse/consolidation in both lower lobes, in addition to suspected pulmonary hypertension,( Which was ruled out on Echo)  irregular ground-glass opacity in the upper lungs bilaterally,  suspicious for atypical infection, or  viral pneumonia and Mild right hilar lymphadenopathy. PCCM were called to evaluate for need for thoracentesis, and to assist in care ?  ? ?Pertinent  Medical History  ? ?Past Medical History:  ?Diagnosis Date  ? Atrial fibrillation (HCC) 06/2017  ? Blood in stool   ? Diverticulitis   ? ETOH abuse   ? GERD (gastroesophageal reflux disease)   ? Heart murmur   ? History of urinary tract infection   ? Hypertension   ? IBS (irritable bowel syndrome)   ? Migraine   ? "none in a long time" (03/14/2016)  ? Nocturia   ? Pneumonia   ? childhood  ? Seasonal allergies   ? Tobacco abuse   ?  ? ?Significant Hospital Events: ?Including procedures, antibiotic start and stop dates in addition to other pertinent events   ?Admission 02/22/2022 with Hypoxia,  Acute on Chronic CHF and COPD ? ?Interim History / Subjective:  ?Pt. States he has dyspnea with minimal exertion, and has for a  significant period of time.  ?He specifies that his back pain is his major issue, and is his most limiting issue.  + weight loss ?He is net negative 4500 cc's.  ?Na 131, K 4.5, CO2 21, Creatinine 1.01 (  0.90) ?Mag was 2.0 on 4/30 ?WBC 6.2, HGB 12.9, platelets 388 ?States due to back pain he cannot get to MD or pharmacy to get medications.>> Needs SNIF placement  ? ?Objective   ?Blood pressure (!) 122/57, pulse 62, temperature 98.3 ?F (36.8 ?C), temperature source Oral, resp. rate 18, height 5\' 9"  (1.753 m), weight 70.9 kg, SpO2 97 %. ?   ?   ? ?Intake/Output Summary (Last 24 hours) at 02/25/2022 1152 ?Last data filed at 02/25/2022 0757 ?Gross per 24 hour  ?Intake 600 ml  ?Output 1550 ml  ?Net -950 ml  ? ?Filed Weights  ? 02/23/22 0517 02/24/22 0415 02/25/22 0422  ?Weight: 68.2 kg 70.2 kg 70.9 kg  ? ? ?Examination: ?General:  Awake and Alert, slow to respond to questions, chronically appearing male ?HENT:  NCAT, PERRLA, No LAD, No JVD ?Lungs:  Bilateral chest excursion, Few rhonchi, diminished per bases bilaterally, crackles per bases, on RA.  ?Cardiovascular:  S1, S2, RRR, NSR with frequent PAC's per tele ?Abdomen:  Soft, NT, ND, BS +, Body mass index is 23.08 kg/m?. ?Extremities:  No obvious deformities notes, Brisk cap refill ?Neuro:  Awake and alert, word searching, delays in answering questions for processing ?GU:  NA ? ?Resolved Hospital Problem list   ? ? ?Assessment & Plan:  ? ?CHF, Hypoxemia, Pulmonary edema>> . Diuresed , net negative 4500 cc's.  ?Bilateral pleural effusions >> Evaluated by US, too small to do thoracentesis ?  Echo negative for Pulmonary HTN >> LVEF 60 to 65% ?40 pack year smoking history, recently quit smoker, recent weight loss >> Concern for malignancy>> Hyponatremia  ?COPD>. Not on maintenance therapy>> Dyspnea with minimal exertion  ?Plan ?Start Anoro 1 puff once daily ?Continue Albuterol nebs  ?Maintain euvolemia with diuresis per Primary team.  ?Follow up CT Chest without Contrast in 1 month, ( End of May 2023) then follow up in pulmonary office to review results.  ?Primary Team please order CT Chest once location of SNF is determined to schedule at closest [facility] ?Will need to  manage transportation from [facility] for Ct scan, and then Pulmonary OV to review results.  ? ?All rest per Primary team ? ? ?Best Practice (right click and "Reselect all SmartList Selections" daily)  ? ?Best Practice per Primary Team  ?Labs   ?CBC: ?Recent Labs  ?Lab 02/22/22 ?1059 02/22/22 ?1113 02/24/22 ?0231  ?WBC  --  6.4 6.2  ?NEUTROABS  --   --  3.9  ?HGB 12.6* 13.2 12.9*  ?HCT 37.0* 38.0* 37.5*  ?MCV  --  94.3 93.8  ?PLT  --  364 388  ? ? ?Basic Metabolic Panel: ?Recent Labs  ?Lab 02/22/22 ?1059 02/22/22 ?1113 02/23/22 ?0359 02/24/22 ?0231 02/25/22 ?0133  ?NA 126* 129* 130* 130* 131*  ?K 4.7 4.7 4.5 4.3 4.5  ?CL  --  100 99 98 102  ?CO2  --  21* 24 24 21*  ?GLUCOSE  --  90 99 97 97  ?BUN  --  7* 9 11 13   ?CREATININE  --  0.77 0.93 0.90 1.01  ?CALCIUM  --  8.7* 9.0 8.9 8.9  ?MG  --   --   --  2.0  --   ? ?GFR: ?Estimated Creatinine Clearance: 69 mL/min (by C-G formula based on SCr of 1.01 mg/dL). ?Recent Labs  ?Lab 02/22/22 ?1113 02/22/22 ?1821 02/24/22 ?0231  ?PROCALCITON  --  <0.10  --   ?WBC 6.4  --  6.2  ?LATICACIDVEN 1.0  --   --   ? ? ?Liver Function Tests: ?Recent Labs  ?Lab 02/22/22 ?1821  ?AST 20  ?ALT 14  ?ALKPHOS 69  ?BILITOT 1.0  ?PROT 6.6  ?ALBUMIN 3.3*  ? ?No results for input(s): LIPASE, AMYLASE in the last 168 hours. ?No results for input(s): AMMONIA in the last 168 hours. ? ?ABG ?   ?Component Value Date/Time  ? PHART 7.387 02/22/2022 1059  ? PCO2ART 34.8 02/22/2022 1059  ? PO2ART 114 (H) 02/22/2022 1059  ? HCO3 20.8 02/22/2022 1059  ? TCO2 22 02/22/2022 1059  ? ACIDBASEDEF 3.0 (H) 02/22/2022 1059  ? O2SAT 98 02/22/2022 1059  ?  ? ?Coagulation Profile: ?Recent Labs  ?Lab 02/22/22 ?1821  ?INR 1.1  ? ? ?Cardiac Enzymes: ?No results for input(s): CKTOTAL, CKMB, CKMBINDEX, TROPONINI in the last 168 hours. ? ?HbA1C: ?Hgb A1c MFr Bld  ?Date/Time Value Ref Range Status  ?08/27/2016 02:00 PM 4.4 (L) 4.8 - 5.6 % Final  ?  Comment:  ?  (NOTE) ?        Pre-diabetes: 5.7 - 6.4 ?        Diabetes: >6.4 ?         Glycemic control for adults with diabetes: <7.0 ?  ? ? ?CBG: ?No results for input(s): GLUCAP in the last 168 hours. ? ?Review of Systems:   ?Endorses dyspnea on minimal exertion, difficulty climbing stairs, back pain ? ?Past Medical History:  ?He,  has a past  medical history of Atrial fibrillation (HCC) (06/2017), Blood in stool, Diverticulitis, ETOH abuse, GERD (gastroesophageal reflux disease), Heart murmur, History of urinary tract infection, Hypertension, IBS (irritable bowel syndrome), Migraine, Nocturia, Pneumonia, Seasonal allergies, and Tobacco abuse.  ? ?Surgical History:  ? ?Past Surgical History:  ?Procedure Laterality Date  ? COLONOSCOPY    ? CYSTOSCOPY WITH STENT PLACEMENT Left 10/03/2016  ? Procedure: CYSTOSCOPY WITH  LEFT URETERAL CATHETER PLACEMENT;  Surgeon: Bjorn Pippin, MD;  Location: [facility];  Service: Urology;  Laterality: Left;  ? PROCTOSCOPY  10/03/2016  ? Procedure: PROCTOSCOPY;  Surgeon: Romie Levee, MD;  Location: [facility];  Service: General;;  ? RIGHT/LEFT HEART CATH AND CORONARY ANGIOGRAPHY N/A 07/22/2017  ? Procedure: RIGHT/LEFT HEART CATH AND CORONARY ANGIOGRAPHY;  Surgeon: [place]az[place], Peter M, MD;  Location: [facility]ASIVE CV [facility];  Service: Cardiovascular;  Laterality: N/A;  ? TONSILLECTOMY    ?  ? ?Social History:  ? reports that he has been smoking cigarettes. He has a 40.00 pack-year smoking history. He has never used smokeless tobacco. He reports current alcohol use of about 3.0 standard drinks per week. He reports that he does not use drugs.  ? ?Family History:  ?His family history includes Stroke in his mother. There is no history of Colon cancer, Esophageal cancer, Rectal cancer, or Stomach cancer.  ? ?Allergies ?No Known Allergies  ? ?Home Medications  ?Prior to Admission medications   ?Medication Sig Start Date End Date Taking? Authorizing Provider  ?albuterol (VENTOLIN HFA) 108 (90 Base) MCG/ACT inhaler Inhale 2 puffs into the lungs every 6 (six) hours as needed for wheezing or shortness  of breath. 04/13/21  Yes [provider]  ?lisinopril (ZESTRIL) 20 MG tablet Take 1 tablet (20 mg total) by mouth daily for 14 days. ?Patient not taking: Reported on 02/22/2022 02/12/22 02/26/22  Wallace Cullens,

## 2022-02-25 NOTE — TOC Progression Note (Addendum)
Transition of Care (TOC) - Progression Note  ? ? ?Patient Details  ?Name: Alvin Benton ?MRN: [id number] ?Date of Birth: Jun 27, 1951 ? ?Transition of Care (TOC) CM/SW Contact  ?Graysyn Bache Wynn Banker, LCSWA ?Phone Number: ?02/25/2022, 2:54 PM ? ?Clinical Narrative:    ?CSW provided bed offers and and substance resources. Pt refused substance resources stating he has never used any types of substances. Pt will review offers and make a decision by tomorrow.  ? ? ?Expected Discharge Plan: Skilled Nursing Facility ?Barriers to Discharge: English as a second language teacher, Continued Medical Work up, SNF Pending bed offer ? ?Expected Discharge Plan and Services ?Expected Discharge Plan: Skilled Nursing Facility ?  ?  ?  ?Living arrangements for the past 2 months: Single Family Home ?                ?  ?  ?  ?  ?  ?  ?  ?  ?  ?  ? ? ?Social Determinants of Health (SDOH) Interventions ?  ? ?Readmission Risk Interventions ?   ? View : No data to display.  ?  ?  ?  ? ? ?

## 2022-02-25 NOTE — Progress Notes (Addendum)
? ?Alvin Benton is a 71 y.o. male with hx of PMH HTN, paroxysmal A-fib, tobacco use, alcohol use, COPD, and compression fractures who presented with shortness of breath and dyspnea and admitted for CHF exacerbation with concern for lung malignancy on hospital day 3. ?Subjective:  ?Overnight:NAEON ? ?States his back pain is bothering him the most. He wants a stronger pain medications for his back pain. Asked if he could have surgery during this admission as he was supposed to have it after his injury.  ?Objective: ? ?Vital signs in last 24 hours: ?Vitals:  ? 02/24/22 1136 02/24/22 1645 02/24/22 2021 02/25/22 0422  ?BP: 127/61 140/71 (!) 142/53 (!) 141/63  ?Pulse: 75 79  74  ?Resp: 18 (!) 24 19 (!) 21  ?Temp: 98.5 ?F (36.9 ?C) 98.8 ?F (37.1 ?C) 98.4 ?F (36.9 ?C) 98.6 ?F (37 ?C)  ?TempSrc:  Oral Oral Oral  ?SpO2: 90% 96% 92% 94%  ?Weight:    70.9 kg  ?Height:      ? ?Supplemental O2: Room air  ? ?Filed Weights  ? 02/23/22 0517 02/24/22 0415 02/25/22 0422  ?Weight: 68.2 kg 70.2 kg 70.9 kg  ? ? ?Intake/Output Summary (Last 24 hours) at 02/25/2022 0646 ?Last data filed at 02/25/2022 0424 ?Gross per 24 hour  ?Intake 240 ml  ?Output 1450 ml  ?Net -1210 ml  ? ?Net IO Since Admission: -4,880 mL [02/25/22 0646] ?Physical Exam ?General: NAD, laying in bed comfortably ?Head: NCAT ?Lungs: CTAB, no wheeze, rhonchi or rales  ?Cardiovascular: NSR, 2+ radial pulses, No JVD, No LE edema ?Abdomen: no TTP, normal bowel sounds ?MSK: no asymmetry, normal bulk and tone ?Skin: no lesions on exposed skin ?Neuro: alert and oriented x4, 5/5 strength in bilateral LE.  ?Psych: normal mood and normal affect ? ?Diagnostics ?BMP shows stable hyponatremia and otherwise stable BMP.  ?Prior to Admission medications   ?Medication Sig Start Date End Date Taking? Authorizing Provider  ?albuterol (VENTOLIN HFA) 108 (90 Base) MCG/ACT inhaler Inhale 2 puffs into the lungs every 6 (six) hours as needed for wheezing or shortness of breath. 04/13/21  Yes [provider]  ?lisinopril (ZESTRIL) 20 MG tablet Take 1 tablet (20 mg total) by mouth daily for 14 days. ?Patient not taking: Reported on 02/22/2022 02/12/22 02/26/22  [name], DO  ? ?Assessment/Plan: ?Alvin Benton is a 71 y.o. male with hx of PMH HTN, paroxysmal A-fib, tobacco use, alcohol use, COPD, and compression fractures who presented with shortness of breath and dyspnea and admitted for CHF exacerbation with concern for lung malignancy on hospital day 3. ? ?Principal Problem: ?  Acute exacerbation of congestive heart failure ([place]) ? ?Diastolic heart failure, new diagnosis ?Bilateral pleural effusion ?Preserved EF, G1DD. Patient is status post IV Lasix 40 mg yesterday with 1.45 L of output last 24 hours. Patient was informed of the bilateral pleural effusion on CT and recommendations for thoracentesis. PCCM consulted and don't think the pleural effusion are large enough to drain.  ?--Monitor fluid status/diurese as needed, will give IV lasix 40 mg today. ?-Monitor strict input/output. Monitor daily weights ? ?Alcohol use disorder ?Alcohol withdrawal ?CIWA 15 yesterday evening but 1 overnight and this am. Alcohol levels were undetected 2 weeks ago. No ativan since 6 pm yesterday. Zofran given this morning. Since last drink over 2 weeks, will discontinue CIWA. He may be having anxiety at baseline and if present, can give prn hydroxyzine while inpatient.  ?-Will continue to monitor patient's anxiety level, will perform GAD 7  if anxiety persists but more preferable to perform as outpatient.  ?-PRN hydroxyzine  ? ?Concern for possible malignancy ?Patient hx and presentation was concerning for lung malignancy and SVC syndrome but CTA negative for SVC syndrome, lung mass or PE but shows moderate bilateral pleural effusions right heart dysfunction. Right upper quadrant ultrasound shows hepatic steatosis. PCCM consulted for thoracentesis but don't believe effusion is large enough to intervene without significant  risk of pneumothorax.  ?--Needs repeat chest CT in 3 months to ensure clearance of mild right hilar lymphadenopathy ?  ?Chronic hyponatremia ?Found to have sodium of 126 on admission. Sodium improved to 131 today. ?--Continue to monitor closely with daily BMP ? ?Spinal compression fractures ?Prior compression fractures with osteonecrosis of L4, spinal stenosis L3-L5 resulting in severe disability and limited mobility. Patient has refused TLSO brace in the past. He wanted stronger pain medication for pain. 1 dose of Toradol given. Will schedule tylenol and percocet 5 mg q6hrs prn. Can give Toradol given good renal fxn if patient having breakthrough pain.  ?--PT recommending SNF, currently pending bed offer ?--CSW following, appreciate assistance with disposition ?  ?HTN ?Chronic.  SBP 120s to 140s the last 24 hours. ?--Continue Coreg 12.5 mg twice daily ?--Continue lisinopril 20 mg daily ?--Continue amlodipine 5 mg daily ?  ?COPD ?Chronic.  ?--Continue as needed albuterol ? ?Diet: Normal ?IVF: None ?VTE: Lovenox ?Code: Full ?PT/OT recs: SNF for Subacute PT ?Prior to Admission Living Arrangement:Home ?Anticipated Discharge Location:SNF ?Barriers to Discharge:Placement ?Dispo: Anticipated discharge in approximately 1-2 day(s).  ? ?Alvin Schuller, MD ?[facility] ?Internal Medicine Residency, PGY-1  ?Pager: [phone] ?After 5 pm and on weekends: Please call the on-call pager  ?

## 2022-02-25 NOTE — Care Management Important Message (Signed)
Important Message ? ?Patient Details  ?Name: Alvin Benton ?MRN: [id number] ?Date of Birth: Sep 28, 1951 ? ? ?Medicare Important Message Given:  Yes ? ? ? ? ?Renie Ora ?02/25/2022, 9:07 AM ?

## 2022-02-26 LAB — CBC
HCT: 38.8 % — ABNORMAL LOW (ref 39.0–52.0)
Hemoglobin: 13.2 g/dL (ref 13.0–17.0)
MCH: 32.7 pg (ref 26.0–34.0)
MCHC: 34 g/dL (ref 30.0–36.0)
MCV: 96 fL (ref 80.0–100.0)
Platelets: 420 10*3/uL — ABNORMAL HIGH (ref 150–400)
RBC: 4.04 MIL/uL — ABNORMAL LOW (ref 4.22–5.81)
RDW: 14.3 % (ref 11.5–15.5)
WBC: 8.8 10*3/uL (ref 4.0–10.5)
nRBC: 0 % (ref 0.0–0.2)

## 2022-02-26 LAB — BASIC METABOLIC PANEL
Anion gap: 8 (ref 5–15)
BUN: 18 mg/dL (ref 8–23)
CO2: 21 mmol/L — ABNORMAL LOW (ref 22–32)
Calcium: 8.8 mg/dL — ABNORMAL LOW (ref 8.9–10.3)
Chloride: 100 mmol/L (ref 98–111)
Creatinine, Ser: 0.98 mg/dL (ref 0.61–1.24)
GFR, Estimated: >60 mL/min (ref >60)
Glucose, Bld: 96 mg/dL (ref 70–99)
Potassium: 5 mmol/L (ref 3.5–5.1)
Sodium: 129 mmol/L — ABNORMAL LOW (ref 135–145)

## 2022-02-26 MED ORDER — FOLIC ACID 1 MG PO TABS: 1.0000 mg | ORAL_TABLET | Freq: Every day | ORAL | 0 refills | Status: AC

## 2022-02-26 MED ORDER — FUROSEMIDE 40 MG PO TABS: 40.0000 mg | ORAL_TABLET | Freq: Every day | ORAL | 0 refills | Status: AC

## 2022-02-26 MED ORDER — NAPROXEN 375 MG PO TABS: 375.0000 mg | ORAL_TABLET | Freq: Two times a day (BID) | ORAL | 0 refills | Status: AC

## 2022-02-26 MED ORDER — HYDROXYZINE HCL 25 MG PO TABS: 25.0000 mg | ORAL_TABLET | Freq: Four times a day (QID) | ORAL | 0 refills | Status: AC | PRN

## 2022-02-26 MED ORDER — OXYCODONE-ACETAMINOPHEN 5-325 MG PO TABS: 1.0000 | ORAL_TABLET | Freq: Four times a day (QID) | ORAL | 0 refills | Status: AC | PRN

## 2022-02-26 MED ORDER — DICLOFENAC SODIUM 1 % EX GEL: 2.0000 g | Freq: Four times a day (QID) | CUTANEOUS | 0 refills | Status: DC

## 2022-02-26 MED ORDER — THIAMINE HCL 100 MG PO TABS: 100.0000 mg | ORAL_TABLET | Freq: Every day | ORAL | 0 refills | Status: AC

## 2022-02-26 MED ORDER — AMLODIPINE BESYLATE 5 MG PO TABS: 5.0000 mg | ORAL_TABLET | Freq: Every day | ORAL | 0 refills | Status: AC

## 2022-02-26 MED ORDER — ACETAMINOPHEN 325 MG PO TABS
650.0000 mg | ORAL_TABLET | ORAL | 0 refills | Status: AC | PRN
Start: 2022-02-26 — End: 2022-03-28

## 2022-02-26 MED ORDER — CARVEDILOL 12.5 MG PO TABS: 12.5000 mg | ORAL_TABLET | Freq: Two times a day (BID) | ORAL | 0 refills | Status: AC

## 2022-02-26 MED ORDER — CALCIUM GLUCONATE-NACL 1-0.675 GM/50ML-% IV SOLN
1.0000 g | Freq: Once | INTRAVENOUS | Status: AC
  Administered 2022-02-26: 1000 mg via INTRAVENOUS
  Filled 2022-02-26: qty 50

## 2022-02-26 MED ORDER — UMECLIDINIUM-VILANTEROL 62.5-25 MCG/ACT IN AEPB: 1.0000 | INHALATION_SPRAY | Freq: Every day | RESPIRATORY_TRACT | Status: AC

## 2022-02-26 MED ORDER — ADULT MULTIVITAMIN W/MINERALS CH
1.0000 | ORAL_TABLET | Freq: Every day | ORAL | 0 refills | Status: AC
Start: 2022-02-27 — End: 2022-03-29

## 2022-02-26 MED ORDER — SODIUM ZIRCONIUM CYCLOSILICATE 5 G PO PACK
5.0000 g | PACK | Freq: Once | ORAL | Status: AC
  Administered 2022-02-26: 5 g via ORAL
  Filled 2022-02-26: qty 1

## 2022-02-26 MED ORDER — HYDROCHLOROTHIAZIDE 25 MG PO TABS: 25.0000 mg | ORAL_TABLET | Freq: Every day | ORAL | 0 refills | Status: AC

## 2022-02-26 NOTE — TOC Transition Note (Signed)
Transition of Care (TOC) - CM/SW Discharge Note ? ? ?Patient Details  ?Name: Alvin Benton ?MRN: [id number] ?Date of Birth: 07/02/1951 ? ?Transition of Care (TOC) CM/SW Contact:  ?Ivette Loyal, LCSWA ?Phone Number: ?02/26/2022, 2:18 PM ? ? ?Clinical Narrative:    ?Patient will DC to: [facility] ?Anticipated DC date: 02/26/2022 ?Family notified: Pt asked not to contact anyone ?Transport by: Sharin Mons ? ? ?Per MD patient ready for DC [facility]. RN to call report prior to discharge [phone]). RN, patient, patient's family, and facility notified of DC. Discharge Summary and FL2 sent to facility. DC packet on chart. Ambulance transport requested for patient.  ? ?CSW will sign off for now as social work intervention is no longer needed. Please consult [place] again if new needs arise. ?  ? ? ?Final next level of care: Skilled Nursing Facility ?Barriers to Discharge: English as a second language teacher, Continued Medical Work up, SNF Pending bed offer ? ? ?Patient Goals and CMS Choice ?Patient states their goals for this hospitalization and ongoing recovery are:: Wants to get better ?CMS Medicare.gov Compare Post Acute Care list provided to:: Patient ?Choice offered to / list presented to : Patient ? ?Discharge Placement ?  ?           ?  ?  ?  ?  ? ?Discharge Plan and Services ?  ?  ?           ?  ?  ?  ?  ?  ?  ?  ?  ?  ?  ? ?Social Determinants of Health (SDOH) Interventions ?  ? ? ?Readmission Risk Interventions ?   ? View : No data to display.  ?  ?  ?  ? ? ? ? ? ?

## 2022-02-26 NOTE — Progress Notes (Signed)
OT Cancellation Note ? ?Patient Details ?Name: Alvin Benton ?MRN: [id number] ?DOB: August 03, 1951 ? ? ?Cancelled Treatment:     Pt. Refused. He stated he has had visitors all day and does not want OT at this time. He stated OT can come back later.  ? ?Christalynn Boise ?02/26/2022, 12:03 PM ?

## 2022-02-26 NOTE — TOC Progression Note (Addendum)
Transition of Care (TOC) - Progression Note  ? ? ?Patient Details  ?Name: Alvin Benton ?MRN: [id number] ?Date of Birth: July 04, 1951 ? ?Transition of Care (TOC) CM/SW Contact  ?Khole Arterburn Wynn Banker, LCSWA ?Phone Number: ?02/26/2022, 11:56 AM ? ?Clinical Narrative:    ?CSW spoke with pt about bed offers, pt would like to go back to [facility] for rehab but stated he wants a ALF and has no medicaid. CSW emailed financial counseling to get pt medicaid application started. CSW will continue to work on pt DC to [facility].  ? ?Pt auth ref# [id number] ? ? ? ?Expected Discharge Plan: Skilled Nursing Facility ?Barriers to Discharge: English as a second language teacher, Continued Medical Work up, SNF Pending bed offer ? ?Expected Discharge Plan and Services ?Expected Discharge Plan: Skilled Nursing Facility ?  ?  ?  ?Living arrangements for the past 2 months: Single Family Home ?                ?  ?  ?  ?  ?  ?  ?  ?  ?  ?  ? ? ?Social Determinants of Health (SDOH) Interventions ?  ? ?Readmission Risk Interventions ?   ? View : No data to display.  ?  ?  ?  ? ? ?

## 2022-02-26 NOTE — Progress Notes (Signed)
Physical Therapy Treatment ?Patient Details ?Name: Alvin Benton ?MRN: [id number] ?DOB: [birth year] ?Today's Date: 02/26/2022 ? ? ?History of Present Illness 71 yo male admitted from [facility] 4/28 with hypoxia and CHF. Pt with admission at [facility] 4/16-4/18 due to back pain with T11, L2, L4 compression fx who refused TLSO and D/Cd to SNF. PMhx: Afib, IBS, HTN, COPD, AS ? ?  ?PT Comments  ? ? Pt received in recliner, agreeable to therapy session with encouragement with emphasis on spinal precaution instruction, seated/standing LE exercises, transfer safety and progression of activity within tolerance. Pt refusing to don TLSO despite instruction on benefits and decreased pain as brace supporting injured area. Pt states he had another brace at SNF which was more comfortable for him to don, MD notified and gave clearance for mobility w/o brace. Pt needing up to minA with mod cues for safe technique for transfer and pre-gait tasks. Pt impulsive to sit as pain increased and unable to tolerate standing more than 2 minutes at a time with RW and external minA for safety. Pt with noted short term memory deficits unable to recall back precs, reinforced. Pt continues to benefit from PT services to progress toward functional mobility goals.   ?Recommendations for follow up therapy are one component of a multi-disciplinary discharge planning process, led by the attending physician.  Recommendations may be updated based on patient status, additional functional criteria and insurance authorization. ? ?Follow Up Recommendations ? Skilled nursing-short term rehab (<3 hours/day) ?  ?  ?Assistance Recommended at Discharge Intermittent Supervision/Assistance  ?Patient can return home with the following A lot of help with walking and/or transfers;Help with stairs or ramp for entrance;A little help with bathing/dressing/bathroom;Assistance with cooking/housework;Assist for transportation;Direct supervision/assist for medications management ?   ?Equipment Recommendations ? Rolling walker (2 wheels);BSC/3in1  ?  ?Recommendations for Other Services   ? ? ?  ?Precautions / Restrictions Precautions ?Precautions: Fall;Back ?Precaution Booklet Issued: No ?Precaution Comments: pt verbally educated for back precautions ?Required Braces or Orthoses: Spinal Brace ?Spinal Brace: Thoracolumbosacral orthotic;Applied in sitting position (pt refusing to don despite encouragement, states he had one prior admission that was more comfortable -possibly can try an LSO?) ?Restrictions ?Weight Bearing Restrictions: No  ?  ? ?Mobility ? Bed Mobility ?  ?  ?  ?  ?  ?  ?  ?General bed mobility comments: in recliner pre and post session ?  ? ?Transfers ?Overall transfer level: Needs assistance ?Equipment used: Rolling walker (2 wheels) ?Transfers: Sit to/from Stand ?Sit to Stand: Min assist ?  ?  ?  ?  ?  ?General transfer comment: only able to stand briefly minA for stability upon rising and for controlled lowering due to decreased eccentric control upon sitting. ?  ? ?Ambulation/Gait ?Ambulation/Gait assistance: Min assist ?  ?Assistive device: Rolling walker (2 wheels) ?  ?  ?  ?Pre-gait activities: standing BLE AROM: hip flexion x5-10 reps x3 trials (10 at the most at one time prior to sitting back down) ?General Gait Details: defer as pt refusing to don brace and in too much pain after pre-gait activity to progress gait ? ? ?Stairs ?  ?  ?  ?  ?  ? ? ?Wheelchair Mobility ?  ? ?Modified Rankin (Stroke Patients Only) ?  ? ? ?  ?Balance Overall balance assessment: Needs assistance, History of Falls ?Sitting-balance support: No upper extremity supported, Feet supported ?Sitting balance-Leahy Scale: Fair ?  ?  ?Standing balance support: Single extremity supported, Reliant on assistive device for  balance, During functional activity ?Standing balance-Leahy Scale: Poor ?Standing balance comment: reliant on RW or external assist for all standing tasks ?  ?  ?  ?  ?  ?  ?  ?  ?  ?  ?   ?  ? ?  ?Cognition Arousal/Alertness: Awake/alert ?Behavior During Therapy: Restless, Impulsive ?Overall Cognitive Status: No family/caregiver present to determine baseline cognitive functioning ?Area of Impairment: Safety/judgement, Problem solving, Memory ?  ?  ?  ?  ?  ?  ?  ?  ?  ?  ?Memory: Decreased short-term memory, Decreased recall of precautions ?Following Commands: Follows one step commands inconsistently ?Safety/Judgement: Decreased awareness of safety, Decreased awareness of deficits ?  ?Problem Solving: Requires verbal cues ?General Comments: Pt following commands during session, but impulsive and easily irritated (pt requesting staff "go away" while using urinal although he is impulsive and per previous notes was unsafe with urinal/other devices). Pt reports no recall of back precautions although he has had them at least since 4/16. Pt refusing to don back brace for stability despite discussion of benefits. ?  ?  ? ?  ?Exercises Other Exercises ?Other Exercises: seated BLE AROM: marching (while trunk reclined in chair to ~40* incline to prevent >90* hip flexion), LAQ x10 reps ea needs multimodal cues to complete all reps with safe technique ?Other Exercises: STS x 3 reps ?Other Exercises: standing BLE AROM: hip flexion x5-10 reps ea x3 sets ? ?  ?General Comments General comments (skin integrity, edema, etc.): HR 61 bpm resting and up to 75 bpm with exertion; SpO2 WFL on RA; no dizziness reported with standing tasks ?  ?  ? ?Pertinent Vitals/Pain Pain Assessment ?Pain Assessment: Faces ?Faces Pain Scale: Hurts whole lot ?Pain Location: back with seated LE exercises, transfers and static/dynamic standing tasks despite compliance with back precautions during session ?Pain Descriptors / Indicators: Discomfort, Grimacing, Sharp, Shooting ?Pain Intervention(s): Limited activity within patient's tolerance, Monitored during session, Premedicated before session, Repositioned (encouraged use of brace for  stability, pt refuses and states "the one I had at the other facility was bearable, I can't wear this one".)  ? ? ? ?PT Goals (current goals can now be found in the care plan section) Acute Rehab PT Goals ?Patient Stated Goal: be able to walk ?PT Goal Formulation: With patient ?Time For Goal Achievement: 03/09/22 ?Progress towards PT goals: Progressing toward goals ? ?  ?Frequency ? ? ? Min 2X/week ? ? ? ?  ?PT Plan Current plan remains appropriate  ? ? ?   ?AM-PAC PT "6 Clicks" Mobility   ?Outcome Measure ? Help needed turning from your back to your side while in a flat bed without using bedrails?: A Lot (anticipate mod cues due to back precs) ?Help needed moving from lying on your back to sitting on the side of a flat bed without using bedrails?: A Lot (anticipate mod cues due to back precs) ?Help needed moving to and from a bed to a chair (including a wheelchair)?: A Lot (mod cues) ?Help needed standing up from a chair using your arms (e.g., wheelchair or bedside chair)?: A Little ?Help needed to walk in hospital room?: Total ?Help needed climbing 3-5 steps with a railing? : Total ?6 Click Score: 11 ? ?  ?End of Session Equipment Utilized During Treatment: Gait belt;Other (comment) (pt refusing TLSO, MD notified and clearance for mobility w/o brace per MD [place]) ?Activity Tolerance: Patient limited by pain ?Patient left: in chair;with call bell/phone within reach;with chair  alarm set ?Nurse Communication: Mobility status;Other (comment);Patient requests pain meds (pain limiting progression, pt refusing back brace, MD also notified of non-compliance with brace) ?PT Visit Diagnosis: History of falling (Z91.81);Other symptoms and signs involving the nervous system (R29.898);Difficulty in walking, not elsewhere classified (R26.2);Other abnormalities of gait and mobility (R26.89) ?  ? ? ?Time: [id number] ?PT Time Calculation (min) (ACUTE ONLY): 18 min ? ?Charges:  $Therapeutic Exercise: 8-22 mins          ?           ? ?Ulis Kaps P., PTA ?Acute Rehabilitation Services ?Secure Chat Preferred 9a-5:30pm ?Office: [phone]  ? ? ?Kara Pacer Chayton Murata ?02/26/2022, 2:59 PM ? ?

## 2022-02-26 NOTE — Progress Notes (Addendum)
Attempted to call report. On hold 10 + minutes. Will try again ? ?Report given to facility RN Lauris Poag) ?

## 2022-02-26 NOTE — Discharge Summary (Addendum)
? ?Name: Alvin Benton ?MRN: [id number] ?DOB: May 11, 1951 71 y.o. ?PCP: Judd LienWebb, Meghan H, PA-C ? ?Date of Admission: 02/22/2022 10:01 AM ?Date of Discharge: 02/26/22 ?Attending Physician: Reymundo PollGuilloud, Carolyn MD ? ?Discharge Diagnosis: ?Principal Problem: ?  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 ?Discharge Medications: ?Allergies as of 02/26/2022   ?No Known Allergies ?  ? ?  ?Medication List  ?  ? ?STOP taking these medications   ? ?lisinopril 20 MG tablet ?Commonly known as: ZESTRIL ?  ? ?  ? ?TAKE these medications   ? ?acetaminophen 325 MG tablet ?Commonly known as: TYLENOL ?Take 2 tablets (650 mg total) by mouth every 4 (four) hours as needed for headache or mild pain. ?  ?albuterol 108 (90 Base) MCG/ACT inhaler ?Commonly known as: VENTOLIN HFA ?Inhale 2 puffs into the lungs every 6 (six) hours as needed for wheezing or shortness of breath. ?  ?amLODipine 5 MG tablet ?Commonly known as: NORVASC ?Take 1 tablet (5 mg total) by mouth daily. ?Start taking on: Feb 27, 2022 ?  ?carvedilol 12.5 MG tablet ?Commonly known as: COREG ?Take 1 tablet (12.5 mg total) by mouth 2 (two) times daily with a meal. ?  ?diclofenac Sodium 1 % Gel ?Commonly known as: VOLTAREN ?Apply 2 g topically 4 (four) times daily. Apply to the back. ?  ?folic acid 1 MG tablet ?Commonly known as: FOLVITE ?Take 1 tablet (1 mg total) by mouth daily. ?Start taking on: Feb 27, 2022 ?  ?furosemide 40 MG tablet ?Commonly known as: Lasix ?Take 1 tablet (40 mg total) by mouth daily. ?  ?hydrochlorothiazide 25 MG tablet ?Commonly known as: HYDRODIURIL ?Take 1 tablet (25 mg total) by mouth daily. ?  ?hydrOXYzine 25 MG tablet ?Commonly known as: ATARAX ?Take 1  tablet (25 mg total) by mouth every 6 (six) hours as needed for anxiety. ?  ?multivitamin with minerals Tabs tablet ?Take 1 tablet by mouth daily. ?Start taking on: Feb 27, 2022 ?  ?naproxen 375 MG tablet ?Commonly known as: NAPROSYN ?Take 1 tablet (375 mg total) by mouth 2 (two) times daily with a meal. ?  ?oxyCODONE-acetaminophen 5-325 MG tablet ?Commonly known as: PERCOCET/ROXICET ?Take 1 tablet by mouth every 6 (six) hours as needed for moderate pain or severe pain. ?  ?thiamine 100 MG tablet ?Take 1 tablet (100 mg total) by mouth daily. ?Start taking on: Feb 27, 2022 ?  ?umeclidinium-vilanterol 62.5-25 MCG/ACT Aepb ?Commonly known as: ANORO ELLIPTA ?Inhale 1 puff into the lungs daily. ?Start taking on: Feb 27, 2022 ?  ? ?  ? ?  ?  ? ? ?  ?Durable Medical Equipment  ?(From admission, onward)  ?  ? ? ?  ? ?  Start     Ordered  ? 02/26/22 1355  DME Walker  Once       ?Question Answer Comment  ?Walker: With 5 Inch Wheels   ?Patient needs a walker to treat with the following condition Weakness   ?  ? 02/26/22 1409  ? 02/26/22 1355  DME 3-in-1  Once       ? 02/26/22 1409  ? 02/26/22 1355  DME standard manual wheelchair with seat cushion  Once       ?Comments: Patient suffers from chronic back pain due to known fractures which impairs their ability  to perform daily activities like ambulating in the home.  A walker will not resolve issue with performing activities of daily living. A wheelchair will allow patient to safely perform daily activities. Patient can safely propel the wheelchair in the home or has a caregiver who can provide assistance. Length of need 6 months . ?Accessories: elevating leg rests (ELRs), wheel locks, extensions and anti-tippers.  ? 02/26/22 1409  ? ?  ?  ? ?  ? ? ?Disposition and follow-up:   ?Mr.Deagan Sevin was discharged from [facility] in Stable condition.  At the hospital follow up visit please address: ? ?1.  CHF/HTN/Paroxsymal Afib: ensure patient is euvolemic and his  antihypertensive regimen is controlling his bp. Start AC if indicated (see below). Offered AC to patient given suspected hypercoagulable state and paroxysmal a.fib but he refused stating his arrhythmia has resolved and he was in atrial fibrillation for a short period.  ?2.  Hilar lymphadenopathy: Ensure repeat imaging to check for resolution.  ?3.  Hyponatremia: Monitor on repeat BMP ?4. Chronic Compression fractures: Imaging noted of chronic compression fractures; ensure patient has orthopedic surgeon follow up scheduled. ? ?2.  Labs / imaging needed at time of follow-up: CBC, BMP ? ?3.  Pending labs/ test needing follow-up: None ? ?Follow-up Appointments: ? Follow-up Information   ? ? Hyman Hopes, Meghan H, PA-C. Call today.   ?Specialty: Physician Assistant ?Why: Call to make an appointment with your PCP in 7-10 days of discharge. ?Contact information: ?[address] ?[address] ?[place] [address] ?[phone] ? ? ?  ?  ? ? [facility]. Call in 1 day(s).   ?Specialty: Pulmonology ?Why: Call to make a follow up appointment with pulmonology for early June follow up. ?Contact information: ?[address] ?[place] [address] ?[phone] ? ?  ?  ? ?  ?  ? ?  ? ? ?Hospital Course by problem list: ?HPI by Dr. Ephriam Knuckles ?"Alvin Benton is a chronically ill 71 year old male with history of atrial fibrillation, aortic stenosis, and COPD who was transferred to [facility] emergency department from his skilled facility after being found hypoxic.  Patient was able to contribute to history taking however would consider it somewhat unreliable due to encephalopathy. ? ?He reports a 1 week history of progressive shortness of breath, nonproductive cough, nausea, and abdominal pain.  Shortness of breath is constant but worse with laying down.  Denies fevers or chills.  He reports nausea but has not yet vomited.  Abdominal pain is diffuse. ? ?Note, patient presented to the ED on 4/16 for back  pain and some confusion.  At that time, he had said that his back pain had been worsening however today he tells me that it is stable.  Lumbar spine CT showed multiple compression fractures however patient refused to wear a TLSO brace or work with PT.  He refused to go home because he could not take care of himself anymore so he remained in the ED until transitions of care was able to find placement for him.  He was discharged to a [facility] on 4/18." ? ? ? ?New Diastolic Heart Failure ?Bilateral pleural effusion ?Patient presentation of dyspnea and elevated BNP pointed toward CHF exacerbation. Echo performed and showed preserved EF, G1DD. Patient is status post multiple doses of IV Lasix  and -5.3 L net I/O with diuresis. Patient noted to have bilateral pleural effusion on CT. PCCM consulted and want to follow up outpatient. Chest imaging  with concerning findings (see below) and Pulmonary critical care medicine recommends repeating imaging in one month to check for resolution (see below). Patient appears more euvolemic. We will continue 40 mg of oral lasix and and 25 mg of HCTZ at discharge.  ? ? ?Bilateral upper lobe ground glass opacities ?Concern for possible malignancy ?Patient hx of tobacco use disorder and presentation with dyspnea was concerning for lung malignancy and SVC syndrome was considered given dilated superficial veins over anterior chest and a positive Pemberton's test but CTA negative for SVC syndrome, lung mass or pulmonary embolism. The CTA did  demonstrated ground glass opacities, mild hilar lymphadenopathy and moderate bilateral pleural effusions as mentioned above. It showed enlargement of pulmonary arteries suggestive of PAH but echo showed normal RV size and function. Pulmonary critical care medicine consulted and don't think the pleural effusion are large enough to drain. They recommend outpatient appointment for repeat imaging to follow up on the lymphadenopathy and to follow up on the  effusions as well. CTA and lumbar MRI showed compression fractures and noted a lesion at T6 that had findings consistent with sclerotic metastatic disease. MRI thoracic spine was recommended. Please perform

## 2022-02-26 NOTE — Discharge Instructions (Signed)
You presented to the ED with shortness of breath and were found to have a heart failure exacerbation. We performed an ultrasound of your heart and it shows your heart is pumping well but has difficulty fully relaxing. This can happen when it becomes stiff which can be due to high blood pressure. We gave you IV lasix and that removed the extra fluid from your body. We will change one of your blood pressure medicines to HCTZ which can help get rid of extra fluid from your body. The best way you can control this is by ensuring your blood pressure is controlled. Your chest imaging showed some findings that we will want to follow up in one month. Someone will call you to repeat a chest CT scan at the end of this month. The pulmonary doctors want to see you in the clinic. Your biggest complaint was back pain and imaging shows chronic fractures. We will discharge you with tylenol, short course of oxycodone, and short dose of naproxen. You had follow up with orthopedic surgeon previously. As you are interested in back surgery, I would advise you to reschedule with them for repeat evaluation. Please take all your medications and follow up with your PCP.  ?

## 2022-02-27 NOTE — Progress Notes (Addendum)
On securing patient for transport, patient attempted and failed to push away prescribed back brace stating "I don't want that". Educated patient on purpose of device and how it can improve his condition. Patient stated that we "could have it". Informed patient that this item is not reusable and would have to be disposed of. Patient expressed understanding and approval, stating "I'm just going to throw it away if you don't". ? ?Patient safely transported with [facility]. All personal belongings save for aforementioned back brace accounted for. IV access removed. By AM nurse. Report given by AM nurse, discharge paperwork provided to [facility] on exit. ?

## 2022-03-12 ENCOUNTER — Other Ambulatory Visit: Payer: Self-pay | Admitting: Student

## 2022-03-12 NOTE — Telephone Encounter (Signed)
Not a Clinic patient. Has a pvt PCP.   Pharmacy has refill on file.   ?

## 2023-05-20 ENCOUNTER — Other Ambulatory Visit (HOSPITAL_COMMUNITY): Payer: Self-pay | Admitting: Neurosurgery

## 2023-05-20 DIAGNOSIS — M48061 Spinal stenosis, lumbar region without neurogenic claudication: Secondary | ICD-10-CM

## 2023-06-09 ENCOUNTER — Ambulatory Visit (HOSPITAL_COMMUNITY)
Admission: RE | Admit: 2023-06-09 | Discharge: 2023-06-09 | Disposition: A | Discharge status: Home / Self Care | Payer: Medicare Other | Source: Ambulatory Visit | Attending: Neurosurgery | Admitting: Neurosurgery

## 2023-06-09 DIAGNOSIS — M48061 Spinal stenosis, lumbar region without neurogenic claudication: Secondary | ICD-10-CM | POA: Insufficient documentation

## 2023-06-27 ENCOUNTER — Other Ambulatory Visit: Payer: Self-pay | Admitting: Neurosurgery

## 2023-07-28 ENCOUNTER — Encounter (HOSPITAL_COMMUNITY): Payer: Self-pay | Admitting: Neurosurgery

## 2023-07-28 NOTE — Pre-Procedure Instructions (Signed)
INSTRUCTIONS ONLY!   Patient comes from [facility].  Instructions faxed over to April the unit director. Fax number: [phone]  Patient verbally denies any shortness of breath, fever, cough and chest pain during phone call   -------------  SDW INSTRUCTIONS given:  Your procedure is scheduled on Wendesday, October 2nd.  Report to [facility] Main Entrance "A" at 0830 A.M., and check in at the Admitting office.  Call this number if you have problems the morning of surgery:  [phone]   Remember:  Do not eat or drink after midnight the night before your surgery    Take these medicines the morning of surgery with A SIP OF WATER  ALPRAZolam (XANAX)  amLODipine (NORVASC)  carvedilol (COREG)  fexofenadine (ALLEGRA)  fluticasone (FLONASE)  gabapentin (NEURONTIN)  sertraline (ZOLOFT)  umeclidinium-vilanterol (ANORO ELLIPTA)  acetaminophen (TYLENOL)-if needed albuterol (VENTOLIN HFA)-if needed (Please bring on the day of surgery) ondansetron (ZOFRAN)-if needed  As of today, STOP taking any Aspirin (unless otherwise instructed by your surgeon) acetaminophen-caffeine (EXCEDRIN TENSION HEADACHE) Aleve, Naproxen, Ibuprofen, Motrin, Advil, Goody's, BC's, all herbal medications, fish oil, and all vitamins.                      Do not wear jewelry, make up, or nail polish            Do not wear lotions, powders, perfumes/colognes, or deodorant.            Do not shave 48 hours prior to surgery.  Men may shave face and neck.            Do not bring valuables to the hospital.            [facility] is not responsible for any belongings or valuables.  Do NOT Smoke (Tobacco/Vaping) 24 hours prior to your procedure  If you use a CPAP at night, you may bring all equipment for your overnight stay.   Contacts, glasses, dentures or bridgework may not be worn into surgery.      For patients admitted to the hospital, discharge time will be determined by your treatment team.   Patients  discharged the day of surgery will not be allowed to drive home, and someone needs to stay with them for 24 hours.    Special instructions:   [facility]- Preparing For Surgery  Before surgery, you can play an important role. Because skin is not sterile, your skin needs to be as free of germs as possible. You can reduce the number of germs on your skin by washing with CHG (chlorahexidine gluconate) Soap before surgery.  CHG is an antiseptic cleaner which kills germs and bonds with the skin to continue killing germs even after washing.    Oral Hygiene is also important to reduce your risk of infection.  Remember - BRUSH YOUR TEETH THE MORNING OF SURGERY WITH YOUR REGULAR TOOTHPASTE  Please do not use if you have an allergy to CHG or antibacterial soaps. If your skin becomes reddened/irritated stop using the CHG.  Do not shave (including legs and underarms) for at least 48 hours prior to first CHG shower. It is OK to shave your face.  Please follow these instructions carefully.   Shower the NIGHT BEFORE SURGERY and the MORNING OF SURGERY with DIAL Soap.   Pat yourself dry with a CLEAN TOWEL.  Wear CLEAN PAJAMAS to bed the night before surgery  Place CLEAN SHEETS on your bed the night of your first shower  and DO NOT SLEEP WITH PETS.   Day of Surgery: Please shower morning of surgery  Wear Clean/Comfortable clothing the morning of surgery Do not apply any deodorants/lotions.   Remember to brush your teeth WITH YOUR REGULAR TOOTHPASTE.   Questions were answered. Patient verbalized understanding of instructions.

## 2023-07-29 NOTE — Progress Notes (Signed)
April (the Water quality scientist) from [employer] called and stated that they didn't receive any information/instructions for this patient's surgery scheduled for tomorrow morning.This Clinical research associate faxed again the instructions and also gave instructions over the phone to April. All questions were answered.

## 2023-07-29 NOTE — Progress Notes (Signed)
Anesthesia Chart Review: SAME DAY WORK-UP  Case: [id number] Date/Time: 07/30/23 1044   Procedure: Sublaminar decompression, SPINOUS PROCESS PLATING - L3-L4 - L4-L5 - bilateral (Bilateral)   Anesthesia type: General   Pre-op diagnosis: Stenosis   [facility]: [facility] ROOM 19 / [facility] OR   Surgeons: Coletta Memos, MD       DISCUSSION: Patient is a 72 year old male scheduled for the above procedure.  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  He is now residing at [facility] at [facility] since April 2023.     He is a same day work-up. He is currently living in a SNF for over a year now. There are no recent EKG or labs to review (> 65 year old), so unless more recent results received from SNF then he will need those on arrival for surgery. It appears he has only seen cardiology in 2018 when he had aflutter. Anticoagulation not recommended at that time due to increased bleeding risk due to alcohol abuse. He had normal coronaries, minimal AS with gradient 4 mmHg on 07/22/17 cardiac cath. 02/22/22 echo showed LVEF 60-65%, no regional wall motion abnormalities, grade 1 diastolic dysfunction, moderate MR, mild-moderate AR with no AS noted.   He is a same day work-up. Echo was within the past 2 years. Will be further evaluated on  the day of surgery. Additional input for his anesthesia team following patient evaluation and review of most recent EKG and lab results.     PROVIDERS: Judd Lien, PA-C is listed as PCP ([facility]), last office visit noted is from 11/07/20.  He currently lives in [employer] at [place] [phone]). Reported unit manager is "[place]."    LABS: For day of surgery as indicated.    IMAGES: MRI L-spine 06/09/23: IMPRESSION: 1. No acute osseous injury of the lumbar spine. 2. Chronic L2, L4, and L5 vertebral body compression fractures as described above. 3. At L3-4 there is a broad-based disc bulge. Moderate bilateral facet arthropathy. Mild retropulsion of the superior posterior corner of the L4 vertebral body impressing upon the thecal sac. Overall, multifactorial severe spinal stenosis. Moderate-severe bilateral foraminal stenosis. 4. At L4-5 there is a broad-based disc bulge. Severe left and moderate right facet arthropathy. Mild retropulsion of the superior posterior corner of the L5 vertebral body. Overall multifactorial severe spinal stenosis. Mild bilateral foraminal stenosis.  [place] Abd 02/23/22: IMPRESSION: 1. Cholelithiasis without evidence of acute cholecystitis. No biliary dilatation. 2. Increased hepatic echogenicity compatible with hepatic steatosis. Slightly limited evaluation of the liver as discussed. 3. RIGHT pleural effusion.  CTA Chest 02/23/22: IMPRESSION: 1. Patient was injected in the left upper extremity with prominent collateralization in the superficial anterior left chest wall. There is some possible mild venous narrowing in the left subclavian vein although assessment is limited by streak artifact from dense contrast bolus. Left innominate vein and SVC are widely patent. Arterial branch vessel anatomy from the aortic arch is widely patent. No evidence for apical lung mass. No upper  mediastinal lymphadenopathy or mass lesion. 2. No CT  evidence for acute pulmonary embolus. 3. Enlargement of the pulmonary outflow tract/main pulmonary arteries suggests pulmonary arterial hypertension. 4. Reflux of contrast material into the IVC and hepatic veins suggests right heart dysfunction. 5. Moderate bilateral pleural effusions with dependent collapse/consolidation in both lower lobes. 6. Central irregular ground-glass opacity in the upper lungs bilaterally. Imaging features can be seen in the setting of atypical infection, including viral pneumonia. 7. Mild right hilar lymphadenopathy, potentially reactive. Consider follow-up CT chest in 3 months to ensure stability. 8. Compression deformity at T6 with diffuse sclerosis of the T6 vertebral body. Imaging features may be related to chronic compression fracture, but sclerotic metastatic disease could have this appearance. MRI thoracic spine could be used to further evaluate as clinically warranted. - Evaluated in-patient by pulmonologist Myrla Halsted, MD, "Overall not sure what to make of imaging: not typical for malignancy. Would give time and repeat imaging in about a month, if persistent could consider bronchoscopy to r/o smoldering infection or something like sarcoid. Patient is asymptomatic at present." He did not schedule post-hospitalization follow-up. He has been residing in a SNF.   EKG: Last EKG noted is > 1 year ago.   EKG 02/26/22: Sinus bradycardia at 56 bpm Moderate voltage criteria for LVH, may be normal variant ( R in aVL , Sokolow-Lyon ) Septal infarct , age undetermined Abnormal ECG When compared with ECG of 22-Feb-2022 11:41, PREVIOUS ECG IS PRESENT decreased rate Confirmed by Charlton Haws [phone]) on 02/26/2022 10:17:25 AM   CV: Echo 02/22/22: IMPRESSIONS   1. Left ventricular ejection fraction, by estimation, is 60 to 65%. The  left ventricle has normal function. The left ventricle has no regional  wall motion abnormalities. There is mild left ventricular  hypertrophy.  Left ventricular diastolic parameters  are consistent with Grade I diastolic dysfunction (impaired relaxation).   2. Right ventricular systolic function is normal. The right ventricular  size is normal.   3. The mitral valve is normal in structure. Moderate mitral valve  regurgitation. No evidence of mitral stenosis.   4. The aortic valve is tricuspid. There is mild calcification of the  aortic valve. There is mild thickening of the aortic valve. Aortic valve  regurgitation is mild to moderate. Aortic valve sclerosis is present, with  no evidence of aortic valve  stenosis.   5. The inferior vena cava is normal in size with greater than 50%  respiratory variability, suggesting right atrial pressure of 3 mmHg.    RHC/LHC 07/22/17: The left ventricular systolic function is normal. LV end diastolic pressure is normal. The left ventricular ejection fraction is 55-65% by visual estimate. LV end diastolic pressure is normal.   1. Normal coronary anatomy- left dominant. 2. Normal LV function 3. Normal right heart and LV filling pressures. 4. Minimal aortic stenosis with gradient of 4 mm Hg.   Plan: medical management.   Past Medical History:  Diagnosis Date   Atrial fibrillation (HCC) 06/2017   Blood in stool    CHF (congestive heart failure) (HCC)    COPD (chronic obstructive pulmonary disease) (HCC)    Diverticulitis    ETOH abuse    GERD (gastroesophageal reflux disease)    Heart murmur    Hepatic steatosis    History of urinary tract infection    Hypertension    IBS (irritable bowel syndrome)    Migraine    "none in a long time" (03/14/2016)   Nocturia    Pneumonia  childhood   Seasonal allergies    SIADH (syndrome of inappropriate ADH production) (HCC)    Tobacco abuse     Past Surgical History:  Procedure Laterality Date   COLONOSCOPY     CYSTOSCOPY WITH STENT PLACEMENT Left 10/03/2016   Procedure: CYSTOSCOPY WITH  LEFT URETERAL CATHETER PLACEMENT;   Surgeon: Bjorn Pippin, MD;  Location: [facility] ORS;  Service: Urology;  Laterality: Left;   PROCTOSCOPY  10/03/2016   Procedure: PROCTOSCOPY;  Surgeon: Romie Levee, MD;  Location: [facility] ORS;  Service: General;;   RIGHT/LEFT HEART CATH AND CORONARY ANGIOGRAPHY N/A 07/22/2017   Procedure: RIGHT/LEFT HEART CATH AND CORONARY ANGIOGRAPHY;  Surgeon: [place]az[place], Peter M, MD;  Location: [facility] INVASIVE CV LAB;  Service: Cardiovascular;  Laterality: N/A;   TONSILLECTOMY      MEDICATIONS: No current facility-administered medications for this encounter.    acetaminophen (TYLENOL) 325 MG tablet   acetaminophen-caffeine (EXCEDRIN TENSION HEADACHE) 500-65 MG TABS per tablet   albuterol (VENTOLIN HFA) 108 (90 Base) MCG/ACT inhaler   ALPRAZolam (XANAX) 0.5 MG tablet   amLODipine (NORVASC) 5 MG tablet   ammonium lactate (LAC-HYDRIN) 12 % lotion   atorvastatin (LIPITOR) 20 MG tablet   carvedilol (COREG) 12.5 MG tablet   cholecalciferol (VITAMIN D3) 25 MCG (1000 UNIT) tablet   fexofenadine (ALLEGRA) 180 MG tablet   fluticasone (FLONASE) 50 MCG/ACT nasal spray   furosemide (LASIX) 40 MG tablet   gabapentin (NEURONTIN) 300 MG capsule   hydrochlorothiazide (HYDRODIURIL) 25 MG tablet   lidocaine (LIDODERM) 5 %   losartan (COZAAR) 25 MG tablet   Multiple Vitamins-Minerals (MULTIVITAMIN WITH MINERALS) tablet   ondansetron (ZOFRAN) 4 MG tablet   polyethylene glycol (MIRALAX / GLYCOLAX) 17 g packet   Polypodium Leucotomos 240 MG CAPS   Potassium Chloride ER 20 MEQ TBCR   sertraline (ZOLOFT) 25 MG tablet   SUNSCREENS EX   trolamine salicylate (ASPERCREME) 10 % cream   umeclidinium-vilanterol (ANORO ELLIPTA) 62.5-25 MCG/ACT AEPB    lactated ringers infusion    Shonna Chock, PA-C Surgical Short Stay/Anesthesiology [facility] Phone [phone] [facility] Phone [phone] 07/29/2023 2:26 PM

## 2023-07-29 NOTE — Anesthesia Preprocedure Evaluation (Addendum)
Anesthesia Evaluation  Patient identified by MRN, date of birth, ID band Patient awake    Reviewed: Allergy & Precautions, NPO status , Patient's Chart, lab work & pertinent test results, reviewed documented beta blocker date and time   History of Anesthesia Complications Negative for: history of anesthetic complications  Airway Mallampati: II  TM Distance: >3 FB Neck ROM: Full    Dental  (+) Poor Dentition   Pulmonary COPD, Current Smoker and Patient abstained from smoking.   breath sounds clear to auscultation       Cardiovascular hypertension, (-) angina +CHF  (-) CAD, (-) Past MI and (-) Cardiac Stents + dysrhythmias Atrial Fibrillation + Valvular Problems/Murmurs MR and AI  Rhythm:Regular Rate:Normal + Systolic murmurs and + Diastolic murmurs Echo 02/22/22: IMPRESSIONS  1. Left ventricular ejection fraction, by estimation, is 60 to 65%. The  left ventricle has normal function. The left ventricle has no regional  wall motion abnormalities. There is mild left ventricular hypertrophy.  Left ventricular diastolic parameters  are consistent with Grade I diastolic dysfunction (impaired relaxation).  2. Right ventricular systolic function is normal. The right ventricular  size is normal.  3. The mitral valve is normal in structure. Moderate mitral valve  regurgitation. No evidence of mitral stenosis.  4. The aortic valve is tricuspid. There is mild calcification of the  aortic valve. There is mild thickening of the aortic valve. Aortic valve  regurgitation is mild to moderate. Aortic valve sclerosis is present, with  no evidence of aortic valve  stenosis.  5. The inferior vena cava is normal in size with greater than 50%  respiratory variability, suggesting right atrial pressure of 3 mmHg.      Neuro/Psych  Headaches PSYCHIATRIC DISORDERS Anxiety        GI/Hepatic ,GERD  ,,(+)     substance abuse  alcohol use   Endo/Other    Renal/GU      Musculoskeletal   Abdominal   Peds  Hematology   Anesthesia Other Findings   Reproductive/Obstetrics                             Anesthesia Physical Anesthesia Plan  ASA: 3  Anesthesia Plan: General   Post-op Pain Management:    Induction: Intravenous  PONV Risk Score and Plan: 1 and Ondansetron and Dexamethasone  Airway Management Planned: Oral ETT  Additional Equipment:   Intra-op Plan:   Post-operative Plan: Extubation in OR  Informed Consent: I have reviewed the patients History and Physical, chart, labs and discussed the procedure including the risks, benefits and alternatives for the proposed anesthesia with the patient or authorized representative who has indicated his/her understanding and acceptance.     Dental advisory given  Plan Discussed with: CRNA  Anesthesia Plan Comments: (See PAT note written 07/29/2023 by Shonna Chock, PA-C.  )       Anesthesia Quick Evaluation

## 2023-07-30 ENCOUNTER — Inpatient Hospital Stay (HOSPITAL_COMMUNITY): Payer: Medicare Other | Admitting: Vascular Surgery

## 2023-07-30 ENCOUNTER — Encounter (HOSPITAL_COMMUNITY): Payer: Self-pay | Admitting: Neurosurgery

## 2023-07-30 ENCOUNTER — Other Ambulatory Visit: Payer: Self-pay

## 2023-07-30 ENCOUNTER — Inpatient Hospital Stay (HOSPITAL_COMMUNITY): Payer: Medicare Other

## 2023-07-30 ENCOUNTER — Inpatient Hospital Stay (HOSPITAL_COMMUNITY): Payer: Self-pay | Admitting: Vascular Surgery

## 2023-07-30 ENCOUNTER — Encounter (HOSPITAL_COMMUNITY)
Admission: RE | Disposition: A | Discharge status: Skilled Nursing Facility | Payer: Self-pay | Source: Home / Self Care | Attending: Neurosurgery

## 2023-07-30 ENCOUNTER — Inpatient Hospital Stay (HOSPITAL_COMMUNITY)
Admission: RE | Admit: 2023-07-30 | Discharge: 2023-08-01 | DRG: 519 | Disposition: A | Discharge status: Skilled Nursing Facility | Payer: Medicare Other | Attending: Neurosurgery | Admitting: Neurosurgery

## 2023-07-30 DIAGNOSIS — F17210 Nicotine dependence, cigarettes, uncomplicated: Secondary | ICD-10-CM | POA: Diagnosis present

## 2023-07-30 DIAGNOSIS — Z79899 Other long term (current) drug therapy: Secondary | ICD-10-CM

## 2023-07-30 DIAGNOSIS — F10129 Alcohol abuse with intoxication, unspecified: Secondary | ICD-10-CM | POA: Diagnosis present

## 2023-07-30 DIAGNOSIS — I480 Paroxysmal atrial fibrillation: Secondary | ICD-10-CM | POA: Diagnosis present

## 2023-07-30 DIAGNOSIS — K219 Gastro-esophageal reflux disease without esophagitis: Secondary | ICD-10-CM | POA: Diagnosis present

## 2023-07-30 DIAGNOSIS — J449 Chronic obstructive pulmonary disease, unspecified: Secondary | ICD-10-CM | POA: Diagnosis present

## 2023-07-30 DIAGNOSIS — I5032 Chronic diastolic (congestive) heart failure: Secondary | ICD-10-CM | POA: Diagnosis present

## 2023-07-30 DIAGNOSIS — I4892 Unspecified atrial flutter: Secondary | ICD-10-CM | POA: Diagnosis present

## 2023-07-30 DIAGNOSIS — Z823 Family history of stroke: Secondary | ICD-10-CM

## 2023-07-30 DIAGNOSIS — M48062 Spinal stenosis, lumbar region with neurogenic claudication: Principal | ICD-10-CM | POA: Diagnosis present

## 2023-07-30 DIAGNOSIS — M48061 Spinal stenosis, lumbar region without neurogenic claudication: Secondary | ICD-10-CM

## 2023-07-30 HISTORY — DX: Chronic obstructive pulmonary disease, unspecified: J44.9

## 2023-07-30 HISTORY — PX: LUMBAR LAMINECTOMY WITH SPINOUS PROCESS PLATE 2 LEVEL: SHX5958

## 2023-07-30 HISTORY — DX: Heart failure, unspecified: I50.9

## 2023-07-30 HISTORY — DX: Syndrome of inappropriate secretion of antidiuretic hormone: E22.2

## 2023-07-30 HISTORY — DX: Fatty (change of) liver, not elsewhere classified: K76.0

## 2023-07-30 LAB — CBC
HCT: 45.5 % (ref 39.0–52.0)
Hemoglobin: 15.7 g/dL (ref 13.0–17.0)
MCH: 29.9 pg (ref 26.0–34.0)
MCHC: 34.5 g/dL (ref 30.0–36.0)
MCV: 86.7 fL (ref 80.0–100.0)
Platelets: 291 10*3/uL (ref 150–400)
RBC: 5.25 MIL/uL (ref 4.22–5.81)
RDW: 14.2 % (ref 11.5–15.5)
WBC: 7.3 10*3/uL (ref 4.0–10.5)
nRBC: 0 % (ref 0.0–0.2)

## 2023-07-30 LAB — BASIC METABOLIC PANEL
Anion gap: 12 (ref 5–15)
BUN: 12 mg/dL (ref 8–23)
CO2: 24 mmol/L (ref 22–32)
Calcium: 9.2 mg/dL (ref 8.9–10.3)
Chloride: 94 mmol/L — ABNORMAL LOW (ref 98–111)
Creatinine, Ser: 0.85 mg/dL (ref 0.61–1.24)
GFR, Estimated: >60 mL/min (ref >60)
Glucose, Bld: 93 mg/dL (ref 70–99)
Potassium: 3.6 mmol/L (ref 3.5–5.1)
Sodium: 130 mmol/L — ABNORMAL LOW (ref 135–145)

## 2023-07-30 LAB — SURGICAL PCR SCREEN
MRSA, PCR: POSITIVE — AB
Staphylococcus aureus: POSITIVE — AB

## 2023-07-30 SURGERY — LUMBAR LAMINECTOMY WITH SPINOUS PROCESS PLATE 2 LEVEL
Anesthesia: General | Laterality: Bilateral

## 2023-07-30 MED ORDER — SODIUM CHLORIDE 0.9% FLUSH
3.0000 mL | Freq: Two times a day (BID) | INTRAVENOUS | Status: DC
  Administered 2023-07-30 – 2023-08-01 (×3): 3 mL via INTRAVENOUS

## 2023-07-30 MED ORDER — LIDOCAINE 2% (20 MG/ML) 5 ML SYRINGE
INTRAMUSCULAR | Status: DC | PRN
  Administered 2023-07-30 (×2): 100 mg via INTRAVENOUS

## 2023-07-30 MED ORDER — CARVEDILOL 12.5 MG PO TABS
12.5000 mg | ORAL_TABLET | Freq: Two times a day (BID) | ORAL | Status: DC
  Administered 2023-07-30 – 2023-08-01 (×4): 12.5 mg via ORAL
  Filled 2023-07-30 (×4): qty 1

## 2023-07-30 MED ORDER — LIDOCAINE-EPINEPHRINE 0.5 %-1:200000 IJ SOLN
INTRAMUSCULAR | Status: AC
  Filled 2023-07-30: qty 50

## 2023-07-30 MED ORDER — FENTANYL CITRATE (PF) 100 MCG/2ML IJ SOLN
25.0000 ug | INTRAMUSCULAR | Status: DC | PRN
  Administered 2023-07-30 (×2): 50 ug via INTRAVENOUS

## 2023-07-30 MED ORDER — PROPOFOL 10 MG/ML IV BOLUS
INTRAVENOUS | Status: AC
  Filled 2023-07-30: qty 20

## 2023-07-30 MED ORDER — CHLORHEXIDINE GLUCONATE CLOTH 2 % EX PADS
6.0000 | MEDICATED_PAD | Freq: Once | CUTANEOUS | Status: DC
  Administered 2023-07-30: 6 via TOPICAL

## 2023-07-30 MED ORDER — LOSARTAN POTASSIUM 50 MG PO TABS
25.0000 mg | ORAL_TABLET | Freq: Every evening | ORAL | Status: DC
  Administered 2023-07-30 – 2023-07-31 (×2): 25 mg via ORAL
  Filled 2023-07-30 (×2): qty 1

## 2023-07-30 MED ORDER — ACETAMINOPHEN 325 MG PO TABS
650.0000 mg | ORAL_TABLET | ORAL | Status: DC | PRN
  Administered 2023-07-31 – 2023-08-01 (×4): 650 mg via ORAL
  Filled 2023-07-30 (×4): qty 2

## 2023-07-30 MED ORDER — ONDANSETRON HCL 4 MG/2ML IJ SOLN: 4.0000 mg | Freq: Four times a day (QID) | INTRAMUSCULAR | Status: DC | PRN

## 2023-07-30 MED ORDER — DEXAMETHASONE SODIUM PHOSPHATE 10 MG/ML IJ SOLN
INTRAMUSCULAR | Status: AC
  Filled 2023-07-30: qty 1

## 2023-07-30 MED ORDER — GABAPENTIN 300 MG PO CAPS
300.0000 mg | ORAL_CAPSULE | Freq: Three times a day (TID) | ORAL | Status: DC
  Administered 2023-07-30 – 2023-08-01 (×5): 300 mg via ORAL
  Filled 2023-07-30 (×5): qty 1

## 2023-07-30 MED ORDER — CEFAZOLIN SODIUM-DEXTROSE 2-4 GM/100ML-% IV SOLN
INTRAVENOUS | Status: AC
  Filled 2023-07-30: qty 100

## 2023-07-30 MED ORDER — MIDAZOLAM HCL 2 MG/2ML IJ SOLN
INTRAMUSCULAR | Status: DC | PRN
  Administered 2023-07-30: 2 mg via INTRAVENOUS

## 2023-07-30 MED ORDER — AMLODIPINE BESYLATE 5 MG PO TABS
5.0000 mg | ORAL_TABLET | Freq: Every day | ORAL | Status: DC
  Administered 2023-07-31 – 2023-08-01 (×2): 5 mg via ORAL
  Filled 2023-07-30 (×2): qty 1

## 2023-07-30 MED ORDER — OXYCODONE HCL ER 10 MG PO T12A
10.0000 mg | EXTENDED_RELEASE_TABLET | Freq: Two times a day (BID) | ORAL | Status: DC
  Administered 2023-07-30 – 2023-08-01 (×4): 10 mg via ORAL
  Filled 2023-07-30 (×4): qty 1

## 2023-07-30 MED ORDER — MENTHOL 3 MG MT LOZG: 1.0000 | LOZENGE | OROMUCOSAL | Status: DC | PRN

## 2023-07-30 MED ORDER — ONDANSETRON HCL 4 MG/2ML IJ SOLN: 4.0000 mg | Freq: Once | INTRAMUSCULAR | Status: DC | PRN

## 2023-07-30 MED ORDER — THROMBIN (RECOMBINANT) 5000 UNITS EX SOLR
CUTANEOUS | Status: DC | PRN
  Administered 2023-07-30: 10 mL via TOPICAL

## 2023-07-30 MED ORDER — HYDROMORPHONE HCL 1 MG/ML IJ SOLN
INTRAMUSCULAR | Status: DC | PRN
Start: 2023-07-30 — End: 2023-07-30
  Administered 2023-07-30: .5 mg via INTRAVENOUS

## 2023-07-30 MED ORDER — OXYCODONE HCL 5 MG PO TABS
10.0000 mg | ORAL_TABLET | ORAL | Status: DC | PRN
  Administered 2023-07-30 – 2023-08-01 (×6): 10 mg via ORAL
  Filled 2023-07-30 (×6): qty 2

## 2023-07-30 MED ORDER — ACETAMINOPHEN 10 MG/ML IV SOLN
INTRAVENOUS | Status: AC
  Filled 2023-07-30: qty 100

## 2023-07-30 MED ORDER — VITAMIN D 25 MCG (1000 UNIT) PO TABS
3000.0000 [IU] | ORAL_TABLET | Freq: Every day | ORAL | Status: DC
  Administered 2023-07-31 – 2023-08-01 (×2): 3000 [IU] via ORAL
  Filled 2023-07-30 (×3): qty 3

## 2023-07-30 MED ORDER — OXYCODONE HCL 5 MG/5ML PO SOLN: 5.0000 mg | Freq: Once | ORAL | Status: DC | PRN

## 2023-07-30 MED ORDER — KETAMINE HCL 10 MG/ML IJ SOLN
INTRAMUSCULAR | Status: DC | PRN
Start: 2023-07-30 — End: 2023-07-30
  Administered 2023-07-30 (×2): 10 mg via INTRAVENOUS

## 2023-07-30 MED ORDER — PHENOL 1.4 % MT LIQD: 1.0000 | OROMUCOSAL | Status: DC | PRN

## 2023-07-30 MED ORDER — PHENYLEPHRINE 80 MCG/ML (10ML) SYRINGE FOR IV PUSH (FOR BLOOD PRESSURE SUPPORT)
PREFILLED_SYRINGE | INTRAVENOUS | Status: AC
  Filled 2023-07-30: qty 10

## 2023-07-30 MED ORDER — POTASSIUM CHLORIDE CRYS ER 10 MEQ PO TBCR
20.0000 meq | EXTENDED_RELEASE_TABLET | Freq: Every day | ORAL | Status: DC
  Administered 2023-07-31 – 2023-08-01 (×2): 20 meq via ORAL
  Filled 2023-07-30 (×3): qty 2

## 2023-07-30 MED ORDER — ROCURONIUM BROMIDE 10 MG/ML (PF) SYRINGE
PREFILLED_SYRINGE | INTRAVENOUS | Status: AC
  Filled 2023-07-30: qty 10

## 2023-07-30 MED ORDER — ACETAMINOPHEN 10 MG/ML IV SOLN: 1000.0000 mg | Freq: Once | INTRAVENOUS | Status: DC | PRN

## 2023-07-30 MED ORDER — 0.9 % SODIUM CHLORIDE (POUR BTL) OPTIME
TOPICAL | Status: DC | PRN
Start: 2023-07-30 — End: 2023-07-30
  Administered 2023-07-30: 1000 mL

## 2023-07-30 MED ORDER — PHENYLEPHRINE HCL (PRESSORS) 10 MG/ML IV SOLN
INTRAVENOUS | Status: AC
  Filled 2023-07-30: qty 1

## 2023-07-30 MED ORDER — FLUTICASONE PROPIONATE 50 MCG/ACT NA SUSP
1.0000 | Freq: Every day | NASAL | Status: DC
  Filled 2023-07-30: qty 16

## 2023-07-30 MED ORDER — MIDAZOLAM HCL 2 MG/2ML IJ SOLN
INTRAMUSCULAR | Status: AC
  Filled 2023-07-30: qty 2

## 2023-07-30 MED ORDER — SERTRALINE HCL 25 MG PO TABS
25.0000 mg | ORAL_TABLET | Freq: Every day | ORAL | Status: DC
  Administered 2023-07-30 – 2023-08-01 (×3): 25 mg via ORAL
  Filled 2023-07-30 (×3): qty 1

## 2023-07-30 MED ORDER — LORATADINE 10 MG PO TABS
10.0000 mg | ORAL_TABLET | Freq: Every day | ORAL | Status: DC
  Administered 2023-07-30 – 2023-08-01 (×3): 10 mg via ORAL
  Filled 2023-07-30 (×3): qty 1

## 2023-07-30 MED ORDER — ROCURONIUM BROMIDE 10 MG/ML (PF) SYRINGE
PREFILLED_SYRINGE | INTRAVENOUS | Status: DC | PRN
  Administered 2023-07-30: 10 mg via INTRAVENOUS
  Administered 2023-07-30: 80 mg via INTRAVENOUS

## 2023-07-30 MED ORDER — BISACODYL 5 MG PO TBEC: 5.0000 mg | DELAYED_RELEASE_TABLET | Freq: Every day | ORAL | Status: DC | PRN

## 2023-07-30 MED ORDER — ALPRAZOLAM 0.5 MG PO TABS
0.5000 mg | ORAL_TABLET | Freq: Two times a day (BID) | ORAL | Status: DC
  Administered 2023-07-30 – 2023-08-01 (×4): 0.5 mg via ORAL
  Filled 2023-07-30 (×4): qty 1

## 2023-07-30 MED ORDER — LACTATED RINGERS IV SOLN: INTRAVENOUS | Status: DC

## 2023-07-30 MED ORDER — CHLORHEXIDINE GLUCONATE 0.12 % MT SOLN: 15.0000 mL | Freq: Once | OROMUCOSAL | Status: AC

## 2023-07-30 MED ORDER — DEXMEDETOMIDINE HCL IN NACL 80 MCG/20ML IV SOLN
INTRAVENOUS | Status: DC | PRN
Start: 2023-07-30 — End: 2023-07-30
  Administered 2023-07-30: 8 ug via INTRAVENOUS
  Administered 2023-07-30: 4 ug via INTRAVENOUS
  Administered 2023-07-30: 8 ug via INTRAVENOUS
  Administered 2023-07-30 (×2): 4 ug via INTRAVENOUS

## 2023-07-30 MED ORDER — POLYETHYLENE GLYCOL 3350 17 G PO PACK
17.0000 g | PACK | Freq: Every day | ORAL | Status: DC
  Administered 2023-07-30 – 2023-08-01 (×3): 17 g via ORAL
  Filled 2023-07-30 (×3): qty 1

## 2023-07-30 MED ORDER — ALBUTEROL SULFATE HFA 108 (90 BASE) MCG/ACT IN AERS: 2.0000 | INHALATION_SPRAY | Freq: Four times a day (QID) | RESPIRATORY_TRACT | Status: DC | PRN

## 2023-07-30 MED ORDER — SUGAMMADEX SODIUM 200 MG/2ML IV SOLN
INTRAVENOUS | Status: DC | PRN
  Administered 2023-07-30: 200 mg via INTRAVENOUS

## 2023-07-30 MED ORDER — OXYCODONE HCL 5 MG PO TABS: 5.0000 mg | ORAL_TABLET | Freq: Once | ORAL | Status: DC | PRN

## 2023-07-30 MED ORDER — ACETAMINOPHEN 500 MG PO TABS
1000.0000 mg | ORAL_TABLET | Freq: Four times a day (QID) | ORAL | Status: AC
  Administered 2023-07-31 (×2): 1000 mg via ORAL
  Filled 2023-07-30 (×3): qty 2

## 2023-07-30 MED ORDER — POTASSIUM CHLORIDE IN NACL 20-0.9 MEQ/L-% IV SOLN
INTRAVENOUS | Status: DC
  Filled 2023-07-30 (×2): qty 1000

## 2023-07-30 MED ORDER — DIAZEPAM 5 MG PO TABS: 5.0000 mg | ORAL_TABLET | Freq: Four times a day (QID) | ORAL | Status: DC | PRN

## 2023-07-30 MED ORDER — ALBUTEROL SULFATE (2.5 MG/3ML) 0.083% IN NEBU: 2.5000 mg | INHALATION_SOLUTION | Freq: Four times a day (QID) | RESPIRATORY_TRACT | Status: DC | PRN

## 2023-07-30 MED ORDER — LIDOCAINE 2% (20 MG/ML) 5 ML SYRINGE
INTRAMUSCULAR | Status: AC
  Filled 2023-07-30: qty 5

## 2023-07-30 MED ORDER — SODIUM CHLORIDE 0.9 % IV SOLN
250.0000 mL | INTRAVENOUS | Status: DC
  Administered 2023-07-30: 250 mL via INTRAVENOUS

## 2023-07-30 MED ORDER — ORAL CARE MOUTH RINSE: 15.0000 mL | Freq: Once | OROMUCOSAL | Status: AC

## 2023-07-30 MED ORDER — FUROSEMIDE 40 MG PO TABS
40.0000 mg | ORAL_TABLET | Freq: Every day | ORAL | Status: DC
  Administered 2023-07-30 – 2023-08-01 (×3): 40 mg via ORAL
  Filled 2023-07-30 (×3): qty 1

## 2023-07-30 MED ORDER — ADULT MULTIVITAMIN W/MINERALS CH
1.0000 | ORAL_TABLET | Freq: Every day | ORAL | Status: DC
  Administered 2023-07-30 – 2023-08-01 (×3): 1 via ORAL
  Filled 2023-07-30 (×3): qty 1

## 2023-07-30 MED ORDER — ONDANSETRON HCL 4 MG PO TABS: 4.0000 mg | ORAL_TABLET | Freq: Four times a day (QID) | ORAL | Status: DC | PRN

## 2023-07-30 MED ORDER — HYDROMORPHONE HCL 1 MG/ML IJ SOLN
INTRAMUSCULAR | Status: AC
  Filled 2023-07-30: qty 0.5

## 2023-07-30 MED ORDER — SODIUM CHLORIDE 0.9% FLUSH: 3.0000 mL | INTRAVENOUS | Status: DC | PRN

## 2023-07-30 MED ORDER — EPHEDRINE SULFATE-NACL 50-0.9 MG/10ML-% IV SOSY
PREFILLED_SYRINGE | INTRAVENOUS | Status: DC | PRN
  Administered 2023-07-30: 5 mg via INTRAVENOUS

## 2023-07-30 MED ORDER — HYDROCHLOROTHIAZIDE 25 MG PO TABS
25.0000 mg | ORAL_TABLET | Freq: Every day | ORAL | Status: DC
  Administered 2023-07-30 – 2023-08-01 (×3): 25 mg via ORAL
  Filled 2023-07-30 (×3): qty 1

## 2023-07-30 MED ORDER — PROPOFOL 10 MG/ML IV BOLUS
INTRAVENOUS | Status: DC | PRN
  Administered 2023-07-30: 120 mg via INTRAVENOUS
  Administered 2023-07-30: 80 mg via INTRAVENOUS

## 2023-07-30 MED ORDER — PHENYLEPHRINE HCL-NACL 20-0.9 MG/250ML-% IV SOLN
INTRAVENOUS | Status: DC | PRN
  Administered 2023-07-30: 50 ug/min via INTRAVENOUS

## 2023-07-30 MED ORDER — UMECLIDINIUM-VILANTEROL 62.5-25 MCG/ACT IN AEPB
1.0000 | INHALATION_SPRAY | Freq: Every day | RESPIRATORY_TRACT | Status: DC
  Administered 2023-07-31 – 2023-08-01 (×2): 1 via RESPIRATORY_TRACT
  Filled 2023-07-30: qty 14

## 2023-07-30 MED ORDER — THROMBIN 5000 UNITS EX SOLR
CUTANEOUS | Status: AC
  Filled 2023-07-30: qty 10000

## 2023-07-30 MED ORDER — ONDANSETRON HCL 4 MG/2ML IJ SOLN
INTRAMUSCULAR | Status: AC
  Filled 2023-07-30: qty 2

## 2023-07-30 MED ORDER — AMMONIUM LACTATE 12 % EX LOTN: 1.0000 | TOPICAL_LOTION | Freq: Two times a day (BID) | CUTANEOUS | Status: DC | PRN

## 2023-07-30 MED ORDER — LIDOCAINE-EPINEPHRINE 0.5 %-1:200000 IJ SOLN
INTRAMUSCULAR | Status: DC | PRN
  Administered 2023-07-30: 10 mL
  Administered 2023-07-30: 20 mL

## 2023-07-30 MED ORDER — KETAMINE HCL 50 MG/5ML IJ SOSY
PREFILLED_SYRINGE | INTRAMUSCULAR | Status: AC
  Filled 2023-07-30: qty 5

## 2023-07-30 MED ORDER — ONDANSETRON HCL 4 MG PO TABS: 4.0000 mg | ORAL_TABLET | Freq: Three times a day (TID) | ORAL | Status: DC | PRN

## 2023-07-30 MED ORDER — ATORVASTATIN CALCIUM 10 MG PO TABS
20.0000 mg | ORAL_TABLET | Freq: Every day | ORAL | Status: DC
  Administered 2023-07-30 – 2023-07-31 (×2): 20 mg via ORAL
  Filled 2023-07-30 (×2): qty 2

## 2023-07-30 MED ORDER — DEXAMETHASONE SODIUM PHOSPHATE 10 MG/ML IJ SOLN
INTRAMUSCULAR | Status: DC | PRN
  Administered 2023-07-30: 10 mg via INTRAVENOUS

## 2023-07-30 MED ORDER — ACETAMINOPHEN 10 MG/ML IV SOLN
INTRAVENOUS | Status: DC | PRN
Start: 2023-07-30 — End: 2023-07-30
  Administered 2023-07-30: 1000 mg via INTRAVENOUS

## 2023-07-30 MED ORDER — CHLORHEXIDINE GLUCONATE 0.12 % MT SOLN
OROMUCOSAL | Status: AC
  Administered 2023-07-30: 15 mL via OROMUCOSAL
  Filled 2023-07-30: qty 15

## 2023-07-30 MED ORDER — MORPHINE SULFATE (PF) 2 MG/ML IV SOLN
2.0000 mg | INTRAVENOUS | Status: DC | PRN
  Administered 2023-07-30: 2 mg via INTRAVENOUS
  Filled 2023-07-30: qty 1

## 2023-07-30 MED ORDER — ACETAMINOPHEN 650 MG RE SUPP: 650.0000 mg | RECTAL | Status: DC | PRN

## 2023-07-30 MED ORDER — FENTANYL CITRATE (PF) 100 MCG/2ML IJ SOLN
INTRAMUSCULAR | Status: AC
  Filled 2023-07-30: qty 2

## 2023-07-30 MED ORDER — OXYCODONE HCL 5 MG PO TABS: 5.0000 mg | ORAL_TABLET | ORAL | Status: DC | PRN

## 2023-07-30 MED ORDER — POLYPODIUM LEUCOTOMOS 240 MG PO CAPS: 480.0000 mg | ORAL_CAPSULE | Freq: Every day | ORAL | Status: DC

## 2023-07-30 MED ORDER — MAGNESIUM CITRATE PO SOLN: 1.0000 | Freq: Once | ORAL | Status: DC | PRN

## 2023-07-30 MED ORDER — CEFAZOLIN SODIUM-DEXTROSE 2-4 GM/100ML-% IV SOLN
2.0000 g | INTRAVENOUS | Status: AC
  Administered 2023-07-30: 2 g via INTRAVENOUS

## 2023-07-30 MED ORDER — ONDANSETRON HCL 4 MG/2ML IJ SOLN
INTRAMUSCULAR | Status: DC | PRN
  Administered 2023-07-30: 4 mg via INTRAVENOUS

## 2023-07-30 SURGICAL SUPPLY — 49 items
ADH SKN CLS APL DERMABOND .7 (GAUZE/BANDAGES/DRESSINGS) ×1
APL SKNCLS STERI-STRIP NONHPOA (GAUZE/BANDAGES/DRESSINGS)
BAG COUNTER SPONGE SURGICOUNT (BAG) ×1 IMPLANT
BAG SPNG CNTER NS LX DISP (BAG) ×1
BENZOIN TINCTURE PRP APPL 2/3 (GAUZE/BANDAGES/DRESSINGS) IMPLANT
BLADE CLIPPER SURG (BLADE) IMPLANT
BUR MATCHSTICK NEURO 3.0 LAGG (BURR) ×1 IMPLANT
BUR PRECISION FLUTE 5.0 (BURR) IMPLANT
CANISTER SUCT 3000ML PPV (MISCELLANEOUS) ×1 IMPLANT
CLEANER TIP ELECTROSURG 2X2 (MISCELLANEOUS) IMPLANT
DERMABOND ADVANCED .7 DNX12 (GAUZE/BANDAGES/DRESSINGS) ×1 IMPLANT
DRAPE C-ARM 42X72 X-RAY (DRAPES) ×2 IMPLANT
DRAPE LAPAROTOMY 100X72X124 (DRAPES) ×1 IMPLANT
DRAPE MICROSCOPE SLANT 54X150 (MISCELLANEOUS) ×1 IMPLANT
DRAPE SURG 17X23 STRL (DRAPES) ×1 IMPLANT
DRSG OPSITE POSTOP 4X6 (GAUZE/BANDAGES/DRESSINGS) IMPLANT
DURAPREP 26ML APPLICATOR (WOUND CARE) ×1 IMPLANT
ELECT REM PT RETURN 9FT ADLT (ELECTROSURGICAL) ×1
ELECTRODE REM PT RTRN 9FT ADLT (ELECTROSURGICAL) ×1 IMPLANT
GAUZE 4X4 16PLY ~~LOC~~+RFID DBL (SPONGE) IMPLANT
GAUZE SPONGE 4X4 12PLY STRL (GAUZE/BANDAGES/DRESSINGS) IMPLANT
GLOVE ECLIPSE 6.5 STRL STRAW (GLOVE) ×1 IMPLANT
GLOVE EXAM NITRILE XL STR (GLOVE) IMPLANT
GOWN STRL REUS W/ TWL LRG LVL3 (GOWN DISPOSABLE) ×2 IMPLANT
GOWN STRL REUS W/ TWL XL LVL3 (GOWN DISPOSABLE) IMPLANT
GOWN STRL REUS W/TWL 2XL LVL3 (GOWN DISPOSABLE) IMPLANT
GOWN STRL REUS W/TWL LRG LVL3 (GOWN DISPOSABLE) ×1
GOWN STRL REUS W/TWL XL LVL3 (GOWN DISPOSABLE)
KIT BASIN OR (CUSTOM PROCEDURE TRAY) ×1 IMPLANT
KIT TURNOVER KIT B (KITS) ×1 IMPLANT
NDL HYPO 25X1 1.5 SAFETY (NEEDLE) ×1 IMPLANT
NDL SPNL 18GX3.5 QUINCKE PK (NEEDLE) IMPLANT
NEEDLE HYPO 25X1 1.5 SAFETY (NEEDLE) ×1
NEEDLE SPNL 18GX3.5 QUINCKE PK (NEEDLE)
NS IRRIG 1000ML POUR BTL (IV SOLUTION) ×1 IMPLANT
PACK LAMINECTOMY NEURO (CUSTOM PROCEDURE TRAY) ×1 IMPLANT
PAD ARMBOARD 7.5X6 YLW CONV (MISCELLANEOUS) ×3 IMPLANT
PATTIES SURGICAL 1X1 (DISPOSABLE) IMPLANT
SPIKE FLUID TRANSFER (MISCELLANEOUS) ×1 IMPLANT
SPONGE SURGIFOAM ABS GEL SZ50 (HEMOSTASIS) ×1 IMPLANT
SPONGE T-LAP 4X18 ~~LOC~~+RFID (SPONGE) IMPLANT
STRIP CLOSURE SKIN 1/2X4 (GAUZE/BANDAGES/DRESSINGS) IMPLANT
SUT VIC AB 0 CT1 18XCR BRD8 (SUTURE) ×1 IMPLANT
SUT VIC AB 0 CT1 8-18 (SUTURE) ×1
SUT VIC AB 2-0 CT1 18 (SUTURE) ×1 IMPLANT
SUT VIC AB 3-0 SH 8-18 (SUTURE) ×1 IMPLANT
TOWEL GREEN STERILE (TOWEL DISPOSABLE) ×1 IMPLANT
TOWEL GREEN STERILE FF (TOWEL DISPOSABLE) ×1 IMPLANT
WATER STERILE IRR 1000ML POUR (IV SOLUTION) ×1 IMPLANT

## 2023-07-30 NOTE — H&P (Signed)
BP (!) 123/57   Pulse (!) 58   Temp 97.9 F (36.6 C) (Oral)   Resp 17   Ht 5\' 9"  (1.753 m)   Wt 85.3 kg   SpO2 100%   BMI 27.76 kg/m   Alvin Benton is in a great deal of pain.  He clearly has lumbar stenosis present at 4-5 where he has a subacute compression fracture of L4.  It is also noted on his hospital discharge that he has congestive failure, that he had pleural effusions, and he would need clearance to undergo any kind of surgery.  He clearly also has osteopenia, as he has L4, L2 and an L5 compression fracture.  He would not be a candidate for hardware, as I am quite afraid that the hardware would simply pull through the bone.  But, he has received medical clearance.  Alvin Benton is in a wheelchair and is relying upon transportation for all of his appointments.  At this point, he says he does not have his own physician because he just cannot get there and he is sort of stuck in limbo.  He will need to see someone who will identify as his primary care physician before I could or would be willing to have him undergo any kind of surgery.  Followup in a hospital will not be sufficient and Alvin Benton truthfully and honestly states he is not sure he could make it for followup unless he was able to get a ride.  He says that is covered right now only because he is in a rehab facility.  But once out of the facility, he is not sure what he would be able to do.  We have many logistical questions to resolve before any kind of operation would be scheduled.     Reflexes are 3+ at the knees, 2+ at the ankles, 3+ at the biceps, triceps, and brachioradialis.  Pupils are equal, round, and reactive to light.  Full extraocular movements.  Full visual fields.  He is fluent.  Memory, language, and attention span appear to be normal.  He has had absolutely no problems with bowel or bladder function. Past Medical History:  Diagnosis Date   Atrial fibrillation (HCC) 06/2017   Blood in stool    CHF (congestive heart  failure) (HCC)    COPD (chronic obstructive pulmonary disease) (HCC)    Diverticulitis    ETOH abuse    GERD (gastroesophageal reflux disease)    Heart murmur    Hepatic steatosis    History of urinary tract infection    Hypertension    IBS (irritable bowel syndrome)    Migraine    "none in a long time" (03/14/2016)   Nocturia    Pneumonia    childhood   Seasonal allergies    SIADH (syndrome of inappropriate ADH production) (HCC)    Tobacco abuse    Past Surgical History:  Procedure Laterality Date   COLONOSCOPY     CYSTOSCOPY WITH STENT PLACEMENT Left 10/03/2016   Procedure: CYSTOSCOPY WITH  LEFT URETERAL CATHETER PLACEMENT;  Surgeon: Bjorn Pippin, MD;  Location: [facility];  Service: Urology;  Laterality: Left;   PROCTOSCOPY  10/03/2016   Procedure: PROCTOSCOPY;  Surgeon: Romie Levee, MD;  Location: [facility];  Service: General;;   RIGHT/LEFT HEART CATH AND CORONARY ANGIOGRAPHY N/A 07/22/2017   Procedure: RIGHT/LEFT HEART CATH AND CORONARY ANGIOGRAPHY;  Surgeon: Swazil[place], Peter M, MD;  Location: [facility] INVASIVE CV LAB;  Service: Cardiovascular;  Laterality: N/A;   TONSILLECTOMY  Family History  Problem Relation Age of Onset   Stroke Mother    Colon cancer Neg Hx    Esophageal cancer Neg Hx    Rectal cancer Neg Hx    Stomach cancer Neg Hx     Social History   Socioeconomic History   Marital status: Single    Spouse name: Not on file   Number of children: 1   Years of education: Not on file   Highest education level: Not on file  Occupational History   Occupation: Inventory     Employer: [employer]  Tobacco Use   Smoking status: Every Day    Current packs/day: 1.00    Average packs/day: 1 pack/day for 40.0 years (40.0 ttl pk-yrs)    Types: Cigarettes   Smokeless tobacco: Never  Vaping Use   Vaping status: Never Used  Substance and Sexual Activity   Alcohol use: Yes    Alcohol/week: 3.0 standard drinks of alcohol    Types: 3 Shots of liquor per week    Comment:  pt states does not drink daily has beer with football   Drug use: No   Sexual activity: Not Currently  Other Topics Concern   Not on file  Social History Narrative   Area and one daughter   Regulatory affairs officer at [employer]   One caffeinated beverage a day   Social Determinants of Health   Financial Resource Strain: Not on file  Food Insecurity: Not on file  Transportation Needs: Not on file  Physical Activity: Not on file  Stress: Not on file  Social Connections: Unknown (03/12/2022)   Received from [facility], [facility]   Social Network    Social Network: Not on file  Intimate Partner Violence: Unknown (02/01/2022)   Received from [facility]   HITS    Physically Hurt: Not on file    Insult or Talk Down To: Not on file    Threaten Physical Harm: Not on file    Scream or Curse: Not on file   No Known Allergies Prior to Admission medications   Medication Sig Start Date End Date Taking? Authorizing Provider  acetaminophen (TYLENOL) 325 MG tablet Take 650 mg by mouth every 4 (four) hours as needed for mild pain or moderate pain.   Yes [provider]  acetaminophen-caffeine (EXCEDRIN TENSION HEADACHE) 500-65 MG TABS per tablet Take 2 tablets by mouth daily.   Yes [provider]  albuterol (VENTOLIN HFA) 108 (90 Base) MCG/ACT inhaler Inhale 2 puffs into the lungs every 6 (six) hours as needed for wheezing or shortness of breath. 04/13/21  Yes [provider]  ALPRAZolam Prudy Feeler) 0.5 MG tablet Take 0.5 mg by mouth 2 (two) times daily. 06/24/23  Yes [provider]  amLODipine (NORVASC) 5 MG tablet Take 1 tablet (5 mg total) by mouth daily. 02/27/22 07/24/23 Yes Amponsah, Flossie Buffy, MD  ammonium lactate (LAC-HYDRIN) 12 % lotion Apply 1 Application topically every 12 (twelve) hours as needed (apply to bilat arms topically for brusing).   Yes [provider]  atorvastatin (LIPITOR) 20 MG tablet Take 20 mg by mouth at  bedtime. 06/06/23  Yes [provider]  carvedilol (COREG) 12.5 MG tablet Take 1 tablet (12.5 mg total) by mouth 2 (two) times daily with a meal. 02/26/22 07/24/23 Yes Amponsah, Flossie Buffy, MD  cholecalciferol (VITAMIN D3) 25 MCG (1000 UNIT) tablet Take 3,000 Units by mouth daily.   Yes [provider]  fexofenadine (ALLEGRA) 180  MG tablet Take 180 mg by mouth daily.   Yes [provider]  fluticasone (FLONASE) 50 MCG/ACT nasal spray Place 1 spray into both nostrils daily. 04/24/23  Yes [provider]  furosemide (LASIX) 40 MG tablet Take 1 tablet (40 mg total) by mouth daily. 02/26/22 07/24/23 Yes Gwenevere Abbot, MD  gabapentin (NEURONTIN) 300 MG capsule Take 300 mg by mouth 3 (three) times daily. 06/19/23  Yes [provider]  hydrochlorothiazide (HYDRODIURIL) 25 MG tablet Take 1 tablet (25 mg total) by mouth daily. 02/26/22 07/24/23 Yes Amponsah, Flossie Buffy, MD  lidocaine (LIDODERM) 5 % Place 1 patch onto the skin every 12 (twelve) hours as needed (pain). Remove & Discard patch within 12 hours or as directed by MD   Yes [provider]  losartan (COZAAR) 25 MG tablet Take 25 mg by mouth every evening. 07/10/23  Yes [provider]  Multiple Vitamins-Minerals (MULTIVITAMIN WITH MINERALS) tablet Take 1 tablet by mouth daily.   Yes [provider]  ondansetron (ZOFRAN) 4 MG tablet Take 4 mg by mouth every 8 (eight) hours as needed for nausea or vomiting.   Yes [provider]  polyethylene glycol (MIRALAX / GLYCOLAX) 17 g packet Take 17 g by mouth daily. In liquid   Yes [provider]  Polypodium Leucotomos 240 MG CAPS Take 480 mg by mouth daily.   Yes [provider]  Potassium Chloride ER 20 MEQ TBCR Take 20 mEq by mouth daily. 07/18/23  Yes [provider]  sertraline (ZOLOFT) 25 MG tablet Take 25 mg by mouth daily. 07/18/23  Yes [provider]  SUNSCREENS EX Apply 1 application  topically See admin  instructions. Apply to resident exposed skin before going outside in the sun   Yes [provider]  trolamine salicylate (ASPERCREME) 10 % cream Apply 1 Application topically every 6 (six) hours as needed for muscle pain (Apply to lower back for amall amount to cover area).   Yes [provider]  umeclidinium-vilanterol (ANORO ELLIPTA) 62.5-25 MCG/ACT AEPB Inhale 1 puff into the lungs daily. 02/27/22  Yes Steffanie Rainwater, MD   OR for lumbar decompression Alvin Benton now has obtained Medicaid.  He also had the MRI, which shows the heel compression fractures at L2, L4, and L5.  He has fairly profound stenosis at L3-4 and at L4-5, and I think he just needs a posterior decompression.  I would try to do it by semi-hemilaminectomies on both the right and left and use a spinous process plate, if possible, just to preserve enough bone because pedicle screws could not be used secondary to the lack, essentially, of a good sized pedicle at L4 or at L5.  He is alert, oriented x4, in the wheelchair, moving around.  Strength is good.

## 2023-07-30 NOTE — Anesthesia Postprocedure Evaluation (Signed)
Anesthesia Post Note  Patient: Macon Large  Procedure(s) Performed: Sublaminar decompression,  Lumbar three-Lumbar four - Lumbar four-Lumbar five - bilateral (Bilateral)     Patient location during evaluation: PACU Anesthesia Type: General Level of consciousness: awake and alert Pain management: pain level controlled Vital Signs Assessment: post-procedure vital signs reviewed and stable Respiratory status: spontaneous breathing, nonlabored ventilation, respiratory function stable and patient connected to nasal cannula oxygen Cardiovascular status: blood pressure returned to baseline and stable Postop Assessment: no apparent nausea or vomiting Anesthetic complications: no   No notable events documented.  Last Vitals:  Vitals:   07/30/23 1645 07/30/23 1655  BP: (!) 105/58 111/63  Pulse: 62 (!) 57  Resp: 15 14  Temp:  36.6 C  SpO2: 96% 97%    Last Pain:  Vitals:   07/30/23 1645  TempSrc:   PainSc: Asleep                 Mariann Barter

## 2023-07-30 NOTE — Anesthesia Procedure Notes (Signed)
Procedure Name: Intubation Date/Time: 07/30/2023 12:28 PM  Performed by: Susy Manor, CRNAPre-anesthesia Checklist: Patient identified, Emergency Drugs available, Suction available and Patient being monitored Patient Re-evaluated:Patient Re-evaluated prior to induction Oxygen Delivery Method: Circle System Utilized Preoxygenation: Pre-oxygenation with 100% oxygen Induction Type: IV induction Ventilation: Mask ventilation without difficulty Laryngoscope Size: Mac and 4 Grade View: Grade III Tube type: Oral Tube size: 7.5 mm Number of attempts: 1 Airway Equipment and Method: Stylet and Oral airway Placement Confirmation: ETT inserted through vocal cords under direct vision, positive ETCO2 and breath sounds checked- equal and bilateral Tube secured with: Tape Dental Injury: Teeth and Oropharynx as per pre-operative assessment

## 2023-07-30 NOTE — Transfer of Care (Signed)
Immediate Anesthesia Transfer of Care Note  Patient: Alvin Benton  Procedure(s) Performed: Sublaminar decompression,  Lumbar three-Lumbar four - Lumbar four-Lumbar five - bilateral (Bilateral)  Patient Location: PACU  Anesthesia Type:General  Level of Consciousness: awake  Airway & Oxygen Therapy: Patient Spontanous Breathing and Patient connected to nasal cannula oxygen  Post-op Assessment: Report given to RN  Post vital signs: Reviewed and stable  Last Vitals:  Vitals Value Taken Time  BP 119/60 07/30/23 1545  Temp    Pulse 68 07/30/23 1547  Resp 19 07/30/23 1547  SpO2 99 % 07/30/23 1547  Vitals shown include unfiled device data.  Last Pain:  Vitals:   07/30/23 0932  TempSrc:   PainSc: 7       Patients Stated Pain Goal: 0 (07/30/23 0932)  Complications: No notable events documented.

## 2023-07-31 ENCOUNTER — Encounter (HOSPITAL_COMMUNITY): Payer: Self-pay | Admitting: Neurosurgery

## 2023-07-31 MED FILL — Thrombin For Soln 5000 Unit: CUTANEOUS | Qty: 2 | Status: AC

## 2023-07-31 NOTE — Plan of Care (Signed)
  Problem: Education: Goal: Knowledge of General Education information will improve Description: Including pain rating scale, medication(s)/side effects and non-pharmacologic comfort measures Outcome: Progressing   Problem: Health Behavior/Discharge Planning: Goal: Ability to manage health-related needs will improve Outcome: Not Progressing   Problem: Clinical Measurements: Goal: Ability to maintain clinical measurements within normal limits will improve Outcome: Progressing Goal: Will remain free from infection Outcome: Progressing Goal: Diagnostic test results will improve Outcome: Progressing Goal: Respiratory complications will improve Outcome: Progressing Goal: Cardiovascular complication will be avoided Outcome: Progressing   Problem: Activity: Goal: Risk for activity intolerance will decrease Outcome: Progressing   Problem: Nutrition: Goal: Adequate nutrition will be maintained Outcome: Progressing   Problem: Coping: Goal: Level of anxiety will decrease Outcome: Progressing   Problem: Elimination: Goal: Will not experience complications related to bowel motility Outcome: Progressing Goal: Will not experience complications related to urinary retention Outcome: Progressing   Problem: Safety: Goal: Ability to remain free from injury will improve Outcome: Progressing   Problem: Skin Integrity: Goal: Risk for impaired skin integrity will decrease Outcome: Progressing   Problem: Pain Managment: Goal: General experience of comfort will improve Outcome: Progressing Goal: Ability to tolerate increased activity will improve Outcome: Progressing Goal: Will remain free from falls Outcome: Progressing   Problem: Bowel/Gastric: Goal: Gastrointestinal status for postoperative course will improve Outcome: Progressing   Problem: Clinical Measurements: Goal: Ability to maintain clinical measurements within normal limits will improve Outcome: Progressing Goal:  Postoperative complications will be avoided or minimized Outcome: Progressing Goal: Diagnostic test results will improve Outcome: Progressing

## 2023-07-31 NOTE — TOC Initial Note (Signed)
Transition of Care [facility]) - Initial/Assessment Note    Patient Details  Name: Alvin Benton MRN: [id number] Date of Birth: [birth year]  [facility]) CM/SW Contact:    Lorri Frederick, LCSW Phone Number: 07/31/2023, 3:30 PM  Clinical Narrative:   CSW met with pt for initial assessment.  Pt confirms he has been in long term care at [facility] awaiting this surgery.  He does want to return there at [place] for rehab and hopes to be able to eventually get back home.  Permission given to speak with brother Tasia Catchings and friend Nadine Counts.    Referral sent to [facility] in hub.                 Expected Discharge Plan: Skilled Nursing Facility Barriers to Discharge: Continued Medical Work up, SNF Pending bed offer   Patient Goals and CMS Choice Patient states their goals for this hospitalization and ongoing recovery are:: walk again   Choice offered to / list presented to : Patient (wants to return to [facility])      Expected Discharge Plan and Services In-house Referral: Clinical Social Work   Post Acute Care Choice: Skilled Nursing Facility Living arrangements for the past 2 months: Skilled Nursing Facility ([facility])                                      Prior Living Arrangements/Services Living arrangements for the past 2 months: Skilled Nursing Facility ([facility]) Lives with:: Facility Resident Patient language and need for interpreter reviewed:: Yes Do you feel safe going back to the place where you live?: Yes      Need for Family Participation in Patient Care: No (Comment) Care giver support system in place?: Yes (comment) Current home services: Other (comment) (na) Criminal Activity/Legal Involvement Pertinent to Current Situation/Hospitalization: No - Comment as needed  Activities of Daily Living   ADL Screening (condition at time of admission) Independently performs ADLs?: Yes (appropriate for developmental age) Is the patient deaf or have  difficulty hearing?: Yes Does the patient have difficulty seeing, even when wearing glasses/contacts?: No Does the patient have difficulty concentrating, remembering, or making decisions?: No  Permission Sought/Granted Permission sought to share information with : Family Supports Permission granted to share information with : Yes, Verbal Permission Granted  Share Information with NAME: brother Tasia Catchings, friend Nadine Counts  Permission granted to share info w AGENCY: [facility]        Emotional Assessment Appearance:: Appears stated age Attitude/Demeanor/Rapport: Engaged Affect (typically observed): Appropriate, Pleasant Orientation: : Oriented to Self, Oriented to Place, Oriented to  Time, Oriented to Situation      Admission diagnosis:  Lumbar stenosis with neurogenic claudication [M48.062] Patient Active Problem List   Diagnosis Date Noted   Lumbar stenosis with neurogenic claudication 07/30/2023   Acute exacerbation of congestive heart failure (HCC) 02/22/2022   Hyponatremia 05/26/2021   COPD (chronic obstructive pulmonary disease) (HCC) 05/26/2021   Chronic back pain greater than 3 months duration 11/21/2020   Elevated troponin    Nausea & vomiting 07/21/2017   Alcoholic ketoacidosis 07/21/2017   Dehydration 05/20/2017   Near syncope 02/26/2017   History of atrial fibrillation 02/26/2017   Right flank hematoma 02/26/2017   Zygomatic fracture, right side, initial encounter for closed fracture (HCC) 02/26/2017   Dizziness    Fall    Dehydration with hyponatremia 02/20/2017   Volume depletion  06/04/2016   Diverticulitis of intestine with perforation without bleeding 06/04/2016   Essential hypertension    Hypomagnesemia    Systolic murmur 05/05/2016   Anxiety state 04/19/2016   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   Atrial fibrillation (HCC) 04/05/2016   Diverticulitis of large intestine with perforation and abscess 03/14/2016   Chronic constipation 03/08/2016   Alcohol abuse  02/15/2016   Tobacco use disorder 07/17/2015   Allergic rhinitis 07/17/2015   Reactive airway disease with wheezing 07/17/2015   Overweight (BMI 25.0-29.9) 07/17/2015   Hand pain, left 01/12/2014   LLQ pain-chronic, intermittent and episodic 09/17/2013   PCP:  Patient, No Pcp Per Pharmacy:  No Pharmacies Listed    Social Determinants of Health (SDOH) Social History: SDOH Screenings   Food Insecurity: No Food Insecurity (07/30/2023)  Housing: High Risk (07/30/2023)  Transportation Needs: Unmet Transportation Needs (07/30/2023)  Utilities: Not At Risk (07/30/2023)  Social Connections: Unknown (03/12/2022)   Received from [facility], [facility]  Tobacco Use: High Risk (07/30/2023)   SDOH Interventions:     Readmission Risk Interventions     No data to display

## 2023-07-31 NOTE — Plan of Care (Signed)

## 2023-07-31 NOTE — Evaluation (Signed)
Occupational Therapy Evaluation Patient Details Name: Alvin Benton MRN: [id number] DOB: 1951-05-26 Today's Date: 07/31/2023   History of Present Illness Pt is 72 yo presenting for posterior decompression at L3-4 and L 4-5. Semi hemilaminectomies performed on R /L. PMH: A fib, CHF, COPD, diverticulitis, GERD, heart murmur, hepatic steatosis, HTN, IBS, migraine, nocturia, SIADH.   Clinical Impression   This 72 yo male admitted with above presents to acute OT with PLOF of being Mod I/Independent with all basic ADLs from a wheelchair standpoint and ambulating ~50-80 feet with RW before pain was so bad had to sit down. Currently after back surgery he is setup-total A for basic ADLs, and Mod A +1-2 for all mobility. He will continue to benefit from acute OT with follow up continued inpatient follow up therapy, <3 hours/day.        If plan is discharge home, recommend the following: Two people to help with walking and/or transfers;A lot of help with bathing/dressing/bathroom;Assistance with cooking/housework;Help with stairs or ramp for entrance;Assist for transportation    Functional Status Assessment  Patient has had a recent decline in their functional status and demonstrates the ability to make significant improvements in function in a reasonable and predictable amount of time.  Equipment Recommendations  Other (comment) (TBD next venue)       Precautions / Restrictions Precautions Precautions: Back Precaution Booklet Issued: No Precaution Comments: No brace per orders Restrictions Weight Bearing Restrictions: No      Mobility Bed Mobility Overal bed mobility: Needs Assistance Bed Mobility: Rolling, Sidelying to Sit Rolling: Mod assist Sidelying to sit: Mod assist       General bed mobility comments: Sequential cues    Transfers Overall transfer level: Needs assistance Equipment used: Rolling walker (2 wheels) Transfers: Sit to/from Stand Sit to Stand: Mod assist, +2  physical assistance, From elevated surface                  Balance Overall balance assessment: Needs assistance Sitting-balance support: Bilateral upper extremity supported, Feet supported Sitting balance-Leahy Scale: Poor     Standing balance support: Bilateral upper extremity supported, Reliant on assistive device for balance Standing balance-Leahy Scale: Poor                             ADL either performed or assessed with clinical judgement   ADL Overall ADL's : Needs assistance/impaired Eating/Feeding: Independent;Sitting   Grooming: Set up;Sitting   Upper Body Bathing: Set up;Sitting   Lower Body Bathing: Maximal assistance Lower Body Bathing Details (indicate cue type and reason): Mod A +2 sit<>stand from raised bed Upper Body Dressing : Set up;Sitting   Lower Body Dressing: Total assistance Lower Body Dressing Details (indicate cue type and reason): Mod A +2 sit<>stand from raised bed Toilet Transfer: Moderate assistance;+2 for physical assistance;+2 for safety/equipment;Ambulation;Rolling walker (2 wheels) Toilet Transfer Details (indicate cue type and reason): simulated bed>10 steps >sit in recliner behind him Toileting- Architect and Hygiene: Total assistance Toileting - Clothing Manipulation Details (indicate cue type and reason): Mod A +2 sit<>stand from raised bed             Vision Baseline Vision/History: 1 Wears glasses Patient Visual Report: No change from baseline              Pertinent Vitals/Pain Pain Assessment Pain Assessment: 0-10 Pain Score: 6  Pain Location: Incision pain Pain Descriptors / Indicators: Aching, Sore, Spasm Pain Intervention(s): Limited activity  within patient's tolerance, Monitored during session, Premedicated before session, Repositioned     Extremity/Trunk Assessment Upper Extremity Assessment Upper Extremity Assessment: Overall WFL for tasks assessed           Communication  Communication Communication: No apparent difficulties Cueing Techniques: Verbal cues;Tactile cues;Visual cues   Cognition Arousal: Alert Behavior During Therapy: WFL for tasks assessed/performed Overall Cognitive Status: Within Functional Limits for tasks assessed                                                  Home Living Family/patient expects to be discharged to:: Skilled nursing facility                                 Additional Comments: Pt reports that he has been living at [facility]. Pt states that he was previously getting rehab but he was plateaued. He states prior to surgery he would make small gains in surgery.      Prior Functioning/Environment Prior Level of Function : Independent/Modified Independent             Mobility Comments: Pt states that he used the RW as long as possible but then started to use the W/C. Pt states in the past couple weeks he was able to ambulate about 80 feet at the most. Pt was Mod I with transfers, bed mobility ADLs Comments: Pt states he was Mod I at W/C level.        OT Problem List: Decreased strength;Decreased range of motion;Decreased activity tolerance;Impaired balance (sitting and/or standing);Decreased safety awareness;Decreased knowledge of precautions;Pain      OT Treatment/Interventions: Self-care/ADL training;DME and/or AE instruction;Balance training;Patient/family education    OT Goals(Current goals can be found in the care plan section) Acute Rehab OT Goals Patient Stated Goal: to go back to [facility] for rehab and be able to walk again on my own OT Goal Formulation: With patient Time For Goal Achievement: 08/14/23 Potential to Achieve Goals: Good  OT Frequency: Min 1X/week       AM-PAC OT "6 Clicks" Daily Activity     Outcome Measure Help from another person eating meals?: None Help from another person taking care of personal grooming?: A  Little Help from another person toileting, which includes using toliet, bedpan, or urinal?: A Lot Help from another person bathing (including washing, rinsing, drying)?: A Lot Help from another person to put on and taking off regular upper body clothing?: A Little Help from another person to put on and taking off regular lower body clothing?: Total 6 Click Score: 15   End of Session Equipment Utilized During Treatment: Gait belt;Rolling walker (2 wheels) Nurse Communication: Mobility status  Activity Tolerance: Patient limited by pain Patient left: in chair;with call bell/phone within reach;with chair alarm set  OT Visit Diagnosis: Unsteadiness on feet (R26.81);Other abnormalities of gait and mobility (R26.89);Muscle weakness (generalized) (M62.81);Pain Pain - part of body:  (incisional)                Time: 0981-1914 OT Time Calculation (min): 20 min Charges:  OT General Charges $OT Visit: 1 Visit OT Evaluation $OT Eval Moderate Complexity: 1 Mod  Cathy L. OT Acute Rehabilitation Services Office [phone]    Evette Georges 07/31/2023, 11:03 AM

## 2023-07-31 NOTE — NC FL2 (Signed)
[place] MEDICAID FL2 LEVEL OF CARE FORM     IDENTIFICATION  Patient Name: Alvin Benton Birthdate: 1951-05-16 Sex: male Admission Date (Current Location): 07/30/2023  [place] and [place] Number:  Haynes Bast [id number] T Facility and Address:  [facility] [place] [facility], [address] [place], [place] [address]      Provider Number: [id number]  Attending Physician Name and Address:  Coletta Memos, MD  Relative Name and Phone Number:  ROCHELL, MABIE [phone]  [phone]    Current Level of Care: Hospital Recommended Level of Care: Skilled Nursing Facility Prior Approval Number:    Date Approved/Denied:   PASRR Number: [id number]  Discharge Plan: SNF    Current Diagnoses: Patient Active Problem List   Diagnosis Date Noted   Lumbar stenosis with neurogenic claudication 07/30/2023   Acute exacerbation of congestive heart failure (HCC) 02/22/2022   Hyponatremia 05/26/2021   COPD (chronic obstructive pulmonary disease) (HCC) 05/26/2021   Chronic back pain greater than 3 months duration 11/21/2020   Elevated troponin    Nausea & vomiting 07/21/2017   Alcoholic ketoacidosis 07/21/2017   Dehydration 05/20/2017   Near syncope 02/26/2017   History of atrial fibrillation 02/26/2017   Right flank hematoma 02/26/2017   Zygomatic fracture, right side, initial encounter for closed fracture (HCC) 02/26/2017   Dizziness    Fall    Dehydration with hyponatremia 02/20/2017   Volume depletion 06/04/2016   Diverticulitis of intestine with perforation without bleeding 06/04/2016   Essential hypertension    Hypomagnesemia    Systolic murmur 05/05/2016   Anxiety state 04/19/2016   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   Atrial fibrillation (HCC) 04/05/2016   Diverticulitis of large intestine with perforation and abscess 03/14/2016   Chronic constipation 03/08/2016   Alcohol abuse 02/15/2016   Tobacco use disorder 07/17/2015   Allergic rhinitis 07/17/2015    Reactive airway disease with wheezing 07/17/2015   Overweight (BMI 25.0-29.9) 07/17/2015   Hand pain, left 01/12/2014   LLQ pain-chronic, intermittent and episodic 09/17/2013    Orientation RESPIRATION BLADDER Height & Weight     Self, Time, Situation, Place  Normal Continent Weight: 188 lb (85.3 kg) Height:  5\' 9"  (175.3 cm)  BEHAVIORAL SYMPTOMS/MOOD NEUROLOGICAL BOWEL NUTRITION STATUS      Continent Diet (see discharge summary)  AMBULATORY STATUS COMMUNICATION OF NEEDS Skin   Limited Assist Verbally Skin abrasions, Surgical wounds                       Personal Care Assistance Level of Assistance  Bathing, Feeding, Dressing, Total care Bathing Assistance: Maximum assistance Feeding assistance: Independent Dressing Assistance: Maximum assistance Total Care Assistance: Maximum assistance   Functional Limitations Info  Sight, Hearing, Speech Sight Info: Adequate Hearing Info: Adequate Speech Info: Adequate    SPECIAL CARE FACTORS FREQUENCY  PT (By licensed PT), OT (By licensed OT)     PT Frequency: 5x week OT Frequency: 5x week            Contractures Contractures Info: Not present    Additional Factors Info  Code Status, Allergies Code Status Info: full Allergies Info: NKA           Current Medications (07/31/2023):  This is the current hospital active medication list Current Facility-Administered Medications  Medication Dose Route Frequency Provider Last Rate Last Admin   0.9 %  sodium chloride infusion  250 mL Intravenous Continuous Coletta Memos, MD 1 mL/hr at 07/30/23 1809 250 mL at 07/30/23 1809   0.9 %  NaCl with KCl 20 mEq/ L  infusion   Intravenous Continuous Coletta Memos, MD 80 mL/hr at 07/31/23 0754 New Bag at 07/31/23 0754   acetaminophen (TYLENOL) tablet 650 mg  650 mg Oral Q4H PRN Coletta Memos, MD       Or   acetaminophen (TYLENOL) suppository 650 mg  650 mg Rectal Q4H PRN Coletta Memos, MD       acetaminophen (TYLENOL) tablet 1,000 mg   1,000 mg Oral Q6H Coletta Memos, MD   1,000 mg at 07/31/23 1315   albuterol (PROVENTIL) (2.5 MG/3ML) 0.083% nebulizer solution 2.5 mg  2.5 mg Nebulization Q6H PRN Calton Dach I, RPH       ALPRAZolam Prudy Feeler) tablet 0.5 mg  0.5 mg Oral BID Coletta Memos, MD   0.5 mg at 07/31/23 0956   amLODipine (NORVASC) tablet 5 mg  5 mg Oral Daily Coletta Memos, MD   5 mg at 07/31/23 0957   ammonium lactate (LAC-HYDRIN) 12 % lotion 1 Application  1 Application Topical Q12H PRN Coletta Memos, MD       atorvastatin (LIPITOR) tablet 20 mg  20 mg Oral QHS Coletta Memos, MD   20 mg at 07/30/23 2220   bisacodyl (DULCOLAX) EC tablet 5 mg  5 mg Oral Daily PRN Coletta Memos, MD       carvedilol (COREG) tablet 12.5 mg  12.5 mg Oral BID WC Coletta Memos, MD   12.5 mg at 07/31/23 0957   cholecalciferol (VITAMIN D3) 25 MCG (1000 UNIT) tablet 3,000 Units  3,000 Units Oral Daily Coletta Memos, MD   3,000 Units at 07/31/23 0956   diazepam (VALIUM) tablet 5 mg  5 mg Oral Q6H PRN Coletta Memos, MD       fluticasone (FLONASE) 50 MCG/ACT nasal spray 1 spray  1 spray Each Nare Daily Coletta Memos, MD       furosemide (LASIX) tablet 40 mg  40 mg Oral Daily Coletta Memos, MD   40 mg at 07/31/23 1610   gabapentin (NEURONTIN) capsule 300 mg  300 mg Oral TID Coletta Memos, MD   300 mg at 07/31/23 9604   hydrochlorothiazide (HYDRODIURIL) tablet 25 mg  25 mg Oral Daily Coletta Memos, MD   25 mg at 07/31/23 0957   loratadine (CLARITIN) tablet 10 mg  10 mg Oral Daily Coletta Memos, MD   10 mg at 07/31/23 0956   losartan (COZAAR) tablet 25 mg  25 mg Oral QPM Coletta Memos, MD   25 mg at 07/30/23 1743   magnesium citrate solution 1 Bottle  1 Bottle Oral Once PRN Coletta Memos, MD       menthol-cetylpyridinium (CEPACOL) lozenge 3 mg  1 lozenge Oral PRN Coletta Memos, MD       Or   phenol (CHLORASEPTIC) mouth spray 1 spray  1 spray Mouth/Throat PRN Coletta Memos, MD       morphine (PF) 2 MG/ML injection 2 mg  2 mg Intravenous Q2H PRN  Coletta Memos, MD   2 mg at 07/30/23 1737   multivitamin with minerals tablet 1 tablet  1 tablet Oral Daily Coletta Memos, MD   1 tablet at 07/31/23 0956   ondansetron (ZOFRAN) tablet 4 mg  4 mg Oral Q6H PRN Coletta Memos, MD       Or   ondansetron (ZOFRAN) injection 4 mg  4 mg Intravenous Q6H PRN Coletta Memos, MD       oxyCODONE (Oxy IR/ROXICODONE) immediate release tablet 10 mg  10 mg Oral Q3H PRN Cabbell,  Ronaldo Miyamoto, MD   10 mg at 07/31/23 1315   oxyCODONE (Oxy IR/ROXICODONE) immediate release tablet 5 mg  5 mg Oral Q3H PRN Coletta Memos, MD       oxyCODONE (OXYCONTIN) 12 hr tablet 10 mg  10 mg Oral Q12H Coletta Memos, MD   10 mg at 07/31/23 0956   polyethylene glycol (MIRALAX / GLYCOLAX) packet 17 g  17 g Oral Daily Coletta Memos, MD   17 g at 07/31/23 0956   potassium chloride (KLOR-CON M) CR tablet 20 mEq  20 mEq Oral Daily Coletta Memos, MD   20 mEq at 07/31/23 0956   sertraline (ZOLOFT) tablet 25 mg  25 mg Oral Daily Coletta Memos, MD   25 mg at 07/31/23 0956   sodium chloride flush (NS) 0.9 % injection 3 mL  3 mL Intravenous Q12H Coletta Memos, MD   3 mL at 07/30/23 2221   sodium chloride flush (NS) 0.9 % injection 3 mL  3 mL Intravenous PRN Coletta Memos, MD       umeclidinium-vilanterol (ANORO ELLIPTA) 62.5-25 MCG/ACT 1 puff  1 puff Inhalation Daily Coletta Memos, MD   1 puff at 07/31/23 4098   Facility-Administered Medications Ordered in Other Encounters  Medication Dose Route Frequency Provider Last Rate Last Admin   lactated ringers infusion    Continuous PRN Epimenio Sarin, CRNA   Stopped at 08/29/16 [phone]     Discharge Medications: Please see discharge summary for a list of discharge medications.  Relevant Imaging Results:  Relevant Lab Results:   Additional Information SSN# [id number]  Lorri Frederick, LCSW

## 2023-07-31 NOTE — Evaluation (Signed)
Physical Therapy Evaluation Patient Details Name: Alvin Benton MRN: [id number] DOB: Jun 16, 1951 Today's Date: 07/31/2023  History of Present Illness  Pt is 72 yo presenting for posterior decompression at L3-4 and L 4-5. Semi hemilaminectomies performed on R /L. PMH: A fib, CHF, COPD, diverticulitis, GERD, heart murmur, hepatic steatosis, HTN, IBS, migraine, nocturia, SIADH.  Clinical Impression  Pt is presenting below baseline level of functioning. Prior to surgery pt was Mod I with all functional mobility. Currently pt is Mod A +2 making him Total A for sit to stand, very short gait and transfers. Pt is Mod A for bed mobility. Due to pt current functional status, home set up and available assistance at home recommending skilled physical therapy services < 3 hours/day on discharge from acute care hospital setting in order to decrease risk for falls, injury, immobility and re-hospitalization. Pt was limited by pain and fatigue this session.         If plan is discharge home, recommend the following: Two people to help with walking and/or transfers;Assist for transportation;Help with stairs or ramp for entrance;Assistance with cooking/housework   Can travel by private vehicle   No    Equipment Recommendations None recommended by PT     Functional Status Assessment Patient has had a recent decline in their functional status and demonstrates the ability to make significant improvements in function in a reasonable and predictable amount of time.     Precautions / Restrictions Precautions Precautions: Back Precaution Booklet Issued: No Precaution Comments: No brace per orders Restrictions Weight Bearing Restrictions: No      Mobility  Bed Mobility Overal bed mobility: Needs Assistance Bed Mobility: Rolling, Sidelying to Sit Rolling: Mod assist Sidelying to sit: Mod assist       General bed mobility comments: Sequential cues    Transfers Overall transfer level: Needs  assistance Equipment used: Rolling walker (2 wheels) Transfers: Sit to/from Stand Sit to Stand: Mod assist, +2 physical assistance, From elevated surface      Ambulation/Gait Ambulation/Gait assistance: Mod assist, +2 physical assistance, +2 safety/equipment Gait Distance (Feet): 6 Feet Assistive device: Rolling walker (2 wheels) Gait Pattern/deviations: Step-to pattern, Decreased step length - right, Decreased step length - left, Shuffle, Wide base of support, Decreased dorsiflexion - left, Decreased weight shift to left Gait velocity: Quick movements, slow progression Gait velocity interpretation: <1.31 ft/sec, indicative of household ambulator   General Gait Details: Chair follow, very erratic stepping pattern with quick movements      Balance Overall balance assessment: Needs assistance Sitting-balance support: Bilateral upper extremity supported, Feet supported Sitting balance-Leahy Scale: Poor     Standing balance support: Bilateral upper extremity supported, Reliant on assistive device for balance Standing balance-Leahy Scale: Poor         Pertinent Vitals/Pain Pain Assessment Pain Assessment: 0-10 Pain Score: 6  Pain Location: Incision pain Pain Descriptors / Indicators: Aching, Sore, Spasm Pain Intervention(s): Limited activity within patient's tolerance, Monitored during session, Premedicated before session, Repositioned    Home Living Family/patient expects to be discharged to:: Skilled nursing facility       Additional Comments: Pt reports that he has been living at [facility]. Pt states that he was previously getting rehab but he was plateaued. He states prior to surgery he would make small gains in surgery.    Prior Function Prior Level of Function : Independent/Modified Independent     Mobility Comments: Pt states that he used the RW as long as possible but then  started to use the W/C. Pt states in the past couple weeks he  was able to ambulate about 80 feet at the most. Pt was Mod I with transfers, bed mobility ADLs Comments: Pt states he was Mod I at W/C level.     Extremity/Trunk Assessment   Upper Extremity Assessment Upper Extremity Assessment: Defer to OT evaluation    Lower Extremity Assessment Lower Extremity Assessment: Overall WFL for tasks assessed    Cervical / Trunk Assessment Cervical / Trunk Assessment: Normal  Communication   Communication Communication: No apparent difficulties Cueing Techniques: Verbal cues;Tactile cues;Visual cues  Cognition Arousal: Alert Behavior During Therapy: WFL for tasks assessed/performed Overall Cognitive Status: Within Functional Limits for tasks assessed         General Comments General comments (skin integrity, edema, etc.): Incision covered, no noted drainage.        Assessment/Plan    PT Assessment Patient needs continued PT services  PT Problem List Decreased strength;Decreased mobility;Decreased safety awareness;Decreased activity tolerance       PT Treatment Interventions DME instruction;Therapeutic exercise;Gait training;Balance training;Neuromuscular re-education;Functional mobility training;Therapeutic activities;Patient/family education    PT Goals (Current goals can be found in the Care Plan section)  Acute Rehab PT Goals Patient Stated Goal: to walk PT Goal Formulation: With patient Time For Goal Achievement: 08/14/23 Potential to Achieve Goals: Good    Frequency Min 1X/week        AM-PAC PT "6 Clicks" Mobility  Outcome Measure Help needed turning from your back to your side while in a flat bed without using bedrails?: Total Help needed moving from lying on your back to sitting on the side of a flat bed without using bedrails?: Total Help needed moving to and from a bed to a chair (including a wheelchair)?: Total Help needed standing up from a chair using your arms (e.g., wheelchair or bedside chair)?: Total Help needed  to walk in hospital room?: Total Help needed climbing 3-5 steps with a railing? : Total 6 Click Score: 6    End of Session   Activity Tolerance: Patient tolerated treatment well;Patient limited by fatigue Patient left: in chair;with call bell/phone within reach;with chair alarm set Nurse Communication: Mobility status PT Visit Diagnosis: Other abnormalities of gait and mobility (R26.89)    Time: 1000-1030 PT Time Calculation (min) (ACUTE ONLY): 30 min   Charges:   PT Evaluation $PT Eval Low Complexity: 1 Low   PT General Charges $$ ACUTE PT VISIT: 1 Visit         Harrel Carina, DPT, CLT  Acute Rehabilitation Services Office: [phone] (Secure chat preferred)   Claudia Desanctis 07/31/2023, 12:50 PM

## 2023-08-01 MED ORDER — MUPIROCIN 2 % EX OINT
1.0000 | TOPICAL_OINTMENT | Freq: Two times a day (BID) | CUTANEOUS | Status: DC
  Administered 2023-08-01: 1 via NASAL
  Filled 2023-08-01: qty 22

## 2023-08-01 MED ORDER — TIZANIDINE HCL 4 MG PO TABS: 4.0000 mg | ORAL_TABLET | Freq: Four times a day (QID) | ORAL | 0 refills | Status: AC | PRN

## 2023-08-01 MED ORDER — OXYCODONE HCL 5 MG PO TABS: 5.0000 mg | ORAL_TABLET | Freq: Four times a day (QID) | ORAL | 0 refills | Status: AC | PRN

## 2023-08-01 MED ORDER — CHLORHEXIDINE GLUCONATE CLOTH 2 % EX PADS: 6.0000 | MEDICATED_PAD | Freq: Every day | CUTANEOUS | Status: DC

## 2023-08-01 NOTE — Care Management Important Message (Signed)
Important Message  Patient Details  Name: DIERRE CREVIER MRN: [id number] Date of Birth: 09-05-1951   Important Message Given:  Yes - Medicare IM     Sherilyn Banker 08/01/2023, 12:07 PM

## 2023-08-01 NOTE — Progress Notes (Signed)
Physical Therapy Treatment Patient Details Name: Alvin Benton MRN: [id number] DOB: August 18, 1951 Today's Date: 08/01/2023   History of Present Illness Pt is 72 yo presenting for posterior decompression at L3-4 and L 4-5. Semi hemilaminectomies performed on R /L. PMH: A fib, CHF, COPD, diverticulitis, GERD, heart murmur, hepatic steatosis, HTN, IBS, migraine, nocturia, SIADH.    PT Comments  Pt is slowly progressing towards goals. Pt was able to increase gait distance this session before fatiguing with improved gait sequencing with intermittent verbal cues.  Pt significantly improved bed mobility with log roll technique to supervision/CGA. Pt continues at Total A for sit to stand and gait requiring 2 + assist. Due to pt current functional status, home set up and available assistance at home recommending skilled physical therapy services < 3 hours/day on discharge from acute care hospital setting in order to decrease risk for falls, injury, immobility and re-hospitalization. Pt was limited by pain and fatigue this session.      If plan is discharge home, recommend the following: Two people to help with walking and/or transfers;Assist for transportation;Help with stairs or ramp for entrance;Assistance with cooking/housework   Can travel by private vehicle     No  Equipment Recommendations  None recommended by PT       Precautions / Restrictions Precautions Precautions: Back Precaution Booklet Issued: No Precaution Comments: No brace per orders Restrictions Weight Bearing Restrictions: No     Mobility  Bed Mobility Overal bed mobility: Needs Assistance Bed Mobility: Rolling, Sidelying to Sit Rolling: Contact guard assist Sidelying to sit: Supervision       General bed mobility comments: increased time, less cueing required.    Transfers Overall transfer level: Needs assistance Equipment used: Rolling walker (2 wheels) Transfers: Sit to/from Stand Sit to Stand: Mod assist, +2  physical assistance, From elevated surface                Ambulation/Gait Ambulation/Gait assistance: Mod assist, +2 physical assistance, +2 safety/equipment Gait Distance (Feet): 15 Feet Assistive device: Rolling walker (2 wheels) Gait Pattern/deviations: Step-to pattern, Decreased step length - right, Decreased step length - left, Shuffle, Wide base of support, Decreased dorsiflexion - left Gait velocity: decreased with quick foot movements intermittently Gait velocity interpretation: <1.31 ft/sec, indicative of household ambulator   General Gait Details: improved weight shifting today from last session. Pt requires verbal cues to slow down and sequence gait in order to decrease shuffle.      Balance Overall balance assessment: Needs assistance Sitting-balance support: Bilateral upper extremity supported, Feet supported Sitting balance-Leahy Scale: Fair     Standing balance support: Bilateral upper extremity supported, Reliant on assistive device for balance Standing balance-Leahy Scale: Poor        Cognition Arousal: Alert Behavior During Therapy: WFL for tasks assessed/performed Overall Cognitive Status: Within Functional Limits for tasks assessed           General Comments General comments (skin integrity, edema, etc.): Inision covered, dry      Pertinent Vitals/Pain Pain Assessment Pain Assessment: Faces Faces Pain Scale: Hurts little more Pain Location: Incision pain Pain Descriptors / Indicators: Aching, Sore, Spasm Pain Intervention(s): Limited activity within patient's tolerance, Monitored during session     PT Goals (current goals can now be found in the care plan section) Acute Rehab PT Goals Patient Stated Goal: to walk PT Goal Formulation: With patient Time For Goal Achievement: 08/14/23 Potential to Achieve Goals: Good Progress towards PT goals: Progressing toward goals    Frequency  Min 1X/week      PT Plan  Continue with current  POC       AM-PAC PT "6 Clicks" Mobility   Outcome Measure  Help needed turning from your back to your side while in a flat bed without using bedrails?: A Little Help needed moving from lying on your back to sitting on the side of a flat bed without using bedrails?: A Little Help needed moving to and from a bed to a chair (including a wheelchair)?: Total Help needed standing up from a chair using your arms (e.g., wheelchair or bedside chair)?: Total Help needed to walk in hospital room?: Total Help needed climbing 3-5 steps with a railing? : Total 6 Click Score: 10    End of Session Equipment Utilized During Treatment: Gait belt Activity Tolerance: Patient tolerated treatment well;Patient limited by fatigue Patient left: in chair;with call bell/phone within reach;with chair alarm set Nurse Communication: Mobility status PT Visit Diagnosis: Other abnormalities of gait and mobility (R26.89)     Time: 7829-5621 PT Time Calculation (min) (ACUTE ONLY): 13 min  Charges:    $Gait Training: 8-22 mins PT General Charges $$ ACUTE PT VISIT: 1 Visit                    Alvin Benton, Alvin Benton, Alvin Benton  Acute Rehabilitation Services Office: [phone] (Secure chat preferred)    Claudia Desanctis 08/01/2023, 3:24 PM

## 2023-08-01 NOTE — Discharge Summary (Signed)
Physician Discharge Summary  Patient ID: Alvin Benton MRN: [id number] DOB/AGE: [birth year] 70 y.o.  Admit date: 07/30/2023 Discharge date: 08/01/2023  Admission Diagnoses:lumbar  stenosis L3/4,4/5 Neurogenic claudication due to stenosis  Discharge Diagnoses:  Principal Problem:   Lumbar stenosis with neurogenic claudication   Discharged Condition: good  Hospital Course: Alvin Benton was admitted and taken to the operating room where he underwent bilateral semihemilaminectomies and foraminotomies at L3, L4, L5 sparing the supraspinous ligament and spinous processes. He is ambulating, voiding, and tolerating a regular diet. His wound is clean, dry, no signs of infection  Treatments: surgery: as above  Discharge Exam: Blood pressure 110/61, pulse 75, temperature (!) 97.4 F (36.3 C), temperature source Oral, resp. rate 18, height 5\' 9"  (1.753 m), weight 85.3 kg, SpO2 96%. General appearance: appears stated age and no distress  Disposition: Discharge disposition: 03-Skilled Nursing Facility      Stenosis  Allergies as of 08/01/2023   No Known Allergies      Medication List     TAKE these medications    acetaminophen 325 MG tablet Commonly known as: TYLENOL Take 650 mg by mouth every 4 (four) hours as needed for mild pain or moderate pain.   albuterol 108 (90 Base) MCG/ACT inhaler Commonly known as: VENTOLIN HFA Inhale 2 puffs into the lungs every 6 (six) hours as needed for wheezing or shortness of breath.   ALPRAZolam 0.5 MG tablet Commonly known as: XANAX Take 0.5 mg by mouth 2 (two) times daily.   amLODipine 5 MG tablet Commonly known as: NORVASC Take 1 tablet (5 mg total) by mouth daily.   ammonium lactate 12 % lotion Commonly known as: LAC-HYDRIN Apply 1 Application topically every 12 (twelve) hours as needed (apply to bilat arms topically for brusing).   atorvastatin 20 MG tablet Commonly known as: LIPITOR Take 20 mg by mouth at bedtime.   carvedilol  12.5 MG tablet Commonly known as: COREG Take 1 tablet (12.5 mg total) by mouth 2 (two) times daily with a meal.   cholecalciferol 25 MCG (1000 UNIT) tablet Commonly known as: VITAMIN D3 Take 3,000 Units by mouth daily.   Excedrin Tension Headache 500-65 MG Tabs per tablet Generic drug: acetaminophen-caffeine Take 2 tablets by mouth daily.   fexofenadine 180 MG tablet Commonly known as: ALLEGRA Take 180 mg by mouth daily.   fluticasone 50 MCG/ACT nasal spray Commonly known as: FLONASE Place 1 spray into both nostrils daily.   furosemide 40 MG tablet Commonly known as: Lasix Take 1 tablet (40 mg total) by mouth daily.   gabapentin 300 MG capsule Commonly known as: NEURONTIN Take 300 mg by mouth 3 (three) times daily.   hydrochlorothiazide 25 MG tablet Commonly known as: HYDRODIURIL Take 1 tablet (25 mg total) by mouth daily.   lidocaine 5 % Commonly known as: LIDODERM Place 1 patch onto the skin every 12 (twelve) hours as needed (pain). Remove & Discard patch within 12 hours or as directed by MD   losartan 25 MG tablet Commonly known as: COZAAR Take 25 mg by mouth every evening.   multivitamin with minerals tablet Take 1 tablet by mouth daily.   ondansetron 4 MG tablet Commonly known as: ZOFRAN Take 4 mg by mouth every 8 (eight) hours as needed for nausea or vomiting.   oxyCODONE 5 MG immediate release tablet Commonly known as: Oxy IR/ROXICODONE Take 1 tablet (5 mg total) by mouth every 6 (six) hours as needed for up to 8 days for moderate pain ((  score 4 to 6)).   polyethylene glycol 17 g packet Commonly known as: MIRALAX / GLYCOLAX Take 17 g by mouth daily. In liquid   Polypodium Leucotomos 240 MG Caps Take 480 mg by mouth daily.   Potassium Chloride ER 20 MEQ Tbcr Take 20 mEq by mouth daily.   sertraline 25 MG tablet Commonly known as: ZOLOFT Take 25 mg by mouth daily.   SUNSCREENS EX Apply 1 application  topically See admin instructions. Apply to  resident exposed skin before going outside in the sun   tiZANidine 4 MG tablet Commonly known as: ZANAFLEX Take 1 tablet (4 mg total) by mouth every 6 (six) hours as needed for muscle spasms.   trolamine salicylate 10 % cream Commonly known as: ASPERCREME Apply 1 Application topically every 6 (six) hours as needed for muscle pain (Apply to lower back for amall amount to cover area).   umeclidinium-vilanterol 62.5-25 MCG/ACT Aepb Commonly known as: ANORO ELLIPTA Inhale 1 puff into the lungs daily.        Follow-up Information     Coletta Memos, MD Follow up.   Specialty: Neurosurgery Why: keep your scheduled appointment Contact information: [address] [place] [address] [phone]                 Signed: Coletta Memos 08/01/2023, 1:08 PM

## 2023-08-01 NOTE — Discharge Instructions (Signed)
Lumbar Laminectomy Care After A laminectomy is an operation performed on the spine. The purpose is to decompress the spinal cord and/or the nerve roots.  The time in surgery depends on the findings in surgery and what is necessary to correct the problems. HOME CARE INSTRUCTIONS   Check the cut (incision) made by the surgeon twice a day for signs of infection. Some signs of infection may include:   A foul smelling, greenish or yellowish discharge from the wound.   Increased pain.   Increased redness over the incision (operative) site.   The skin edges may separate.   Flu-like symptoms (problems).   A temperature above 101.5 F (38.6 C).   Change your bandages in about 24 to 36 hours following surgery or as directed.   You may shower tomorrow. Avoid bathtubs, swimming pools and hot tubs for three weeks or until your incision has healed completely. If you have stitches or staples, they may be removed 2 to 3 weeks after surgery, or as directed by your doctor. This may be done by your doctor or caregiver.   You may walk as much as you like. No need to exercise at this time. Limit lifting to ~10lbs.  Weight reduction may be beneficial if you are overweight.   Daily exercise is helpful to prevent the return of problems. Walking is permitted. You may use a treadmill without an incline. Cut down on activities and exercise if you have discomfort. You may also go up and down stairs as much as you can tolerate.   DO NOT lift anything heavier than 10 . Avoid bending or twisting at the waist. Always bend your knees when lifting.   Maintain strength and range of motion as instructed.   Do not drive for 2 to 3 weeks, or as directed by your doctors. You may be a passenger for 20 to 30 minute trips. Lying back in the passenger seat may be more comfortable for you. Always wear a seatbelt.   Limit your sitting in a regular chair to 20 to 30 minutes at a time. There are no limitations for sitting in a  recliner. You should lie down or walk in between sitting periods.   Only take over-the-counter or prescription medicines for pain, discomfort, or fever as directed by your caregiver.  SEEK MEDICAL CARE IF:   There is increased bleeding (more than a small spot) from the wound.   You notice redness, swelling, or increasing pain in the wound.   Pus is coming from wound.   You develop an unexplained oral temperature above 102 F (38.9 C) develops.   You notice a foul smell coming from the wound or dressing.   You have increasing pain in your wound.  SEEK IMMEDIATE MEDICAL CARE IF:   You develop a rash.   You have difficulty breathing.   You develop any allergic problems to medicines given.   
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
## 2023-08-21 NOTE — Op Note (Signed)
07/30/2023  8:31 PM  PATIENT:  Verna Czech Pepin  72 y.o. male  PRE-OPERATIVE DIAGNOSIS:  Stenosis L3/4, 4/5 POST-OPERATIVE DIAGNOSIS:  Stenosis same PROCEDURE:  Procedure(s): Sublaminar decompression,  Lumbar three-Lumbar four - Lumbar four-Lumbar five - bilateral  SURGEON: Surgeon(s): Coletta Memos, MD  ASSISTANTS:none  ANESTHESIA:   general  EBL:  No intake/output data recorded.  BLOOD ADMINISTERED:none  CELL SAVER GIVEN:none  COUNT:per nursing  DRAINS: none   SPECIMEN:  No Specimen  DICTATION: LATRE LEATHERS was taken to the operating room, intubated, and placed under a general anesthetic without difficulty. he was positioned prone on the wilson frame with all pressure points padded. His lumbar region was prepped and draped in a sterile manner.  I infiltrated the lumbar incision with lidocaine. I opened the incision with a 10 blade and dissected sharply to expose the L3,4, and 5 lamina.  I used the drill and Kerrison punches to perform laminectomies of L3, L4, and hemilaminectomy of L5. I removed the ligamentum flavum and decompressed the spinal canal from L3-L5. I used the punches to expand the decompression in the lateral aspect of the canal. Partial facetectomies of L3/4, and L4/5 were performed to decompress the lateral recesses.  I irrigated the wound and then closed. I achieved hemostasis prior to closure.  I approximated the thoracolumbar fascia, the subcutaneous and subcuticular planes with vicryl sutures. I applied a sterile dressing using dermabond.  Mr. Lafontaine was rolled onto the OR stretcher then extubated moving all extremities.  PLAN OF CARE: Admit for overnight observation  PATIENT DISPOSITION:  PACU - hemodynamically stable.   Delay start of Pharmacological VTE agent (>24hrs) due to surgical blood loss or risk of bleeding:  yes

## 2023-11-17 ENCOUNTER — Encounter: Payer: Self-pay | Admitting: Internal Medicine

## 2024-06-18 ENCOUNTER — Ambulatory Visit: Admitting: Neurology
